# Patient Record
Sex: Female | Born: 1945 | Race: Black or African American | Hispanic: No | Smoking: Never smoker
Health system: Southern US, Community
[De-identification: ages and names within clinical notes are randomized; demographics above are authoritative.]

## PROBLEM LIST (undated history)

## (undated) DIAGNOSIS — I10 Essential (primary) hypertension: Secondary | ICD-10-CM

## (undated) DIAGNOSIS — E119 Type 2 diabetes mellitus without complications: Secondary | ICD-10-CM

## (undated) DIAGNOSIS — J383 Other diseases of vocal cords: Secondary | ICD-10-CM

## (undated) HISTORY — PX: NO PAST SURGERIES: SHX2092

---

## 1998-01-10 ENCOUNTER — Ambulatory Visit (HOSPITAL_COMMUNITY): Admission: RE | Admit: 1998-01-10 | Discharge: 1998-01-10 | Payer: Self-pay | Admitting: *Deleted

## 1998-01-14 ENCOUNTER — Ambulatory Visit (HOSPITAL_COMMUNITY): Admission: RE | Admit: 1998-01-14 | Discharge: 1998-01-14 | Payer: Self-pay | Admitting: *Deleted

## 1998-01-30 ENCOUNTER — Emergency Department (HOSPITAL_COMMUNITY): Admission: EM | Admit: 1998-01-30 | Discharge: 1998-01-30 | Payer: Self-pay | Admitting: Internal Medicine

## 1998-07-16 ENCOUNTER — Ambulatory Visit (HOSPITAL_COMMUNITY): Admission: RE | Admit: 1998-07-16 | Discharge: 1998-07-16 | Payer: Self-pay | Admitting: *Deleted

## 1998-08-29 ENCOUNTER — Other Ambulatory Visit: Admission: RE | Admit: 1998-08-29 | Discharge: 1998-08-29 | Payer: Self-pay | Admitting: Obstetrics and Gynecology

## 1998-09-17 ENCOUNTER — Ambulatory Visit (HOSPITAL_COMMUNITY): Admission: RE | Admit: 1998-09-17 | Discharge: 1998-09-17 | Payer: Self-pay | Admitting: Obstetrics and Gynecology

## 1998-09-17 ENCOUNTER — Encounter: Payer: Self-pay | Admitting: Obstetrics and Gynecology

## 1998-10-10 ENCOUNTER — Ambulatory Visit (HOSPITAL_COMMUNITY): Admission: RE | Admit: 1998-10-10 | Discharge: 1998-10-10 | Payer: Self-pay | Admitting: Gastroenterology

## 1999-01-30 ENCOUNTER — Ambulatory Visit (HOSPITAL_COMMUNITY): Admission: RE | Admit: 1999-01-30 | Discharge: 1999-01-30 | Payer: Self-pay | Admitting: Obstetrics and Gynecology

## 1999-05-28 ENCOUNTER — Encounter: Payer: Self-pay | Admitting: Obstetrics and Gynecology

## 1999-05-28 ENCOUNTER — Ambulatory Visit (HOSPITAL_COMMUNITY): Admission: RE | Admit: 1999-05-28 | Discharge: 1999-05-28 | Payer: Self-pay | Admitting: Obstetrics and Gynecology

## 1999-09-04 ENCOUNTER — Other Ambulatory Visit: Admission: RE | Admit: 1999-09-04 | Discharge: 1999-09-04 | Payer: Self-pay | Admitting: Obstetrics and Gynecology

## 2000-06-02 ENCOUNTER — Ambulatory Visit (HOSPITAL_COMMUNITY): Admission: RE | Admit: 2000-06-02 | Discharge: 2000-06-02 | Payer: Self-pay | Admitting: Obstetrics and Gynecology

## 2000-06-02 ENCOUNTER — Encounter: Payer: Self-pay | Admitting: Obstetrics and Gynecology

## 2000-09-22 ENCOUNTER — Other Ambulatory Visit: Admission: RE | Admit: 2000-09-22 | Discharge: 2000-09-22 | Payer: Self-pay | Admitting: Obstetrics and Gynecology

## 2001-06-08 ENCOUNTER — Ambulatory Visit (HOSPITAL_COMMUNITY): Admission: RE | Admit: 2001-06-08 | Discharge: 2001-06-08 | Payer: Self-pay | Admitting: Obstetrics and Gynecology

## 2001-06-08 ENCOUNTER — Encounter: Payer: Self-pay | Admitting: Obstetrics and Gynecology

## 2001-09-26 ENCOUNTER — Other Ambulatory Visit: Admission: RE | Admit: 2001-09-26 | Discharge: 2001-09-26 | Payer: Self-pay | Admitting: Obstetrics and Gynecology

## 2001-11-23 ENCOUNTER — Ambulatory Visit (HOSPITAL_COMMUNITY): Admission: RE | Admit: 2001-11-23 | Discharge: 2001-11-23 | Payer: Self-pay | Admitting: Gastroenterology

## 2002-06-14 ENCOUNTER — Ambulatory Visit (HOSPITAL_COMMUNITY): Admission: RE | Admit: 2002-06-14 | Discharge: 2002-06-14 | Payer: Self-pay | Admitting: Obstetrics and Gynecology

## 2002-06-14 ENCOUNTER — Encounter: Payer: Self-pay | Admitting: Obstetrics and Gynecology

## 2002-10-18 ENCOUNTER — Other Ambulatory Visit: Admission: RE | Admit: 2002-10-18 | Discharge: 2002-10-18 | Payer: Self-pay | Admitting: Obstetrics and Gynecology

## 2003-03-05 ENCOUNTER — Encounter: Payer: Self-pay | Admitting: Emergency Medicine

## 2003-03-05 ENCOUNTER — Emergency Department (HOSPITAL_COMMUNITY): Admission: EM | Admit: 2003-03-05 | Discharge: 2003-03-05 | Payer: Self-pay | Admitting: Emergency Medicine

## 2003-06-20 ENCOUNTER — Encounter: Admission: RE | Admit: 2003-06-20 | Discharge: 2003-06-20 | Payer: Self-pay | Admitting: Obstetrics and Gynecology

## 2003-06-27 ENCOUNTER — Ambulatory Visit (HOSPITAL_COMMUNITY): Admission: RE | Admit: 2003-06-27 | Discharge: 2003-06-27 | Payer: Self-pay | Admitting: Obstetrics and Gynecology

## 2003-07-11 ENCOUNTER — Ambulatory Visit (HOSPITAL_COMMUNITY): Admission: RE | Admit: 2003-07-11 | Discharge: 2003-07-11 | Payer: Self-pay | Admitting: Obstetrics and Gynecology

## 2003-07-17 ENCOUNTER — Ambulatory Visit: Admission: RE | Admit: 2003-07-17 | Discharge: 2003-07-17 | Payer: Self-pay | Admitting: Gynecology

## 2003-08-15 ENCOUNTER — Inpatient Hospital Stay (HOSPITAL_COMMUNITY): Admission: RE | Admit: 2003-08-15 | Discharge: 2003-08-17 | Payer: Self-pay | Admitting: Obstetrics and Gynecology

## 2003-08-15 ENCOUNTER — Encounter (INDEPENDENT_AMBULATORY_CARE_PROVIDER_SITE_OTHER): Payer: Self-pay | Admitting: Specialist

## 2004-07-02 ENCOUNTER — Ambulatory Visit (HOSPITAL_COMMUNITY): Admission: RE | Admit: 2004-07-02 | Discharge: 2004-07-02 | Payer: Self-pay | Admitting: Obstetrics and Gynecology

## 2005-02-25 ENCOUNTER — Other Ambulatory Visit: Admission: RE | Admit: 2005-02-25 | Discharge: 2005-02-25 | Payer: Self-pay | Admitting: Family Medicine

## 2005-07-15 ENCOUNTER — Ambulatory Visit (HOSPITAL_COMMUNITY): Admission: RE | Admit: 2005-07-15 | Discharge: 2005-07-15 | Payer: Self-pay | Admitting: Family Medicine

## 2006-07-21 ENCOUNTER — Ambulatory Visit (HOSPITAL_COMMUNITY): Admission: RE | Admit: 2006-07-21 | Discharge: 2006-07-21 | Payer: Self-pay | Admitting: Family Medicine

## 2006-12-17 ENCOUNTER — Ambulatory Visit (HOSPITAL_COMMUNITY): Admission: RE | Admit: 2006-12-17 | Discharge: 2006-12-17 | Payer: Self-pay | Admitting: Gastroenterology

## 2006-12-17 ENCOUNTER — Encounter (INDEPENDENT_AMBULATORY_CARE_PROVIDER_SITE_OTHER): Payer: Self-pay | Admitting: Gastroenterology

## 2007-02-02 ENCOUNTER — Ambulatory Visit (HOSPITAL_COMMUNITY): Admission: RE | Admit: 2007-02-02 | Discharge: 2007-02-02 | Payer: Self-pay | Admitting: Gastroenterology

## 2007-06-20 ENCOUNTER — Ambulatory Visit (HOSPITAL_BASED_OUTPATIENT_CLINIC_OR_DEPARTMENT_OTHER): Admission: RE | Admit: 2007-06-20 | Discharge: 2007-06-20 | Payer: Self-pay | Admitting: Otolaryngology

## 2007-06-20 ENCOUNTER — Encounter (INDEPENDENT_AMBULATORY_CARE_PROVIDER_SITE_OTHER): Payer: Self-pay | Admitting: Otolaryngology

## 2007-07-20 ENCOUNTER — Encounter: Admission: RE | Admit: 2007-07-20 | Discharge: 2007-07-26 | Payer: Self-pay | Admitting: Otolaryngology

## 2007-07-27 ENCOUNTER — Ambulatory Visit (HOSPITAL_COMMUNITY): Admission: RE | Admit: 2007-07-27 | Discharge: 2007-07-27 | Payer: Self-pay | Admitting: Family Medicine

## 2008-04-25 ENCOUNTER — Other Ambulatory Visit: Admission: RE | Admit: 2008-04-25 | Discharge: 2008-04-25 | Payer: Self-pay | Admitting: Family Medicine

## 2008-08-01 ENCOUNTER — Ambulatory Visit (HOSPITAL_COMMUNITY): Admission: RE | Admit: 2008-08-01 | Discharge: 2008-08-01 | Payer: Self-pay | Admitting: Family Medicine

## 2009-08-20 ENCOUNTER — Ambulatory Visit (HOSPITAL_COMMUNITY): Admission: RE | Admit: 2009-08-20 | Discharge: 2009-08-20 | Payer: Self-pay | Admitting: Family Medicine

## 2010-07-18 ENCOUNTER — Other Ambulatory Visit (HOSPITAL_COMMUNITY): Payer: Self-pay | Admitting: Family Medicine

## 2010-07-18 DIAGNOSIS — Z1231 Encounter for screening mammogram for malignant neoplasm of breast: Secondary | ICD-10-CM

## 2010-07-18 DIAGNOSIS — Z0000 Encounter for general adult medical examination without abnormal findings: Secondary | ICD-10-CM

## 2010-09-01 ENCOUNTER — Ambulatory Visit (HOSPITAL_COMMUNITY): Admission: RE | Admit: 2010-09-01 | Payer: Self-pay | Source: Ambulatory Visit

## 2010-09-23 ENCOUNTER — Ambulatory Visit (HOSPITAL_COMMUNITY)
Admission: RE | Admit: 2010-09-23 | Discharge: 2010-09-23 | Disposition: A | Discharge status: Home / Self Care | Payer: Medicare Other | Source: Ambulatory Visit | Attending: Family Medicine | Admitting: Family Medicine

## 2010-09-23 DIAGNOSIS — Z1231 Encounter for screening mammogram for malignant neoplasm of breast: Secondary | ICD-10-CM | POA: Insufficient documentation

## 2010-11-11 NOTE — Op Note (Signed)
Tracy Fisher, Tracy Fisher               ACCOUNT NO.:  [id number]   MEDICAL RECORD NO.:  [id number]          PATIENT TYPE:  AMB   LOCATION:  DSC                          FACILITY:  [facility]   PHYSICIAN:  Antony Contras, MD     DATE OF BIRTH:  1946-02-08   DATE OF PROCEDURE:  06/20/2007  DATE OF DISCHARGE:  06/20/2007                               OPERATIVE REPORT   PREOPERATIVE DIAGNOSES:  1. Hoarseness.  2. Left vocal fold cyst.   POSTOPERATIVE DIAGNOSES:  1. Hoarseness.  2. Left vocal fold cyst.   PROCEDURE:  Suspended micro direct laryngoscopy with a micro flap  excision of left vocal fold cyst.   SURGEON:  Dr. Christia Reading.   ANESTHESIA:  General endotracheal anesthesia.   COMPLICATIONS:  None.   INDICATIONS:  The patient is a 65 year old African-American female who  has had a hoarse voice for a couple of years.  She has been treated with  antireflux therapy with minimal improvement.  She recently underwent  stroboscopy that defined a left vocal fold cyst.  She presents to the  operating room for surgical management.   FINDINGS:  There is a lump within the left vocal fold in the midportion  of the striking zone and inferior to this.  There is a little bit of  edema of the opposite vocal fold.  However, no lump in the right vocal  fold.  Upon micro flap dissection, the cyst was unable to be freed from  the overlying mucosa, and that mucosa was removed along with the cyst.   DESCRIPTION OF PROCEDURE:  The patient was identified in the holding  room, and informed consent having been obtained including discussion of  risks, benefits, and alternatives, the patient was brought to the  operative suite and put on the operative table in supine position.  Anesthesia was induced, and the patient was intubated by anesthesia team  without difficulty after spraying with topical lidocaine.  With the eyes  draped closed, the bed was turned 90 degrees from anesthesia.  A head  wrap was placed  around the patient's head, and a tooth guard was placed.  The larynx was then exposed using a Dedo laryngoscope was placed in a  supraglottic position.  The laryngoscope was placed in suspension on a  Mayo stand.  The patient was given intravenous antibiotics and steroids  during the case.  A 0-degree telescope then used to document the larynx.  An epinephrine soaked pledget was held against the left vocal fold for a  minute or so and then removed.  A incision was then made along on the  left vocal fold over the central portion of the vocal fold on its  superior surface.  Dissection was then performed with a blunt probe  underneath the mucosa toward the cyst.  Attempts were made to separate  the cyst from the overlying mucosa, but this was impossible.  The cyst  was separated from the underlying lamina propria with blunt dissection.  Scissors were used to attempt to separate the cyst from the overlying  mucosa but resulted in another  incision through the mucosa against the  cyst.  The cyst was then retracted medially while the incision was then  extended around the cyst through the mucosa removing the cyst.  The cyst  was then passed to nursing for pathology.  An epinephrine-soaked pledget  was held again against the larynx for a minute or so and then removed.  The probe was then used to palpate the larynx, and no additional lump  was felt.  A 0-degree telescope was again used to document the larynx.  After this, the laryngoscope was taken out of suspension and removed  from the patient's mouth while the airway was suctioned.  The patient  was then turned back to anesthesia for waking and was extubated and  moved to the recovery room in stable condition.      Antony Contras, MD  Electronically Signed     DDB/MEDQ  D:  06/20/2007  T:  06/21/2007  Job:  [id number]

## 2010-11-11 NOTE — Op Note (Signed)
NAME:  Tracy Fisher, Tracy Fisher               ACCOUNT NO.:  [id number]   MEDICAL RECORD NO.:  [id number]          PATIENT TYPE:  AMB   LOCATION:  ENDO                         FACILITY:  [facility]   PHYSICIAN:  Anselmo Rod, M.D.  DATE OF BIRTH:  [birth year]   DATE OF PROCEDURE:  12/17/2006  DATE OF DISCHARGE:                               OPERATIVE REPORT   PROCEDURE PERFORMED:  Esophagogastroduodenoscopy with gastric biopsies  x3.   ENDOSCOPIST:  Anselmo Rod, MD   INSTRUMENT USED:  Pentax video panendoscope.   INDICATIONS FOR PROCEDURE:  [age]-year-old African American female  with a history of hoarseness and reflux, undergoing EGD to rule out  esophagitis, peptic ulcer disease, etc.   PREPROCEDURE PREPARATION:  Informed consent was procured from the  patient.  The patient fasted for 8 hours prior to the procedure.  The  risks and benefits of the procedure were discussed with the patient in  great detail.   PREPROCEDURE PHYSICAL:  VITAL SIGNS:  The patient had stable vital  signs.  NECK:  Supple.  CHEST:  Clear to auscultation.  CARDIAC:  S1 and S2 regular.  ABDOMEN:  Soft with normal bowel sounds.   DESCRIPTION OF THE PROCEDURE:  The patient was placed in the left  lateral decubitus position and sedated with 25 mcg of Fentanyl and 2.5  mg of Versed in addition to the sedation she received for the  colonoscopy. Once the patient was adequately sedated and maintained on  low-flow oxygen and continuous cardiac monitoring, the Pentax video  panendoscope was advanced through the mouthpiece, over the tongue and  into the esophagus under direct vision.  The entire esophagus was widely  patent with no evidence of ring, stricture, mass, esophagitis or  Barrett's mucosa. The Z-line appeared healthy. The scope was then  advanced into the stomach.  Mild antral gastritis was noted.  Biopsies  were done to rule out presence of H. pylori by Pathology.  A small  sessile polyp was biopsied  below the Z-line.  The proximal small bowel  appeared normal.  There was no obstruction.  The patient tolerated the  procedure well without complications.   IMPRESSION:  1. Widely patent esophagus with no evidence of esophagitis or      Barrett's mucosa, healthy Z-line.  2. Small polyp biopsied below the Z-line.  3. Mild antral gastritis, biopsies done for Helicobacter pylori.  4. Normal proximal small bowel.   RECOMMENDATIONS:  1. Await pathology results.  2. Avoid all nonsteroidals including aspirin for the next 4 weeks.  3. Continue PPIs.  4. Outpatient followup as need arises in the future.      Anselmo Rod, M.D.  Electronically Signed     JNM/MEDQ  D:  12/17/2006  T:  12/17/2006  Job:  [id number]   cc:   Stacie Acres. Cliffton Asters, M.D.  Fax: [phone]   Antony Contras, MD  Fax: [phone]

## 2010-11-11 NOTE — Op Note (Signed)
NAME:  Tracy Fisher, Tracy Fisher               ACCOUNT NO.:  [id number]   MEDICAL RECORD NO.:  [id number]          PATIENT TYPE:  AMB   LOCATION:  ENDO                         [facility]ACILITY:  [facility]   PHYSICIAN:  Anselmo Rod, M.D.  DATE OF BIRTH:  May 25, 1946   DATE OF PROCEDURE:  12/17/2006  DATE OF DISCHARGE:                               OPERATIVE REPORT   PROCEDURE PERFORMED:  Colonoscopy with cold biopsies times four.   ENDOSCOPIST:  Anselmo Rod, M.D.   INSTRUMENT USED:  Pentax video colonoscope.   INDICATION FOR PROCEDURE:  A 65 year old African-American female with a  history of adenomatous polyps removed in the past, undergoing a  surveillance colonoscopy to rule out recurrent polyps, masses, etc.   PREPROCEDURE PREPARATION:  Informed consent was procured from the  patient.  The patient fasted for 8 hours prior to the procedure and  prepped with a bottle of magnesium citrate and a gallon of NuLYTELY the  night prior to the procedure.  Risks and benefits of the procedure,  including a 10% miss rate of cancer and polyp, were discussed with the  patient as well.   PREPROCEDURE PHYSICAL:  The patient had stable vital signs.  NECK:  Supple.  CHEST:  Clear to auscultation.  S1, S2 regular.  ABDOMEN:  Soft with normal bowel sounds.   DESCRIPTION OF THE PROCEDURE:  The patient was placed in the left  lateral decubitus position, sedated with 75 mcg of Fentanyl, 8 mg of  Versed given intravenously in slow incremental doses.  Once the patient  was adequately sedated and maintained on low flow oxygen and continuous  cardiac monitoring the Pentax video colonoscope was advanced from the  rectum to the cecum.  There was some residual stool in the right colon,  multiple washings were done.  Small lesions could be missed.  The  appendiceal orifice and ileocecal valve were visualized.  After changing  the patient's position from the left lateral to the supine position, the  terminal ileum was  briefly visualized and appeared normal.  There was  patchy erythema noted in the rectum and rectosigmoid colon, biopsies  were done to rule out proctitis; the exact reason for this erythema is  not clear to me.  Retroflexion of the rectum revealed erythema, as  mentioned above, but no evidence of hemorrhoids.  The patient tolerated  the procedure well without complications, no masses or polyps were seen,  there was no evidence of diverticulosis.   IMPRESSION:  1. Patchy erythema noted in the rectum and rectosigmoid colon biopsy      done to rule out proctitis, otherwise normal exam to the terminal      ileum.  2. Large amount of residual stool especially in the right colon;      multiple washings done;, small lesion could be missed.   RECOMMENDATIONS:  1. Await pathology results.  2. Avoid all nonsteroidals, including Aspirin, for the next 4 weeks.  3. Repeat colonoscopy depending on pathology results.  4. Proceed with EGD at this time.      Anselmo Rod, M.D.  Electronically Signed  JNM/MEDQ  D:  12/17/2006  T:  12/17/2006  Job:  [id number]   cc:   Stacie Acres. Cliffton Asters, M.D.  Fax: [phone]

## 2010-11-14 NOTE — H&P (Signed)
NAME:  Tracy Fisher, Tracy Fisher                         ACCOUNT NO.:  [id number]   MEDICAL RECORD NO.:  [id number]                   PATIENT TYPE:  INP   LOCATION:  NA                                   FACILITY:  [facility]   PHYSICIAN:  Maxie Better, M.D.            DATE OF BIRTH:  [birth year]   DATE OF ADMISSION:  08/15/2003  DATE OF DISCHARGE:                                HISTORY & PHYSICAL   CHIEF COMPLAINT:  Abdominal soreness, uterine fibroids, for total abdominal  hysterectomy and bilateral salpingo-oophorectomy.   HISTORY OF PRESENT ILLNESS:  This is a 65 year old gravida 2, para 2,  postmenopausal white female, now being admitted for total abdominal  hysterectomy, bilateral salpingo-oophorectomy.  The patient presented with  complaint of inability to do situps due to her abdomen and lower abdominal  soreness.  She had no associated nausea, vomiting, or fever.  She had had a  colonoscopy in 2003 that was normal.  The patient felt something was wrong,  and this was in December of 2004.  At that time, she underwent an ultrasound  previously in April of 2004, which showed multiple uterine fibroids, both  ovaries were normal, and a thin endometrial stripe.  The patient was offered  an abdominopelvic CT scan which she underwent in December of 2004 for  persistent symptoms.  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  Multiple dense lesions in the liver, consistent with  cysts, and MRI of the pelvis was recommended.  The patient was referred to  Dr. Stanford Breed, who evaluated her concerns, as well as review of her  studies.  Without the possibility of cancer, however, the cause of the  patient's symptoms is unknown, and the patient wants to proceed with removal  of her pelvic organs.  The patient now, therefore, presents for surgical  management.   ALLERGIES:  To SULFA.   MEDICATIONS:   None.   MEDICAL HISTORY:  Prior history of hypercholesterolemia, history of  adenomatous colonic polyp in 2000.   SURGICAL HISTORY:  D & C hysteroscopy, endometrial polypectomy in 2000,  postpartum tubal ligation in October of 1979.   OBSTETRICAL HISTORY:  Vaginal delivery x2.   FAMILY HISTORY:  Bladder cancer in father, diabetes in mother.  Mother also  had thyroid dysfunction.  No colon or ovarian cancer in the family.   SOCIAL HISTORY:  Nonsmoker.  Two children. Works at [employer]   REVIEW OF SYSTEMS:  Negative except as noted in the history of present  illness.   PHYSICAL EXAMINATION:  GENERAL:  Well-developed, well-nourished black female  in no acute distress.  VITAL SIGNS:  Blood pressure 112/70, temperature 97.8, weight is 155-1/2  pounds.  SKIN:  Shows no lesions.  HEENT:  Anicteric sclerae. Pink conjunctivae.  Oropharynx negative.  HEART:  Regular rate and rhythm without  murmur.  LUNGS:  Clear to auscultation.  NECK:  Supple. No thyroid palpable.  No axillary, supraclavicular, or  inguinal nodes palpable.  BREASTS:  Soft and nontender.  No palpable mass. Nipples without discharge.  ABDOMEN:  Soft, small striae, nontender.  Slightly obese.  PELVIC:  Vulva shows no lesions.  Vagina:  There are some slight atrophic  changes.  Pale mucosa.  Cervix is closed.  Uterus is anteverted, about 7  weeks size.  Adnexa:  nontender.  No palpable mass.  EXTREMITIES:  Positive varicosities, no calf tenderness, no edema.   IMPRESSION:  Lower abdominal pain of unclear etiology.  Uterine fibroids.   PLAN:  Exploratory laparotomy, total abdominal hysterectomy, bilateral  salpingo-oophorectomy with Dr. Stanford Breed on standby.  Bowel prep.  Antibiotic prophylaxis.  Antiembolic stockings.  The risks of the procedure  have been explained to the patient, including but not limited to infection,  bleeding which may require transfusion, injury to surrounding organs and   structures such as the bladder, bowel, and ureter, internal scar tissues,  and a fissure which may result in bowel obstruction, pelvic pain, fistula  formation, possibility that her discomfort is not relieved by the surgery.  All questions answered, and the patient is prepared for any surgical staging  if necessary by Dr. Stanford Breed.                                               Maxie Better, M.D.    [place]/MEDQ  D:  08/14/2003  T:  08/14/2003  Job:  [id number]

## 2010-11-14 NOTE — Consult Note (Signed)
NAME:  Tracy Fisher, Tracy Fisher                         ACCOUNT NO.:  [id number]   MEDICAL RECORD NO.:  [id number]                   PATIENT TYPE:  OUT   LOCATION:  GYN                                  FACILITY:  [facility]   PHYSICIAN:  De Blanch, M.D.         DATE OF BIRTH:  17-Nov-1945   DATE OF CONSULTATION:  07/17/2003  DATE OF DISCHARGE:                                   CONSULTATION   REASON FOR CONSULTATION:  A 65 year old African-American female seen at the  request of Maxie Better, M.D. regarding possible symptomatic uterine  fibroids.   The patient apparently has been followed for a number of years initially by  Dr. Tresa Endo __________ and more recently by Maxie Better, M.D.  for  uterine fibroids. She has had serial ultrasounds, the most recent one of  which was in December of 2004 which showed stable to somewhat smaller  uterine fibroids as measured by ultrasound.  Her ovaries were normal.  There  is no evidence of free fluid.  The patient, however, had been developing  some abdominal symptoms which are somewhat vague and predominantly left  sided discomfort.  Because of that, she underwent a CT scan in which the  radiologist opined that there may be some involvement of small bowel by a  pelvic neoplastic process.  Subsequently an MRI was obtained which showed a  fibroid uterus but no other abnormalities and no suggestion of involvement  of the bowel.   The patient herself denies any GI symptoms.  She specifically denies any  nausea or vomiting, any weight loss, or any change in bowel habits.  The  pain that she describes is somewhat vague and on the left side but she feels  that something is wrong.  She denies any vaginal bleeding or discharge or  any other gynecologic symptoms.   PAST MEDICAL HISTORY:  Medical illnesses none. The patient's previously had  a colon polyp removed.   PAST SURGICAL HISTORY:  Removal of polyp from uterus.   MEDICATIONS:   Multivitamins.  The patient discontinued taking Prempro in  March of 2004.   FAMILY HISTORY:  Negative for breast, colon or ovarian cancer.   OBSTETRICAL HISTORY:  Gravida 2.   SOCIAL HISTORY:  The patient is divorced. 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 She does not smoke.   REVIEW OF SYMPTOMS:  Otherwise negative.   PHYSICAL EXAMINATION:  VITAL SIGNS:  Height 5 foot 3, weight 151 pounds,  blood pressure 152/80, pulse 88, respirations 18.  GENERAL:  The patient is a pleasant, healthy, African-American female in no  acute distress.  HEENT:  Negative.  NECK:  Supple without thyromegaly. There was no supraclavicular or inguinal  adenopathy.  ABDOMEN:  Soft, nontender, no mass, organomegaly, ascites or hernias are  noted.  PELVIC:  EGBUS, vagina, bladder, urethra are normal. Cervix appears normal.  The uterus is somewhat irregular but only  approximately [redacted] weeks gestational  size. No adnexal masses are noted. Rectovaginal exam confirms.   LABORATORY DATA:  The patient's records from Maxie Better, M.D. office  are reviewed including ultrasound, MRI and CT scan. I have also personally  reviewed the MRI and CT scan.   IMPRESSION:  The patient has apparently relatively stable uterine fibroids  but new abdominal symptoms.  The CT scan suggested the possibility of small  bowel involvement although I think this is highly unlikely given her current  symptoms, well being and overall functional status as well as finding an MRI  which does not suggest any small bowel involvement.   Given the patient is symptomatic and that this is possibly related to her  fibroids, I would agree with Maxie Better, M.D. recommendations that  she undergo total abdominal hysterectomy and bilateral salpingo-oophorectomy  and exploratory laparotomy at the same time.  I seriously doubt that this is  a malignant process and therefore indicated I thought it would  be reasonable  for Maxie Better, M.D. to proceed with surgery. I would be happy to be  on standby.  I discussed all of this with the patient and she is in  agreement as well.                                               De Blanch, M.D.    DC/MEDQ  D:  07/17/2003  T:  07/18/2003  Job:  [id number]   cc:   Maxie Better, M.D.  [address]  [address]  [place]  [place] [address]  Fax: [phone]   Telford Nab, R.N.  [phone] [address]  [place], [place] [address]

## 2010-11-14 NOTE — Op Note (Signed)
NAME:  Tracy Fisher, Tracy Fisher                         ACCOUNT NO.:  [id number]   MEDICAL RECORD NO.:  [id number]                   PATIENT TYPE:  INP   LOCATION:  9316                                 FACILITY:  [facility]   PHYSICIAN:  Maxie Better, M.D.            DATE OF BIRTH:  Aug 20, 1945   DATE OF PROCEDURE:  08/16/2003  DATE OF DISCHARGE:                                 OPERATIVE REPORT   PREOPERATIVE DIAGNOSES:  1. Abdominal pain/pelvic pain.  2. Uterine fibroids.   PROCEDURES:  1. Exploratory laparotomy.  2. Peritoneal washings.  3. Total abdominal hysterectomy, bilateral salpingo-oophorectomy with frozen     section.   POSTOPERATIVE DIAGNOSES:  1. Abdominal pain/pelvic pain.  2. Uterine fibroids.  3. Pending final pathology.   ANESTHESIA:  General.   SURGEON:  Maxie Better, M.D.   ASSISTANT:  Richardean Sale, M.D.   INDICATIONS:  This is a 65 year old para 2 postmenopausal female with known  uterine fibroids, with unspecified lower abdominal/pelvic pain with an  abnormal abdominopelvic CT scan, who now presents for total abdominal  hysterectomy, bilateral salpingo-oophorectomy after consultation with Dr.  Stanford Breed regarding the findings.  Risks and benefits of the procedure  have been explained to the patient, consent was signed, and the patient was  transferred to the operating room.   DESCRIPTION OF PROCEDURE:  Under adequate general anesthesia, the patient  was placed in the supine position.  She was sterilely prepped and draped in  the usual fashion.  An indwelling Foley catheter was placed sterilely.  Marcaine 0.25% 10 mL was injected along the planned vertical skin incision.  A vertical skin incision was made just below the umbilicus to the symphysis  pubis and carried down to the rectus fascia using a scalpel.  The rectus  fascia was incised in the midline, extended superiorly and inferiorly.  The  rectus muscle was already split in the midline and the  parietal peritoneum  was carefully opened and extended superiorly and inferiorly.  Ringer's  lactate 500 mL solution was instilled in the abdomen and 400 mL was  suctioned out for peritoneal washings.  The bowels were then packed  upwardly.  A self-retaining Balfour retractor was then placed.  Attention  was then turned to the pelvis.  Inspection of the pelvis was notable for  atrophic but otherwise normal ovaries, normal-appearing tubes, an elongated  appendix but otherwise normal, small amount of adhesions, filmy, to the  right posterior aspect of the uterus close toward the ovarian fossa on the  right.  The uterus was small with multiple fibroids, one was on the right  fundal aspect and the other was posterior at the level of the uterine vessel  or the lower uterine segment posteriorly on the left.  The round ligaments  were bilaterally identified, suture ligated, and severed using cautery.  The  anterior leaf of the broad ligament was then opened transversely to then  sharply and  bluntly displace the bladder inferiorly.  The retroperitoneal  space was opened bilaterally.  The ureters were identified bilaterally.  The  uterine vessels were isolated and doubly clamped, cut, and ligated x2 on the  right as well as on the left.  The adhesions on the right in the posterior  aspect of the uterus were lysed.  In identifying the right ureter and  isolating the right ovarian vessel, the appendix was noted to be attached  with filmy adhesions to that medial aspect of the peritoneum on the right  just adjacent to the course of the right ureter.  The appendix was carefully  lifted and the adhesions lysed, freeing up the appendix.  The uterine  vessels on the right were then skeletonized, doubly clamped, cut, and suture  ligated with 0 Vicryl x2.  The cardinal ligaments were then serially  clamped, cut, and suture ligated with 0 Vicryl until the cervicovaginal  junction was reached on the right.   The uterosacral ligaments were very  attenuated and small, and they were nonetheless clamped, cut, and separately  suture ligated with 0 Vicryl suture.  Attention was then turned to the left  side, where the uterine vessel was then skeletonized.  The approach to the  uterine vessel on the left was limited by the posterior fibroid at the lower  uterine segment.  Nonetheless, that area was carefully clamped, cut, and  then suture ligated with 0 Vicryl suture.  The pedicle was then noted to be  bleeding and re-application of the clamp medial to the pedicle double  fashioned then allowed for the uterine vessels to be isolated on the left.  They were then cut and suture ligated with 0 Vicryl x2.  The cardinal  ligaments were then serially clamped, cut, and suture ligated with 0 Vicryl  until the cervicovaginal junction was then reached.  The vaginal cuff was  then clamped across using curved Heaney sutures.  The cervix was then  subsequently severed from its vaginal attachment and the angle sutures were  then suture ligated with 0 Vicryl.  The vaginal cuff was then reefed with 0  Vicryl running locked stitch and then closed in the midline of the cuff  using a 0 Vicryl interrupted figure-of-eight suture.  Small bleeder on the  right lateral aspect of what would be considered the bladder pillars was  carefully clamped and suture ligated with 3-0 Vicryl suture.  Other bleeders  were cauterized.  There was a prominent blood vessel on the anterior aspect  of the cuff just below the bladder reflection, and this was clamped and free-  tied with 3-0 Vicryl suture.  The specimen, which was the uterus, tubes, and  ovaries, was sent for frozen section.  Close inspection on recall from the  pathology was consistent with benign-appearing fibroids.  The abdomen was  then irrigated and suctioned.  The pedicles and peritoneal surfaces were  then carefully inspected sequentially, small bleeders cauterized, and  good hemostasis was then noted to be present.  The packings were removed.  The  abdomen, which had been inspected at the beginning of the case, had shown  normal palpable liver edge and kidneys.  The self-retaining retractor had  been removed.  The omentum was inspected and the undersurface of the rectus  fascia had a small bleeder, and this was cauterized.  The parietal  peritoneum was not closed.  The rectus fascia was closed with 0 Vicryl x2,  the subcutaneous area was irrigated and suctioned,  small bleeders  cauterized.  The skin was approximated using Ethicon staples.  Specimen was  the uterus with tubes and ovaries.  Estimated blood loss was 350 mL.  Intraoperative fluid was 3600 mL crystalloid.  Urine output was 500 mL clear  yellow urine.  Sponge and instrument count x3 was correct.  Complication was  none.  The patient tolerated the procedure well, was transferred to the  recovery room in stable condition.                                               Maxie Better, M.D.    [place]/MEDQ  D:  08/15/2003  T:  08/16/2003  Job:  [id number]

## 2010-11-14 NOTE — Discharge Summary (Signed)
NAME:  Tracy Fisher, Tracy Fisher                         ACCOUNT NO.:  [id number]   MEDICAL RECORD NO.:  [id number]                   PATIENT TYPE:  INP   LOCATION:  9316                                 FACILITY:  [facility]   PHYSICIAN:  Maxie Better, M.D.            DATE OF BIRTH:  Sep 28, 1945   DATE OF ADMISSION:  08/15/2003  DATE OF DISCHARGE:  08/17/2003                                 DISCHARGE SUMMARY   ADMISSION DIAGNOSES:  1. Abdominal/pelvic pain.  2. Uterine fibroids.   DISCHARGE DIAGNOSES:  1. Abdominal pain/pelvic pain.  2. Multiple uterine fibroids.   PROCEDURE:  Exploratory laparotomy, total abdominal hysterectomy, bilateral  salpingo-oophorectomy, peritoneal washings.   HISTORY OF PRESENT ILLNESS:  This is a 65 year old gravida 2 para 2  postmenopausal black female admitted for total abdominal hysterectomy and  bilateral salpingo-oophorectomy secondary to abdominal/pelvic pain with a  question of uterine cancer on abdominopelvic CT scan.   HOSPITAL COURSE:  The patient was admitted to [facility].  She was  taken to the operating room where she underwent exploratory laparotomy,  total abdominal hysterectomy with bilateral salpingo-oophorectomy, pelvic  washings, with frozen section.  Dr. Stanford Breed was on standby in the  event that this pathology was consistent with cancer.  The patient had an  uncomplicated postoperative course.  Her temperature maximum postoperative  day #1 was 100.3 which was attributed to atelectasis.  A CBC on  postoperative day #1 showed a hemoglobin of 9.8, hematocrit of 29.1, white  count of 3.5.  Her preoperative hemoglobin was 12.7.  The patient was  tolerating a regular diet, passing flatus, and had had a bowel movement by  postoperative day #2.  She was deemed well to be discharged home.  Her  incision was a vertical skin incision with staples.  No erythema,  induration, or exudate noted.  Final pathology showed multiple fibroids,  normal ovaries and tubes, with prior tubal ligation.   DISPOSITION:  Home.   CONDITION:  Stable.   DISCHARGE MEDICATIONS:  1. Tylox one to two tablets q.3-4h. p.r.n. pain.  2. Motrin 800 mg one p.o. q.6-8h. p.r.n. pain.  3. Over-the-counter Colace.   FOLLOW-UP APPOINTMENT:  Wednesday at 10 a.m. at [facility] for staple  removal and follow-up GYN postoperative at 5 weeks.   DISCHARGE INSTRUCTIONS:  1. Call for temperature greater than or equal to 100.4.  2. Nothing per vagina for 4-6 weeks.  3. No heavy lifting or driving for 2 weeks.  4. Call for severe abdominal pain, nausea or vomiting, incisional drainage,     redness, or increased incisional pain, vaginal bleeding.  5. No straining with her bowel movements.                                               Sheronette Cousins,  M.D.    [place]/MEDQ  D:  08/17/2003  T:  08/18/2003  Job:  [id number]

## 2010-11-14 NOTE — Op Note (Signed)
NAME:  Tracy Fisher, Tracy Fisher               ACCOUNT NO.:  [id number]   MEDICAL RECORD NO.:  [id number]          PATIENT TYPE:  AMB   LOCATION:  ENDO                         FACILITY:  [facility]   PHYSICIAN:  Anselmo Rod, M.D.  DATE OF BIRTH:  [birth year]   DATE OF PROCEDURE:  12/20/2006  DATE OF DISCHARGE:  12/17/2006                               OPERATIVE REPORT   PROCEDURE PERFORMED:  Colonoscopy with multiple cold biopsies.   ENDOSCOPIST:  Anselmo Rod, M.D.   INSTRUMENT USED:  Pentax video colonoscope.   INDICATIONS FOR PROCEDURE:  65 year old African American female with a  personal history of adenomatous polyps undergoing a surveillance  colonoscopy as a 5-year screening colonoscopy to rule out colonic  polyps, masses, etc.   PREPROCEDURE PREPARATION:  Informed consent was procured from the  patient.  The patient fasted for 8 hours prior to the procedure and  prepped with a bottle of magnesium citrate and gallon of NuLytely the  night prior to the procedure.  Risks and benefits of the procedure  including a 10% miss rate of cancer and polyp were discussed with the  patient as well.   PREPROCEDURE PHYSICAL:  The patient had stable vital signs.  Neck  supple.  Chest clear to auscultation.  S1, S2 regular.  Abdomen soft  with normal bowel sounds.   DESCRIPTION OF PROCEDURE:  The patient was placed in left lateral  decubitus position and sedated with 75 mcg of Fentanyl and 8 mg of  Versed given intravenously in slow incremental doses. Once the patient  was adequately sedated and maintained on low-flow oxygen and continuous  cardiac monitoring the Pentax video colonoscope was advanced from the  rectum to the cecum.  There was some stool in the colon.  Multiple  washes were done.  The appendiceal orifice and the cecal valve were  clearly visualized and photographed. Patchy erythema was biopsied from  the rectum to rule out proctitis. The rest of the exam was unremarkable.  There  was some solid stool in the right colon. Small lesions could be  missed. No evidence of diverticulosis. The patient tolerated the  procedure well without complications.   IMPRESSION:  1. Patchy erythema of unclear significance in the rectum.  Biopsies      done to rule out proctitis otherwise normal exam of the terminal      ileum.  2. Some residual stool in the colon.  Small lesions could be missed.   RECOMMENDATIONS:  1. Await pathology results.  2. Avoid all nonsteroidals including aspirin for the next 4 weeks.  3. Proceed with an EGD at this time.  Further recommendations made      thereafter.      Anselmo Rod, M.D.  Electronically Signed     JNM/MEDQ  D:  12/20/2006  T:  12/20/2006  Job:  [id number]   cc:   Stacie [place]. Cliffton Asters, M.D.  Fax: [phone]

## 2010-11-14 NOTE — Procedures (Signed)
[place]  Patient:    Tracy Fisher, Tracy Fisher. Visit Number: [id number] MRN: [id number]          Service Type: Attending:  Anselmo Rod, M.D. Dictated by:   Anselmo Rod, M.D. Proc. Date: 11/23/01   CC:         Sheronette A. Cherly Hensen, M.D.   Procedure Report  DATE OF BIRTH:  22-Oct-1945.  REFERRING PHYSICIAN:  Sheronette A. Cherly Hensen, M.D.  PROCEDURE PERFORMED:  Colonoscopy.  ENDOSCOPIST:  Anselmo Rod, M.D.  INSTRUMENT USED:  Olympus pediatric adjustable video colonoscope.  INDICATIONS FOR PROCEDURE:  The patient is a 65 year old African-American female with a personal history of polyps rule out recurrent polyps.  PREPROCEDURE PREPARATION:  Informed consent was procured from the patient. The patient was fasted for eight hours prior to the procedure and prepped with a bottle of magnesium citrate and a gallon of NuLytely the night prior to the procedure.  PREPROCEDURE PHYSICAL:  The patient had stable vital signs.  Neck supple. Chest clear to auscultation.  S1, S2 regular.  Abdomen soft with normal bowel sounds.  DESCRIPTION OF PROCEDURE:  The patient was placed in the left lateral decubitus position and sedated with 50 mg of Demerol and 5 mg of Versed intravenously.  Once the patient was adequately sedated and maintained on low-flow oxygen and continuous cardiac monitoring, the Olympus video colonoscope was advanced from the rectum to the cecum without difficulty.  The entire colonic mucosa appeared healthy with normal vascular pattern.  The appendiceal orifice and the ileocecal valve were clearly visualized and photographed.  No masses, polyps, ulcerations, erosions, or diverticula were seen. The patient tolerated the procedure well without complications.  IMPRESSION:  Normal colonoscopy.  RECOMMENDATIONS: 1. Repeat colorectal screening is recommended in the next five to 10 years    unless the patient develops any abnormal  symptoms in the interim. 2. Outpatient follow-up as need arises.Dictated by:   Anselmo Rod, M.D.  Attending:  Anselmo Rod, M.D. DD:  11/24/01 TD:  11/25/01 Job: [id number]

## 2011-04-03 LAB — POCT HEMOGLOBIN-HEMACUE
Hemoglobin: 14.1
Operator id: 123881

## 2011-08-17 ENCOUNTER — Other Ambulatory Visit (HOSPITAL_COMMUNITY): Payer: Self-pay | Admitting: Family Medicine

## 2011-08-17 DIAGNOSIS — Z1231 Encounter for screening mammogram for malignant neoplasm of breast: Secondary | ICD-10-CM

## 2011-09-23 ENCOUNTER — Other Ambulatory Visit: Payer: Self-pay | Admitting: Family Medicine

## 2011-09-23 DIAGNOSIS — N6459 Other signs and symptoms in breast: Secondary | ICD-10-CM

## 2011-09-25 ENCOUNTER — Ambulatory Visit (HOSPITAL_COMMUNITY): Payer: Medicare Other

## 2011-09-28 ENCOUNTER — Ambulatory Visit (HOSPITAL_COMMUNITY): Payer: Medicare Other

## 2011-10-01 ENCOUNTER — Ambulatory Visit
Admission: RE | Admit: 2011-10-01 | Discharge: 2011-10-01 | Disposition: A | Discharge status: Home / Self Care | Payer: Medicare Other | Source: Ambulatory Visit | Attending: Family Medicine | Admitting: Family Medicine

## 2011-10-01 DIAGNOSIS — N6459 Other signs and symptoms in breast: Secondary | ICD-10-CM

## 2011-11-20 ENCOUNTER — Other Ambulatory Visit: Payer: Self-pay | Admitting: Gastroenterology

## 2012-09-12 ENCOUNTER — Other Ambulatory Visit (HOSPITAL_COMMUNITY): Payer: Self-pay | Admitting: Family Medicine

## 2012-09-12 DIAGNOSIS — Z1231 Encounter for screening mammogram for malignant neoplasm of breast: Secondary | ICD-10-CM

## 2012-10-07 ENCOUNTER — Ambulatory Visit (HOSPITAL_COMMUNITY)
Admission: RE | Admit: 2012-10-07 | Discharge: 2012-10-07 | Disposition: A | Discharge status: Home / Self Care | Payer: Medicare Other | Source: Ambulatory Visit | Attending: Family Medicine | Admitting: Family Medicine

## 2012-10-07 DIAGNOSIS — Z1231 Encounter for screening mammogram for malignant neoplasm of breast: Secondary | ICD-10-CM | POA: Insufficient documentation

## 2013-09-18 ENCOUNTER — Other Ambulatory Visit (HOSPITAL_COMMUNITY): Payer: Self-pay | Admitting: Family Medicine

## 2013-09-18 DIAGNOSIS — Z1231 Encounter for screening mammogram for malignant neoplasm of breast: Secondary | ICD-10-CM

## 2013-11-07 ENCOUNTER — Ambulatory Visit (HOSPITAL_COMMUNITY)
Admission: RE | Admit: 2013-11-07 | Discharge: 2013-11-07 | Disposition: A | Discharge status: Home / Self Care | Payer: Medicare Other | Source: Ambulatory Visit | Attending: Family Medicine | Admitting: Family Medicine

## 2013-11-07 DIAGNOSIS — Z1231 Encounter for screening mammogram for malignant neoplasm of breast: Secondary | ICD-10-CM | POA: Insufficient documentation

## 2013-11-20 ENCOUNTER — Encounter (HOSPITAL_COMMUNITY): Payer: Self-pay | Admitting: Emergency Medicine

## 2013-11-20 ENCOUNTER — Emergency Department (HOSPITAL_COMMUNITY)
Admission: EM | Admit: 2013-11-20 | Discharge: 2013-11-20 | Disposition: A | Discharge status: Home / Self Care | Payer: Medicare Other | Attending: Emergency Medicine | Admitting: Emergency Medicine

## 2013-11-20 ENCOUNTER — Emergency Department (HOSPITAL_COMMUNITY): Payer: Medicare Other

## 2013-11-20 DIAGNOSIS — M25559 Pain in unspecified hip: Secondary | ICD-10-CM | POA: Insufficient documentation

## 2013-11-20 DIAGNOSIS — R195 Other fecal abnormalities: Secondary | ICD-10-CM

## 2013-11-20 DIAGNOSIS — Z79899 Other long term (current) drug therapy: Secondary | ICD-10-CM | POA: Insufficient documentation

## 2013-11-20 DIAGNOSIS — R1032 Left lower quadrant pain: Secondary | ICD-10-CM | POA: Insufficient documentation

## 2013-11-20 DIAGNOSIS — D72819 Decreased white blood cell count, unspecified: Secondary | ICD-10-CM | POA: Insufficient documentation

## 2013-11-20 DIAGNOSIS — R634 Abnormal weight loss: Secondary | ICD-10-CM | POA: Insufficient documentation

## 2013-11-20 DIAGNOSIS — I10 Essential (primary) hypertension: Secondary | ICD-10-CM | POA: Insufficient documentation

## 2013-11-20 DIAGNOSIS — K921 Melena: Secondary | ICD-10-CM | POA: Insufficient documentation

## 2013-11-20 DIAGNOSIS — R109 Unspecified abdominal pain: Secondary | ICD-10-CM

## 2013-11-20 DIAGNOSIS — Z7982 Long term (current) use of aspirin: Secondary | ICD-10-CM | POA: Insufficient documentation

## 2013-11-20 HISTORY — DX: Essential (primary) hypertension: I10

## 2013-11-20 LAB — CBC WITH DIFFERENTIAL/PLATELET
Basophils Absolute: 0 10*3/uL (ref 0.0–0.1)
Basophils Relative: 1 % (ref 0–1)
Eosinophils Absolute: 0 10*3/uL (ref 0.0–0.7)
Eosinophils Relative: 0 % (ref 0–5)
HCT: 35.8 % — ABNORMAL LOW (ref 36.0–46.0)
Hemoglobin: 12.3 g/dL (ref 12.0–15.0)
Lymphocytes Relative: 38 % (ref 12–46)
Lymphs Abs: 1.2 10*3/uL (ref 0.7–4.0)
MCH: 33.2 pg (ref 26.0–34.0)
MCHC: 34.4 g/dL (ref 30.0–36.0)
MCV: 96.5 fL (ref 78.0–100.0)
Monocytes Absolute: 0.3 10*3/uL (ref 0.1–1.0)
Monocytes Relative: 9 % (ref 3–12)
Neutro Abs: 1.7 10*3/uL (ref 1.7–7.7)
Neutrophils Relative %: 52 % (ref 43–77)
Platelets: 202 10*3/uL (ref 150–400)
RBC: 3.71 MIL/uL — ABNORMAL LOW (ref 3.87–5.11)
RDW: 13.6 % (ref 11.5–15.5)
WBC: 3.2 10*3/uL — ABNORMAL LOW (ref 4.0–10.5)

## 2013-11-20 LAB — URINALYSIS, ROUTINE W REFLEX MICROSCOPIC
Bilirubin Urine: NEGATIVE
Glucose, UA: NEGATIVE mg/dL
Hgb urine dipstick: NEGATIVE
Ketones, ur: NEGATIVE mg/dL
Leukocytes, UA: NEGATIVE
Nitrite: NEGATIVE
Protein, ur: NEGATIVE mg/dL
Specific Gravity, Urine: 1.022 (ref 1.005–1.030)
Urobilinogen, UA: 0.2 mg/dL (ref 0.0–1.0)
pH: 7.5 (ref 5.0–8.0)

## 2013-11-20 LAB — COMPREHENSIVE METABOLIC PANEL
ALT: 21 U/L (ref 0–35)
AST: 22 U/L (ref 0–37)
Albumin: 3.8 g/dL (ref 3.5–5.2)
Alkaline Phosphatase: 73 U/L (ref 39–117)
BUN: 12 mg/dL (ref 6–23)
CO2: 26 mEq/L (ref 19–32)
Calcium: 10.3 mg/dL (ref 8.4–10.5)
Chloride: 101 mEq/L (ref 96–112)
Creatinine, Ser: 0.7 mg/dL (ref 0.50–1.10)
GFR calc Af Amer: >90 mL/min (ref >90)
GFR calc non Af Amer: 87 mL/min — ABNORMAL LOW (ref >90)
Glucose, Bld: 102 mg/dL — ABNORMAL HIGH (ref 70–99)
Potassium: 3.7 mEq/L (ref 3.7–5.3)
Sodium: 139 mEq/L (ref 137–147)
Total Bilirubin: 0.3 mg/dL (ref 0.3–1.2)
Total Protein: 8.1 g/dL (ref 6.0–8.3)

## 2013-11-20 LAB — LIPASE, BLOOD: Lipase: 28 U/L (ref 11–59)

## 2013-11-20 LAB — POC OCCULT BLOOD, ED: Fecal Occult Bld: POSITIVE — AB

## 2013-11-20 NOTE — ED Provider Notes (Signed)
CSN: [id number]     Arrival date & time 11/20/13  1202 History   First MD Initiated Contact with Patient 11/20/13 1221     Chief Complaint  Patient presents with  . Abdominal Pain     (Consider location/radiation/quality/duration/timing/severity/associated sxs/prior Treatment) HPI  Tracy Fisher is a 68 y.o. female complaining of pain to left hip and left lower quadrant intermittent, lasting a last several months. Patient states she also has trouble sleeping and weight loss. Patient doesn't know how much weight she is lost but states that she scanning or than she was 140 2 months ago. Last colonoscopy was 3 years ago, normal as per patient. Patient denies fever, chills, easy bruising bleeding, chest pain, shortness of breath, decreased by mouth intake, melena, hematochezia, change in urination. Patient has appointment with primary care doctor on June 5, cannot explain what prompted her to present to the ED today. There does not appear to be a worsening or change in symptoms.   Past Medical History  Diagnosis Date  . Hypertension    History reviewed. No pertinent past surgical history. No family history on file. History  Substance Use Topics  . Smoking status: Never Smoker   . Smokeless tobacco: Not on file  . Alcohol Use: No   OB History   Grav Para Term Preterm Abortions TAB SAB Ect Mult Living                 Review of Systems  10 systems reviewed and found to be negative, except as noted in the HPI.   Allergies  Sulfa antibiotics  Home Medications   Prior to Admission medications   Medication Sig Start Date End Date Taking? Authorizing Provider  amLODipine (NORVASC) 10 MG tablet Take 5 mg by mouth daily.   Yes Historical Provider, MD  aspirin EC 81 MG tablet Take 81 mg by mouth daily.   Yes Historical Provider, MD  Calcium Carbonate-Vitamin D (CALCIUM + D PO) Take 1 tablet by mouth daily.   Yes Historical Provider, MD  Cholecalciferol 1000 UNITS TBDP Take 4,000  Units by mouth daily.   Yes Historical Provider, MD  latanoprost (XALATAN) 0.005 % ophthalmic solution Place 1 drop into both eyes at bedtime.   Yes Historical Provider, MD   BP 131/66  Pulse 69  Temp(Src) 98.4 F (36.9 C) (Oral)  Resp 18  Wt 134 lb 8 oz (61.009 kg)  SpO2 95% Physical Exam  Nursing note and vitals reviewed. Constitutional: She is oriented to person, place, and time. She appears well-developed and well-nourished. No distress.  HENT:  Head: Normocephalic.  Mouth/Throat: Oropharynx is clear and moist.  Eyes: Conjunctivae and EOM are normal. Pupils are equal, round, and reactive to light.  Neck: Normal range of motion. Neck supple.  Cardiovascular: Normal rate, regular rhythm and intact distal pulses.   Pulmonary/Chest: Effort normal and breath sounds normal. No stridor. No respiratory distress. She has no wheezes. She has no rales. She exhibits no tenderness.  Abdominal: Soft. Bowel sounds are normal. She exhibits no distension and no mass. There is no tenderness. There is no rebound and no guarding.  Musculoskeletal: Normal range of motion.  Neurological: She is alert and oriented to person, place, and time.  Psychiatric: She has a normal mood and affect.    ED Course  Procedures (including critical care time) Labs Review Labs Reviewed  COMPREHENSIVE METABOLIC PANEL - Abnormal; Notable for the following:    Glucose, Bld 102 (*)    GFR  calc non Af Amer 87 (*)    All other components within normal limits  CBC WITH DIFFERENTIAL - Abnormal; Notable for the following:    WBC 3.2 (*)    RBC 3.71 (*)    HCT 35.8 (*)    All other components within normal limits  URINALYSIS, ROUTINE W REFLEX MICROSCOPIC - Abnormal; Notable for the following:    APPearance CLOUDY (*)    All other components within normal limits  POC OCCULT BLOOD, ED - Abnormal; Notable for the following:    Fecal Occult Bld POSITIVE (*)    All other components within normal limits  LIPASE, BLOOD     Imaging Review Dg Abd Acute W/chest  11/20/2013   CLINICAL DATA:  Left lower quadrant pain, back pain  EXAM: ACUTE ABDOMEN SERIES (ABDOMEN 2 VIEW & CHEST 1 VIEW)  COMPARISON:  07/15/2005  FINDINGS: Cardiomediastinal silhouette is unremarkable. No acute infiltrate or pleural effusion. No pulmonary edema. There is nonspecific nonobstructive bowel gas pattern. Moderate stool noted in right colon. No free abdominal air.  IMPRESSION: Negative abdominal radiographs.  No acute cardiopulmonary disease.   Electronically Signed   By: Lahoma Crocker M.D.   On: 11/20/2013 14:42     EKG Interpretation None      MDM   Final diagnoses:  Abdominal pain  Occult blood in stools  Leukopenia  Unintended weight loss    Filed Vitals:   11/20/13 1321 11/20/13 1500 11/20/13 1518 11/20/13 1521  BP:  133/67 131/66   Pulse:  69 69   Temp:    98.4 F (36.9 C)  TempSrc:    Oral  Resp:      Weight: 134 lb 8 oz (61.009 kg)     SpO2:  97% 95%     Tracy Fisher is a 68 y.o. female presenting with a month of left sided abdominal pain. Patient also reports weight loss, after weighing the patient it appears that she has lost 6 pounds in 2 months unintentionally. Abdominal exam is benign. Blood work shows mildly reduced white blood cell count 3.2. acute abdominal series within normal limits. Guaiac is positive.   Combination of unintentional weight loss, occult blood in stool, leukopenia is concerning for occult malignancy. I have discussed this with patient and her family members. I recommend close followup with primary care and GI. Extensive discussion of return precautions.  Evaluation does not show pathology that would require ongoing emergent intervention or inpatient treatment. Pt is hemodynamically stable and mentating appropriately. Discussed findings and plan with patient/guardian, who agrees with care plan. All questions answered. Return precautions discussed and outpatient follow up given.     Note: Portions of this report may have been transcribed using voice recognition software. Every effort was made to ensure accuracy; however, inadvertent computerized transcription errors may be present     Monico Blitz, PA-C 11/20/13 1624

## 2013-11-20 NOTE — ED Notes (Signed)
Pt presents to department for evaluation of lower abdominal pain and coccyx pain. Ongoing for several months. No nausea/vomiting. 2/10 pain at the time. Pt is alert and oriented x4.

## 2013-11-20 NOTE — Discharge Instructions (Signed)
Please follow with your primary care doctor in the next 2 days for a check-up. They must obtain records for further management.   Do not hesitate to return to the Emergency Department for any new, worsening or concerning symptoms.    Abdominal Pain, Adult Many things can cause belly (abdominal) pain. Most times, the belly pain is not dangerous. Many cases of belly pain can be watched and treated at home. HOME CARE   Do not take medicines that help you go poop (laxatives) unless told to by your doctor.  Only take medicine as told by your doctor.  Eat or drink as told by your doctor. Your doctor will tell you if you should be on a special diet. GET HELP IF:  You do not know what is causing your belly pain.  You have belly pain while you are sick to your stomach (nauseous) or have runny poop (diarrhea).  You have pain while you pee or poop.  Your belly pain wakes you up at night.  You have belly pain that gets worse or better when you eat.  You have belly pain that gets worse when you eat fatty foods. GET HELP RIGHT AWAY IF:   The pain does not go away within 2 hours.  You have a fever.  You keep throwing up (vomiting).  The pain changes and is only in the right or left part of the belly.  You have bloody or tarry looking poop. MAKE SURE YOU:   Understand these instructions.  Will watch your condition.  Will get help right away if you are not doing well or get worse. Document Released: 12/02/2007 Document Revised: 04/05/2013 Document Reviewed: 02/22/2013 [facility] Patient Information 2014 [place].  Bloody Stools Bloody stools often mean that there is a problem in the digestive tract. Your caregiver may use the term "melena" to describe black, tarry, and bad smelling stools or "hematochezia" to describe red or maroon-colored stools. Blood seen in the stool can be caused by bleeding anywhere along the intestinal tract.  A black stool usually means that blood is  coming from the upper part of the gastrointestinal tract (esophagus, stomach, or small bowel). Passing maroon-colored stools or bright red blood usually means that blood is coming from lower down in the large bowel or the rectum. However, sometimes massive bleeding in the stomach or small intestine can cause bright red bloody stools.  Consuming black licorice, lead, iron pills, medicines containing bismuth subsalicylate, or blueberries can also cause black stools. Your caregiver can test black stools to see if blood is present. It is important that the cause of the bleeding be found. Treatment can then be started, and the problem can be corrected. Rectal bleeding may not be serious, but you should not assume everything is okay until you know the cause.It is very important to follow up with your caregiver or a specialist in gastrointestinal problems. CAUSES  Blood in the stools can come from various underlying causes.Often, the cause is not found during your first visit. Testing is often needed to discover the cause of bleeding in the gastrointestinal tract. Causes range from simple to serious or even life-threatening.Possible causes include:  Hemorrhoids.These are veins that are full of blood (engorged) in the rectum. They cause pain, inflammation, and may bleed.  Anal fissures.These are areas of painful tearing which may bleed. They are often caused by passing hard stool.  Diverticulosis.These are pouches that form on the colon over time, with age, and may bleed significantly.  Diverticulitis.This is inflammation in areas with diverticulosis. It can cause pain, fever, and bloody stools, although bleeding is rare.  Proctitis and colitis. These are inflamed areas of the rectum or colon. They may cause pain, fever, and bloody stools.  Polyps and cancer. Colon cancer is a leading cause of preventable cancer death.It often starts out as precancerous polyps that can be removed during a  colonoscopy, preventing progression into cancer. Sometimes, polyps and cancer may cause rectal bleeding.  Gastritis and ulcers.Bleeding from the upper gastrointestinal tract (near the stomach) may travel through the intestines and produce black, sometimes tarry, often bad smelling stools. In certain cases, if the bleeding is fast enough, the stools may not be black, but red and the condition may be life-threatening. SYMPTOMS  You may have stools that are bright red and bloody, that are normal color with blood on them, or that are dark black and tarry. In some cases, you may only have blood in the toilet bowl. Any of these cases need medical care. You may also have:  Pain at the anus or anywhere in the rectum.  Lightheadedness or feeling faint.  Extreme weakness.  Nausea or vomiting.  Fever. DIAGNOSIS Your caregiver may use the following methods to find the cause of your bleeding:  Taking a medical history. Age is important. Older people tend to develop polyps and cancer more often. If there is anal pain and a hard, large stool associated with bleeding, a tear of the anus may be the cause. If blood drips into the toilet after a bowel movement, bleeding hemorrhoids may be the problem. The color and frequency of the bleeding are additional considerations. In most cases, the medical history provides clues, but seldom the final answer.  A visual and finger (digital) exam. Your caregiver will inspect the anal area, looking for tears and hemorrhoids. A finger exam can provide information when there is tenderness or a growth inside. In men, the prostate is also examined.  Endoscopy. Several types of small, long scopes (endoscopes) are used to view the colon.  In the office, your caregiver may use a rigid, or more commonly, a flexible viewing sigmoidoscope. This exam is called flexible sigmoidoscopy. It is performed in 5 to 10 minutes.  A more thorough exam is accomplished with a colonoscope. It  allows your caregiver to view the entire 5 to 6 foot long colon. Medicine to help you relax (sedative) is usually given for this exam. Frequently, a bleeding lesion may be present beyond the reach of the sigmoidoscope. So, a colonoscopy may be the best exam to start with. Both exams are usually done on an outpatient basis. This means the patient does not stay overnight in the hospital or surgery center.  An upper endoscopy may be needed to examine your stomach. Sedation is used and a flexible endoscope is put in your mouth, down to your stomach.  A barium enema X-ray. This is an X-ray exam. It uses liquid barium inserted by enema into the rectum. This test alone may not identify an actual bleeding point. X-rays highlight abnormal shadows, such as those made by lumps (tumors), diverticuli, or colitis. TREATMENT  Treatment depends on the cause of your bleeding.   For bleeding from the stomach or colon, the caregiver doing your endoscopy or colonoscopy may be able to stop the bleeding as part of the procedure.  Inflammation or infection of the colon can be treated with medicines.  Many rectal problems can be treated with creams, suppositories, or warm  baths.  Surgery is sometimes needed.  Blood transfusions are sometimes needed if you have lost a lot of blood.  For any bleeding problem, let your caregiver know if you take aspirin or other blood thinners regularly. HOME CARE INSTRUCTIONS   Take any medicines exactly as prescribed.  Keep your stools soft by eating a diet high in fiber. Prunes (1 to 3 a day) work well for many people.  Drink enough water and fluids to keep your urine clear or pale yellow.  Take sitz baths if advised. A sitz bath is when you sit in a bathtub with warm water for 10 to 15 minutes to soak, soothe, and cleanse the rectal area.  If enemas or suppositories are advised, be sure you know how to use them. Tell your caregiver if you have problems with this.  Monitor  your bowel movements to look for signs of improvement or worsening. SEEK MEDICAL CARE IF:   You do not improve in the time expected.  Your condition worsens after initial improvement.  You develop any new symptoms. SEEK IMMEDIATE MEDICAL CARE IF:   You develop severe or prolonged rectal bleeding.  You vomit blood.  You feel weak or faint.  You have a fever. MAKE SURE YOU:  Understand these instructions.  Will watch your condition.  Will get help right away if you are not doing well or get worse. Document Released: 06/05/2002 Document Revised: 09/07/2011 Document Reviewed: 10/31/2010 [facility] Patient Information 2014 [place], [place].

## 2013-11-23 ENCOUNTER — Other Ambulatory Visit: Payer: Self-pay | Admitting: Family Medicine

## 2013-11-23 DIAGNOSIS — R634 Abnormal weight loss: Secondary | ICD-10-CM

## 2013-11-23 DIAGNOSIS — R109 Unspecified abdominal pain: Secondary | ICD-10-CM

## 2013-11-23 NOTE — ED Provider Notes (Signed)
Medical screening examination/treatment/procedure(s) were performed by non-physician practitioner and as supervising physician I was immediately available for consultation/collaboration.    Dot Lanes, MD 11/23/13 [phone]

## 2013-11-27 ENCOUNTER — Ambulatory Visit
Admission: RE | Admit: 2013-11-27 | Discharge: 2013-11-27 | Disposition: A | Discharge status: Home / Self Care | Payer: Medicare Other | Source: Ambulatory Visit | Attending: Family Medicine | Admitting: Family Medicine

## 2013-11-27 DIAGNOSIS — R634 Abnormal weight loss: Secondary | ICD-10-CM

## 2013-11-27 DIAGNOSIS — R109 Unspecified abdominal pain: Secondary | ICD-10-CM

## 2013-11-27 MED ORDER — IOHEXOL 300 MG/ML  SOLN
100.0000 mL | Freq: Once | INTRAMUSCULAR | Status: AC | PRN
  Administered 2013-11-27: 100 mL via INTRAVENOUS

## 2014-09-17 ENCOUNTER — Other Ambulatory Visit (HOSPITAL_COMMUNITY): Payer: Self-pay | Admitting: Family Medicine

## 2014-09-17 DIAGNOSIS — Z1231 Encounter for screening mammogram for malignant neoplasm of breast: Secondary | ICD-10-CM

## 2014-11-09 ENCOUNTER — Ambulatory Visit (HOSPITAL_COMMUNITY)
Admission: RE | Admit: 2014-11-09 | Discharge: 2014-11-09 | Disposition: A | Discharge status: Home / Self Care | Payer: Medicare HMO | Source: Ambulatory Visit | Attending: Family Medicine | Admitting: Family Medicine

## 2014-11-09 DIAGNOSIS — Z1231 Encounter for screening mammogram for malignant neoplasm of breast: Secondary | ICD-10-CM | POA: Diagnosis not present

## 2015-02-11 DIAGNOSIS — R490 Dysphonia: Secondary | ICD-10-CM | POA: Insufficient documentation

## 2015-04-22 DIAGNOSIS — Z658 Other specified problems related to psychosocial circumstances: Secondary | ICD-10-CM | POA: Diagnosis not present

## 2015-04-22 DIAGNOSIS — R7309 Other abnormal glucose: Secondary | ICD-10-CM | POA: Diagnosis not present

## 2015-04-22 DIAGNOSIS — Z23 Encounter for immunization: Secondary | ICD-10-CM | POA: Diagnosis not present

## 2015-04-22 DIAGNOSIS — R634 Abnormal weight loss: Secondary | ICD-10-CM | POA: Diagnosis not present

## 2015-04-22 DIAGNOSIS — R739 Hyperglycemia, unspecified: Secondary | ICD-10-CM | POA: Diagnosis not present

## 2015-04-22 DIAGNOSIS — I10 Essential (primary) hypertension: Secondary | ICD-10-CM | POA: Diagnosis not present

## 2015-05-15 DIAGNOSIS — H25813 Combined forms of age-related cataract, bilateral: Secondary | ICD-10-CM | POA: Diagnosis not present

## 2015-05-15 DIAGNOSIS — H401131 Primary open-angle glaucoma, bilateral, mild stage: Secondary | ICD-10-CM | POA: Diagnosis not present

## 2015-06-03 DIAGNOSIS — H25811 Combined forms of age-related cataract, right eye: Secondary | ICD-10-CM | POA: Diagnosis not present

## 2015-06-03 DIAGNOSIS — H2511 Age-related nuclear cataract, right eye: Secondary | ICD-10-CM | POA: Diagnosis not present

## 2015-06-13 DIAGNOSIS — H35371 Puckering of macula, right eye: Secondary | ICD-10-CM | POA: Diagnosis not present

## 2015-06-13 DIAGNOSIS — H4389 Other disorders of vitreous body: Secondary | ICD-10-CM | POA: Diagnosis not present

## 2015-07-04 DIAGNOSIS — H2512 Age-related nuclear cataract, left eye: Secondary | ICD-10-CM | POA: Diagnosis not present

## 2015-07-08 DIAGNOSIS — H269 Unspecified cataract: Secondary | ICD-10-CM | POA: Diagnosis not present

## 2015-07-08 DIAGNOSIS — H2512 Age-related nuclear cataract, left eye: Secondary | ICD-10-CM | POA: Diagnosis not present

## 2015-07-15 DIAGNOSIS — I10 Essential (primary) hypertension: Secondary | ICD-10-CM | POA: Diagnosis not present

## 2015-07-15 DIAGNOSIS — B360 Pityriasis versicolor: Secondary | ICD-10-CM | POA: Diagnosis not present

## 2015-07-15 DIAGNOSIS — R69 Illness, unspecified: Secondary | ICD-10-CM | POA: Diagnosis not present

## 2015-07-24 ENCOUNTER — Ambulatory Visit
Admission: RE | Admit: 2015-07-24 | Discharge: 2015-07-24 | Disposition: A | Discharge status: Home / Self Care | Payer: Medicare HMO | Source: Ambulatory Visit | Attending: Family Medicine | Admitting: Family Medicine

## 2015-07-24 ENCOUNTER — Other Ambulatory Visit: Payer: Self-pay | Admitting: Family Medicine

## 2015-07-24 DIAGNOSIS — M545 Low back pain: Secondary | ICD-10-CM | POA: Diagnosis not present

## 2015-07-24 DIAGNOSIS — M25551 Pain in right hip: Secondary | ICD-10-CM

## 2015-07-24 DIAGNOSIS — M65331 Trigger finger, right middle finger: Secondary | ICD-10-CM | POA: Diagnosis not present

## 2015-09-04 ENCOUNTER — Other Ambulatory Visit: Payer: Self-pay

## 2015-09-04 DIAGNOSIS — Z1231 Encounter for screening mammogram for malignant neoplasm of breast: Secondary | ICD-10-CM

## 2015-11-15 ENCOUNTER — Ambulatory Visit
Admission: RE | Admit: 2015-11-15 | Discharge: 2015-11-15 | Disposition: A | Discharge status: Home / Self Care | Payer: Medicare HMO | Source: Ambulatory Visit

## 2015-11-15 DIAGNOSIS — Z1231 Encounter for screening mammogram for malignant neoplasm of breast: Secondary | ICD-10-CM | POA: Diagnosis not present

## 2015-12-04 DIAGNOSIS — E559 Vitamin D deficiency, unspecified: Secondary | ICD-10-CM | POA: Diagnosis not present

## 2015-12-04 DIAGNOSIS — M542 Cervicalgia: Secondary | ICD-10-CM | POA: Diagnosis not present

## 2015-12-04 DIAGNOSIS — R197 Diarrhea, unspecified: Secondary | ICD-10-CM | POA: Diagnosis not present

## 2015-12-04 DIAGNOSIS — R7309 Other abnormal glucose: Secondary | ICD-10-CM | POA: Diagnosis not present

## 2015-12-04 DIAGNOSIS — R05 Cough: Secondary | ICD-10-CM | POA: Diagnosis not present

## 2015-12-04 DIAGNOSIS — E785 Hyperlipidemia, unspecified: Secondary | ICD-10-CM | POA: Diagnosis not present

## 2015-12-04 DIAGNOSIS — B360 Pityriasis versicolor: Secondary | ICD-10-CM | POA: Diagnosis not present

## 2015-12-05 DIAGNOSIS — R109 Unspecified abdominal pain: Secondary | ICD-10-CM | POA: Diagnosis not present

## 2016-01-13 DIAGNOSIS — N951 Menopausal and female climacteric states: Secondary | ICD-10-CM | POA: Diagnosis not present

## 2016-01-13 DIAGNOSIS — Z0000 Encounter for general adult medical examination without abnormal findings: Secondary | ICD-10-CM | POA: Diagnosis not present

## 2016-01-13 DIAGNOSIS — R69 Illness, unspecified: Secondary | ICD-10-CM | POA: Diagnosis not present

## 2016-01-13 DIAGNOSIS — R7303 Prediabetes: Secondary | ICD-10-CM | POA: Diagnosis not present

## 2016-01-13 DIAGNOSIS — I10 Essential (primary) hypertension: Secondary | ICD-10-CM | POA: Diagnosis not present

## 2016-01-13 DIAGNOSIS — R350 Frequency of micturition: Secondary | ICD-10-CM | POA: Diagnosis not present

## 2016-01-15 DIAGNOSIS — H401131 Primary open-angle glaucoma, bilateral, mild stage: Secondary | ICD-10-CM | POA: Diagnosis not present

## 2016-01-15 DIAGNOSIS — Z961 Presence of intraocular lens: Secondary | ICD-10-CM | POA: Diagnosis not present

## 2016-01-15 DIAGNOSIS — H209 Unspecified iridocyclitis: Secondary | ICD-10-CM | POA: Diagnosis not present

## 2016-02-06 ENCOUNTER — Emergency Department (HOSPITAL_COMMUNITY)
Admission: EM | Admit: 2016-02-06 | Discharge: 2016-02-06 | Disposition: A | Discharge status: Home / Self Care | Payer: Medicare HMO | Attending: Emergency Medicine | Admitting: Emergency Medicine

## 2016-02-06 ENCOUNTER — Encounter (HOSPITAL_COMMUNITY): Payer: Self-pay

## 2016-02-06 DIAGNOSIS — T6591XA Toxic effect of unspecified substance, accidental (unintentional), initial encounter: Secondary | ICD-10-CM

## 2016-02-06 DIAGNOSIS — Y999 Unspecified external cause status: Secondary | ICD-10-CM | POA: Insufficient documentation

## 2016-02-06 DIAGNOSIS — Z7982 Long term (current) use of aspirin: Secondary | ICD-10-CM | POA: Diagnosis not present

## 2016-02-06 DIAGNOSIS — Y929 Unspecified place or not applicable: Secondary | ICD-10-CM | POA: Diagnosis not present

## 2016-02-06 DIAGNOSIS — T5491XA Toxic effect of unspecified corrosive substance, accidental (unintentional), initial encounter: Secondary | ICD-10-CM | POA: Diagnosis not present

## 2016-02-06 DIAGNOSIS — Y939 Activity, unspecified: Secondary | ICD-10-CM | POA: Insufficient documentation

## 2016-02-06 DIAGNOSIS — X58XXXA Exposure to other specified factors, initial encounter: Secondary | ICD-10-CM | POA: Diagnosis not present

## 2016-02-06 DIAGNOSIS — I10 Essential (primary) hypertension: Secondary | ICD-10-CM | POA: Diagnosis not present

## 2016-02-06 NOTE — ED Notes (Signed)
Pt believes the cleaner was from dollar store that's called "bathroom cleaner" and it was mixed with some clorox and water.

## 2016-02-06 NOTE — ED Notes (Signed)
Poison control called to check on pt. Pt tolerating PO's well.

## 2016-02-06 NOTE — ED Notes (Addendum)
This RN called poison control concerning the cleaner that pt ingested. Suggested plan from poison control: PO challenge, if pt tolerates well, she may be discharged.

## 2016-02-06 NOTE — ED Notes (Signed)
Gave pt water and [place] sandwich for PO challenge

## 2016-02-06 NOTE — ED Provider Notes (Signed)
[place] DEPT Provider Note   CSN: [id number] Arrival date & time: 02/06/16  1236  First Provider Contact:  First MD Initiated Contact with Patient 02/06/16 1305        History   Chief Complaint Chief Complaint  Patient presents with  . Poisoning    HPI Tracy Fisher is a 70 y.o. female.  HPI Tracy Fisher is a 70 y.o. female with PMH significant for HTN who presents with poisoning.  Patient accidentally drank about 4 ounces of water mixed with clorox and water about 11 AM today.  She attempted to vomit and spit it up, but failed.  She drank water and tea to try and flush it out.  She denies any choking, N/V/D, abdominal pain, or any other symptoms at this time.  Past Medical History:  Diagnosis Date  . Hypertension     There are no active problems to display for this patient.   History reviewed. No pertinent surgical history.  OB History    No data available       Home Medications    Prior to Admission medications   Medication Sig Start Date End Date Taking? Authorizing Provider  amLODipine (NORVASC) 10 MG tablet Take 5 mg by mouth daily.    Historical Provider, MD  aspirin EC 81 MG tablet Take 81 mg by mouth daily.    Historical Provider, MD  Calcium Carbonate-Vitamin D (CALCIUM + D PO) Take 1 tablet by mouth daily.    Historical Provider, MD  Cholecalciferol 1000 UNITS TBDP Take 4,000 Units by mouth daily.    Historical Provider, MD  latanoprost (XALATAN) 0.005 % ophthalmic solution Place 1 drop into both eyes at bedtime.    Historical Provider, MD    Family History No family history on file.  Social History Social History  Substance Use Topics  . Smoking status: Never Smoker  . Smokeless tobacco: Never Used  . Alcohol use No     Allergies   Sulfa antibiotics   Review of Systems Review of Systems All other systems negative unless otherwise stated in HPI   Physical Exam Updated Vital Signs BP 134/83   Pulse 75   Temp 98.6 F  (37 C) (Oral)   Resp 18   SpO2 97%   Physical Exam  Constitutional: She is oriented to person, place, and time. She appears well-developed and well-nourished.  Non-toxic appearance. She does not have a sickly appearance. She does not appear ill.  HENT:  Head: Normocephalic and atraumatic.  Mouth/Throat: Oropharynx is clear and moist.  Eyes: Conjunctivae are normal. Pupils are equal, round, and reactive to light.  Neck: Normal range of motion. Neck supple.  Cardiovascular: Normal rate and regular rhythm.   Pulmonary/Chest: Effort normal and breath sounds normal. No accessory muscle usage or stridor. No respiratory distress. She has no wheezes. She has no rhonchi. She has no rales.  Abdominal: Soft. Bowel sounds are normal. She exhibits no distension. There is no tenderness.  Musculoskeletal: Normal range of motion.  Lymphadenopathy:    She has no cervical adenopathy.  Neurological: She is alert and oriented to person, place, and time.  Speech clear without dysarthria.  Skin: Skin is warm and dry.  Psychiatric: She has a normal mood and affect. Her behavior is normal.     ED Treatments / Results  Labs (all labs ordered are listed, but only abnormal results are displayed) Labs Reviewed - No data to display  EKG  EKG Interpretation None  Radiology No results found.  Procedures Procedures (including critical care time)  Medications Ordered in ED Medications - No data to display   Initial Impression / Assessment and Plan / ED Course  I have reviewed the triage vital signs and the nursing notes.  Pertinent labs & imaging results that were available during my care of the patient were reviewed by me and considered in my medical decision making (see chart for details).  Clinical Course   Patient presents with clorox and water mixture ingestion around 11 AM.  Asymptomatic.  Appears well, non-toxic.  VSS, NAD.  Remained asymptomatic throughout ED stay.  Spoke with  poison control, patient able to tolerate PO without difficulty. Evaluation does not show pathology requiring ongoing emergent intervention or admission. Pt is hemodynamically stable and mentating appropriately. Discussed findings/results and plan with patient/guardian, who agrees with plan. All questions answered. Return precautions discussed and outpatient follow up given.    Final Clinical Impressions(s) / ED Diagnoses   Final diagnoses:  Accidental ingestion of substance, initial encounter    New Prescriptions New Prescriptions   No medications on file     Gloriann Loan, PA-C 02/06/16 1449    Leo Grosser, MD 02/06/16 [phone]

## 2016-02-06 NOTE — ED Triage Notes (Signed)
Patient here with ingestion of bathroom cleaner after accidentally putting it in fridge than drinking small amount of same. Drank some water and tea trying to flush same. NAD

## 2016-02-10 ENCOUNTER — Encounter: Payer: Self-pay | Admitting: Hematology

## 2016-02-10 DIAGNOSIS — M8588 Other specified disorders of bone density and structure, other site: Secondary | ICD-10-CM | POA: Diagnosis not present

## 2016-02-10 DIAGNOSIS — Z780 Asymptomatic menopausal state: Secondary | ICD-10-CM | POA: Diagnosis not present

## 2016-02-18 DIAGNOSIS — Z23 Encounter for immunization: Secondary | ICD-10-CM | POA: Diagnosis not present

## 2016-02-18 DIAGNOSIS — T6591XD Toxic effect of unspecified substance, accidental (unintentional), subsequent encounter: Secondary | ICD-10-CM | POA: Diagnosis not present

## 2016-03-31 DIAGNOSIS — I10 Essential (primary) hypertension: Secondary | ICD-10-CM | POA: Diagnosis not present

## 2016-03-31 DIAGNOSIS — R69 Illness, unspecified: Secondary | ICD-10-CM | POA: Diagnosis not present

## 2016-03-31 DIAGNOSIS — Z0000 Encounter for general adult medical examination without abnormal findings: Secondary | ICD-10-CM | POA: Diagnosis not present

## 2016-05-27 DIAGNOSIS — Z961 Presence of intraocular lens: Secondary | ICD-10-CM | POA: Diagnosis not present

## 2016-05-27 DIAGNOSIS — H401131 Primary open-angle glaucoma, bilateral, mild stage: Secondary | ICD-10-CM | POA: Diagnosis not present

## 2016-05-28 DIAGNOSIS — J329 Chronic sinusitis, unspecified: Secondary | ICD-10-CM | POA: Diagnosis not present

## 2016-06-09 DIAGNOSIS — M1712 Unilateral primary osteoarthritis, left knee: Secondary | ICD-10-CM | POA: Diagnosis not present

## 2016-06-09 DIAGNOSIS — M1711 Unilateral primary osteoarthritis, right knee: Secondary | ICD-10-CM | POA: Diagnosis not present

## 2016-06-09 DIAGNOSIS — M25562 Pain in left knee: Secondary | ICD-10-CM | POA: Diagnosis not present

## 2016-06-09 DIAGNOSIS — M25561 Pain in right knee: Secondary | ICD-10-CM | POA: Diagnosis not present

## 2016-06-29 DIAGNOSIS — C419 Malignant neoplasm of bone and articular cartilage, unspecified: Secondary | ICD-10-CM

## 2016-06-29 HISTORY — DX: Malignant neoplasm of bone and articular cartilage, unspecified: C41.9

## 2016-07-14 DIAGNOSIS — E7439 Other disorders of intestinal carbohydrate absorption: Secondary | ICD-10-CM | POA: Diagnosis not present

## 2016-07-14 DIAGNOSIS — R7309 Other abnormal glucose: Secondary | ICD-10-CM | POA: Diagnosis not present

## 2016-07-14 DIAGNOSIS — I10 Essential (primary) hypertension: Secondary | ICD-10-CM | POA: Diagnosis not present

## 2016-07-14 DIAGNOSIS — J209 Acute bronchitis, unspecified: Secondary | ICD-10-CM | POA: Diagnosis not present

## 2016-07-14 DIAGNOSIS — R69 Illness, unspecified: Secondary | ICD-10-CM | POA: Diagnosis not present

## 2016-07-21 DIAGNOSIS — H43813 Vitreous degeneration, bilateral: Secondary | ICD-10-CM | POA: Diagnosis not present

## 2016-07-21 DIAGNOSIS — H401132 Primary open-angle glaucoma, bilateral, moderate stage: Secondary | ICD-10-CM | POA: Diagnosis not present

## 2016-07-25 ENCOUNTER — Emergency Department (HOSPITAL_COMMUNITY)
Admission: EM | Admit: 2016-07-25 | Discharge: 2016-07-25 | Disposition: A | Discharge status: Home / Self Care | Payer: Medicare HMO | Attending: Emergency Medicine | Admitting: Emergency Medicine

## 2016-07-25 ENCOUNTER — Encounter (HOSPITAL_COMMUNITY): Payer: Self-pay

## 2016-07-25 ENCOUNTER — Emergency Department (HOSPITAL_COMMUNITY): Payer: Medicare HMO

## 2016-07-25 DIAGNOSIS — J181 Lobar pneumonia, unspecified organism: Secondary | ICD-10-CM | POA: Insufficient documentation

## 2016-07-25 DIAGNOSIS — I10 Essential (primary) hypertension: Secondary | ICD-10-CM | POA: Insufficient documentation

## 2016-07-25 DIAGNOSIS — J189 Pneumonia, unspecified organism: Secondary | ICD-10-CM

## 2016-07-25 DIAGNOSIS — R05 Cough: Secondary | ICD-10-CM | POA: Diagnosis not present

## 2016-07-25 DIAGNOSIS — Z7982 Long term (current) use of aspirin: Secondary | ICD-10-CM | POA: Diagnosis not present

## 2016-07-25 LAB — COMPREHENSIVE METABOLIC PANEL
ALT: 29 U/L (ref 14–54)
AST: 43 U/L — ABNORMAL HIGH (ref 15–41)
Albumin: 3.9 g/dL (ref 3.5–5.0)
Alkaline Phosphatase: 65 U/L (ref 38–126)
Anion gap: 10 (ref 5–15)
BUN: 11 mg/dL (ref 6–20)
CO2: 23 mmol/L (ref 22–32)
Calcium: 9.3 mg/dL (ref 8.9–10.3)
Chloride: 105 mmol/L (ref 101–111)
Creatinine, Ser: 0.97 mg/dL (ref 0.44–1.00)
GFR calc Af Amer: >60 mL/min (ref >60)
GFR calc non Af Amer: 58 mL/min — ABNORMAL LOW (ref >60)
Glucose, Bld: 91 mg/dL (ref 65–99)
Potassium: 3.4 mmol/L — ABNORMAL LOW (ref 3.5–5.1)
Sodium: 138 mmol/L (ref 135–145)
Total Bilirubin: 0.4 mg/dL (ref 0.3–1.2)
Total Protein: 8.4 g/dL — ABNORMAL HIGH (ref 6.5–8.1)

## 2016-07-25 LAB — CBC WITH DIFFERENTIAL/PLATELET
Band Neutrophils: 1 %
Basophils Absolute: 0 10*3/uL (ref 0.0–0.1)
Basophils Relative: 0 %
Eosinophils Absolute: 0 10*3/uL (ref 0.0–0.7)
Eosinophils Relative: 0 %
HCT: 38.1 % (ref 36.0–46.0)
Hemoglobin: 13 g/dL (ref 12.0–15.0)
Lymphocytes Relative: 46 %
Lymphs Abs: 1 10*3/uL (ref 0.7–4.0)
MCH: 32.3 pg (ref 26.0–34.0)
MCHC: 34.1 g/dL (ref 30.0–36.0)
MCV: 94.5 fL (ref 78.0–100.0)
Monocytes Absolute: 0.1 10*3/uL (ref 0.1–1.0)
Monocytes Relative: 7 %
Neutro Abs: 1 10*3/uL — ABNORMAL LOW (ref 1.7–7.7)
Neutrophils Relative %: 46 %
Platelets: 224 10*3/uL (ref 150–400)
RBC: 4.03 MIL/uL (ref 3.87–5.11)
RDW: 14.8 % (ref 11.5–15.5)
WBC: 2.1 10*3/uL — ABNORMAL LOW (ref 4.0–10.5)

## 2016-07-25 MED ORDER — DEXTROSE 5 % IV SOLN
1.0000 g | Freq: Once | INTRAVENOUS | Status: AC
  Administered 2016-07-25: 1 g via INTRAVENOUS
  Filled 2016-07-25: qty 10

## 2016-07-25 MED ORDER — DEXTROSE 5 % IV SOLN
500.0000 mg | Freq: Once | INTRAVENOUS | Status: AC
  Administered 2016-07-25: 500 mg via INTRAVENOUS
  Filled 2016-07-25: qty 500

## 2016-07-25 NOTE — Discharge Instructions (Signed)
Continue doxycycline as currently prescribed.  Continue Mucinex twice per day.  Follow-up with your primary care physician. Symptoms may require more than 2 weeks to resolve.

## 2016-07-25 NOTE — ED Notes (Signed)
Pt did not need anything at this time  

## 2016-07-25 NOTE — ED Provider Notes (Addendum)
[place] DEPT Provider Note   CSN: [id number] Arrival date & time: 07/25/16  1308     History   Chief Complaint Chief Complaint  Patient presents with  . cough, congestion    HPI Tracy Fisher is a 71 y.o. female. CC:  Cough and congestion  HPI:  Pt with a 10 day illness.  Started   Past Medical History:  Diagnosis Date  . Hypertension     There are no active problems to display for this patient.   History reviewed. No pertinent surgical history.  OB History    No data available       Home Medications    Prior to Admission medications   Medication Sig Start Date End Date Taking? Authorizing Provider  amLODipine (NORVASC) 10 MG tablet Take 5 mg by mouth daily.    Historical Provider, MD  aspirin EC 81 MG tablet Take 81 mg by mouth daily.    Historical Provider, MD  Calcium Carbonate-Vitamin D (CALCIUM + D PO) Take 1 tablet by mouth daily.    Historical Provider, MD  Cholecalciferol 1000 UNITS TBDP Take 4,000 Units by mouth daily.    Historical Provider, MD  latanoprost (XALATAN) 0.005 % ophthalmic solution Place 1 drop into both eyes at bedtime.    Historical Provider, MD    Family History No family history on file.  Social History Social History  Substance Use Topics  . Smoking status: Never Smoker  . Smokeless tobacco: Never Used  . Alcohol use No     Allergies   Sulfa antibiotics   Review of Systems Review of Systems  Constitutional: Negative for appetite change, chills, diaphoresis, fatigue and fever.  HENT: Positive for congestion and rhinorrhea. Negative for mouth sores, sinus pressure, sore throat and trouble swallowing.   Eyes: Negative for visual disturbance.  Respiratory: Positive for cough. Negative for chest tightness, shortness of breath and wheezing.   Cardiovascular: Negative for chest pain and leg swelling.  Gastrointestinal: Negative for abdominal distention, abdominal pain, diarrhea, nausea and vomiting.  Endocrine:  Negative for polydipsia, polyphagia and polyuria.  Genitourinary: Negative for dysuria, frequency and hematuria.  Musculoskeletal: Negative for gait problem.  Skin: Negative for color change, pallor and rash.  Neurological: Negative for dizziness, syncope, light-headedness and headaches.  Hematological: Does not bruise/bleed easily.  Psychiatric/Behavioral: Negative for behavioral problems and confusion.     Physical Exam Updated Vital Signs BP 121/78   Pulse 78   Temp 98.6 F (37 C) (Oral)   Resp 17   SpO2 96%   Physical Exam  Constitutional: She is oriented to person, place, and time. She appears well-developed and well-nourished. No distress.  HENT:  Head: Normocephalic.  Nasal congestion  Eyes: Conjunctivae are normal. Pupils are equal, round, and reactive to light. No scleral icterus.  Neck: Normal range of motion. Neck supple. No thyromegaly present.  Cardiovascular: Normal rate and regular rhythm.  Exam reveals no gallop and no friction rub.   No murmur heard. Pulmonary/Chest: Effort normal and breath sounds normal. No respiratory distress. She has no wheezes. She has no rales.  Clear bilateral breath sounds without increased worker breathing wheezing or prolongation. No focal diminished breath sounds. Even after seeing chest x-ray I cannot auscultate abnormal breath sounds.  Abdominal: Soft. Bowel sounds are normal. She exhibits no distension. There is no tenderness. There is no rebound.  Musculoskeletal: Normal range of motion.  Neurological: She is alert and oriented to person, place, and time.  Skin: Skin is warm  and dry. No rash noted.  Psychiatric: She has a normal mood and affect. Her behavior is normal.     ED Treatments / Results  Labs (all labs ordered are listed, but only abnormal results are displayed) Labs Reviewed  CULTURE, BLOOD (ROUTINE X 2)  CULTURE, BLOOD (ROUTINE X 2)  CBC WITH DIFFERENTIAL/PLATELET  COMPREHENSIVE METABOLIC PANEL  I-STAT CG4  LACTIC ACID, ED    EKG  EKG Interpretation None       Radiology Dg Chest 2 View  Result Date: 07/25/2016 CLINICAL DATA:  71 year old female with cough for 1 week. EXAM: CHEST  2 VIEW COMPARISON:  11/20/2013 radiographs FINDINGS: Mild left lower lobe airspace disease likely represents pneumonia. The cardiomediastinal silhouette is unremarkable. There is no evidence of pulmonary edema, suspicious pulmonary nodule/mass, pleural effusion, or pneumothorax. No acute bony abnormalities are identified. IMPRESSION: Probable left lower lobe pneumonia -radiographic follow-up to resolution recommended. Electronically Signed   By: Margarette Canada M.D.   On: 07/25/2016 14:24    Procedures Procedures (including critical care time)  Medications Ordered in ED Medications  cefTRIAXone (ROCEPHIN) 1 g in dextrose 5 % 50 mL IVPB (not administered)  azithromycin (ZITHROMAX) 500 mg in dextrose 5 % 250 mL IVPB (not administered)     Initial Impression / Assessment and Plan / ED Course  I have reviewed the triage vital signs and the nursing notes.  Pertinent labs & imaging results that were available during my care of the patient were reviewed by me and considered in my medical decision making (see chart for details).     Chest x-ray shows subtle left lower lobe opacity silhouetting the left inferolateral heart border. Patient is 95% on room air and no increased work of breathing. She started on doxycycline yesterday has had only 2 doses of this. I think this would be appropriate coverage for community acquired pneumonia. She is not a smoker.  Patient does not have marked comorbid conditions and is still appropriate for outpatient treatment. I will check labs and give IV Rocephin and likely continue her on home doxycycline.  16:42:  Reassuring labs. Given Rocephin. Appropriate for discharge home continue doxycycline, Mucinex, and primary care follow-up.  Final Clinical Impressions(s) / ED Diagnoses    Final diagnoses:  Community acquired pneumonia of left lower lobe of lung [facility])    New Prescriptions New Prescriptions   No medications on file     Tanna Furry, MD 07/25/16 [place], MD 07/25/16 [phone]

## 2016-07-25 NOTE — ED Triage Notes (Addendum)
Patient here with ongoing cough, congestion and chills for 1 week, has taken 2 antibiotics with no relief. Alert and oriented. Thick sputum with cough, has not had previous CXR

## 2016-07-28 DIAGNOSIS — J181 Lobar pneumonia, unspecified organism: Secondary | ICD-10-CM | POA: Diagnosis not present

## 2016-07-28 DIAGNOSIS — R197 Diarrhea, unspecified: Secondary | ICD-10-CM | POA: Diagnosis not present

## 2016-08-25 ENCOUNTER — Other Ambulatory Visit: Payer: Self-pay | Admitting: Family Medicine

## 2016-08-25 ENCOUNTER — Ambulatory Visit
Admission: RE | Admit: 2016-08-25 | Discharge: 2016-08-25 | Disposition: A | Discharge status: Home / Self Care | Payer: Medicare HMO | Source: Ambulatory Visit | Attending: Family Medicine | Admitting: Family Medicine

## 2016-08-25 DIAGNOSIS — J189 Pneumonia, unspecified organism: Secondary | ICD-10-CM

## 2016-08-25 DIAGNOSIS — J181 Lobar pneumonia, unspecified organism: Principal | ICD-10-CM

## 2016-08-27 DIAGNOSIS — R05 Cough: Secondary | ICD-10-CM | POA: Diagnosis not present

## 2016-08-27 DIAGNOSIS — R69 Illness, unspecified: Secondary | ICD-10-CM | POA: Diagnosis not present

## 2016-09-09 DIAGNOSIS — H43813 Vitreous degeneration, bilateral: Secondary | ICD-10-CM | POA: Diagnosis not present

## 2016-09-09 DIAGNOSIS — H401132 Primary open-angle glaucoma, bilateral, moderate stage: Secondary | ICD-10-CM | POA: Diagnosis not present

## 2016-09-11 DIAGNOSIS — H401111 Primary open-angle glaucoma, right eye, mild stage: Secondary | ICD-10-CM | POA: Diagnosis not present

## 2016-09-11 DIAGNOSIS — H401123 Primary open-angle glaucoma, left eye, severe stage: Secondary | ICD-10-CM | POA: Diagnosis not present

## 2016-09-29 ENCOUNTER — Other Ambulatory Visit: Payer: Self-pay | Admitting: Family Medicine

## 2016-09-29 DIAGNOSIS — Z1231 Encounter for screening mammogram for malignant neoplasm of breast: Secondary | ICD-10-CM

## 2016-10-23 DIAGNOSIS — H401123 Primary open-angle glaucoma, left eye, severe stage: Secondary | ICD-10-CM | POA: Diagnosis not present

## 2016-10-23 DIAGNOSIS — H401111 Primary open-angle glaucoma, right eye, mild stage: Secondary | ICD-10-CM | POA: Diagnosis not present

## 2016-10-26 DIAGNOSIS — R69 Illness, unspecified: Secondary | ICD-10-CM | POA: Diagnosis not present

## 2016-10-26 DIAGNOSIS — E559 Vitamin D deficiency, unspecified: Secondary | ICD-10-CM | POA: Diagnosis not present

## 2016-10-26 DIAGNOSIS — R7309 Other abnormal glucose: Secondary | ICD-10-CM | POA: Diagnosis not present

## 2016-10-26 DIAGNOSIS — M25562 Pain in left knee: Secondary | ICD-10-CM | POA: Diagnosis not present

## 2016-10-26 DIAGNOSIS — Z23 Encounter for immunization: Secondary | ICD-10-CM | POA: Diagnosis not present

## 2016-10-26 DIAGNOSIS — E8809 Other disorders of plasma-protein metabolism, not elsewhere classified: Secondary | ICD-10-CM | POA: Diagnosis not present

## 2016-10-26 DIAGNOSIS — I10 Essential (primary) hypertension: Secondary | ICD-10-CM | POA: Diagnosis not present

## 2016-11-12 ENCOUNTER — Telehealth: Payer: Self-pay | Admitting: Hematology

## 2016-11-12 ENCOUNTER — Encounter: Payer: Self-pay | Admitting: Hematology

## 2016-11-12 NOTE — Telephone Encounter (Signed)
Appt has been scheduled for the pt to see Dr. Irene Limbo on 6/5 at 11am. Pt aware to arrive 30 minutes early. Demographics verified. Pt agreed to the appt date and time. Letter mailed to the pt and faxed to the referring.

## 2016-11-16 ENCOUNTER — Ambulatory Visit
Admission: RE | Admit: 2016-11-16 | Discharge: 2016-11-16 | Disposition: A | Discharge status: Home / Self Care | Payer: Medicare HMO | Source: Ambulatory Visit | Attending: Family Medicine | Admitting: Family Medicine

## 2016-11-16 DIAGNOSIS — Z1231 Encounter for screening mammogram for malignant neoplasm of breast: Secondary | ICD-10-CM | POA: Diagnosis not present

## 2016-11-25 DIAGNOSIS — S80862A Insect bite (nonvenomous), left lower leg, initial encounter: Secondary | ICD-10-CM | POA: Diagnosis not present

## 2016-11-25 DIAGNOSIS — W57XXXA Bitten or stung by nonvenomous insect and other nonvenomous arthropods, initial encounter: Secondary | ICD-10-CM | POA: Diagnosis not present

## 2016-12-01 ENCOUNTER — Telehealth: Payer: Self-pay | Admitting: Hematology

## 2016-12-01 ENCOUNTER — Ambulatory Visit (HOSPITAL_BASED_OUTPATIENT_CLINIC_OR_DEPARTMENT_OTHER): Payer: Medicare HMO | Admitting: Hematology

## 2016-12-01 ENCOUNTER — Encounter: Payer: Self-pay | Admitting: Hematology

## 2016-12-01 ENCOUNTER — Ambulatory Visit (HOSPITAL_BASED_OUTPATIENT_CLINIC_OR_DEPARTMENT_OTHER): Payer: Medicare HMO

## 2016-12-01 VITALS — BP 157/85 | HR 70 | Temp 98.1°F | Resp 19 | Wt 155.1 lb

## 2016-12-01 DIAGNOSIS — E8809 Other disorders of plasma-protein metabolism, not elsewhere classified: Secondary | ICD-10-CM

## 2016-12-01 DIAGNOSIS — D472 Monoclonal gammopathy: Secondary | ICD-10-CM

## 2016-12-01 DIAGNOSIS — D709 Neutropenia, unspecified: Secondary | ICD-10-CM | POA: Diagnosis not present

## 2016-12-01 LAB — CBC & DIFF AND RETIC
BASO%: 0.7 % (ref 0.0–2.0)
Basophils Absolute: 0 10*3/uL (ref 0.0–0.1)
EOS%: 2.4 % (ref 0.0–7.0)
Eosinophils Absolute: 0.1 10*3/uL (ref 0.0–0.5)
HCT: 37.4 % (ref 34.8–46.6)
HGB: 12.6 g/dL (ref 11.6–15.9)
Immature Retic Fract: 2.9 % (ref 1.60–10.00)
LYMPH%: 42.7 % (ref 14.0–49.7)
MCH: 33.1 pg (ref 25.1–34.0)
MCHC: 33.7 g/dL (ref 31.5–36.0)
MCV: 98.2 fL (ref 79.5–101.0)
MONO#: 0.3 10*3/uL (ref 0.1–0.9)
MONO%: 8.5 % (ref 0.0–14.0)
NEUT#: 1.3 10*3/uL — ABNORMAL LOW (ref 1.5–6.5)
NEUT%: 45.7 % (ref 38.4–76.8)
Platelets: 203 10*3/uL (ref 145–400)
RBC: 3.81 10*6/uL (ref 3.70–5.45)
RDW: 14.3 % (ref 11.2–14.5)
Retic %: 1.24 % (ref 0.70–2.10)
Retic Ct Abs: 47.24 10*3/uL (ref 33.70–90.70)
WBC: 2.9 10*3/uL — ABNORMAL LOW (ref 3.9–10.3)
lymph#: 1.3 10*3/uL (ref 0.9–3.3)

## 2016-12-01 LAB — COMPREHENSIVE METABOLIC PANEL
ALT: 23 U/L (ref 0–55)
AST: 24 U/L (ref 5–34)
Albumin: 3.9 g/dL (ref 3.5–5.0)
Alkaline Phosphatase: 85 U/L (ref 40–150)
Anion Gap: 8 mEq/L (ref 3–11)
BUN: 13.5 mg/dL (ref 7.0–26.0)
CO2: 26 mEq/L (ref 22–29)
Calcium: 9.7 mg/dL (ref 8.4–10.4)
Chloride: 108 mEq/L (ref 98–109)
Creatinine: 0.7 mg/dL (ref 0.6–1.1)
EGFR: >90 mL/min/{1.73_m2} (ref >90)
Glucose: 98 mg/dl (ref 70–140)
Potassium: 3.9 mEq/L (ref 3.5–5.1)
Sodium: 141 mEq/L (ref 136–145)
Total Bilirubin: 0.34 mg/dL (ref 0.20–1.20)
Total Protein: 8.8 g/dL — ABNORMAL HIGH (ref 6.4–8.3)

## 2016-12-01 LAB — LACTATE DEHYDROGENASE: LDH: 203 U/L (ref 125–245)

## 2016-12-01 NOTE — Telephone Encounter (Signed)
Gave patient AVS and calender per 6/5 los - [facility] to contact patient for CT biopsy and bone marrow

## 2016-12-01 NOTE — Patient Instructions (Signed)
Thank you for choosing [facility] to provide your oncology and hematology care.  To afford each patient quality time with our providers, please arrive 30 minutes before your scheduled appointment time.  If you arrive late for your appointment, you may be asked to reschedule.  We strive to give you quality time with our providers, and arriving late affects you and other patients whose appointments are after yours.   If you are a no show for multiple scheduled visits, you may be dismissed from the clinic at the providers discretion.    Again, thank you for choosing [facility], our hope is that these requests will decrease the amount of time that you wait before being seen by our physicians.  ______________________________________________________________________  Should you have questions after your visit to the [facility], please contact our office at [phone] between the hours of 8:30 and 4:30 p.m.    Voicemails left after 4:30p.m will not be returned until the following business day.    For prescription refill requests, please have your pharmacy contact us directly.  Please also try to allow 48 hours for prescription requests.    Please contact the scheduling department for questions regarding scheduling.  For scheduling of procedures such as PET scans, CT scans, MRI, Ultrasound, etc please contact central scheduling at [phone].    Resources For Cancer Patients and Caregivers:   Oncolink.org:  A wonderful resource for patients and healthcare providers for information regarding your disease, ways to tract your treatment, what to expect, etc.     American Cancer Society:  800-227-2345  Can help patients locate various types of support and financial assistance  Cancer Care: 1-800-813-HOPE (4673) Provides financial assistance, online support groups, medication/co-pay assistance.    [place] DSS:  336-641-3447 Where to apply for food  stamps, Medicaid, and utility assistance  Medicare Rights Center: 800-333-4114 Helps people with Medicare understand their rights and benefits, navigate the Medicare system, and secure the quality healthcare they deserve  SCAT: 336-333-6589 [employer]'s shared-ride transportation service for eligible riders who have a disability that prevents them from riding the fixed route bus.    For additional information on assistance programs please contact our social worker:   Grier Hock/Abigail Elmore:  [phone]            
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
## 2016-12-02 LAB — FOLATE RBC
Folate, Hemolysate: 402.8 ng/mL
Folate, RBC: 1054 ng/mL (ref >498)
Hematocrit: 38.2 % (ref 34.0–46.6)

## 2016-12-02 LAB — BETA 2 MICROGLOBULIN, SERUM: Beta-2: 1.5 mg/L (ref 0.6–2.4)

## 2016-12-02 LAB — KAPPA/LAMBDA LIGHT CHAINS
Ig Kappa Free Light Chain: 15.8 mg/L (ref 3.3–19.4)
Ig Lambda Free Light Chain: 6.9 mg/L (ref 5.7–26.3)
Kappa/Lambda FluidC Ratio: 2.29 — ABNORMAL HIGH (ref 0.26–1.65)

## 2016-12-02 LAB — VITAMIN B12: Vitamin B12: 818 pg/mL (ref 232–1245)

## 2016-12-02 LAB — SEDIMENTATION RATE: Sedimentation Rate-Westergren: 21 mm/hr (ref 0–40)

## 2016-12-03 ENCOUNTER — Ambulatory Visit (HOSPITAL_COMMUNITY)
Admission: RE | Admit: 2016-12-03 | Discharge: 2016-12-03 | Disposition: A | Discharge status: Home / Self Care | Payer: Medicare HMO | Source: Ambulatory Visit | Attending: Hematology | Admitting: Hematology

## 2016-12-03 DIAGNOSIS — C9000 Multiple myeloma not having achieved remission: Secondary | ICD-10-CM | POA: Diagnosis not present

## 2016-12-03 DIAGNOSIS — D472 Monoclonal gammopathy: Secondary | ICD-10-CM

## 2016-12-03 DIAGNOSIS — E8809 Other disorders of plasma-protein metabolism, not elsewhere classified: Secondary | ICD-10-CM | POA: Diagnosis not present

## 2016-12-03 LAB — MULTIPLE MYELOMA PANEL, SERUM
Albumin SerPl Elph-Mcnc: 3.8 g/dL (ref 2.9–4.4)
Albumin/Glob SerPl: 1 (ref 0.7–1.7)
Alpha 1: 0.3 g/dL (ref 0.0–0.4)
Alpha2 Glob SerPl Elph-Mcnc: 0.8 g/dL (ref 0.4–1.0)
B-Globulin SerPl Elph-Mcnc: 1 g/dL (ref 0.7–1.3)
Gamma Glob SerPl Elph-Mcnc: 2.2 g/dL — ABNORMAL HIGH (ref 0.4–1.8)
Globulin, Total: 4.2 g/dL — ABNORMAL HIGH (ref 2.2–3.9)
IgA, Qn, Serum: 22 mg/dL — ABNORMAL LOW (ref 64–422)
IgG, Qn, Serum: 2140 mg/dL — ABNORMAL HIGH (ref 700–1600)
IgM, Qn, Serum: 42 mg/dL (ref 26–217)
M Protein SerPl Elph-Mcnc: 1.9 g/dL — ABNORMAL HIGH
Total Protein: 8 g/dL (ref 6.0–8.5)

## 2016-12-09 DIAGNOSIS — Z79899 Other long term (current) drug therapy: Secondary | ICD-10-CM | POA: Diagnosis not present

## 2016-12-09 DIAGNOSIS — E8809 Other disorders of plasma-protein metabolism, not elsewhere classified: Secondary | ICD-10-CM | POA: Diagnosis not present

## 2016-12-09 DIAGNOSIS — D472 Monoclonal gammopathy: Secondary | ICD-10-CM | POA: Diagnosis not present

## 2016-12-09 DIAGNOSIS — E785 Hyperlipidemia, unspecified: Secondary | ICD-10-CM | POA: Diagnosis not present

## 2016-12-10 LAB — UPEP/UIFE/LIGHT CHAINS/TP, 24-HR UR
% BETA, Urine: 0 %
ALBUMIN, U: 100 %
ALPHA 1 URINE: 0 %
ALPHA-2-GLOBULIN, U: 0 %
Free Kappa Lt Chains,Ur: 12 mg/L (ref 1.35–24.19)
Free Lambda Lt Chains,Ur: 0.19 mg/L — ABNORMAL LOW (ref 0.24–6.66)
GAMMA GLOBULIN URINE: 0 %
Kappa/Lambda Ratio,U: 63.16 — ABNORMAL HIGH (ref 2.04–10.37)
PROTEIN,TOTAL,URINE: 6.2 mg/dL
Prot,24hr calculated: 71 mg/24 hr (ref 30–150)

## 2016-12-11 ENCOUNTER — Other Ambulatory Visit: Payer: Self-pay | Admitting: Radiology

## 2016-12-14 ENCOUNTER — Ambulatory Visit (HOSPITAL_COMMUNITY)
Admission: RE | Admit: 2016-12-14 | Discharge: 2016-12-14 | Disposition: A | Discharge status: Home / Self Care | Payer: Medicare HMO | Source: Ambulatory Visit | Attending: Hematology | Admitting: Hematology

## 2016-12-14 ENCOUNTER — Encounter (HOSPITAL_COMMUNITY): Payer: Self-pay

## 2016-12-14 DIAGNOSIS — E8809 Other disorders of plasma-protein metabolism, not elsewhere classified: Secondary | ICD-10-CM

## 2016-12-14 DIAGNOSIS — Z79899 Other long term (current) drug therapy: Secondary | ICD-10-CM | POA: Diagnosis not present

## 2016-12-14 DIAGNOSIS — I10 Essential (primary) hypertension: Secondary | ICD-10-CM | POA: Diagnosis not present

## 2016-12-14 DIAGNOSIS — D72822 Plasmacytosis: Secondary | ICD-10-CM | POA: Insufficient documentation

## 2016-12-14 DIAGNOSIS — D472 Monoclonal gammopathy: Secondary | ICD-10-CM | POA: Diagnosis not present

## 2016-12-14 DIAGNOSIS — D709 Neutropenia, unspecified: Secondary | ICD-10-CM | POA: Diagnosis not present

## 2016-12-14 DIAGNOSIS — Z7982 Long term (current) use of aspirin: Secondary | ICD-10-CM | POA: Diagnosis not present

## 2016-12-14 LAB — CBC WITH DIFFERENTIAL/PLATELET
Basophils Absolute: 0 10*3/uL (ref 0.0–0.1)
Basophils Relative: 1 %
Eosinophils Absolute: 0.1 10*3/uL (ref 0.0–0.7)
Eosinophils Relative: 2 %
HCT: 35 % — ABNORMAL LOW (ref 36.0–46.0)
Hemoglobin: 12.2 g/dL (ref 12.0–15.0)
Lymphocytes Relative: 52 %
Lymphs Abs: 1.5 10*3/uL (ref 0.7–4.0)
MCH: 32.8 pg (ref 26.0–34.0)
MCHC: 34.9 g/dL (ref 30.0–36.0)
MCV: 94.1 fL (ref 78.0–100.0)
Monocytes Absolute: 0.3 10*3/uL (ref 0.1–1.0)
Monocytes Relative: 9 %
Neutro Abs: 1 10*3/uL — ABNORMAL LOW (ref 1.7–7.7)
Neutrophils Relative %: 36 %
Platelets: 199 10*3/uL (ref 150–400)
RBC: 3.72 MIL/uL — ABNORMAL LOW (ref 3.87–5.11)
RDW: 14 % (ref 11.5–15.5)
WBC: 2.8 10*3/uL — ABNORMAL LOW (ref 4.0–10.5)

## 2016-12-14 LAB — PROTIME-INR
INR: 0.98
Prothrombin Time: 13 seconds (ref 11.4–15.2)

## 2016-12-14 MED ORDER — FENTANYL CITRATE (PF) 100 MCG/2ML IJ SOLN
INTRAMUSCULAR | Status: AC
  Filled 2016-12-14: qty 4

## 2016-12-14 MED ORDER — FLUMAZENIL 0.5 MG/5ML IV SOLN
INTRAVENOUS | Status: AC
  Filled 2016-12-14: qty 5

## 2016-12-14 MED ORDER — MIDAZOLAM HCL 2 MG/2ML IJ SOLN
INTRAMUSCULAR | Status: AC
  Filled 2016-12-14: qty 4

## 2016-12-14 MED ORDER — MIDAZOLAM HCL 2 MG/2ML IJ SOLN
INTRAMUSCULAR | Status: AC | PRN
  Administered 2016-12-14 (×2): 1 mg via INTRAVENOUS

## 2016-12-14 MED ORDER — NALOXONE HCL 0.4 MG/ML IJ SOLN
INTRAMUSCULAR | Status: AC
  Filled 2016-12-14: qty 1

## 2016-12-14 MED ORDER — HYDROCODONE-ACETAMINOPHEN 5-325 MG PO TABS: 1.0000 | ORAL_TABLET | ORAL | Status: DC | PRN

## 2016-12-14 MED ORDER — SODIUM CHLORIDE 0.9 % IV SOLN
INTRAVENOUS | Status: DC
  Administered 2016-12-14: 09:00:00 via INTRAVENOUS

## 2016-12-14 MED ORDER — LIDOCAINE HCL (PF) 1 % IJ SOLN
INTRAMUSCULAR | Status: AC | PRN
  Administered 2016-12-14: 10 mL via INTRADERMAL

## 2016-12-14 MED ORDER — FENTANYL CITRATE (PF) 100 MCG/2ML IJ SOLN
INTRAMUSCULAR | Status: AC | PRN
  Administered 2016-12-14 (×2): 50 ug via INTRAVENOUS

## 2016-12-14 NOTE — Consult Note (Signed)
Chief Complaint: Patient was seen in consultation today for CT-guided bone marrow biopsy  Referring Physician(s): Brunetta Genera  Supervising Physician: Arne Cleveland  Patient Status: [name]  History of Present Illness: Tracy Fisher is a 71 y.o. female with history of plasma cell dyscrasia/monoclonal paraproteinemia who presents today for CT-guided bone marrow biopsy for further evaluation.  Past Medical History:  Diagnosis Date  . Hypertension     History reviewed. No pertinent surgical history.  Allergies: Sulfa antibiotics  Medications: Prior to Admission medications   Medication Sig Start Date End Date Taking? Authorizing Provider  amLODipine (NORVASC) 5 MG tablet  02/01/15  Yes [provider]  aspirin EC 81 MG tablet Take 81 mg by mouth daily.   Yes [provider]  atorvastatin (LIPITOR) 10 MG tablet  11/25/16  Yes [provider]  Calcium Carbonate-Vitamin D (CALCIUM + D PO) Take 1 tablet by mouth daily.   Yes [provider]  Cholecalciferol 1000 UNITS TBDP Take 4,000 Units by mouth daily.   Yes [provider]  dorzolamide-timolol (COSOPT) 22.3-6.8 MG/ML ophthalmic solution PLACE 1 DROP IN BOTH EYES TWICE A DAY 11/21/16  Yes [provider]  doxycycline (VIBRA-TABS) 100 MG tablet  11/25/16  Yes [provider]  latanoprost (XALATAN) 0.005 % ophthalmic solution Place 1 drop into both eyes at bedtime.   Yes [provider]  mirtazapine (REMERON) 15 MG tablet Take 15 mg by mouth at bedtime.   Yes [provider]  Multiple Vitamin (MULTIVITAMIN) capsule Take by mouth.   Yes [provider]     Family History  Problem Relation Age of Onset  . Breast cancer Neg Hx     Social History   Social History  . Marital status: Divorced    Spouse name: N/A  . Number of children: N/A  . Years of education: N/A   Social History Main Topics  . Smoking status: Never Smoker    . Smokeless tobacco: Never Used  . Alcohol use No  . Drug use: Unknown  . Sexual activity: Not Asked   Other Topics Concern  . None   Social History Narrative  . None      Review of Systems Currently denies fever, headache, chest pain, dyspnea, cough, abdominal/back pain, nausea, vomiting or abnormal bleeding.  Vital Signs: BP 137/77 (BP Location: Right Arm)   Pulse 69   Temp 98 F (36.7 C) (Oral)   Resp 16   SpO2 98%   Physical Exam Awake, alert. Chest clear to auscultation bilaterally. Heart with regular rate and rhythm. Abdomen soft, positive bowel sounds, nontender; lower extremities with no edema.  Mallampati Score:     Imaging: Dg Bone Survey Met  Result Date: 12/03/2016 CLINICAL DATA:  Multiple myeloma EXAM: METASTATIC BONE SURVEY COMPARISON:  Chest radiograph 08/25/2016 FINDINGS: Normal heart size, mediastinal contours and pulmonary vascularity. Atherosclerotic calcification aortic arch. Lungs clear. No pleural effusion or pneumothorax. Mild diffuse osseous demineralization. External artifacts project over calvarium. Mild degenerative disc disease changes of the cervical, thoracic, and lumbar spine. No lytic lesions are identified to suggest multiple myeloma IMPRESSION: No radiographic evidence of osseous involvement by multiple myeloma. Electronically Signed   By: Lavonia Dana M.D.   On: 12/03/2016 12:22   Mm Digital Screening Bilateral  Result Date: 11/17/2016 CLINICAL DATA:  Screening. EXAM: DIGITAL SCREENING BILATERAL MAMMOGRAM WITH CAD COMPARISON:  Previous exam(s). ACR Breast Density Category b: There are scattered areas of fibroglandular density. FINDINGS: There are no  findings suspicious for malignancy. Images were processed with CAD. IMPRESSION: No mammographic evidence of malignancy. A result letter of this screening mammogram will be mailed directly to the patient. RECOMMENDATION: Screening mammogram in one year. (Code:SM-B-01Y) BI-RADS CATEGORY  1: Negative.  Electronically Signed   By: Everlean Alstrom M.D.   On: 11/17/2016 07:27    Labs:  CBC:  Recent Labs  07/25/16 1530 12/01/16 1235 12/01/16 1238 12/14/16 0907  WBC 2.1* 2.9*  --  2.8*  HGB 13.0 12.6  --  12.2  HCT 38.1 37.4 38.2 35.0*  PLT 224 203  --  199    COAGS: No results for input(s): INR, APTT in the last 8760 hours.  BMP:  Recent Labs  07/25/16 1530 12/01/16 1235  NA 138 141  K 3.4* 3.9  CL 105  --   CO2 23 26  GLUCOSE 91 98  BUN 11 13.5  CALCIUM 9.3 9.7  CREATININE 0.97 0.7  GFRNONAA 58*  --   GFRAA >60  --     LIVER FUNCTION TESTS:  Recent Labs  07/25/16 1530 12/01/16 1235 12/01/16 1238  BILITOT 0.4 0.34  --   AST 43* 24  --   ALT 29 23  --   ALKPHOS 65 85  --   PROT 8.4* 8.8* 8.0  ALBUMIN 3.9 3.9  --     TUMOR MARKERS: No results for input(s): AFPTM, CEA, CA199, CHROMGRNA in the last 8760 hours.  Assessment and Plan: 71 y.o. female with history of plasma cell dyscrasia/monoclonal paraproteinemia who presents today for CT-guided bone marrow biopsy for further evaluation. Risks and benefits discussed with the patient/spouse including, but not limited to bleeding, infection, damage to adjacent structures or low yield requiring additional tests.All of the patient's questions were answered, patient is agreeable to proceed.Consent signed and in chart.     Thank you for this interesting consult.  I greatly enjoyed meeting Tracy Fisher and look forward to participating in their care.  A copy of this report was sent to the requesting provider on this date.  Electronically Signed: D. Rowe Robert, PA-C 12/14/2016, 9:36 AM   I spent a total of 20 minutes  in face to face in clinical consultation, greater than 50% of which was counseling/coordinating care for CT-guided bone marrow biopsy

## 2016-12-14 NOTE — Procedures (Signed)
   CT-guided  R iliac bone marrow aspiration and core biopsy No complication No blood loss. See complete dictation in [facility] PACS   D. Noheli Melder MD Main # [phone] Pager  [phone]      

## 2016-12-14 NOTE — Discharge Instructions (Signed)

## 2016-12-15 ENCOUNTER — Encounter: Payer: Self-pay | Admitting: Hematology

## 2016-12-15 ENCOUNTER — Telehealth: Payer: Self-pay | Admitting: Hematology

## 2016-12-15 ENCOUNTER — Ambulatory Visit (HOSPITAL_BASED_OUTPATIENT_CLINIC_OR_DEPARTMENT_OTHER): Payer: Medicare HMO | Admitting: Hematology

## 2016-12-15 VITALS — BP 130/75 | HR 77 | Temp 98.6°F | Resp 18 | Wt 153.8 lb

## 2016-12-15 DIAGNOSIS — D709 Neutropenia, unspecified: Secondary | ICD-10-CM

## 2016-12-15 DIAGNOSIS — D472 Monoclonal gammopathy: Secondary | ICD-10-CM | POA: Diagnosis not present

## 2016-12-15 DIAGNOSIS — E8809 Other disorders of plasma-protein metabolism, not elsewhere classified: Secondary | ICD-10-CM

## 2016-12-15 NOTE — Patient Instructions (Signed)
Thank you for choosing [facility] to provide your oncology and hematology care.  To afford each patient quality time with our providers, please arrive 30 minutes before your scheduled appointment time.  If you arrive late for your appointment, you may be asked to reschedule.  We strive to give you quality time with our providers, and arriving late affects you and other patients whose appointments are after yours.  If you are a no show for multiple scheduled visits, you may be dismissed from the clinic at the providers discretion.   Again, thank you for choosing [facility], our hope is that these requests will decrease the amount of time that you wait before being seen by our physicians.  ______________________________________________________________________ Should you have questions after your visit to the [facility], please contact our office at [phone] between the hours of 8:30 and 4:30 p.m.    Voicemails left after 4:30p.m will not be returned until the following business day.   For prescription refill requests, please have your pharmacy contact us directly.  Please also try to allow 48 hours for prescription requests.   Please contact the scheduling department for questions regarding scheduling.  For scheduling of procedures such as PET scans, CT scans, MRI, Ultrasound, etc please contact central scheduling at [phone].   Resources For Cancer Patients and Caregivers:  American Cancer Society:  800-227-2345  Can help patients locate various types of support and financial assistance Cancer Care: 1-800-813-HOPE (4673) Provides financial assistance, online support groups, medication/co-pay assistance.   [place] DSS:  336-641-3447 Where to apply for food stamps, Medicaid, and utility assistance Medicare Rights Center: 800-333-4114 Helps people with Medicare understand their rights and benefits, navigate the Medicare system, and secure the  quality healthcare they deserve SCAT: 336-333-6589 [place] [employer]'s shared-ride transportation service for eligible riders who have a disability that prevents them from riding the fixed route bus.   For additional information on assistance programs please contact our social worker:   Grier Hock/Abigail Elmore:  [phone] 

## 2016-12-15 NOTE — Telephone Encounter (Signed)
Scheduled appt per 6/19 los. Gave patient AVS and calender per LOS - lab and f/u in 3 months.

## 2016-12-17 DIAGNOSIS — I10 Essential (primary) hypertension: Secondary | ICD-10-CM | POA: Diagnosis not present

## 2016-12-17 DIAGNOSIS — Z0000 Encounter for general adult medical examination without abnormal findings: Secondary | ICD-10-CM | POA: Diagnosis not present

## 2016-12-17 DIAGNOSIS — E785 Hyperlipidemia, unspecified: Secondary | ICD-10-CM | POA: Diagnosis not present

## 2016-12-17 DIAGNOSIS — Z6827 Body mass index (BMI) 27.0-27.9, adult: Secondary | ICD-10-CM | POA: Diagnosis not present

## 2016-12-17 DIAGNOSIS — E663 Overweight: Secondary | ICD-10-CM | POA: Diagnosis not present

## 2016-12-17 DIAGNOSIS — H409 Unspecified glaucoma: Secondary | ICD-10-CM | POA: Diagnosis not present

## 2016-12-17 DIAGNOSIS — R69 Illness, unspecified: Secondary | ICD-10-CM | POA: Diagnosis not present

## 2016-12-17 DIAGNOSIS — Z7982 Long term (current) use of aspirin: Secondary | ICD-10-CM | POA: Diagnosis not present

## 2016-12-21 DIAGNOSIS — B9789 Other viral agents as the cause of diseases classified elsewhere: Secondary | ICD-10-CM | POA: Diagnosis not present

## 2016-12-21 DIAGNOSIS — S40861A Insect bite (nonvenomous) of right upper arm, initial encounter: Secondary | ICD-10-CM | POA: Diagnosis not present

## 2016-12-21 DIAGNOSIS — W57XXXA Bitten or stung by nonvenomous insect and other nonvenomous arthropods, initial encounter: Secondary | ICD-10-CM | POA: Diagnosis not present

## 2016-12-21 DIAGNOSIS — J069 Acute upper respiratory infection, unspecified: Secondary | ICD-10-CM | POA: Diagnosis not present

## 2016-12-26 DIAGNOSIS — R69 Illness, unspecified: Secondary | ICD-10-CM | POA: Diagnosis not present

## 2016-12-28 ENCOUNTER — Encounter (HOSPITAL_COMMUNITY): Payer: Self-pay

## 2017-01-06 ENCOUNTER — Telehealth: Payer: Self-pay

## 2017-01-06 ENCOUNTER — Telehealth: Payer: Self-pay | Admitting: *Deleted

## 2017-01-06 NOTE — Telephone Encounter (Signed)
Pt called to reinforce need for information to be sent to Dr. Harlan Stains at [facility]. Drema Halon, and they are aware of MD out-of-office at this time per phone call with Mackie Pai, RN yesterday. Called pt to let her know. We will send paperwork as soon as we can. OV from 6/6 and 6/19 unavailable at this time. Pt verbalized understanding.

## 2017-01-06 NOTE — Telephone Encounter (Addendum)
"  Tracy Fisher with [facility], Dr. Harlan Stains.  When was this patient seen?  Calling for complete office notes for this patient to be faxed to our office. Fax number is [phone].  Tried EPIC, see a summary.  Dr. Dema Severin needs full note to see what's going on before we see patient again."  Patient seen June 5th and 19th.  Will notify provider.  Expected return to office 01-12-2017.

## 2017-01-07 LAB — CHROMOSOME ANALYSIS, BONE MARROW

## 2017-01-07 LAB — TISSUE HYBRIDIZATION (BONE MARROW)-NCBH

## 2017-01-22 ENCOUNTER — Telehealth: Payer: Self-pay
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
## 2017-01-22 NOTE — Telephone Encounter (Signed)
OV notes from 6/5 and 6/19 requested. Fax receipt confirmed.

## 2017-01-25 DIAGNOSIS — R69 Illness, unspecified: Secondary | ICD-10-CM | POA: Diagnosis not present

## 2017-02-09 DIAGNOSIS — Z79899 Other long term (current) drug therapy: Secondary | ICD-10-CM | POA: Diagnosis not present

## 2017-02-09 DIAGNOSIS — E785 Hyperlipidemia, unspecified: Secondary | ICD-10-CM | POA: Diagnosis not present

## 2017-02-23 DIAGNOSIS — Z961 Presence of intraocular lens: Secondary | ICD-10-CM | POA: Diagnosis not present

## 2017-02-23 DIAGNOSIS — H401111 Primary open-angle glaucoma, right eye, mild stage: Secondary | ICD-10-CM | POA: Diagnosis not present

## 2017-02-23 DIAGNOSIS — H401123 Primary open-angle glaucoma, left eye, severe stage: Secondary | ICD-10-CM | POA: Diagnosis not present
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
## 2017-03-17 ENCOUNTER — Telehealth: Payer: Self-pay | Admitting: Hematology

## 2017-03-17 ENCOUNTER — Ambulatory Visit: Payer: Medicare HMO

## 2017-03-17 ENCOUNTER — Encounter: Payer: Self-pay | Admitting: Hematology

## 2017-03-17 ENCOUNTER — Other Ambulatory Visit (HOSPITAL_BASED_OUTPATIENT_CLINIC_OR_DEPARTMENT_OTHER): Payer: Medicare HMO

## 2017-03-17 ENCOUNTER — Ambulatory Visit (HOSPITAL_BASED_OUTPATIENT_CLINIC_OR_DEPARTMENT_OTHER): Payer: Medicare HMO | Admitting: Hematology

## 2017-03-17 VITALS — BP 133/91 | HR 69 | Temp 97.8°F | Resp 18 | Ht 64.0 in | Wt 152.3 lb

## 2017-03-17 DIAGNOSIS — C9000 Multiple myeloma not having achieved remission: Secondary | ICD-10-CM | POA: Diagnosis not present

## 2017-03-17 DIAGNOSIS — D472 Monoclonal gammopathy: Secondary | ICD-10-CM | POA: Diagnosis not present

## 2017-03-17 DIAGNOSIS — E8809 Other disorders of plasma-protein metabolism, not elsewhere classified: Secondary | ICD-10-CM

## 2017-03-17 DIAGNOSIS — D709 Neutropenia, unspecified: Secondary | ICD-10-CM

## 2017-03-17 LAB — CBC & DIFF AND RETIC
BASO%: 0.7 % (ref 0.0–2.0)
Basophils Absolute: 0 10*3/uL (ref 0.0–0.1)
EOS%: 3.3 % (ref 0.0–7.0)
Eosinophils Absolute: 0.1 10*3/uL (ref 0.0–0.5)
HCT: 37.6 % (ref 34.8–46.6)
HGB: 12.7 g/dL (ref 11.6–15.9)
Immature Retic Fract: 4 % (ref 1.60–10.00)
LYMPH%: 46.2 % (ref 14.0–49.7)
MCH: 33.3 pg (ref 25.1–34.0)
MCHC: 33.8 g/dL (ref 31.5–36.0)
MCV: 98.7 fL (ref 79.5–101.0)
MONO#: 0.3 10*3/uL (ref 0.1–0.9)
MONO%: 11.4 % (ref 0.0–14.0)
NEUT#: 1.1 10*3/uL — ABNORMAL LOW (ref 1.5–6.5)
NEUT%: 38.4 % (ref 38.4–76.8)
Platelets: 182 10*3/uL (ref 145–400)
RBC: 3.81 10*6/uL (ref 3.70–5.45)
RDW: 14.1 % (ref 11.2–14.5)
Retic %: 1.12 % (ref 0.70–2.10)
Retic Ct Abs: 42.67 10*3/uL (ref 33.70–90.70)
WBC: 2.7 10*3/uL — ABNORMAL LOW (ref 3.9–10.3)
lymph#: 1.3 10*3/uL (ref 0.9–3.3)

## 2017-03-17 LAB — COMPREHENSIVE METABOLIC PANEL
ALT: 23 U/L (ref 0–55)
AST: 24 U/L (ref 5–34)
Albumin: 3.8 g/dL (ref 3.5–5.0)
Alkaline Phosphatase: 79 U/L (ref 40–150)
Anion Gap: 6 mEq/L (ref 3–11)
BUN: 13.1 mg/dL (ref 7.0–26.0)
CO2: 27 mEq/L (ref 22–29)
Calcium: 10 mg/dL (ref 8.4–10.4)
Chloride: 107 mEq/L (ref 98–109)
Creatinine: 0.8 mg/dL (ref 0.6–1.1)
EGFR: 86 mL/min/{1.73_m2} — ABNORMAL LOW (ref >90)
Glucose: 105 mg/dl (ref 70–140)
Potassium: 4.2 mEq/L (ref 3.5–5.1)
Sodium: 140 mEq/L (ref 136–145)
Total Bilirubin: 0.38 mg/dL (ref 0.20–1.20)
Total Protein: 8.9 g/dL — ABNORMAL HIGH (ref 6.4–8.3)

## 2017-03-17 LAB — LACTATE DEHYDROGENASE: LDH: 179 U/L (ref 125–245)

## 2017-03-17 NOTE — Patient Instructions (Signed)
Thank you for choosing [facility] to provide your oncology and hematology care.  To afford each patient quality time with our providers, please arrive 30 minutes before your scheduled appointment time.  If you arrive late for your appointment, you may be asked to reschedule.  We strive to give you quality time with our providers, and arriving late affects you and other patients whose appointments are after yours.   If you are a no show for multiple scheduled visits, you may be dismissed from the clinic at the providers discretion.    Again, thank you for choosing [facility], our hope is that these requests will decrease the amount of time that you wait before being seen by our physicians.  ______________________________________________________________________  Should you have questions after your visit to the  Cancer Center, please contact our office at [phone] between the hours of 8:30 and 4:30 p.m.    Voicemails left after 4:30p.m will not be returned until the following business day.    For prescription refill requests, please have your pharmacy contact us directly.  Please also try to allow 48 hours for prescription requests.    Please contact the scheduling department for questions regarding scheduling.  For scheduling of procedures such as PET scans, CT scans, MRI, Ultrasound, etc please contact central scheduling at [phone].    Resources For Cancer Patients and Caregivers:   Oncolink.org:  A wonderful resource for patients and healthcare providers for information regarding your disease, ways to tract your treatment, what to expect, etc.     American Cancer Society:  800-227-2345  Can help patients locate various types of support and financial assistance  Cancer Care: 1-800-813-HOPE (4673) Provides financial assistance, online support groups, medication/co-pay assistance.    [place] DSS:  336-641-3447 Where to apply for food  stamps, Medicaid, and utility assistance  Medicare Rights Center: 800-333-4114 Helps people with Medicare understand their rights and benefits, navigate the Medicare system, and secure the quality healthcare they deserve  SCAT: 336-333-6589 [place] [employer]'s shared-ride transportation service for eligible riders who have a disability that prevents them from riding the fixed route bus.    For additional information on assistance programs please contact our social worker:   Grier Hock/Abigail Elmore:  [phone]            

## 2017-03-17 NOTE — Telephone Encounter (Signed)
Scheduled appt per 9/19 los - Gave patient AVS and calender per los. 

## 2017-03-18 LAB — KAPPA/LAMBDA LIGHT CHAINS
Ig Kappa Free Light Chain: 15.7 mg/L (ref 3.3–19.4)
Ig Lambda Free Light Chain: 7 mg/L (ref 5.7–26.3)
Kappa/Lambda FluidC Ratio: 2.24 — ABNORMAL HIGH (ref 0.26–1.65)

## 2017-03-22 LAB — MULTIPLE MYELOMA PANEL, SERUM
Albumin SerPl Elph-Mcnc: 3.9 g/dL (ref 2.9–4.4)
Albumin/Glob SerPl: 1 (ref 0.7–1.7)
Alpha 1: 0.2 g/dL (ref 0.0–0.4)
Alpha2 Glob SerPl Elph-Mcnc: 0.8 g/dL (ref 0.4–1.0)
B-Globulin SerPl Elph-Mcnc: 0.9 g/dL (ref 0.7–1.3)
Gamma Glob SerPl Elph-Mcnc: 2.4 g/dL — ABNORMAL HIGH (ref 0.4–1.8)
Globulin, Total: 4.3 g/dL — ABNORMAL HIGH (ref 2.2–3.9)
IgA, Qn, Serum: 21 mg/dL — ABNORMAL LOW (ref 64–422)
IgG, Qn, Serum: 2514 mg/dL — ABNORMAL HIGH (ref 700–1600)
IgM, Qn, Serum: 41 mg/dL (ref 26–217)
M Protein SerPl Elph-Mcnc: 2.2 g/dL — ABNORMAL HIGH
Total Protein: 8.2 g/dL (ref 6.0–8.5)

## 2017-03-29 DIAGNOSIS — D472 Monoclonal gammopathy: Secondary | ICD-10-CM | POA: Diagnosis not present

## 2017-03-31 ENCOUNTER — Telehealth: Payer: Self-pay | Admitting: Hematology

## 2017-03-31 DIAGNOSIS — Z1211 Encounter for screening for malignant neoplasm of colon: Secondary | ICD-10-CM | POA: Diagnosis not present

## 2017-03-31 DIAGNOSIS — K640 First degree hemorrhoids: Secondary | ICD-10-CM | POA: Diagnosis not present

## 2017-03-31 NOTE — Telephone Encounter (Signed)
Spoke with patient regarding her future appointments.

## 2017-05-04 DIAGNOSIS — D472 Monoclonal gammopathy: Secondary | ICD-10-CM | POA: Diagnosis not present

## 2017-05-04 DIAGNOSIS — M545 Low back pain: Secondary | ICD-10-CM | POA: Diagnosis not present

## 2017-05-04 DIAGNOSIS — E785 Hyperlipidemia, unspecified: Secondary | ICD-10-CM | POA: Diagnosis not present

## 2017-05-04 DIAGNOSIS — R7309 Other abnormal glucose: Secondary | ICD-10-CM | POA: Diagnosis not present

## 2017-05-04 DIAGNOSIS — R69 Illness, unspecified: Secondary | ICD-10-CM | POA: Diagnosis not present

## 2017-05-04 DIAGNOSIS — E559 Vitamin D deficiency, unspecified: Secondary | ICD-10-CM | POA: Diagnosis not present

## 2017-05-04 DIAGNOSIS — I10 Essential (primary) hypertension: Secondary | ICD-10-CM | POA: Diagnosis not present

## 2017-05-04 DIAGNOSIS — Z0000 Encounter for general adult medical examination without abnormal findings: Secondary | ICD-10-CM | POA: Diagnosis not present

## 2017-05-04 DIAGNOSIS — J309 Allergic rhinitis, unspecified: Secondary | ICD-10-CM | POA: Diagnosis not present

## 2017-05-04 DIAGNOSIS — R21 Rash and other nonspecific skin eruption: Secondary | ICD-10-CM | POA: Diagnosis not present

## 2017-05-07 DIAGNOSIS — B360 Pityriasis versicolor: Secondary | ICD-10-CM | POA: Diagnosis not present

## 2017-06-30 DIAGNOSIS — Z961 Presence of intraocular lens: Secondary | ICD-10-CM | POA: Diagnosis not present

## 2017-06-30 DIAGNOSIS — H401133 Primary open-angle glaucoma, bilateral, severe stage: Secondary | ICD-10-CM | POA: Diagnosis not present

## 2017-07-16 ENCOUNTER — Inpatient Hospital Stay: Payer: Medicare HMO

## 2017-07-16 ENCOUNTER — Encounter: Payer: Self-pay | Admitting: Hematology

## 2017-07-16 ENCOUNTER — Inpatient Hospital Stay: Payer: Medicare HMO | Attending: Hematology | Admitting: Hematology

## 2017-07-16 ENCOUNTER — Telehealth: Payer: Self-pay | Admitting: Hematology

## 2017-07-16 VITALS — BP 149/83 | HR 76 | Temp 98.4°F | Resp 20 | Ht 64.0 in | Wt 158.0 lb

## 2017-07-16 DIAGNOSIS — D709 Neutropenia, unspecified: Secondary | ICD-10-CM | POA: Diagnosis not present

## 2017-07-16 DIAGNOSIS — D472 Monoclonal gammopathy: Secondary | ICD-10-CM

## 2017-07-16 DIAGNOSIS — C9000 Multiple myeloma not having achieved remission: Secondary | ICD-10-CM | POA: Insufficient documentation

## 2017-07-16 LAB — COMPREHENSIVE METABOLIC PANEL
ALT: 32 U/L (ref 0–55)
AST: 26 U/L (ref 5–34)
Albumin: 3.8 g/dL (ref 3.5–5.0)
Alkaline Phosphatase: 77 U/L (ref 40–150)
Anion gap: 7 (ref 3–11)
BUN: 13 mg/dL (ref 7–26)
CO2: 24 mmol/L (ref 22–29)
Calcium: 9.4 mg/dL (ref 8.4–10.4)
Chloride: 107 mmol/L (ref 98–109)
Creatinine, Ser: 0.75 mg/dL (ref 0.60–1.10)
GFR calc Af Amer: >60 mL/min (ref >60)
GFR calc non Af Amer: >60 mL/min (ref >60)
Glucose, Bld: 100 mg/dL (ref 70–140)
Potassium: 4 mmol/L (ref 3.3–4.7)
Sodium: 138 mmol/L (ref 136–145)
Total Bilirubin: 0.3 mg/dL (ref 0.2–1.2)
Total Protein: 8.7 g/dL — ABNORMAL HIGH (ref 6.4–8.3)

## 2017-07-16 LAB — CBC WITH DIFFERENTIAL (CANCER CENTER ONLY)
Basophils Absolute: 0 10*3/uL (ref 0.0–0.1)
Basophils Relative: 1 %
Eosinophils Absolute: 0.1 10*3/uL (ref 0.0–0.5)
Eosinophils Relative: 2 %
HCT: 37.4 % (ref 34.8–46.6)
Hemoglobin: 12.6 g/dL (ref 11.6–15.9)
Lymphocytes Relative: 38 %
Lymphs Abs: 1.2 10*3/uL (ref 0.9–3.3)
MCH: 33.6 pg (ref 25.1–34.0)
MCHC: 33.7 g/dL (ref 31.5–36.0)
MCV: 99.7 fL (ref 79.5–101.0)
Monocytes Absolute: 0.4 10*3/uL (ref 0.1–0.9)
Monocytes Relative: 13 %
Neutro Abs: 1.4 10*3/uL — ABNORMAL LOW (ref 1.5–6.5)
Neutrophils Relative %: 46 %
Platelet Count: 209 10*3/uL (ref 145–400)
RBC: 3.75 MIL/uL (ref 3.70–5.45)
RDW: 14.5 % (ref 11.2–16.1)
WBC Count: 3.1 10*3/uL — ABNORMAL LOW (ref 3.9–10.3)

## 2017-07-16 LAB — RETICULOCYTES
RBC.: 3.75 MIL/uL (ref 3.70–5.45)
Retic Count, Absolute: 41.3 10*3/uL (ref 33.7–90.7)
Retic Ct Pct: 1.1 % (ref 0.7–2.1)

## 2017-07-16 NOTE — Telephone Encounter (Signed)
Gave avs and calendar for march and may  °
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
## 2017-07-19 LAB — KAPPA/LAMBDA LIGHT CHAINS
Kappa free light chain: 20.4 mg/L — ABNORMAL HIGH (ref 3.3–19.4)
Kappa, lambda light chain ratio: 2.83 — ABNORMAL HIGH (ref 0.26–1.65)
Lambda free light chains: 7.2 mg/L (ref 5.7–26.3)

## 2017-07-21 DIAGNOSIS — J069 Acute upper respiratory infection, unspecified: Secondary | ICD-10-CM | POA: Diagnosis not present

## 2017-07-21 DIAGNOSIS — M898X1 Other specified disorders of bone, shoulder: Secondary | ICD-10-CM | POA: Diagnosis not present

## 2017-07-21 LAB — MULTIPLE MYELOMA PANEL, SERUM
Albumin SerPl Elph-Mcnc: 4.1 g/dL (ref 2.9–4.4)
Albumin/Glob SerPl: 1 (ref 0.7–1.7)
Alpha 1: 0.2 g/dL (ref 0.0–0.4)
Alpha2 Glob SerPl Elph-Mcnc: 0.7 g/dL (ref 0.4–1.0)
B-Globulin SerPl Elph-Mcnc: 0.9 g/dL (ref 0.7–1.3)
Gamma Glob SerPl Elph-Mcnc: 2.4 g/dL — ABNORMAL HIGH (ref 0.4–1.8)
Globulin, Total: 4.2 g/dL — ABNORMAL HIGH (ref 2.2–3.9)
IgA: 23 mg/dL — ABNORMAL LOW (ref 64–422)
IgG (Immunoglobin G), Serum: 2891 mg/dL — ABNORMAL HIGH (ref 700–1600)
IgM (Immunoglobulin M), Srm: 39 mg/dL (ref 26–217)
M Protein SerPl Elph-Mcnc: 2 g/dL — ABNORMAL HIGH
Total Protein ELP: 8.3 g/dL (ref 6.0–8.5)

## 2017-08-24 DIAGNOSIS — R7303 Prediabetes: Secondary | ICD-10-CM | POA: Diagnosis not present

## 2017-08-24 DIAGNOSIS — D709 Neutropenia, unspecified: Secondary | ICD-10-CM | POA: Diagnosis not present

## 2017-08-24 DIAGNOSIS — E785 Hyperlipidemia, unspecified: Secondary | ICD-10-CM | POA: Diagnosis not present

## 2017-08-24 DIAGNOSIS — D472 Monoclonal gammopathy: Secondary | ICD-10-CM | POA: Diagnosis not present

## 2017-08-24 DIAGNOSIS — L84 Corns and callosities: Secondary | ICD-10-CM | POA: Diagnosis not present

## 2017-08-24 DIAGNOSIS — R69 Illness, unspecified: Secondary | ICD-10-CM | POA: Diagnosis not present

## 2017-08-24 DIAGNOSIS — I10 Essential (primary) hypertension: Secondary | ICD-10-CM | POA: Diagnosis not present

## 2017-09-10 ENCOUNTER — Other Ambulatory Visit: Payer: Self-pay | Admitting: Hematology

## 2017-09-10 DIAGNOSIS — D472 Monoclonal gammopathy: Secondary | ICD-10-CM

## 2017-09-10 DIAGNOSIS — D709 Neutropenia, unspecified: Secondary | ICD-10-CM

## 2017-09-10 DIAGNOSIS — C9000 Multiple myeloma not having achieved remission: Secondary | ICD-10-CM

## 2017-09-13 ENCOUNTER — Other Ambulatory Visit: Payer: Self-pay | Admitting: Hematology

## 2017-09-13 DIAGNOSIS — E2839 Other primary ovarian failure: Secondary | ICD-10-CM

## 2017-09-13 DIAGNOSIS — D709 Neutropenia, unspecified: Secondary | ICD-10-CM

## 2017-09-13 DIAGNOSIS — C9000 Multiple myeloma not having achieved remission: Secondary | ICD-10-CM

## 2017-09-13 DIAGNOSIS — D472 Monoclonal gammopathy: Secondary | ICD-10-CM

## 2017-09-14 ENCOUNTER — Inpatient Hospital Stay: Admission: RE | Admit: 2017-09-14 | Payer: Medicare HMO | Source: Ambulatory Visit

## 2017-09-27 ENCOUNTER — Ambulatory Visit
Admission: RE | Admit: 2017-09-27 | Discharge: 2017-09-27 | Disposition: A | Discharge status: Home / Self Care | Payer: Medicare HMO | Source: Ambulatory Visit | Attending: Hematology | Admitting: Hematology

## 2017-09-27 DIAGNOSIS — M8588 Other specified disorders of bone density and structure, other site: Secondary | ICD-10-CM | POA: Diagnosis not present

## 2017-09-27 DIAGNOSIS — E2839 Other primary ovarian failure: Secondary | ICD-10-CM

## 2017-09-27 DIAGNOSIS — D472 Monoclonal gammopathy: Secondary | ICD-10-CM

## 2017-09-27 DIAGNOSIS — C9000 Multiple myeloma not having achieved remission: Secondary | ICD-10-CM

## 2017-09-27 DIAGNOSIS — D709 Neutropenia, unspecified: Secondary | ICD-10-CM

## 2017-09-27 DIAGNOSIS — Z780 Asymptomatic menopausal state: Secondary | ICD-10-CM | POA: Diagnosis not present

## 2017-09-28 DIAGNOSIS — H01134 Eczematous dermatitis of left upper eyelid: Secondary | ICD-10-CM | POA: Diagnosis not present

## 2017-09-28 DIAGNOSIS — H01131 Eczematous dermatitis of right upper eyelid: Secondary | ICD-10-CM | POA: Diagnosis not present

## 2017-09-29 DIAGNOSIS — I10 Essential (primary) hypertension: Secondary | ICD-10-CM | POA: Diagnosis not present

## 2017-09-29 DIAGNOSIS — E785 Hyperlipidemia, unspecified: Secondary | ICD-10-CM | POA: Diagnosis not present

## 2017-09-29 DIAGNOSIS — Z882 Allergy status to sulfonamides status: Secondary | ICD-10-CM | POA: Diagnosis not present

## 2017-09-29 DIAGNOSIS — H409 Unspecified glaucoma: Secondary | ICD-10-CM | POA: Diagnosis not present

## 2017-09-29 DIAGNOSIS — I951 Orthostatic hypotension: Secondary | ICD-10-CM | POA: Diagnosis not present

## 2017-09-29 DIAGNOSIS — Z833 Family history of diabetes mellitus: Secondary | ICD-10-CM | POA: Diagnosis not present

## 2017-09-29 DIAGNOSIS — G4700 Insomnia, unspecified: Secondary | ICD-10-CM | POA: Diagnosis not present

## 2017-10-12 ENCOUNTER — Ambulatory Visit (INDEPENDENT_AMBULATORY_CARE_PROVIDER_SITE_OTHER): Payer: Medicare HMO

## 2017-10-12 ENCOUNTER — Ambulatory Visit: Payer: Medicare HMO | Admitting: Podiatry

## 2017-10-12 ENCOUNTER — Encounter: Payer: Self-pay | Admitting: Podiatry

## 2017-10-12 VITALS — BP 135/71 | HR 79

## 2017-10-12 DIAGNOSIS — B351 Tinea unguium: Secondary | ICD-10-CM | POA: Diagnosis not present

## 2017-10-12 DIAGNOSIS — M21622 Bunionette of left foot: Secondary | ICD-10-CM

## 2017-10-12 DIAGNOSIS — M79674 Pain in right toe(s): Secondary | ICD-10-CM

## 2017-10-12 DIAGNOSIS — M79672 Pain in left foot: Secondary | ICD-10-CM

## 2017-10-12 DIAGNOSIS — L84 Corns and callosities: Secondary | ICD-10-CM

## 2017-10-12 DIAGNOSIS — M79675 Pain in left toe(s): Secondary | ICD-10-CM

## 2017-10-13 NOTE — Progress Notes (Signed)
Subjective:   Patient ID: Tracy Fisher, female   DOB: 72 y.o.   MRN: [id number]   HPI 72 year old female presents the office today for concerns of a callus to the also aspect the left foot as well as for thick, elongated toenails which she cannot trim herself.  Denies any redness or drainage or any swelling from the toenail callus sites.  She has pain on the callus site with pressure. He has no other concerns.   Review of Systems  All other systems reviewed and are negative.  Past Medical History:  Diagnosis Date  . Hypertension     History reviewed. No pertinent surgical history.   Current Outpatient Medications:  .  amLODipine (NORVASC) 5 MG tablet, , Disp: , Rfl:  .  aspirin EC 81 MG tablet, Take 81 mg by mouth daily., Disp: , Rfl:  .  atorvastatin (LIPITOR) 20 MG tablet, , Disp: , Rfl:  .  Calcium Carbonate-Vitamin D (CALCIUM + D PO), Take 1 tablet by mouth daily., Disp: , Rfl:  .  Cholecalciferol 1000 UNITS TBDP, Take 4,000 Units by mouth daily., Disp: , Rfl:  .  dorzolamide-timolol (COSOPT) 22.3-6.8 MG/ML ophthalmic solution, PLACE 1 DROP IN BOTH EYES TWICE A DAY, Disp: , Rfl: 12 .  doxycycline (VIBRA-TABS) 100 MG tablet, , Disp: , Rfl:  .  ketoconazole (NIZORAL) 2 % shampoo, , Disp: , Rfl:  .  latanoprost (XALATAN) 0.005 % ophthalmic solution, Place 1 drop into both eyes at bedtime., Disp: , Rfl:  .  mirtazapine (REMERON) 15 MG tablet, Take 15 mg by mouth at bedtime., Disp: , Rfl:  .  mometasone (ELOCON) 0.1 % ointment, , Disp: , Rfl: 1 .  Multiple Vitamin (MULTIVITAMIN) capsule, Take by mouth., Disp: , Rfl:   Allergies  Allergen Reactions  . Sulfa Antibiotics Itching    Social History   Socioeconomic History  . Marital status: Divorced    Spouse name: Not on file  . Number of children: Not on file  . Years of education: Not on file  . Highest education level: Not on file  Occupational History  . Not on file  Social Needs  . Financial resource strain: Not on file   . Food insecurity:    Worry: Not on file    Inability: Not on file  . Transportation needs:    Medical: Not on file    Non-medical: Not on file  Tobacco Use  . Smoking status: Never Smoker  . Smokeless tobacco: Never Used  Substance and Sexual Activity  . Alcohol use: No  . Drug use: Not on file  . Sexual activity: Not on file  Lifestyle  . Physical activity:    Days per week: Not on file    Minutes per session: Not on file  . Stress: Not on file  Relationships  . Social connections:    Talks on phone: Not on file    Gets together: Not on file    Attends religious service: Not on file    Active member of club or organization: Not on file    Attends meetings of clubs or organizations: Not on file    Relationship status: Not on file  . Intimate partner violence:    Fear of current or ex partner: Not on file    Emotionally abused: Not on file    Physically abused: Not on file    Forced sexual activity: Not on file  Other Topics Concern  . Not on file  Social  History Narrative  . Not on file         Objective:  Physical Exam  General: AAO x3, NAD  Dermatological: Nails are hypertrophic, dystrophic, discolored with ill-defined discoloration.  Also hyperkeratotic lesion left foot submetatarsal 5.  Upon opening there is no underlying ulceration, drainage or any signs of infection.  No open lesions or pre-ulcerative lesions identified otherwise.  Vascular: Dorsalis Pedis artery and Posterior Tibial artery pedal pulses are 2/4 bilateral with immedate capillary fill time.  There is no pain with calf compression, swelling, warmth, erythema.   Neruologic: Grossly intact via light touch bilateral. Protective threshold with Semmes Wienstein monofilament intact to all pedal sites bilateral.   Musculoskeletal: Mild toe swelling is present on the left foot.  Decreased medial arch height.  Muscular strength 5/5 in all groups tested bilateral.  Gait: Unassisted, Nonantalgic.       Assessment:   Symptomatic hyperkeratotic lesions, onychodystrophy/onychomycosis    Plan:  -Treatment options discussed including all alternatives, risks, and complications -Etiology of symptoms were discussed -Hyperkeratotic lesion sharply debrided x1 without any complications or bleeding dispensed offloading pads.  Also consider changing shoes and possibly orthotics to help offload the area.  -Nails debrided 10 without complications or bleeding. -Daily foot inspection -Follow-up in 3 months or sooner if any problems arise. In the meantime, encouraged to call the office with any questions, concerns, change in symptoms.   Celesta Gentile, DPM

## 2017-10-16 DIAGNOSIS — Z0101 Encounter for examination of eyes and vision with abnormal findings: Secondary | ICD-10-CM | POA: Diagnosis not present

## 2017-11-01 ENCOUNTER — Telehealth: Payer: Self-pay | Admitting: Hematology

## 2017-11-01 NOTE — Telephone Encounter (Signed)
Lab/fu [facility] - Moved 5/17 lab/fu to 5/23. Spoke with patient she is aware.

## 2017-11-03 ENCOUNTER — Other Ambulatory Visit: Payer: Self-pay | Admitting: Family Medicine

## 2017-11-03 DIAGNOSIS — Z1231 Encounter for screening mammogram for malignant neoplasm of breast: Secondary | ICD-10-CM

## 2017-11-03 DIAGNOSIS — Z961 Presence of intraocular lens: Secondary | ICD-10-CM | POA: Diagnosis not present

## 2017-11-03 DIAGNOSIS — H401133 Primary open-angle glaucoma, bilateral, severe stage: Secondary | ICD-10-CM | POA: Diagnosis not present

## 2017-11-12 ENCOUNTER — Other Ambulatory Visit: Payer: Medicare HMO

## 2017-11-12 ENCOUNTER — Ambulatory Visit: Payer: Medicare HMO | Admitting: Hematology
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
## 2017-11-18 ENCOUNTER — Encounter: Payer: Self-pay | Admitting: Hematology

## 2017-11-18 ENCOUNTER — Inpatient Hospital Stay (HOSPITAL_BASED_OUTPATIENT_CLINIC_OR_DEPARTMENT_OTHER): Payer: Medicare HMO | Admitting: Hematology

## 2017-11-18 ENCOUNTER — Inpatient Hospital Stay: Payer: Medicare HMO | Attending: Hematology

## 2017-11-18 VITALS — BP 170/95 | HR 76 | Temp 97.6°F | Resp 18 | Ht 64.0 in | Wt 154.1 lb

## 2017-11-18 DIAGNOSIS — D709 Neutropenia, unspecified: Secondary | ICD-10-CM

## 2017-11-18 DIAGNOSIS — D72819 Decreased white blood cell count, unspecified: Secondary | ICD-10-CM | POA: Insufficient documentation

## 2017-11-18 DIAGNOSIS — D472 Monoclonal gammopathy: Secondary | ICD-10-CM

## 2017-11-18 DIAGNOSIS — C9000 Multiple myeloma not having achieved remission: Secondary | ICD-10-CM

## 2017-11-18 LAB — RETICULOCYTES
RBC.: 3.72 MIL/uL (ref 3.70–5.45)
Retic Count, Absolute: 40.9 10*3/uL (ref 33.7–90.7)
Retic Ct Pct: 1.1 % (ref 0.7–2.1)

## 2017-11-18 LAB — CMP (CANCER CENTER ONLY)
ALT: 32 U/L (ref 0–55)
AST: 31 U/L (ref 5–34)
Albumin: 4.1 g/dL (ref 3.5–5.0)
Alkaline Phosphatase: 70 U/L (ref 40–150)
Anion gap: 7 (ref 3–11)
BUN: 11 mg/dL (ref 7–26)
CO2: 25 mmol/L (ref 22–29)
Calcium: 9.5 mg/dL (ref 8.4–10.4)
Chloride: 106 mmol/L (ref 98–109)
Creatinine: 0.79 mg/dL (ref 0.60–1.10)
GFR, Est AFR Am: >60 mL/min (ref >60)
GFR, Estimated: >60 mL/min (ref >60)
Glucose, Bld: 94 mg/dL (ref 70–140)
Potassium: 3.9 mmol/L (ref 3.5–5.1)
Sodium: 138 mmol/L (ref 136–145)
Total Bilirubin: 0.4 mg/dL (ref 0.2–1.2)
Total Protein: 9 g/dL — ABNORMAL HIGH (ref 6.4–8.3)

## 2017-11-18 LAB — CBC WITH DIFFERENTIAL/PLATELET
Basophils Absolute: 0 10*3/uL (ref 0.0–0.1)
Basophils Relative: 1 %
Eosinophils Absolute: 0 10*3/uL (ref 0.0–0.5)
Eosinophils Relative: 1 %
HCT: 36.8 % (ref 34.8–46.6)
Hemoglobin: 12.5 g/dL (ref 11.6–15.9)
Lymphocytes Relative: 54 %
Lymphs Abs: 1.5 10*3/uL (ref 0.9–3.3)
MCH: 33.6 pg (ref 25.1–34.0)
MCHC: 34 g/dL (ref 31.5–36.0)
MCV: 98.9 fL (ref 79.5–101.0)
Monocytes Absolute: 0.2 10*3/uL (ref 0.1–0.9)
Monocytes Relative: 9 %
Neutro Abs: 0.9 10*3/uL — ABNORMAL LOW (ref 1.5–6.5)
Neutrophils Relative %: 35 %
Platelets: 187 10*3/uL (ref 145–400)
RBC: 3.72 MIL/uL (ref 3.70–5.45)
RDW: 14.3 % (ref 11.2–14.5)
WBC: 2.7 10*3/uL — ABNORMAL LOW (ref 3.9–10.3)

## 2017-11-19 LAB — KAPPA/LAMBDA LIGHT CHAINS
Kappa free light chain: 20 mg/L — ABNORMAL HIGH (ref 3.3–19.4)
Kappa, lambda light chain ratio: 2.44 — ABNORMAL HIGH (ref 0.26–1.65)
Lambda free light chains: 8.2 mg/L (ref 5.7–26.3)

## 2017-11-24 DIAGNOSIS — H401133 Primary open-angle glaucoma, bilateral, severe stage: Secondary | ICD-10-CM | POA: Diagnosis not present

## 2017-11-24 DIAGNOSIS — Z961 Presence of intraocular lens: Secondary | ICD-10-CM | POA: Diagnosis not present

## 2017-11-24 LAB — MULTIPLE MYELOMA PANEL, SERUM
Albumin SerPl Elph-Mcnc: 4.2 g/dL (ref 2.9–4.4)
Albumin/Glob SerPl: 1 (ref 0.7–1.7)
Alpha 1: 0.2 g/dL (ref 0.0–0.4)
Alpha2 Glob SerPl Elph-Mcnc: 0.7 g/dL (ref 0.4–1.0)
B-Globulin SerPl Elph-Mcnc: 0.9 g/dL (ref 0.7–1.3)
Gamma Glob SerPl Elph-Mcnc: 2.6 g/dL — ABNORMAL HIGH (ref 0.4–1.8)
Globulin, Total: 4.3 g/dL — ABNORMAL HIGH (ref 2.2–3.9)
IgA: 21 mg/dL — ABNORMAL LOW (ref 64–422)
IgG (Immunoglobin G), Serum: 2841 mg/dL — ABNORMAL HIGH (ref 700–1600)
IgM (Immunoglobulin M), Srm: 45 mg/dL (ref 26–217)
M Protein SerPl Elph-Mcnc: 2.4 g/dL — ABNORMAL HIGH
Total Protein ELP: 8.5 g/dL (ref 6.0–8.5)

## 2017-11-25 DIAGNOSIS — J069 Acute upper respiratory infection, unspecified: Secondary | ICD-10-CM | POA: Diagnosis not present

## 2017-12-13 ENCOUNTER — Ambulatory Visit
Admission: RE | Admit: 2017-12-13 | Discharge: 2017-12-13 | Disposition: A | Discharge status: Home / Self Care | Payer: Medicare HMO | Source: Ambulatory Visit | Attending: Family Medicine | Admitting: Family Medicine

## 2017-12-13 DIAGNOSIS — W57XXXA Bitten or stung by nonvenomous insect and other nonvenomous arthropods, initial encounter: Secondary | ICD-10-CM | POA: Diagnosis not present

## 2017-12-13 DIAGNOSIS — J069 Acute upper respiratory infection, unspecified: Secondary | ICD-10-CM | POA: Diagnosis not present

## 2017-12-13 DIAGNOSIS — S0096XA Insect bite (nonvenomous) of unspecified part of head, initial encounter: Secondary | ICD-10-CM | POA: Diagnosis not present

## 2017-12-13 DIAGNOSIS — Z1231 Encounter for screening mammogram for malignant neoplasm of breast: Secondary | ICD-10-CM | POA: Diagnosis not present

## 2017-12-22 DIAGNOSIS — E785 Hyperlipidemia, unspecified: Secondary | ICD-10-CM | POA: Diagnosis not present

## 2017-12-22 DIAGNOSIS — D472 Monoclonal gammopathy: Secondary | ICD-10-CM | POA: Diagnosis not present

## 2017-12-22 DIAGNOSIS — R7309 Other abnormal glucose: Secondary | ICD-10-CM | POA: Diagnosis not present

## 2017-12-22 DIAGNOSIS — E559 Vitamin D deficiency, unspecified: Secondary | ICD-10-CM | POA: Diagnosis not present

## 2017-12-22 DIAGNOSIS — D709 Neutropenia, unspecified: Secondary | ICD-10-CM | POA: Diagnosis not present

## 2017-12-22 DIAGNOSIS — R69 Illness, unspecified: Secondary | ICD-10-CM | POA: Diagnosis not present

## 2017-12-22 DIAGNOSIS — I10 Essential (primary) hypertension: Secondary | ICD-10-CM | POA: Diagnosis not present

## 2018-01-25 DIAGNOSIS — H401133 Primary open-angle glaucoma, bilateral, severe stage: Secondary | ICD-10-CM | POA: Diagnosis not present

## 2018-02-11 ENCOUNTER — Encounter: Payer: Self-pay | Admitting: Podiatry

## 2018-02-11 ENCOUNTER — Ambulatory Visit: Payer: Medicare HMO | Admitting: Podiatry

## 2018-02-11 DIAGNOSIS — B351 Tinea unguium: Secondary | ICD-10-CM | POA: Diagnosis not present

## 2018-02-11 DIAGNOSIS — M79674 Pain in right toe(s): Secondary | ICD-10-CM | POA: Diagnosis not present

## 2018-02-11 DIAGNOSIS — Z9229 Personal history of other drug therapy: Secondary | ICD-10-CM | POA: Diagnosis not present

## 2018-02-11 DIAGNOSIS — L84 Corns and callosities: Secondary | ICD-10-CM | POA: Diagnosis not present

## 2018-02-11 DIAGNOSIS — M79675 Pain in left toe(s): Secondary | ICD-10-CM | POA: Diagnosis not present

## 2018-02-14 NOTE — Progress Notes (Signed)
Subjective: Tracy Fisher is a 72 y.o. y.o. female who presents today with cc of painful, discolored, thick toenails and painful callus left foot  which interfere with daily activities. Pain is aggravated when wearing enclosed shoe gear. Pain is relieved with periodic professional debridement.  Objective: Vascular Examination: Capillary refill time immediate x 10 digits Dorsalis pedis pulses and Posterior tibial pulses present b/l No digital hair x 10 digits Skin temperature WNL b/l  Dermatological Examination: Turgor, texture and tone normal b/l LE Toenails 1-5 b/l discolored, thick, dystrophic with subungual debris and pain with palpation to nailbeds due to thickness of nails. Hyperkeratotic lesion noted left foot submet head 5 with tenderness to palpation. No erythema, no edema, no flocculence.  Musculoskeletal: Muscle strength 5/5 to all LE muscle groups  Neurological: Sensation intact with 10 gram monofilament. Vibratory sensation intact.  Assessment: Painful onychomycosis toenails 1-5 b/l in patient on blood thinner.  Painful callus submet head 5 left foot  Plan: 1. Toenails 1-5 b/l were debrided in length and girth without iatrogenic bleeding. 2. Callus pared with chisel blade submet 5 left foot 3. Patient to continue soft, supportive shoe gear 4. Patient to report any pedal injuries to medical professional immediately. 5. Avoid self trimming due to use of blood thinner. 6. Follow up 3 months.  7. Patient to call should there be a concern in the interim.

## 2018-03-04 ENCOUNTER — Other Ambulatory Visit: Payer: Self-pay

## 2018-03-04 DIAGNOSIS — C9000 Multiple myeloma not having achieved remission: Secondary | ICD-10-CM

## 2018-03-04 DIAGNOSIS — D472 Monoclonal gammopathy: Secondary | ICD-10-CM
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
## 2018-03-07 ENCOUNTER — Ambulatory Visit (HOSPITAL_COMMUNITY)
Admission: RE | Admit: 2018-03-07 | Discharge: 2018-03-07 | Disposition: A | Discharge status: Home / Self Care | Payer: Medicare HMO | Source: Ambulatory Visit | Attending: Hematology | Admitting: Hematology

## 2018-03-07 ENCOUNTER — Inpatient Hospital Stay: Payer: Medicare HMO

## 2018-03-07 ENCOUNTER — Inpatient Hospital Stay: Payer: Medicare HMO | Attending: Hematology | Admitting: Hematology

## 2018-03-07 ENCOUNTER — Telehealth: Payer: Self-pay

## 2018-03-07 VITALS — BP 171/85 | HR 80 | Temp 98.3°F | Resp 18 | Ht 64.0 in | Wt 153.5 lb

## 2018-03-07 DIAGNOSIS — M19012 Primary osteoarthritis, left shoulder: Secondary | ICD-10-CM | POA: Diagnosis not present

## 2018-03-07 DIAGNOSIS — D472 Monoclonal gammopathy: Secondary | ICD-10-CM

## 2018-03-07 DIAGNOSIS — M25511 Pain in right shoulder: Secondary | ICD-10-CM

## 2018-03-07 DIAGNOSIS — M25512 Pain in left shoulder: Secondary | ICD-10-CM | POA: Insufficient documentation

## 2018-03-07 DIAGNOSIS — C9000 Multiple myeloma not having achieved remission: Secondary | ICD-10-CM

## 2018-03-07 DIAGNOSIS — M79622 Pain in left upper arm: Secondary | ICD-10-CM | POA: Diagnosis not present

## 2018-03-07 DIAGNOSIS — M19011 Primary osteoarthritis, right shoulder: Secondary | ICD-10-CM | POA: Insufficient documentation

## 2018-03-07 DIAGNOSIS — D709 Neutropenia, unspecified: Secondary | ICD-10-CM | POA: Diagnosis not present

## 2018-03-07 LAB — CBC WITH DIFFERENTIAL (CANCER CENTER ONLY)
Basophils Absolute: 0 10*3/uL (ref 0.0–0.1)
Basophils Relative: 1 %
Eosinophils Absolute: 0 10*3/uL (ref 0.0–0.5)
Eosinophils Relative: 1 %
HCT: 37.7 % (ref 34.8–46.6)
Hemoglobin: 12.7 g/dL (ref 11.6–15.9)
Lymphocytes Relative: 51 %
Lymphs Abs: 1.5 10*3/uL (ref 0.9–3.3)
MCH: 33.3 pg (ref 25.1–34.0)
MCHC: 33.7 g/dL (ref 31.5–36.0)
MCV: 99 fL (ref 79.5–101.0)
Monocytes Absolute: 0.3 10*3/uL (ref 0.1–0.9)
Monocytes Relative: 9 %
Neutro Abs: 1.1 10*3/uL — ABNORMAL LOW (ref 1.5–6.5)
Neutrophils Relative %: 38 %
Platelet Count: 181 10*3/uL (ref 145–400)
RBC: 3.81 MIL/uL (ref 3.70–5.45)
RDW: 14.5 % (ref 11.2–14.5)
WBC Count: 2.9 10*3/uL — ABNORMAL LOW (ref 3.9–10.3)

## 2018-03-07 LAB — RETICULOCYTES
RBC.: 3.81 MIL/uL (ref 3.70–5.45)
Retic Count, Absolute: 41.9 10*3/uL (ref 33.7–90.7)
Retic Ct Pct: 1.1 % (ref 0.7–2.1)

## 2018-03-07 LAB — CMP (CANCER CENTER ONLY)
ALT: 24 U/L (ref 0–44)
AST: 23 U/L (ref 15–41)
Albumin: 4 g/dL (ref 3.5–5.0)
Alkaline Phosphatase: 70 U/L (ref 38–126)
Anion gap: 6 (ref 5–15)
BUN: 13 mg/dL (ref 8–23)
CO2: 26 mmol/L (ref 22–32)
Calcium: 9.9 mg/dL (ref 8.9–10.3)
Chloride: 108 mmol/L (ref 98–111)
Creatinine: 0.84 mg/dL (ref 0.44–1.00)
GFR, Est AFR Am: >60 mL/min (ref >60)
GFR, Estimated: >60 mL/min (ref >60)
Glucose, Bld: 136 mg/dL — ABNORMAL HIGH (ref 70–99)
Potassium: 4.2 mmol/L (ref 3.5–5.1)
Sodium: 140 mmol/L (ref 135–145)
Total Bilirubin: 0.4 mg/dL (ref 0.3–1.2)
Total Protein: 9.3 g/dL — ABNORMAL HIGH (ref 6.5–8.1)

## 2018-03-07 NOTE — Telephone Encounter (Signed)
Printed avs and calender of upcoming appointment. Per 9/9 los 

## 2018-03-08 LAB — MULTIPLE MYELOMA PANEL, SERUM
Albumin SerPl Elph-Mcnc: 4 g/dL (ref 2.9–4.4)
Albumin/Glob SerPl: 0.9 (ref 0.7–1.7)
Alpha 1: 0.3 g/dL (ref 0.0–0.4)
Alpha2 Glob SerPl Elph-Mcnc: 0.7 g/dL (ref 0.4–1.0)
B-Globulin SerPl Elph-Mcnc: 0.9 g/dL (ref 0.7–1.3)
Gamma Glob SerPl Elph-Mcnc: 2.6 g/dL — ABNORMAL HIGH (ref 0.4–1.8)
Globulin, Total: 4.5 g/dL — ABNORMAL HIGH (ref 2.2–3.9)
IgA: 19 mg/dL — ABNORMAL LOW (ref 64–422)
IgG (Immunoglobin G), Serum: 2958 mg/dL — ABNORMAL HIGH (ref 700–1600)
IgM (Immunoglobulin M), Srm: 37 mg/dL (ref 26–217)
M Protein SerPl Elph-Mcnc: 2.3 g/dL — ABNORMAL HIGH
Total Protein ELP: 8.5 g/dL (ref 6.0–8.5)

## 2018-03-08 LAB — KAPPA/LAMBDA LIGHT CHAINS
Kappa free light chain: 20.4 mg/L — ABNORMAL HIGH (ref 3.3–19.4)
Kappa, lambda light chain ratio: 3.29 — ABNORMAL HIGH (ref 0.26–1.65)
Lambda free light chains: 6.2 mg/L (ref 5.7–26.3)

## 2018-03-16 ENCOUNTER — Telehealth: Payer: Self-pay

## 2018-03-16 NOTE — Telephone Encounter (Signed)
-----   Message from Brunetta Genera, MD sent at 03/16/2018 12:07 AM EDT ----- Labs stable. No significant change in blood counts or M spike. No evidence of overt progression to myeloma. Continue f/u as planned for smoldering myeloma. thx GK  ----- Message ----- From: Tami Lin, RN Sent: 03/15/2018   1:42 PM EDT To: Brunetta Genera, MD  Patient called and wants to know if you will review her lab work from 03/07/18 and let her know if any changes or anything new has occurred since she does not come back to see you until January.  Lanelle Bal

## 2018-03-16 NOTE — Telephone Encounter (Signed)
Call placed to patient and informed her of Dr. Grier Mitts message regarding her lab results. Patient verbalized understanding.

## 2018-03-19 DIAGNOSIS — R69 Illness, unspecified: Secondary | ICD-10-CM | POA: Diagnosis not present

## 2018-03-24 DIAGNOSIS — M25512 Pain in left shoulder: Secondary | ICD-10-CM | POA: Diagnosis not present

## 2018-03-24 DIAGNOSIS — S61412A Laceration without foreign body of left hand, initial encounter: Secondary | ICD-10-CM | POA: Diagnosis not present

## 2018-03-24 DIAGNOSIS — R69 Illness, unspecified: Secondary | ICD-10-CM | POA: Diagnosis not present

## 2018-05-09 DIAGNOSIS — R7303 Prediabetes: Secondary | ICD-10-CM | POA: Diagnosis not present

## 2018-05-09 DIAGNOSIS — Z0000 Encounter for general adult medical examination without abnormal findings: Secondary | ICD-10-CM | POA: Diagnosis not present

## 2018-05-09 DIAGNOSIS — I10 Essential (primary) hypertension: Secondary | ICD-10-CM | POA: Diagnosis not present

## 2018-05-09 DIAGNOSIS — M25512 Pain in left shoulder: Secondary | ICD-10-CM | POA: Diagnosis not present

## 2018-05-09 DIAGNOSIS — M8588 Other specified disorders of bone density and structure, other site: Secondary | ICD-10-CM | POA: Diagnosis not present

## 2018-05-09 DIAGNOSIS — C9000 Multiple myeloma not having achieved remission: Secondary | ICD-10-CM | POA: Diagnosis not present

## 2018-05-09 DIAGNOSIS — R69 Illness, unspecified: Secondary | ICD-10-CM | POA: Diagnosis not present

## 2018-05-09 DIAGNOSIS — E559 Vitamin D deficiency, unspecified: Secondary | ICD-10-CM | POA: Diagnosis not present

## 2018-05-09 DIAGNOSIS — D709 Neutropenia, unspecified: Secondary | ICD-10-CM | POA: Diagnosis not present

## 2018-05-09 DIAGNOSIS — E785 Hyperlipidemia, unspecified: Secondary | ICD-10-CM | POA: Diagnosis not present

## 2018-05-10 DIAGNOSIS — M25512 Pain in left shoulder: Secondary | ICD-10-CM | POA: Diagnosis not present

## 2018-05-10 DIAGNOSIS — M65341 Trigger finger, right ring finger: Secondary | ICD-10-CM | POA: Diagnosis not present

## 2018-05-10 DIAGNOSIS — M67912 Unspecified disorder of synovium and tendon, left shoulder: Secondary | ICD-10-CM | POA: Diagnosis not present

## 2018-05-13 ENCOUNTER — Encounter: Payer: Self-pay | Admitting: Podiatry

## 2018-05-13 ENCOUNTER — Ambulatory Visit: Payer: Medicare HMO | Admitting: Podiatry

## 2018-05-13 DIAGNOSIS — B351 Tinea unguium: Secondary | ICD-10-CM | POA: Diagnosis not present

## 2018-05-13 DIAGNOSIS — M79674 Pain in right toe(s): Secondary | ICD-10-CM

## 2018-05-13 DIAGNOSIS — L84 Corns and callosities: Secondary | ICD-10-CM

## 2018-05-13 DIAGNOSIS — M79675 Pain in left toe(s): Secondary | ICD-10-CM | POA: Diagnosis not present

## 2018-05-18 ENCOUNTER — Telehealth: Payer: Self-pay | Admitting: *Deleted

## 2018-05-18 NOTE — Telephone Encounter (Signed)
Notified 05/16/18 by Dr. Orest Dikes office [phone] Sadie Haber MDs) that Prolia was denied by insurance.

## 2018-05-30 ENCOUNTER — Encounter: Payer: Self-pay | Admitting: Podiatry

## 2018-05-30 NOTE — Progress Notes (Signed)
Subjective: Tracy Fisher presents today with cc of painful, discolored, thick toenails and painful callus left foot which interfere with daily activities and routine tasks. Pain is aggravated when wearing enclosed shoe gear. Pain is getting progressively worse. Patient is seeking professional help regarding symptoms.  Objective: Vascular Examination: Capillary refill time immediate x 10 digits Dorsalis pedis and posterior tibial pulses present b/l No digital hair x 10 digits Skin temperature WNL b/l  Dermatological Examination: Skin with normal turgor, texture and tone  Toenails 1-5 b/l elongated, discolored, thickened and dystrophic with subungual debris with pain on palpation due to thickness of nails  Hyperkeratotic lesion submet head 5 left foot with tenderness to palpation. No erythema, no edema, no drainage, no flocculence.  Musculoskeletal: Muscle strength 5/5 to all LE muscle groups  Neurological: Sensation intact with 10 gram monofilament. Vibratory sensation intact.  Assessment: 1. Painful onychomycosis 1-5 b/l 2. Painful callus submet head 5 left foot  Plan: 1. Patient to continue soft, supportive shoe gear 2. Toenails 1-5 b/l were debrided in length and girth  3. Callus debrided submet head 5 left foot 4. Patient to report any pedal injuries to medical professional immediately. 5. Follow up 3 months. Patient/POA to call should there be a concern in the interim.

## 2018-06-04 ENCOUNTER — Emergency Department (HOSPITAL_COMMUNITY)
Admission: EM | Admit: 2018-06-04 | Discharge: 2018-06-04 | Disposition: A | Discharge status: Home / Self Care | Payer: Medicare HMO | Attending: Emergency Medicine | Admitting: Emergency Medicine

## 2018-06-04 ENCOUNTER — Encounter (HOSPITAL_COMMUNITY): Payer: Self-pay | Admitting: Emergency Medicine

## 2018-06-04 ENCOUNTER — Other Ambulatory Visit: Payer: Self-pay

## 2018-06-04 DIAGNOSIS — K529 Noninfective gastroenteritis and colitis, unspecified: Secondary | ICD-10-CM | POA: Diagnosis not present

## 2018-06-04 DIAGNOSIS — K5289 Other specified noninfective gastroenteritis and colitis: Secondary | ICD-10-CM | POA: Diagnosis not present

## 2018-06-04 DIAGNOSIS — Z79899 Other long term (current) drug therapy: Secondary | ICD-10-CM | POA: Insufficient documentation

## 2018-06-04 DIAGNOSIS — R1110 Vomiting, unspecified: Secondary | ICD-10-CM | POA: Diagnosis present

## 2018-06-04 DIAGNOSIS — Z7982 Long term (current) use of aspirin: Secondary | ICD-10-CM | POA: Insufficient documentation

## 2018-06-04 DIAGNOSIS — I10 Essential (primary) hypertension: Secondary | ICD-10-CM | POA: Insufficient documentation

## 2018-06-04 LAB — COMPREHENSIVE METABOLIC PANEL
ALT: 36 U/L (ref 0–44)
AST: 35 U/L (ref 15–41)
Albumin: 4.3 g/dL (ref 3.5–5.0)
Alkaline Phosphatase: 61 U/L (ref 38–126)
Anion gap: 10 (ref 5–15)
BUN: 26 mg/dL — ABNORMAL HIGH (ref 8–23)
CO2: 24 mmol/L (ref 22–32)
Calcium: 9.5 mg/dL (ref 8.9–10.3)
Chloride: 107 mmol/L (ref 98–111)
Creatinine, Ser: 0.95 mg/dL (ref 0.44–1.00)
GFR calc Af Amer: >60 mL/min (ref >60)
GFR calc non Af Amer: 60 mL/min — ABNORMAL LOW (ref >60)
Glucose, Bld: 113 mg/dL — ABNORMAL HIGH (ref 70–99)
Potassium: 4 mmol/L (ref 3.5–5.1)
Sodium: 141 mmol/L (ref 135–145)
Total Bilirubin: 0.8 mg/dL (ref 0.3–1.2)
Total Protein: 9.3 g/dL — ABNORMAL HIGH (ref 6.5–8.1)

## 2018-06-04 LAB — URINALYSIS, ROUTINE W REFLEX MICROSCOPIC
Bacteria, UA: NONE SEEN
Bilirubin Urine: NEGATIVE
Glucose, UA: NEGATIVE mg/dL
Hgb urine dipstick: NEGATIVE
Ketones, ur: NEGATIVE mg/dL
Nitrite: NEGATIVE
Protein, ur: NEGATIVE mg/dL
Specific Gravity, Urine: 1.024 (ref 1.005–1.030)
pH: 5 (ref 5.0–8.0)

## 2018-06-04 LAB — CBC
HCT: 39.9 % (ref 36.0–46.0)
Hemoglobin: 13.2 g/dL (ref 12.0–15.0)
MCH: 33.6 pg (ref 26.0–34.0)
MCHC: 33.1 g/dL (ref 30.0–36.0)
MCV: 101.5 fL — ABNORMAL HIGH (ref 80.0–100.0)
Platelets: 193 10*3/uL (ref 150–400)
RBC: 3.93 MIL/uL (ref 3.87–5.11)
RDW: 14.5 % (ref 11.5–15.5)
WBC: 4.7 10*3/uL (ref 4.0–10.5)
nRBC: 0 % (ref 0.0–0.2)

## 2018-06-04 LAB — LIPASE, BLOOD: Lipase: 35 U/L (ref 11–51)

## 2018-06-04 MED ORDER — METOCLOPRAMIDE HCL 10 MG PO TABS: 10.0000 mg | ORAL_TABLET | Freq: Three times a day (TID) | ORAL | 0 refills | Status: DC | PRN

## 2018-06-04 NOTE — Discharge Instructions (Addendum)
Take Tylenol as needed for abdominal pain.  Take the medication prescribed as needed for nausea.  Can also take Imodium as directed for diarrhea.  Avoid milk or foods containing milk such as cheese or ice cream while having diarrhea.Make sure that you drink at least six 8 ounce glasses of water or Gatorade each day in order to stay well-hydrated.  Call Dr. Dema Severin to be seen in the office if not feeling better by next week.  Return here if you continue to vomit after taking the medicine prescribed or if you feel worse for any reason

## 2018-06-04 NOTE — ED Notes (Signed)
ED Provider at bedside. 

## 2018-06-04 NOTE — ED Provider Notes (Signed)
[facility] DEPT Provider Note   CSN: [id number] Arrival date & time: 06/04/18  8101     History   Chief Complaint No chief complaint on file. Chief complaint vomiting and diarrhea  HPI Tracy Fisher is a 72 y.o. female.  HPI patient has had 3 episodes of vomiting and 3 episodes of diarrhea since last night.  Also complains of nasal congestion since last night. she states her abdomen feels minimally sore.  Abdominal discomfort made worse with vomiting improved with remaining still presently mild.  She is not presently nauseated she feels hungry at present states she denies any urinary symptoms.  Nuys fever denies other associated symptoms.  No blood per rectum no hematemesis denies lightheadedness  Past Medical History:  Diagnosis Date  . Hypertension     Patient Active Problem List   Diagnosis Date Noted  . Dysphonia 02/11/2015    History reviewed. No pertinent surgical history.   OB History   None      Home Medications    Prior to Admission medications   Medication Sig Start Date End Date Taking? Authorizing Provider  amLODipine (NORVASC) 5 MG tablet  02/01/15   [provider]  aspirin EC 81 MG tablet Take 81 mg by mouth daily.    [provider]  atorvastatin (LIPITOR) 20 MG tablet  11/25/16   [provider]  brimonidine (ALPHAGAN) 0.2 % ophthalmic solution INSTILL 1 DROP INTO EACH EYE TWICE DAILY 01/30/18   [provider]  Calcium Carbonate-Vitamin D (CALCIUM + D PO) Take 1 tablet by mouth daily.    [provider]  Cholecalciferol 1000 UNITS TBDP Take 4,000 Units by mouth daily.    [provider]  dorzolamide-timolol (COSOPT) 22.3-6.8 MG/ML ophthalmic solution PLACE 1 DROP IN BOTH EYES TWICE A DAY 11/21/16   [provider]  doxycycline (VIBRA-TABS) 100 MG tablet  11/25/16   [provider]  ketoconazole (NIZORAL) 2 % shampoo  08/08/17   [provider]    latanoprost (XALATAN) 0.005 % ophthalmic solution Place 1 drop into both eyes at bedtime.    [provider]  mirtazapine (REMERON) 15 MG tablet Take 15 mg by mouth at bedtime.    [provider]  mometasone (ELOCON) 0.1 % ointment  09/28/17   [provider]  Multiple Vitamin (MULTIVITAMIN) capsule Take by mouth.    [provider]  naproxen (NAPROSYN) 500 MG tablet TAKE 1 TABLET BY MOUTH EVERY 12 HOURS WITH FOOD FOR 15 DAYS 03/24/18   [provider]    Family History Family History  Problem Relation Age of Onset  . Breast cancer Neg Hx     Social History Social History   Tobacco Use  . Smoking status: Never Smoker  . Smokeless tobacco: Never Used  Substance Use Topics  . Alcohol use: No  . Drug use: Not on file   No illicit drug use  Allergies   Sulfa antibiotics   Review of Systems Review of Systems  Constitutional: Negative.   HENT: Positive for congestion.   Respiratory: Negative.   Cardiovascular: Negative.   Gastrointestinal: Positive for abdominal pain, diarrhea and vomiting.  Musculoskeletal: Negative.   Skin: Negative.   Neurological: Negative.   Psychiatric/Behavioral: Negative.   All other systems reviewed and are negative.    Physical Exam Updated Vital Signs BP (!) 137/99   Pulse 87   Temp 98.2 F (36.8 C) (Oral)   Resp 16   SpO2 96%  Physical Exam  Constitutional: She is oriented to person, place, and time. She appears well-developed and well-nourished.  HENT:  Head: Normocephalic and atraumatic.  Eyes: Pupils are equal, round, and reactive to light. Conjunctivae are normal.  Neck: Neck supple. No tracheal deviation present. No thyromegaly present.  Cardiovascular: Normal rate and regular rhythm.  No murmur heard. Pulmonary/Chest: Effort normal and breath sounds normal.  Abdominal: Soft. Bowel sounds are normal. She exhibits no distension. There is no tenderness.  Musculoskeletal: Normal range  of motion. She exhibits no edema or tenderness.  Neurological: She is alert and oriented to person, place, and time. Coordination normal.  Gait normal not lightheaded on standing  Skin: Skin is warm and dry. No rash noted.  Psychiatric: She has a normal mood and affect.  Nursing note and vitals reviewed.    ED Treatments / Results  Labs (all labs ordered are listed, but only abnormal results are displayed) Labs Reviewed  CBC - Abnormal; Notable for the following components:      Result Value   MCV 101.5 (*)    All other components within normal limits  LIPASE, BLOOD  COMPREHENSIVE METABOLIC PANEL  URINALYSIS, ROUTINE W REFLEX MICROSCOPIC    EKG None  Radiology No results found.  Procedures Procedures (including critical care time)  Medications Ordered in ED Medications - No data to display  8:50 AM patient feels well.  Feels ready to go home.  She is not nauseated.  She drank ginger ale without vomiting.  Is presently hungry. Results for orders placed or performed during the hospital encounter of 06/04/18  Lipase, blood  Result Value Ref Range   Lipase 35 11 - 51 U/L  Comprehensive metabolic panel  Result Value Ref Range   Sodium 141 135 - 145 mmol/L   Potassium 4.0 3.5 - 5.1 mmol/L   Chloride 107 98 - 111 mmol/L   CO2 24 22 - 32 mmol/L   Glucose, Bld 113 (H) 70 - 99 mg/dL   BUN 26 (H) 8 - 23 mg/dL   Creatinine, Ser 0.95 0.44 - 1.00 mg/dL   Calcium 9.5 8.9 - 10.3 mg/dL   Total Protein 9.3 (H) 6.5 - 8.1 g/dL   Albumin 4.3 3.5 - 5.0 g/dL   AST 35 15 - 41 U/L   ALT 36 0 - 44 U/L   Alkaline Phosphatase 61 38 - 126 U/L   Total Bilirubin 0.8 0.3 - 1.2 mg/dL   GFR calc non Af Amer 60 (L) >60 mL/min   GFR calc Af Amer >60 >60 mL/min   Anion gap 10 5 - 15  CBC  Result Value Ref Range   WBC 4.7 4.0 - 10.5 K/uL   RBC 3.93 3.87 - 5.11 MIL/uL   Hemoglobin 13.2 12.0 - 15.0 g/dL   HCT 39.9 36.0 - 46.0 %   MCV 101.5 (H) 80.0 - 100.0 fL   MCH 33.6 26.0 - 34.0 pg    MCHC 33.1 30.0 - 36.0 g/dL   RDW 14.5 11.5 - 15.5 %   Platelets 193 150 - 400 K/uL   nRBC 0.0 0.0 - 0.2 %  Urinalysis, Routine w reflex microscopic  Result Value Ref Range   Color, Urine YELLOW YELLOW   APPearance CLEAR CLEAR   Specific Gravity, Urine 1.024 1.005 - 1.030   pH 5.0 5.0 - 8.0   Glucose, UA NEGATIVE NEGATIVE mg/dL   Hgb urine dipstick NEGATIVE NEGATIVE   Bilirubin Urine NEGATIVE NEGATIVE   Ketones, ur NEGATIVE NEGATIVE mg/dL  Protein, ur NEGATIVE NEGATIVE mg/dL   Nitrite NEGATIVE NEGATIVE   Leukocytes, UA TRACE (A) NEGATIVE   WBC, UA 0-5 0 - 5 WBC/hpf   Bacteria, UA NONE SEEN NONE SEEN   Squamous Epithelial / LPF 0-5 0 - 5   Mucus PRESENT    Hyaline Casts, UA PRESENT    No results found. Initial Impression / Assessment and Plan / ED Course  I have reviewed the triage vital signs and the nursing notes.  Pertinent labs & imaging results that were available during my care of the patient were reviewed by me and considered in my medical decision making (see chart for details).     Lab work unremarkable.  No clinical signs of dehydration .symptoms consistent with viral illness with nasal congestion, vomiting and diarrhea. Plan prescription Reglan.  Imodium for diarrhea.  Avoid dairy.  Encourage oral hydration.  Follow-up with PMD if not better by next week Final Clinical Impressions(s) / ED Diagnoses  Diagnosis gastroenteritis Final diagnoses:  None    ED Discharge Orders    None       Orlie Dakin, MD 06/04/18 [phone]

## 2018-06-04 NOTE — ED Triage Notes (Signed)
Pt from home with c/o emesis and diarrhea that began this morning. Pt states she had ice cream before bed but she does that every night. Pt is not febrile or tachycardic at time of assessment.

## 2018-06-06 DIAGNOSIS — K529 Noninfective gastroenteritis and colitis, unspecified: Secondary | ICD-10-CM | POA: Diagnosis not present

## 2018-06-27 ENCOUNTER — Inpatient Hospital Stay: Payer: Medicare HMO | Attending: Hematology

## 2018-06-27 DIAGNOSIS — D472 Monoclonal gammopathy: Secondary | ICD-10-CM

## 2018-06-27 DIAGNOSIS — M25512 Pain in left shoulder: Secondary | ICD-10-CM

## 2018-06-27 DIAGNOSIS — M25511 Pain in right shoulder: Secondary | ICD-10-CM

## 2018-06-27 DIAGNOSIS — C9000 Multiple myeloma not having achieved remission: Secondary | ICD-10-CM | POA: Insufficient documentation

## 2018-06-27 LAB — CBC WITH DIFFERENTIAL/PLATELET
Abs Immature Granulocytes: 0 10*3/uL (ref 0.00–0.07)
Basophils Absolute: 0 10*3/uL (ref 0.0–0.1)
Basophils Relative: 1 %
Eosinophils Absolute: 0 10*3/uL (ref 0.0–0.5)
Eosinophils Relative: 2 %
HCT: 36.4 % (ref 36.0–46.0)
Hemoglobin: 12 g/dL (ref 12.0–15.0)
Immature Granulocytes: 0 %
Lymphocytes Relative: 45 %
Lymphs Abs: 1.2 10*3/uL (ref 0.7–4.0)
MCH: 33.2 pg (ref 26.0–34.0)
MCHC: 33 g/dL (ref 30.0–36.0)
MCV: 100.8 fL — ABNORMAL HIGH (ref 80.0–100.0)
Monocytes Absolute: 0.3 10*3/uL (ref 0.1–1.0)
Monocytes Relative: 12 %
Neutro Abs: 1 10*3/uL — ABNORMAL LOW (ref 1.7–7.7)
Neutrophils Relative %: 40 %
Platelets: 182 10*3/uL (ref 150–400)
RBC: 3.61 MIL/uL — ABNORMAL LOW (ref 3.87–5.11)
RDW: 14.4 % (ref 11.5–15.5)
WBC: 2.6 10*3/uL — ABNORMAL LOW (ref 4.0–10.5)
nRBC: 0 % (ref 0.0–0.2)

## 2018-06-27 LAB — CMP (CANCER CENTER ONLY)
ALT: 39 U/L (ref 0–44)
AST: 31 U/L (ref 15–41)
Albumin: 3.8 g/dL (ref 3.5–5.0)
Alkaline Phosphatase: 64 U/L (ref 38–126)
Anion gap: 7 (ref 5–15)
BUN: 19 mg/dL (ref 8–23)
CO2: 26 mmol/L (ref 22–32)
Calcium: 9.5 mg/dL (ref 8.9–10.3)
Chloride: 108 mmol/L (ref 98–111)
Creatinine: 0.85 mg/dL (ref 0.44–1.00)
GFR, Est AFR Am: >60 mL/min (ref >60)
GFR, Estimated: >60 mL/min (ref >60)
Glucose, Bld: 103 mg/dL — ABNORMAL HIGH (ref 70–99)
Potassium: 4 mmol/L (ref 3.5–5.1)
Sodium: 141 mmol/L (ref 135–145)
Total Bilirubin: 0.4 mg/dL (ref 0.3–1.2)
Total Protein: 9 g/dL — ABNORMAL HIGH (ref 6.5–8.1)

## 2018-06-28 LAB — MULTIPLE MYELOMA PANEL, SERUM
Albumin SerPl Elph-Mcnc: 4 g/dL (ref 2.9–4.4)
Albumin/Glob SerPl: 0.9 (ref 0.7–1.7)
Alpha 1: 0.3 g/dL (ref 0.0–0.4)
Alpha2 Glob SerPl Elph-Mcnc: 0.8 g/dL (ref 0.4–1.0)
B-Globulin SerPl Elph-Mcnc: 1 g/dL (ref 0.7–1.3)
Gamma Glob SerPl Elph-Mcnc: 2.6 g/dL — ABNORMAL HIGH (ref 0.4–1.8)
Globulin, Total: 4.6 g/dL — ABNORMAL HIGH (ref 2.2–3.9)
IgA: 27 mg/dL — ABNORMAL LOW (ref 64–422)
IgG (Immunoglobin G), Serum: 3197 mg/dL — ABNORMAL HIGH (ref 700–1600)
IgM (Immunoglobulin M), Srm: 35 mg/dL (ref 26–217)
M Protein SerPl Elph-Mcnc: 2.3 g/dL — ABNORMAL HIGH
Total Protein ELP: 8.6 g/dL — ABNORMAL HIGH (ref 6.0–8.5)
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
## 2018-07-04 ENCOUNTER — Telehealth: Payer: Self-pay

## 2018-07-04 ENCOUNTER — Inpatient Hospital Stay: Payer: Medicare HMO | Attending: Hematology | Admitting: Hematology

## 2018-07-04 VITALS — BP 153/92 | HR 79 | Temp 98.0°F | Resp 18 | Ht 64.0 in | Wt 155.7 lb

## 2018-07-04 DIAGNOSIS — C9000 Multiple myeloma not having achieved remission: Secondary | ICD-10-CM | POA: Diagnosis not present

## 2018-07-04 DIAGNOSIS — D709 Neutropenia, unspecified: Secondary | ICD-10-CM

## 2018-07-04 DIAGNOSIS — D472 Monoclonal gammopathy: Secondary | ICD-10-CM

## 2018-07-04 NOTE — Patient Instructions (Signed)
Thank you for choosing [facility] to provide your oncology and hematology care.  To afford each patient quality time with our providers, please arrive 30 minutes before your scheduled appointment time.  If you arrive late for your appointment, you may be asked to reschedule.  We strive to give you quality time with our providers, and arriving late affects you and other patients whose appointments are after yours.    If you are a no show for multiple scheduled visits, you may be dismissed from the clinic at the providers discretion.     Again, thank you for choosing [facility], our hope is that these requests will decrease the amount of time that you wait before being seen by our physicians.  ______________________________________________________________________   Should you have questions after your visit to the [facility], please contact our office at [phone] between the hours of 8:30 and 4:30 p.m.    Voicemails left after 4:30p.m will not be returned until the following business day.     For prescription refill requests, please have your pharmacy contact us directly.  Please also try to allow 48 hours for prescription requests.     Please contact the scheduling department for questions regarding scheduling.  For scheduling of procedures such as PET scans, CT scans, MRI, Ultrasound, etc please contact central scheduling at [phone].     Resources For Cancer Patients and Caregivers:    Oncolink.org:  A wonderful resource for patients and healthcare providers for information regarding your disease, ways to tract your treatment, what to expect, etc.      American Cancer Society:  800-227-2345  Can help patients locate various types of support and financial assistance   Cancer Care: 1-800-813-HOPE (4673) Provides financial assistance, online support groups, medication/co-pay assistance.     [place] DSS:  336-641-3447 Where to apply  for food stamps, Medicaid, and utility assistance   Medicare Rights Center: 800-333-4114 Helps people with Medicare understand their rights and benefits, navigate the Medicare system, and secure the quality healthcare they deserve   SCAT: 336-333-6589 [place] [employer]'s shared-ride transportation service for eligible riders who have a disability that prevents them from riding the fixed route bus.     For additional information on assistance programs please contact our social worker:   Abigail Elmore:  [phone]  

## 2018-07-04 NOTE — Telephone Encounter (Signed)
Printed avs and calender of upcoming appointment. Per 1/6 los 

## 2018-07-27 ENCOUNTER — Telehealth: Payer: Self-pay | Admitting: *Deleted

## 2018-07-27 NOTE — Telephone Encounter (Signed)
Received call from patient.  She states she is having pain in her left shoulder-"deep in there".. she wasn't sure who to call 1st.  She sees Dr. Irene Limbo here @ [facility]. Advised pt to contact her PCP, Dr. Harlan Stains or her orthopedic doctor.. she has had issues with her shoulders in the past and has received injections in her right shoulder.  Pt voiced understanding and stated she would contact her PCP.

## 2018-07-28 DIAGNOSIS — M25512 Pain in left shoulder: Secondary | ICD-10-CM | POA: Diagnosis not present

## 2018-07-28 DIAGNOSIS — H401133 Primary open-angle glaucoma, bilateral, severe stage: Secondary | ICD-10-CM | POA: Diagnosis not present

## 2018-08-15 DIAGNOSIS — M25512 Pain in left shoulder: Secondary | ICD-10-CM | POA: Diagnosis not present

## 2018-08-16 ENCOUNTER — Ambulatory Visit: Payer: Medicare HMO | Admitting: Podiatry

## 2018-09-06 DIAGNOSIS — Z809 Family history of malignant neoplasm, unspecified: Secondary | ICD-10-CM | POA: Diagnosis not present

## 2018-09-06 DIAGNOSIS — E785 Hyperlipidemia, unspecified: Secondary | ICD-10-CM | POA: Diagnosis not present

## 2018-09-06 DIAGNOSIS — Z833 Family history of diabetes mellitus: Secondary | ICD-10-CM | POA: Diagnosis not present

## 2018-09-06 DIAGNOSIS — J309 Allergic rhinitis, unspecified: Secondary | ICD-10-CM | POA: Diagnosis not present

## 2018-09-06 DIAGNOSIS — H409 Unspecified glaucoma: Secondary | ICD-10-CM | POA: Diagnosis not present

## 2018-09-06 DIAGNOSIS — R69 Illness, unspecified: Secondary | ICD-10-CM | POA: Diagnosis not present

## 2018-09-06 DIAGNOSIS — M810 Age-related osteoporosis without current pathological fracture: Secondary | ICD-10-CM | POA: Diagnosis not present

## 2018-09-06 DIAGNOSIS — I10 Essential (primary) hypertension: Secondary | ICD-10-CM | POA: Diagnosis not present

## 2018-09-06 DIAGNOSIS — Z79899 Other long term (current) drug therapy: Secondary | ICD-10-CM | POA: Diagnosis not present

## 2018-09-06 DIAGNOSIS — K08409 Partial loss of teeth, unspecified cause, unspecified class: Secondary | ICD-10-CM | POA: Diagnosis not present

## 2018-10-27 DIAGNOSIS — R69 Illness, unspecified: Secondary | ICD-10-CM | POA: Diagnosis not present

## 2018-10-27 DIAGNOSIS — I10 Essential (primary) hypertension: Secondary | ICD-10-CM | POA: Diagnosis not present

## 2018-10-27 DIAGNOSIS — E785 Hyperlipidemia, unspecified: Secondary | ICD-10-CM | POA: Diagnosis not present

## 2018-10-27 DIAGNOSIS — E559 Vitamin D deficiency, unspecified: Secondary | ICD-10-CM | POA: Diagnosis not present

## 2018-10-27 DIAGNOSIS — R7303 Prediabetes: Secondary | ICD-10-CM | POA: Diagnosis not present

## 2018-10-27 DIAGNOSIS — C9000 Multiple myeloma not having achieved remission: Secondary | ICD-10-CM | POA: Diagnosis not present

## 2018-10-27 DIAGNOSIS — L989 Disorder of the skin and subcutaneous tissue, unspecified: Secondary | ICD-10-CM | POA: Diagnosis not present

## 2018-10-27 DIAGNOSIS — M79641 Pain in right hand: Secondary | ICD-10-CM | POA: Diagnosis not present

## 2018-10-27 DIAGNOSIS — M8588 Other specified disorders of bone density and structure, other site: Secondary | ICD-10-CM | POA: Diagnosis not present

## 2018-10-31 ENCOUNTER — Other Ambulatory Visit: Payer: Self-pay

## 2018-10-31 ENCOUNTER — Inpatient Hospital Stay: Payer: Medicare HMO | Attending: Hematology

## 2018-10-31 DIAGNOSIS — D472 Monoclonal gammopathy: Secondary | ICD-10-CM

## 2018-10-31 DIAGNOSIS — I10 Essential (primary) hypertension: Secondary | ICD-10-CM | POA: Diagnosis not present

## 2018-10-31 DIAGNOSIS — M8580 Other specified disorders of bone density and structure, unspecified site: Secondary | ICD-10-CM | POA: Insufficient documentation

## 2018-10-31 DIAGNOSIS — D709 Neutropenia, unspecified: Secondary | ICD-10-CM | POA: Diagnosis not present

## 2018-10-31 DIAGNOSIS — C9000 Multiple myeloma not having achieved remission: Secondary | ICD-10-CM | POA: Insufficient documentation

## 2018-10-31 LAB — CMP (CANCER CENTER ONLY)
ALT: 28 U/L (ref 0–44)
AST: 28 U/L (ref 15–41)
Albumin: 3.8 g/dL (ref 3.5–5.0)
Alkaline Phosphatase: 63 U/L (ref 38–126)
Anion gap: 8 (ref 5–15)
BUN: 14 mg/dL (ref 8–23)
CO2: 24 mmol/L (ref 22–32)
Calcium: 9 mg/dL (ref 8.9–10.3)
Chloride: 111 mmol/L (ref 98–111)
Creatinine: 0.83 mg/dL (ref 0.44–1.00)
GFR, Est AFR Am: >60 mL/min (ref >60)
GFR, Estimated: >60 mL/min (ref >60)
Glucose, Bld: 104 mg/dL — ABNORMAL HIGH (ref 70–99)
Potassium: 3.8 mmol/L (ref 3.5–5.1)
Sodium: 143 mmol/L (ref 135–145)
Total Bilirubin: 0.3 mg/dL (ref 0.3–1.2)
Total Protein: 9.2 g/dL — ABNORMAL HIGH (ref 6.5–8.1)

## 2018-10-31 LAB — CBC WITH DIFFERENTIAL/PLATELET
Abs Immature Granulocytes: 0 10*3/uL (ref 0.00–0.07)
Basophils Absolute: 0 10*3/uL (ref 0.0–0.1)
Basophils Relative: 1 %
Eosinophils Absolute: 0.1 10*3/uL (ref 0.0–0.5)
Eosinophils Relative: 3 %
HCT: 36.8 % (ref 36.0–46.0)
Hemoglobin: 12.1 g/dL (ref 12.0–15.0)
Immature Granulocytes: 0 %
Lymphocytes Relative: 47 %
Lymphs Abs: 1.3 10*3/uL (ref 0.7–4.0)
MCH: 33.7 pg (ref 26.0–34.0)
MCHC: 32.9 g/dL (ref 30.0–36.0)
MCV: 102.5 fL — ABNORMAL HIGH (ref 80.0–100.0)
Monocytes Absolute: 0.4 10*3/uL (ref 0.1–1.0)
Monocytes Relative: 14 %
Neutro Abs: 1 10*3/uL — ABNORMAL LOW (ref 1.7–7.7)
Neutrophils Relative %: 35 %
Platelets: 181 10*3/uL (ref 150–400)
RBC: 3.59 MIL/uL — ABNORMAL LOW (ref 3.87–5.11)
RDW: 14.2 % (ref 11.5–15.5)
WBC: 2.7 10*3/uL — ABNORMAL LOW (ref 4.0–10.5)
nRBC: 0 % (ref 0.0–0.2)

## 2018-11-01 LAB — MULTIPLE MYELOMA PANEL, SERUM
Albumin SerPl Elph-Mcnc: 4.1 g/dL (ref 2.9–4.4)
Albumin/Glob SerPl: 1 (ref 0.7–1.7)
Alpha 1: 0.2 g/dL (ref 0.0–0.4)
Alpha2 Glob SerPl Elph-Mcnc: 0.8 g/dL (ref 0.4–1.0)
B-Globulin SerPl Elph-Mcnc: 0.8 g/dL (ref 0.7–1.3)
Gamma Glob SerPl Elph-Mcnc: 2.6 g/dL — ABNORMAL HIGH (ref 0.4–1.8)
Globulin, Total: 4.4 g/dL — ABNORMAL HIGH (ref 2.2–3.9)
IgA: 18 mg/dL — ABNORMAL LOW (ref 64–422)
IgG (Immunoglobin G), Serum: 3010 mg/dL — ABNORMAL HIGH (ref 586–1602)
IgM (Immunoglobulin M), Srm: 35 mg/dL (ref 26–217)
M Protein SerPl Elph-Mcnc: 2.4 g/dL — ABNORMAL HIGH
Total Protein ELP: 8.5 g/dL (ref 6.0–8.5)

## 2018-11-01 LAB — VITAMIN D 25 HYDROXY (VIT D DEFICIENCY, FRACTURES): Vit D, 25-Hydroxy: 47.5 ng/mL (ref 30.0–100.0)
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
## 2018-11-07 ENCOUNTER — Other Ambulatory Visit: Payer: Self-pay

## 2018-11-07 ENCOUNTER — Inpatient Hospital Stay: Payer: Medicare HMO | Admitting: Hematology

## 2018-11-07 VITALS — BP 137/82 | HR 68 | Temp 97.9°F | Resp 17 | Ht 64.0 in | Wt 155.5 lb

## 2018-11-07 DIAGNOSIS — M8589 Other specified disorders of bone density and structure, multiple sites: Secondary | ICD-10-CM

## 2018-11-07 DIAGNOSIS — C9000 Multiple myeloma not having achieved remission: Secondary | ICD-10-CM | POA: Diagnosis not present

## 2018-11-07 DIAGNOSIS — D709 Neutropenia, unspecified: Secondary | ICD-10-CM

## 2018-11-07 DIAGNOSIS — D472 Monoclonal gammopathy: Secondary | ICD-10-CM | POA: Insufficient documentation

## 2018-11-07 DIAGNOSIS — I10 Essential (primary) hypertension: Secondary | ICD-10-CM

## 2018-11-07 DIAGNOSIS — M8580 Other specified disorders of bone density and structure, unspecified site: Secondary | ICD-10-CM

## 2018-11-07 DIAGNOSIS — Z7189 Other specified counseling: Secondary | ICD-10-CM

## 2018-11-08 ENCOUNTER — Telehealth: Payer: Self-pay | Admitting: Hematology

## 2018-11-08 ENCOUNTER — Other Ambulatory Visit: Payer: Self-pay | Admitting: Family Medicine

## 2018-11-08 DIAGNOSIS — Z1231 Encounter for screening mammogram for malignant neoplasm of breast: Secondary | ICD-10-CM

## 2018-11-08 NOTE — Telephone Encounter (Signed)
Scheduled appt per 5/11 los. ° °A calendar will be mailed out. °

## 2018-12-01 DIAGNOSIS — M25512 Pain in left shoulder: Secondary | ICD-10-CM | POA: Diagnosis not present

## 2018-12-05 ENCOUNTER — Telehealth: Payer: Self-pay | Admitting: *Deleted

## 2018-12-05 ENCOUNTER — Other Ambulatory Visit: Payer: Self-pay

## 2018-12-05 ENCOUNTER — Inpatient Hospital Stay: Payer: Medicare HMO | Attending: Hematology

## 2018-12-05 ENCOUNTER — Inpatient Hospital Stay: Payer: Medicare HMO

## 2018-12-05 DIAGNOSIS — M8580 Other specified disorders of bone density and structure, unspecified site: Secondary | ICD-10-CM | POA: Insufficient documentation

## 2018-12-05 DIAGNOSIS — C9000 Multiple myeloma not having achieved remission: Secondary | ICD-10-CM | POA: Insufficient documentation

## 2018-12-05 DIAGNOSIS — D472 Monoclonal gammopathy: Secondary | ICD-10-CM

## 2018-12-05 LAB — CMP (CANCER CENTER ONLY)
ALT: 26 U/L (ref 0–44)
AST: 25 U/L (ref 15–41)
Albumin: 3.9 g/dL (ref 3.5–5.0)
Alkaline Phosphatase: 65 U/L (ref 38–126)
Anion gap: 5 (ref 5–15)
BUN: 15 mg/dL (ref 8–23)
CO2: 26 mmol/L (ref 22–32)
Calcium: 9.7 mg/dL (ref 8.9–10.3)
Chloride: 109 mmol/L (ref 98–111)
Creatinine: 0.97 mg/dL (ref 0.44–1.00)
GFR, Est AFR Am: >60 mL/min (ref >60)
GFR, Estimated: 58 mL/min — ABNORMAL LOW (ref >60)
Glucose, Bld: 91 mg/dL (ref 70–99)
Potassium: 4.1 mmol/L (ref 3.5–5.1)
Sodium: 140 mmol/L (ref 135–145)
Total Bilirubin: 0.4 mg/dL (ref 0.3–1.2)
Total Protein: 9.2 g/dL — ABNORMAL HIGH (ref 6.5–8.1)

## 2018-12-05 NOTE — Telephone Encounter (Signed)
Dr. Grier Mitts office contacted by Tracy Fisher, pharmacist w/[employer] [facility] on 6/5 Friday to ask if patient can receive Xgeva instead of Prolia. Nurse gave clinical information to pharmacist and sent pharmacist's question to Dr. Irene Limbo. Per Dr. Irene Limbo, patient cannot receive xgeva as not appropriate med for patient at this time.  Patient came to [facility] today to receive Prolia. Tracy Fisher contacted - Dr. Grier Mitts office informed that Tracy Fisher was denied and will need an appeal. Tracy Fisher gave contact number for appeal. Patient did not receive injection today. Contacted [employer] number. Rep Deloris.  Repeated clinical information for expedited appeal. Appeal application [id number]. Will receive response this week per Deloris.

## 2018-12-05 NOTE — Progress Notes (Signed)
Pt. in today for prolia injection, Pt did not have labs drawn today, Spoke with [facility] RN Dr. Grier Mitts desk nurse, who stated Pt. should get labs but was not approved to receive injection. She stated this was told to Dr. Irene Limbo, She also stated she would call  Pt.'s insurance company to try to get prior authorization while Pt. is getting labs done. As well as ask Dr. Irene Limbo if she could get xgeva instead. Pt. sent to lab, labs drawn, received a second call from [facility] stating per Dr. Irene Limbo Pt. can only get prolia so appointment has to be canceled until will get approval for medication. Relayed this to Pt. who verbalized understanding. Informed her that she would get a call to reschedule her appointments once we get approval for the medication.

## 2018-12-06 NOTE — Telephone Encounter (Signed)
Received call/voice mail from [employer] - Expedited appeal in process for Prolia. Should have response by 12/08/2018. Requested medical records to be faxed to [phone]. Advised to call for questions - no number given for contact. Faxed demographics and past 2 office visits w/Dr. Irene Limbo (include reports) to [phone]. Fax confirmation received.

## 2018-12-08 DIAGNOSIS — M65341 Trigger finger, right ring finger: Secondary | ICD-10-CM | POA: Diagnosis not present

## 2018-12-09 ENCOUNTER — Telehealth: Payer: Self-pay | Admitting: Hematology

## 2018-12-09 ENCOUNTER — Telehealth: Payer: Self-pay | Admitting: *Deleted

## 2018-12-09 NOTE — Telephone Encounter (Signed)
Spoke with patient re 6/16 appointment.

## 2018-12-09 NOTE — Telephone Encounter (Signed)
Prolia injection approved by [employer] - for 2 doses in period from 12/06/2018 to 12/06/2019. Approval # [id number]. Contacted patient to let her know medication approved. Scheduled message sent to schedule patient for injection at her convenience next week. Patient verbalized understanding.

## 2018-12-13 ENCOUNTER — Other Ambulatory Visit: Payer: Self-pay

## 2018-12-13 ENCOUNTER — Inpatient Hospital Stay: Payer: Medicare HMO

## 2018-12-13 VITALS — BP 140/94 | HR 67 | Temp 98.3°F | Resp 16

## 2018-12-13 DIAGNOSIS — M8589 Other specified disorders of bone density and structure, multiple sites: Secondary | ICD-10-CM

## 2018-12-13 DIAGNOSIS — Z7189 Other specified counseling: Secondary | ICD-10-CM

## 2018-12-13 DIAGNOSIS — M8580 Other specified disorders of bone density and structure, unspecified site: Secondary | ICD-10-CM | POA: Diagnosis not present

## 2018-12-13 DIAGNOSIS — C9000 Multiple myeloma not having achieved remission: Secondary | ICD-10-CM | POA: Diagnosis not present

## 2018-12-13 DIAGNOSIS — D472 Monoclonal gammopathy: Secondary | ICD-10-CM

## 2018-12-13 MED ORDER — DENOSUMAB 60 MG/ML ~~LOC~~ SOSY
PREFILLED_SYRINGE | SUBCUTANEOUS | Status: AC
  Filled 2018-12-13: qty 1

## 2018-12-13 MED ORDER — DENOSUMAB 60 MG/ML ~~LOC~~ SOSY
60.0000 mg | PREFILLED_SYRINGE | Freq: Once | SUBCUTANEOUS | Status: AC
  Administered 2018-12-13: 60 mg via SUBCUTANEOUS

## 2018-12-13 NOTE — Progress Notes (Signed)
Per Dr. Ocie Bob to give injection with 12/05/18 labs calcium 9.7

## 2018-12-13 NOTE — Patient Instructions (Signed)

## 2018-12-29 ENCOUNTER — Ambulatory Visit
Admission: RE | Admit: 2018-12-29 | Discharge: 2018-12-29 | Disposition: A | Discharge status: Home / Self Care | Payer: Medicare HMO | Source: Ambulatory Visit | Attending: Family Medicine | Admitting: Family Medicine

## 2018-12-29 ENCOUNTER — Other Ambulatory Visit: Payer: Self-pay

## 2018-12-29 ENCOUNTER — Telehealth: Payer: Self-pay | Admitting: *Deleted

## 2018-12-29 DIAGNOSIS — Z1231 Encounter for screening mammogram for malignant neoplasm of breast: Secondary | ICD-10-CM | POA: Diagnosis not present

## 2018-12-29 NOTE — Telephone Encounter (Signed)
Patient called to ask how she had to pay for injection she just received. Informed her that [place] will bill insurance company and once her insurance pays, then [facility] will let her know if she has a balance. She verbalized understanding.

## 2019-01-24 DIAGNOSIS — E785 Hyperlipidemia, unspecified: Secondary | ICD-10-CM | POA: Diagnosis not present

## 2019-01-24 DIAGNOSIS — I10 Essential (primary) hypertension: Secondary | ICD-10-CM | POA: Diagnosis not present

## 2019-01-24 DIAGNOSIS — R69 Illness, unspecified: Secondary | ICD-10-CM | POA: Diagnosis not present

## 2019-01-25 DIAGNOSIS — Z961 Presence of intraocular lens: Secondary | ICD-10-CM | POA: Diagnosis not present

## 2019-01-25 DIAGNOSIS — H35372 Puckering of macula, left eye: Secondary | ICD-10-CM | POA: Diagnosis not present

## 2019-01-25 DIAGNOSIS — H401111 Primary open-angle glaucoma, right eye, mild stage: Secondary | ICD-10-CM | POA: Diagnosis not present

## 2019-01-25 DIAGNOSIS — H401123 Primary open-angle glaucoma, left eye, severe stage: Secondary | ICD-10-CM | POA: Diagnosis not present

## 2019-01-26 DIAGNOSIS — M75112 Incomplete rotator cuff tear or rupture of left shoulder, not specified as traumatic: Secondary | ICD-10-CM | POA: Diagnosis not present

## 2019-03-03 ENCOUNTER — Inpatient Hospital Stay: Payer: Medicare HMO | Attending: Hematology

## 2019-03-03 ENCOUNTER — Other Ambulatory Visit: Payer: Self-pay

## 2019-03-03 DIAGNOSIS — M8589 Other specified disorders of bone density and structure, multiple sites: Secondary | ICD-10-CM

## 2019-03-03 DIAGNOSIS — C9000 Multiple myeloma not having achieved remission: Secondary | ICD-10-CM | POA: Diagnosis present

## 2019-03-03 DIAGNOSIS — D709 Neutropenia, unspecified: Secondary | ICD-10-CM

## 2019-03-03 DIAGNOSIS — D472 Monoclonal gammopathy: Secondary | ICD-10-CM

## 2019-03-03 LAB — CBC WITH DIFFERENTIAL/PLATELET
Abs Immature Granulocytes: 0.01 10*3/uL (ref 0.00–0.07)
Basophils Absolute: 0 10*3/uL (ref 0.0–0.1)
Basophils Relative: 1 %
Eosinophils Absolute: 0.1 10*3/uL (ref 0.0–0.5)
Eosinophils Relative: 3 %
HCT: 35.6 % — ABNORMAL LOW (ref 36.0–46.0)
Hemoglobin: 12 g/dL (ref 12.0–15.0)
Immature Granulocytes: 0 %
Lymphocytes Relative: 49 %
Lymphs Abs: 1.3 10*3/uL (ref 0.7–4.0)
MCH: 33.3 pg (ref 26.0–34.0)
MCHC: 33.7 g/dL (ref 30.0–36.0)
MCV: 98.9 fL (ref 80.0–100.0)
Monocytes Absolute: 0.3 10*3/uL (ref 0.1–1.0)
Monocytes Relative: 10 %
Neutro Abs: 1 10*3/uL — ABNORMAL LOW (ref 1.7–7.7)
Neutrophils Relative %: 37 %
Platelets: 178 10*3/uL (ref 150–400)
RBC: 3.6 MIL/uL — ABNORMAL LOW (ref 3.87–5.11)
RDW: 13.8 % (ref 11.5–15.5)
WBC: 2.6 10*3/uL — ABNORMAL LOW (ref 4.0–10.5)
nRBC: 0 % (ref 0.0–0.2)

## 2019-03-03 LAB — CMP (CANCER CENTER ONLY)
ALT: 26 U/L (ref 0–44)
AST: 28 U/L (ref 15–41)
Albumin: 3.8 g/dL (ref 3.5–5.0)
Alkaline Phosphatase: 49 U/L (ref 38–126)
Anion gap: 6 (ref 5–15)
BUN: 12 mg/dL (ref 8–23)
CO2: 22 mmol/L (ref 22–32)
Calcium: 8.8 mg/dL — ABNORMAL LOW (ref 8.9–10.3)
Chloride: 108 mmol/L (ref 98–111)
Creatinine: 0.78 mg/dL (ref 0.44–1.00)
GFR, Est AFR Am: >60 mL/min (ref >60)
GFR, Estimated: >60 mL/min (ref >60)
Glucose, Bld: 131 mg/dL — ABNORMAL HIGH (ref 70–99)
Potassium: 3.5 mmol/L (ref 3.5–5.1)
Sodium: 136 mmol/L (ref 135–145)
Total Bilirubin: 0.4 mg/dL (ref 0.3–1.2)
Total Protein: 8.9 g/dL — ABNORMAL HIGH (ref 6.5–8.1)

## 2019-03-07 DIAGNOSIS — M67912 Unspecified disorder of synovium and tendon, left shoulder: Secondary | ICD-10-CM | POA: Diagnosis not present

## 2019-03-07 DIAGNOSIS — M25512 Pain in left shoulder: Secondary | ICD-10-CM | POA: Diagnosis not present

## 2019-03-07 LAB — MULTIPLE MYELOMA PANEL, SERUM
Albumin SerPl Elph-Mcnc: 3.9 g/dL (ref 2.9–4.4)
Albumin/Glob SerPl: 0.9 (ref 0.7–1.7)
Alpha 1: 0.2 g/dL (ref 0.0–0.4)
Alpha2 Glob SerPl Elph-Mcnc: 0.7 g/dL (ref 0.4–1.0)
B-Globulin SerPl Elph-Mcnc: 0.8 g/dL (ref 0.7–1.3)
Gamma Glob SerPl Elph-Mcnc: 2.8 g/dL — ABNORMAL HIGH (ref 0.4–1.8)
Globulin, Total: 4.6 g/dL — ABNORMAL HIGH (ref 2.2–3.9)
IgA: 17 mg/dL — ABNORMAL LOW (ref 64–422)
IgG (Immunoglobin G), Serum: 2983 mg/dL — ABNORMAL HIGH (ref 586–1602)
IgM (Immunoglobulin M), Srm: 34 mg/dL (ref 26–217)
M Protein SerPl Elph-Mcnc: 2.5 g/dL — ABNORMAL HIGH
Total Protein ELP: 8.5 g/dL (ref 6.0–8.5)
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
## 2019-03-10 ENCOUNTER — Telehealth: Payer: Self-pay | Admitting: Hematology

## 2019-03-10 ENCOUNTER — Other Ambulatory Visit: Payer: Self-pay

## 2019-03-10 ENCOUNTER — Inpatient Hospital Stay: Payer: Medicare HMO | Admitting: Hematology

## 2019-03-10 VITALS — BP 131/79 | HR 82 | Temp 98.2°F | Resp 18 | Ht 64.0 in | Wt 151.3 lb

## 2019-03-10 DIAGNOSIS — C9000 Multiple myeloma not having achieved remission: Secondary | ICD-10-CM

## 2019-03-10 DIAGNOSIS — D472 Monoclonal gammopathy: Secondary | ICD-10-CM

## 2019-03-10 NOTE — Telephone Encounter (Signed)
January 2021 lab moved to be 1 week prior to f/u. Informed patient and updated calendar.

## 2019-03-10 NOTE — Telephone Encounter (Signed)
Gave patient avs report and appointments for December 2020 thru December 2021. Patient last injection was 6/16. Already on schedule for 06/06/19. Added additional injections q40mo per 9/11 los.

## 2019-03-14 DIAGNOSIS — I10 Essential (primary) hypertension: Secondary | ICD-10-CM | POA: Diagnosis not present

## 2019-03-14 DIAGNOSIS — R69 Illness, unspecified: Secondary | ICD-10-CM | POA: Diagnosis not present

## 2019-03-14 DIAGNOSIS — E785 Hyperlipidemia, unspecified: Secondary | ICD-10-CM | POA: Diagnosis not present

## 2019-03-30 DIAGNOSIS — R69 Illness, unspecified: Secondary | ICD-10-CM | POA: Diagnosis not present

## 2019-05-16 DIAGNOSIS — E785 Hyperlipidemia, unspecified: Secondary | ICD-10-CM | POA: Diagnosis not present

## 2019-05-16 DIAGNOSIS — E559 Vitamin D deficiency, unspecified: Secondary | ICD-10-CM | POA: Diagnosis not present

## 2019-05-16 DIAGNOSIS — M8588 Other specified disorders of bone density and structure, other site: Secondary | ICD-10-CM | POA: Diagnosis not present

## 2019-05-16 DIAGNOSIS — R7303 Prediabetes: Secondary | ICD-10-CM | POA: Diagnosis not present

## 2019-05-16 DIAGNOSIS — R490 Dysphonia: Secondary | ICD-10-CM | POA: Diagnosis not present

## 2019-05-16 DIAGNOSIS — I10 Essential (primary) hypertension: Secondary | ICD-10-CM | POA: Diagnosis not present

## 2019-05-16 DIAGNOSIS — K219 Gastro-esophageal reflux disease without esophagitis: Secondary | ICD-10-CM | POA: Diagnosis not present

## 2019-05-16 DIAGNOSIS — C9000 Multiple myeloma not having achieved remission: Secondary | ICD-10-CM | POA: Diagnosis not present

## 2019-05-16 DIAGNOSIS — D709 Neutropenia, unspecified: Secondary | ICD-10-CM | POA: Diagnosis not present

## 2019-05-16 DIAGNOSIS — Z0000 Encounter for general adult medical examination without abnormal findings: Secondary | ICD-10-CM | POA: Diagnosis not present

## 2019-05-29 DIAGNOSIS — M25562 Pain in left knee: Secondary | ICD-10-CM | POA: Diagnosis not present

## 2019-06-02 DIAGNOSIS — M1712 Unilateral primary osteoarthritis, left knee: Secondary | ICD-10-CM | POA: Diagnosis not present

## 2019-06-06 ENCOUNTER — Inpatient Hospital Stay: Payer: Medicare HMO | Attending: Hematology

## 2019-06-06 ENCOUNTER — Other Ambulatory Visit: Payer: Self-pay

## 2019-06-06 ENCOUNTER — Inpatient Hospital Stay: Payer: Medicare HMO

## 2019-06-06 VITALS — BP 132/78 | HR 78 | Temp 98.2°F | Resp 18

## 2019-06-06 DIAGNOSIS — C9000 Multiple myeloma not having achieved remission: Secondary | ICD-10-CM | POA: Insufficient documentation

## 2019-06-06 DIAGNOSIS — Z7189 Other specified counseling: Secondary | ICD-10-CM

## 2019-06-06 DIAGNOSIS — M8589 Other specified disorders of bone density and structure, multiple sites: Secondary | ICD-10-CM

## 2019-06-06 DIAGNOSIS — D472 Monoclonal gammopathy: Secondary | ICD-10-CM

## 2019-06-06 LAB — CBC WITH DIFFERENTIAL (CANCER CENTER ONLY)
Abs Immature Granulocytes: 0 10*3/uL (ref 0.00–0.07)
Basophils Absolute: 0 10*3/uL (ref 0.0–0.1)
Basophils Relative: 1 %
Eosinophils Absolute: 0 10*3/uL (ref 0.0–0.5)
Eosinophils Relative: 1 %
HCT: 37.1 % (ref 36.0–46.0)
Hemoglobin: 12.4 g/dL (ref 12.0–15.0)
Immature Granulocytes: 0 %
Lymphocytes Relative: 26 %
Lymphs Abs: 1 10*3/uL (ref 0.7–4.0)
MCH: 34.4 pg — ABNORMAL HIGH (ref 26.0–34.0)
MCHC: 33.4 g/dL (ref 30.0–36.0)
MCV: 103.1 fL — ABNORMAL HIGH (ref 80.0–100.0)
Monocytes Absolute: 0.4 10*3/uL (ref 0.1–1.0)
Monocytes Relative: 11 %
Neutro Abs: 2.4 10*3/uL (ref 1.7–7.7)
Neutrophils Relative %: 61 %
Platelet Count: 183 10*3/uL (ref 150–400)
RBC: 3.6 MIL/uL — ABNORMAL LOW (ref 3.87–5.11)
RDW: 14 % (ref 11.5–15.5)
WBC Count: 3.9 10*3/uL — ABNORMAL LOW (ref 4.0–10.5)
nRBC: 0 % (ref 0.0–0.2)

## 2019-06-06 LAB — CMP (CANCER CENTER ONLY)
ALT: 29 U/L (ref 0–44)
AST: 25 U/L (ref 15–41)
Albumin: 3.6 g/dL (ref 3.5–5.0)
Alkaline Phosphatase: 54 U/L (ref 38–126)
Anion gap: 8 (ref 5–15)
BUN: 12 mg/dL (ref 8–23)
CO2: 26 mmol/L (ref 22–32)
Calcium: 9.4 mg/dL (ref 8.9–10.3)
Chloride: 106 mmol/L (ref 98–111)
Creatinine: 1.03 mg/dL — ABNORMAL HIGH (ref 0.44–1.00)
GFR, Est AFR Am: >60 mL/min (ref >60)
GFR, Estimated: 54 mL/min — ABNORMAL LOW (ref >60)
Glucose, Bld: 91 mg/dL (ref 70–99)
Potassium: 3.7 mmol/L (ref 3.5–5.1)
Sodium: 140 mmol/L (ref 135–145)
Total Bilirubin: 0.3 mg/dL (ref 0.3–1.2)
Total Protein: 9.2 g/dL — ABNORMAL HIGH (ref 6.5–8.1)

## 2019-06-06 MED ORDER — DENOSUMAB 60 MG/ML ~~LOC~~ SOSY
60.0000 mg | PREFILLED_SYRINGE | Freq: Once | SUBCUTANEOUS | Status: AC
  Administered 2019-06-06: 60 mg via SUBCUTANEOUS

## 2019-06-06 MED ORDER — DENOSUMAB 60 MG/ML ~~LOC~~ SOSY
PREFILLED_SYRINGE | SUBCUTANEOUS | Status: AC
  Filled 2019-06-06: qty 1

## 2019-06-06 NOTE — Patient Instructions (Signed)

## 2019-06-27 DIAGNOSIS — R490 Dysphonia: Secondary | ICD-10-CM | POA: Diagnosis not present

## 2019-06-29 ENCOUNTER — Inpatient Hospital Stay: Payer: Medicare HMO

## 2019-06-29 ENCOUNTER — Other Ambulatory Visit: Payer: Self-pay

## 2019-06-29 DIAGNOSIS — C9000 Multiple myeloma not having achieved remission: Secondary | ICD-10-CM

## 2019-06-29 DIAGNOSIS — D472 Monoclonal gammopathy: Secondary | ICD-10-CM

## 2019-06-29 LAB — CMP (CANCER CENTER ONLY)
ALT: 26 U/L (ref 0–44)
AST: 26 U/L (ref 15–41)
Albumin: 3.8 g/dL (ref 3.5–5.0)
Alkaline Phosphatase: 57 U/L (ref 38–126)
Anion gap: 7 (ref 5–15)
BUN: 11 mg/dL (ref 8–23)
CO2: 23 mmol/L (ref 22–32)
Calcium: 9 mg/dL (ref 8.9–10.3)
Chloride: 108 mmol/L (ref 98–111)
Creatinine: 0.71 mg/dL (ref 0.44–1.00)
GFR, Est AFR Am: >60 mL/min (ref >60)
GFR, Estimated: >60 mL/min (ref >60)
Glucose, Bld: 77 mg/dL (ref 70–99)
Potassium: 3.5 mmol/L (ref 3.5–5.1)
Sodium: 138 mmol/L (ref 135–145)
Total Bilirubin: 0.3 mg/dL (ref 0.3–1.2)
Total Protein: 9.4 g/dL — ABNORMAL HIGH (ref 6.5–8.1)

## 2019-06-29 LAB — CBC WITH DIFFERENTIAL/PLATELET
Abs Immature Granulocytes: 0.01 10*3/uL (ref 0.00–0.07)
Basophils Absolute: 0 10*3/uL (ref 0.0–0.1)
Basophils Relative: 1 %
Eosinophils Absolute: 0 10*3/uL (ref 0.0–0.5)
Eosinophils Relative: 1 %
HCT: 35.5 % — ABNORMAL LOW (ref 36.0–46.0)
Hemoglobin: 12.2 g/dL (ref 12.0–15.0)
Immature Granulocytes: 0 %
Lymphocytes Relative: 46 %
Lymphs Abs: 1.5 10*3/uL (ref 0.7–4.0)
MCH: 33.7 pg (ref 26.0–34.0)
MCHC: 34.4 g/dL (ref 30.0–36.0)
MCV: 98.1 fL (ref 80.0–100.0)
Monocytes Absolute: 0.4 10*3/uL (ref 0.1–1.0)
Monocytes Relative: 12 %
Neutro Abs: 1.2 10*3/uL — ABNORMAL LOW (ref 1.7–7.7)
Neutrophils Relative %: 40 %
Platelets: 185 10*3/uL (ref 150–400)
RBC: 3.62 MIL/uL — ABNORMAL LOW (ref 3.87–5.11)
RDW: 14.1 % (ref 11.5–15.5)
WBC: 3.1 10*3/uL — ABNORMAL LOW (ref 4.0–10.5)
nRBC: 0 % (ref 0.0–0.2)

## 2019-07-03 LAB — MULTIPLE MYELOMA PANEL, SERUM
Albumin SerPl Elph-Mcnc: 4.2 g/dL (ref 2.9–4.4)
Albumin/Glob SerPl: 0.9 (ref 0.7–1.7)
Alpha 1: 0.2 g/dL (ref 0.0–0.4)
Alpha2 Glob SerPl Elph-Mcnc: 0.7 g/dL (ref 0.4–1.0)
B-Globulin SerPl Elph-Mcnc: 0.9 g/dL (ref 0.7–1.3)
Gamma Glob SerPl Elph-Mcnc: 2.9 g/dL — ABNORMAL HIGH (ref 0.4–1.8)
Globulin, Total: 4.7 g/dL — ABNORMAL HIGH (ref 2.2–3.9)
IgA: 18 mg/dL — ABNORMAL LOW (ref 64–422)
IgG (Immunoglobin G), Serum: 3591 mg/dL — ABNORMAL HIGH (ref 586–1602)
IgM (Immunoglobulin M), Srm: 33 mg/dL (ref 26–217)
M Protein SerPl Elph-Mcnc: 2.8 g/dL — ABNORMAL HIGH
Total Protein ELP: 8.9 g/dL — ABNORMAL HIGH (ref 6.0–8.5)

## 2019-07-03 LAB — KAPPA/LAMBDA LIGHT CHAINS
Kappa free light chain: 21.1 mg/L — ABNORMAL HIGH (ref 3.3–19.4)
Kappa, lambda light chain ratio: 5.02 — ABNORMAL HIGH (ref 0.26–1.65)
Lambda free light chains: 4.2 mg/L — ABNORMAL LOW (ref 5.7–26.3)

## 2019-07-06 ENCOUNTER — Telehealth: Payer: Self-pay | Admitting: Hematology

## 2019-07-06 NOTE — Telephone Encounter (Signed)
Returned patient's phone call regarding rescheduling 01/08, rescheduled appointment to 01/22. Left a voicemail.

## 2019-07-07 ENCOUNTER — Inpatient Hospital Stay: Payer: Medicare HMO | Admitting: Hematology

## 2019-07-07 ENCOUNTER — Other Ambulatory Visit: Payer: Medicare HMO

## 2019-07-21 ENCOUNTER — Other Ambulatory Visit: Payer: Self-pay

## 2019-07-21 ENCOUNTER — Inpatient Hospital Stay: Payer: Medicare HMO | Attending: Hematology | Admitting: Hematology

## 2019-07-21 VITALS — BP 114/84 | HR 74 | Temp 97.8°F | Resp 18 | Ht 64.0 in | Wt 152.9 lb

## 2019-07-21 DIAGNOSIS — R918 Other nonspecific abnormal finding of lung field: Secondary | ICD-10-CM | POA: Diagnosis not present

## 2019-07-21 DIAGNOSIS — R69 Illness, unspecified: Secondary | ICD-10-CM | POA: Diagnosis not present

## 2019-07-21 DIAGNOSIS — D709 Neutropenia, unspecified: Secondary | ICD-10-CM | POA: Diagnosis not present

## 2019-07-21 DIAGNOSIS — K589 Irritable bowel syndrome without diarrhea: Secondary | ICD-10-CM | POA: Insufficient documentation

## 2019-07-21 DIAGNOSIS — C9000 Multiple myeloma not having achieved remission: Secondary | ICD-10-CM

## 2019-07-21 DIAGNOSIS — E559 Vitamin D deficiency, unspecified: Secondary | ICD-10-CM | POA: Diagnosis not present

## 2019-07-21 DIAGNOSIS — Z79899 Other long term (current) drug therapy: Secondary | ICD-10-CM | POA: Insufficient documentation

## 2019-07-21 DIAGNOSIS — D472 Monoclonal gammopathy: Secondary | ICD-10-CM

## 2019-07-21 DIAGNOSIS — F329 Major depressive disorder, single episode, unspecified: Secondary | ICD-10-CM | POA: Diagnosis not present

## 2019-07-21 DIAGNOSIS — I10 Essential (primary) hypertension: Secondary | ICD-10-CM | POA: Diagnosis not present
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
## 2019-07-24 ENCOUNTER — Telehealth: Payer: Self-pay | Admitting: Hematology

## 2019-07-24 NOTE — Telephone Encounter (Signed)
Scheduled per 01/22 los, called patient and left a voicemail.

## 2019-07-26 DIAGNOSIS — H401111 Primary open-angle glaucoma, right eye, mild stage: Secondary | ICD-10-CM | POA: Diagnosis not present

## 2019-07-26 DIAGNOSIS — H401123 Primary open-angle glaucoma, left eye, severe stage: Secondary | ICD-10-CM | POA: Diagnosis not present

## 2019-08-03 ENCOUNTER — Other Ambulatory Visit: Payer: Self-pay | Admitting: Hematology

## 2019-08-03 DIAGNOSIS — C9000 Multiple myeloma not having achieved remission: Secondary | ICD-10-CM

## 2019-08-24 DIAGNOSIS — C9000 Multiple myeloma not having achieved remission: Secondary | ICD-10-CM | POA: Diagnosis not present

## 2019-08-24 DIAGNOSIS — J029 Acute pharyngitis, unspecified: Secondary | ICD-10-CM | POA: Diagnosis not present

## 2019-08-27 DIAGNOSIS — E785 Hyperlipidemia, unspecified: Secondary | ICD-10-CM | POA: Diagnosis not present

## 2019-08-27 DIAGNOSIS — I10 Essential (primary) hypertension: Secondary | ICD-10-CM | POA: Diagnosis not present

## 2019-08-27 DIAGNOSIS — R69 Illness, unspecified: Secondary | ICD-10-CM | POA: Diagnosis not present

## 2019-09-07 ENCOUNTER — Other Ambulatory Visit: Payer: Self-pay | Admitting: Student

## 2019-09-08 ENCOUNTER — Other Ambulatory Visit: Payer: Self-pay

## 2019-09-08 ENCOUNTER — Ambulatory Visit (HOSPITAL_COMMUNITY)
Admission: RE | Admit: 2019-09-08 | Discharge: 2019-09-08 | Disposition: A | Discharge status: Home / Self Care | Payer: Medicare HMO | Source: Ambulatory Visit | Attending: Hematology | Admitting: Hematology

## 2019-09-08 ENCOUNTER — Encounter (HOSPITAL_COMMUNITY): Payer: Self-pay

## 2019-09-08 DIAGNOSIS — D539 Nutritional anemia, unspecified: Secondary | ICD-10-CM | POA: Diagnosis not present

## 2019-09-08 DIAGNOSIS — Z79899 Other long term (current) drug therapy: Secondary | ICD-10-CM | POA: Diagnosis not present

## 2019-09-08 DIAGNOSIS — I10 Essential (primary) hypertension: Secondary | ICD-10-CM | POA: Insufficient documentation

## 2019-09-08 DIAGNOSIS — D72819 Decreased white blood cell count, unspecified: Secondary | ICD-10-CM | POA: Diagnosis not present

## 2019-09-08 DIAGNOSIS — M899 Disorder of bone, unspecified: Secondary | ICD-10-CM | POA: Diagnosis not present

## 2019-09-08 DIAGNOSIS — C9000 Multiple myeloma not having achieved remission: Secondary | ICD-10-CM | POA: Diagnosis not present

## 2019-09-08 DIAGNOSIS — D4989 Neoplasm of unspecified behavior of other specified sites: Secondary | ICD-10-CM | POA: Diagnosis not present

## 2019-09-08 HISTORY — DX: Other diseases of vocal cords: J38.3

## 2019-09-08 LAB — CBC WITH DIFFERENTIAL/PLATELET
Abs Immature Granulocytes: 0 10*3/uL (ref 0.00–0.07)
Basophils Absolute: 0 10*3/uL (ref 0.0–0.1)
Basophils Relative: 1 %
Eosinophils Absolute: 0.1 10*3/uL (ref 0.0–0.5)
Eosinophils Relative: 2 %
HCT: 35.8 % — ABNORMAL LOW (ref 36.0–46.0)
Hemoglobin: 11.9 g/dL — ABNORMAL LOW (ref 12.0–15.0)
Immature Granulocytes: 0 %
Lymphocytes Relative: 46 %
Lymphs Abs: 1.6 10*3/uL (ref 0.7–4.0)
MCH: 33.7 pg (ref 26.0–34.0)
MCHC: 33.2 g/dL (ref 30.0–36.0)
MCV: 101.4 fL — ABNORMAL HIGH (ref 80.0–100.0)
Monocytes Absolute: 0.4 10*3/uL (ref 0.1–1.0)
Monocytes Relative: 13 %
Neutro Abs: 1.3 10*3/uL — ABNORMAL LOW (ref 1.7–7.7)
Neutrophils Relative %: 38 %
Platelets: 187 10*3/uL (ref 150–400)
RBC: 3.53 MIL/uL — ABNORMAL LOW (ref 3.87–5.11)
RDW: 13.8 % (ref 11.5–15.5)
WBC: 3.5 10*3/uL — ABNORMAL LOW (ref 4.0–10.5)
nRBC: 0 % (ref 0.0–0.2)

## 2019-09-08 MED ORDER — SODIUM CHLORIDE 0.9 % IV SOLN: INTRAVENOUS | Status: DC

## 2019-09-08 MED ORDER — FENTANYL CITRATE (PF) 100 MCG/2ML IJ SOLN
INTRAMUSCULAR | Status: AC
  Filled 2019-09-08: qty 2

## 2019-09-08 MED ORDER — MIDAZOLAM HCL 2 MG/2ML IJ SOLN
INTRAMUSCULAR | Status: AC | PRN
  Administered 2019-09-08 (×2): 1 mg via INTRAVENOUS

## 2019-09-08 MED ORDER — MIDAZOLAM HCL 2 MG/2ML IJ SOLN
INTRAMUSCULAR | Status: AC
  Filled 2019-09-08: qty 4

## 2019-09-08 MED ORDER — FENTANYL CITRATE (PF) 100 MCG/2ML IJ SOLN
INTRAMUSCULAR | Status: AC | PRN
  Administered 2019-09-08 (×2): 50 ug via INTRAVENOUS

## 2019-09-08 MED ORDER — LIDOCAINE HCL (PF) 1 % IJ SOLN
INTRAMUSCULAR | Status: AC | PRN
  Administered 2019-09-08: 10 mL

## 2019-09-08 NOTE — H&P (Signed)
Referring Physician(s): Brunetta Genera  Supervising Physician: Aletta Edouard  Patient Status:  Tracy Fisher OP  Chief Complaint:  "I'm having a biopsy"  Subjective: Patient familiar to IR service from bone marrow biopsy in 2018.  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  She currently denies fever, headache, chest pain, dyspnea, cough, abdominal/back pain, nausea, vomiting or bleeding.  Past Medical History:  Diagnosis Date  . Hypertension   . Vocal cord cyst    History reviewed. No pertinent surgical history.    Allergies: Sulfa antibiotics  Medications: Prior to Admission medications   Medication Sig Start Date End Date Taking? Authorizing Provider  amLODipine (NORVASC) 5 MG tablet  02/01/15  Yes [provider]  atorvastatin (LIPITOR) 20 MG tablet  11/25/16  Yes [provider]  brimonidine (ALPHAGAN) 0.2 % ophthalmic solution Place 1 drop into both eyes 2 (two) times daily.  01/30/18  Yes [provider]  Calcium Carbonate-Vitamin D (CALCIUM + D PO) Take 1 tablet by mouth daily.   Yes [provider]  dorzolamide-timolol (COSOPT) 22.3-6.8 MG/ML ophthalmic solution Place 1 drop into both eyes 2 (two) times daily.  11/21/16  Yes [provider]  latanoprost (XALATAN) 0.005 % ophthalmic solution Place 1 drop into both eyes at bedtime.   Yes [provider]  mirtazapine (REMERON) 15 MG tablet Take 15 mg by mouth at bedtime.   Yes [provider]  Multiple Vitamin (MULTIVITAMIN) capsule Take by mouth.   Yes [provider]  metoCLOPramide (REGLAN) 10 MG tablet Take 1 tablet (10 mg total) by mouth every 8 (eight) hours as needed for nausea or vomiting. Patient not taking: Reported on 07/04/2018 06/04/18   Orlie Dakin, MD     Vital Signs: Pressure 162/102, temp 98.2, respirations 18, heart  rate 79, O2 sat 99% room air Ht '5\' 4"'  (1.626 m)   Wt 150 lb (68 kg)   BMI 25.75 kg/m   Physical Exam awake, alert.  Chest clear to auscultation bilaterally.  Heart with regular rate and rhythm.  Abdomen soft, positive bowel sounds, nontender.  No significant lower extremity edema.  Imaging: No results found.  Labs:  CBC: Recent Labs    03/03/19 1056 06/06/19 1206 06/29/19 1108 09/08/19 0938  WBC 2.6* 3.9* 3.1* 3.5*  HGB 12.0 12.4 12.2 11.9*  HCT 35.6* 37.1 35.5* 35.8*  PLT 178 183 185 187    COAGS: No results for input(s): INR, APTT in the last 8760 hours.  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  All of the questions were answered and there is agreement to proceed.  Consent signed and in chart.     Electronically Signed: D. Rowe Robert, PA-C 09/08/2019, 10:27 AM   I spent a total of 20 minutes at the the patient's bedside AND on the patient's hospital floor or unit, greater than 50% of which was counseling/coordinating care for CT-guided bone marrow  biopsy

## 2019-09-08 NOTE — Procedures (Signed)
Interventional Radiology Procedure Note  Procedure: CT guided bone marrow aspiration and biopsy  Complications: None  EBL: < 10 mL  Findings: Aspirate and core biopsy performed of bone marrow in right iliac bone.  Plan: Bedrest supine x 1 hrs  Jimi Schappert T. Jolissa Kapral, M.D Pager:  [phone]   

## 2019-09-08 NOTE — Discharge Instructions (Signed)
Bone Marrow Aspiration and Bone Marrow Biopsy, Adult, Care After This sheet gives you information about how to care for yourself after your procedure. Your health care provider may also give you more specific instructions. If you have problems or questions, contact your health care provider. What can I expect after the procedure? After the procedure, it is common to have:  Mild pain and tenderness.  Swelling.  Bruising. Follow these instructions at home: Puncture site care   Follow instructions from your health care provider about how to take care of the puncture site. Make sure you: ? Wash your hands with soap and water before and after you change your bandage (dressing). If soap and water are not available, use hand sanitizer. ? Change your dressing as told by your health care provider.  Check your puncture site every day for signs of infection. Check for: ? More redness, swelling, or pain. ? Fluid or blood. ? Warmth. ? Pus or a bad smell. Activity  Return to your normal activities as told by your health care provider. Ask your health care provider what activities are safe for you.  Do not lift anything that is heavier than 10 lb (4.5 kg), or the limit that you are told, until your health care provider says that it is safe.  Do not drive for 24 hours if you were given a sedative during your procedure. General instructions   Take over-the-counter and prescription medicines only as told by your health care provider.  Do not take baths, swim, or use a hot tub until your health care provider approves. Ask your health care provider if you may take showers. You may only be allowed to take sponge baths.  If directed, put ice on the affected area. To do this: ? Put ice in a plastic bag. ? Place a towel between your skin and the bag. ? Leave the ice on for 20 minutes, 2-3 times a day.  Keep all follow-up visits as told by your health care provider. This is important. Contact a  health care provider if:  Your pain is not controlled with medicine.  You have a fever.  You have more redness, swelling, or pain around the puncture site.  You have fluid or blood coming from the puncture site.  Your puncture site feels warm to the touch.  You have pus or a bad smell coming from the puncture site. Summary  After the procedure, it is common to have mild pain, tenderness, swelling, and bruising.  Follow instructions from your health care provider about how to take care of the puncture site and what activities are safe for you.  Take over-the-counter and prescription medicines only as told by your health care provider.  Contact a health care provider if you have any signs of infection, such as fluid or blood coming from the puncture site. This information is not intended to replace advice given to you by your health care provider. Make sure you discuss any questions you have with your health care provider. Document Revised: 11/01/2018 Document Reviewed: 11/01/2018 Elsevier Patient Education  2020 Elsevier [employer]. Moderate Conscious Sedation, Adult, Care After These instructions provide you with information about caring for yourself after your procedure. Your health care provider may also give you more specific instructions. Your treatment has been planned according to current medical practices, but problems sometimes occur. Call your health care provider if you have any problems or questions after your procedure. What can I expect after the procedure? After your procedure,   After your procedure, it is common:  To feel sleepy for several hours.  To feel clumsy and have poor balance for several hours.  To have poor judgment for several hours.  To vomit if you eat too soon. Follow these instructions at home: For at least 24 hours after the procedure:   Do not: ? Participate in activities where you could fall or become injured. ? Drive. ? Use heavy machinery. ? Drink  alcohol. ? Take sleeping pills or medicines that cause drowsiness. ? Make important decisions or sign legal documents. ? Take care of children on your own.  Rest. Eating and drinking  Follow the diet recommended by your health care provider.  If you vomit: ? Drink water, juice, or soup when you can drink without vomiting. ? Make sure you have little or no nausea before eating solid foods. General instructions  Have a responsible adult stay with you until you are awake and alert.  Take over-the-counter and prescription medicines only as told by your health care provider.  If you smoke, do not smoke without supervision.  Keep all follow-up visits as told by your health care provider. This is important. Contact a health care provider if:  You keep feeling nauseous or you keep vomiting.  You feel light-headed.  You develop a rash.  You have a fever. Get help right away if:  You have trouble breathing. This information is not intended to replace advice given to you by your health care provider. Make sure you discuss any questions you have with your health care provider. Document Revised: 05/28/2017 Document Reviewed: 10/05/2015 Elsevier Patient Education  2020 [employer].

## 2019-09-12 LAB — SURGICAL PATHOLOGY

## 2019-09-15 ENCOUNTER — Encounter (HOSPITAL_COMMUNITY)
Admission: RE | Admit: 2019-09-15 | Discharge: 2019-09-15 | Disposition: A | Discharge status: Home / Self Care | Payer: Medicare HMO | Source: Ambulatory Visit | Attending: Hematology | Admitting: Hematology

## 2019-09-15 ENCOUNTER — Other Ambulatory Visit: Payer: Self-pay

## 2019-09-15 ENCOUNTER — Inpatient Hospital Stay: Payer: Medicare HMO

## 2019-09-15 ENCOUNTER — Encounter (HOSPITAL_COMMUNITY): Payer: Self-pay | Admitting: Hematology

## 2019-09-15 DIAGNOSIS — I700 Atherosclerosis of aorta: Secondary | ICD-10-CM | POA: Insufficient documentation

## 2019-09-15 DIAGNOSIS — Z79899 Other long term (current) drug therapy: Secondary | ICD-10-CM | POA: Diagnosis not present

## 2019-09-15 DIAGNOSIS — K769 Liver disease, unspecified: Secondary | ICD-10-CM | POA: Diagnosis not present

## 2019-09-15 DIAGNOSIS — C9000 Multiple myeloma not having achieved remission: Secondary | ICD-10-CM | POA: Insufficient documentation

## 2019-09-15 LAB — CMP (CANCER CENTER ONLY)
ALT: 24 U/L (ref 0–44)
AST: 26 U/L (ref 15–41)
Albumin: 3.8 g/dL (ref 3.5–5.0)
Alkaline Phosphatase: 58 U/L (ref 38–126)
Anion gap: 7 (ref 5–15)
BUN: 17 mg/dL (ref 8–23)
CO2: 25 mmol/L (ref 22–32)
Calcium: 9.7 mg/dL (ref 8.9–10.3)
Chloride: 108 mmol/L (ref 98–111)
Creatinine: 0.84 mg/dL (ref 0.44–1.00)
GFR, Est AFR Am: >60 mL/min (ref >60)
GFR, Estimated: >60 mL/min (ref >60)
Glucose, Bld: 100 mg/dL — ABNORMAL HIGH (ref 70–99)
Potassium: 3.8 mmol/L (ref 3.5–5.1)
Sodium: 140 mmol/L (ref 135–145)
Total Bilirubin: 0.4 mg/dL (ref 0.3–1.2)
Total Protein: 10.2 g/dL — ABNORMAL HIGH (ref 6.5–8.1)

## 2019-09-15 LAB — CBC WITH DIFFERENTIAL/PLATELET
Abs Immature Granulocytes: 0 10*3/uL (ref 0.00–0.07)
Basophils Absolute: 0 10*3/uL (ref 0.0–0.1)
Basophils Relative: 1 %
Eosinophils Absolute: 0.1 10*3/uL (ref 0.0–0.5)
Eosinophils Relative: 2 %
HCT: 37.8 % (ref 36.0–46.0)
Hemoglobin: 12.7 g/dL (ref 12.0–15.0)
Immature Granulocytes: 0 %
Lymphocytes Relative: 47 %
Lymphs Abs: 1.5 10*3/uL (ref 0.7–4.0)
MCH: 34 pg (ref 26.0–34.0)
MCHC: 33.6 g/dL (ref 30.0–36.0)
MCV: 101.3 fL — ABNORMAL HIGH (ref 80.0–100.0)
Monocytes Absolute: 0.3 10*3/uL (ref 0.1–1.0)
Monocytes Relative: 11 %
Neutro Abs: 1.2 10*3/uL — ABNORMAL LOW (ref 1.7–7.7)
Neutrophils Relative %: 39 %
Platelets: 211 10*3/uL (ref 150–400)
RBC: 3.73 MIL/uL — ABNORMAL LOW (ref 3.87–5.11)
RDW: 13.8 % (ref 11.5–15.5)
WBC: 3.1 10*3/uL — ABNORMAL LOW (ref 4.0–10.5)
nRBC: 0 % (ref 0.0–0.2)

## 2019-09-15 LAB — LACTATE DEHYDROGENASE: LDH: 183 U/L (ref 98–192)

## 2019-09-15 LAB — GLUCOSE, CAPILLARY: Glucose-Capillary: 98 mg/dL (ref 70–99)

## 2019-09-15 MED ORDER — FLUDEOXYGLUCOSE F - 18 (FDG) INJECTION
7.5300 | Freq: Once | INTRAVENOUS | Status: AC | PRN
  Administered 2019-09-15: 7.53 via INTRAVENOUS

## 2019-09-16 LAB — BETA 2 MICROGLOBULIN, SERUM: Beta-2 Microglobulin: 2.2 mg/L (ref 0.6–2.4)

## 2019-09-18 ENCOUNTER — Telehealth: Payer: Self-pay | Admitting: Hematology and Oncology

## 2019-09-18 LAB — KAPPA/LAMBDA LIGHT CHAINS
Kappa free light chain: 28.2 mg/L — ABNORMAL HIGH (ref 3.3–19.4)
Kappa, lambda light chain ratio: 4.34 — ABNORMAL HIGH (ref 0.26–1.65)
Lambda free light chains: 6.5 mg/L (ref 5.7–26.3)

## 2019-09-18 NOTE — Telephone Encounter (Signed)
R/s per MD. Hulen Skains and spoke with pt, confirmed 4/5 appt

## 2019-09-19 ENCOUNTER — Encounter (HOSPITAL_COMMUNITY): Payer: Self-pay | Admitting: Hematology

## 2019-09-19 LAB — MULTIPLE MYELOMA PANEL, SERUM
Albumin SerPl Elph-Mcnc: 4.1 g/dL (ref 2.9–4.4)
Albumin/Glob SerPl: 0.8 (ref 0.7–1.7)
Alpha 1: 0.3 g/dL (ref 0.0–0.4)
Alpha2 Glob SerPl Elph-Mcnc: 0.9 g/dL (ref 0.4–1.0)
B-Globulin SerPl Elph-Mcnc: 1.1 g/dL (ref 0.7–1.3)
Gamma Glob SerPl Elph-Mcnc: 3.2 g/dL — ABNORMAL HIGH (ref 0.4–1.8)
Globulin, Total: 5.4 g/dL — ABNORMAL HIGH (ref 2.2–3.9)
IgA: 20 mg/dL — ABNORMAL LOW (ref 64–422)
IgG (Immunoglobin G), Serum: 4045 mg/dL — ABNORMAL HIGH (ref 586–1602)
IgM (Immunoglobulin M), Srm: 41 mg/dL (ref 26–217)
M Protein SerPl Elph-Mcnc: 3.1 g/dL — ABNORMAL HIGH
Total Protein ELP: 9.5 g/dL — ABNORMAL HIGH (ref 6.0–8.5)

## 2019-09-29 ENCOUNTER — Ambulatory Visit: Payer: Medicare HMO | Admitting: Hematology

## 2019-10-02 ENCOUNTER — Other Ambulatory Visit: Payer: Self-pay

## 2019-10-02 ENCOUNTER — Inpatient Hospital Stay: Payer: Medicare HMO | Attending: Hematology | Admitting: Hematology

## 2019-10-02 VITALS — BP 145/85 | HR 78 | Temp 98.5°F | Resp 18 | Ht 64.0 in | Wt 152.1 lb

## 2019-10-02 DIAGNOSIS — M8589 Other specified disorders of bone density and structure, multiple sites: Secondary | ICD-10-CM | POA: Diagnosis not present

## 2019-10-02 DIAGNOSIS — Z79899 Other long term (current) drug therapy: Secondary | ICD-10-CM | POA: Insufficient documentation

## 2019-10-02 DIAGNOSIS — C9000 Multiple myeloma not having achieved remission: Secondary | ICD-10-CM

## 2019-10-02 DIAGNOSIS — E785 Hyperlipidemia, unspecified: Secondary | ICD-10-CM | POA: Diagnosis not present

## 2019-10-02 DIAGNOSIS — F329 Major depressive disorder, single episode, unspecified: Secondary | ICD-10-CM | POA: Diagnosis not present

## 2019-10-02 DIAGNOSIS — R69 Illness, unspecified: Secondary | ICD-10-CM | POA: Diagnosis not present

## 2019-10-02 DIAGNOSIS — K589 Irritable bowel syndrome without diarrhea: Secondary | ICD-10-CM | POA: Insufficient documentation

## 2019-10-02 DIAGNOSIS — K219 Gastro-esophageal reflux disease without esophagitis: Secondary | ICD-10-CM | POA: Diagnosis not present

## 2019-10-02 DIAGNOSIS — I10 Essential (primary) hypertension: Secondary | ICD-10-CM | POA: Diagnosis not present

## 2019-10-02 DIAGNOSIS — D472 Monoclonal gammopathy: Secondary | ICD-10-CM
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
## 2019-10-06 ENCOUNTER — Telehealth: Payer: Self-pay | Admitting: Hematology

## 2019-10-06 NOTE — Telephone Encounter (Signed)
Scheduled per los, patient has been called and voicemail was left. 

## 2019-10-10 DIAGNOSIS — M25562 Pain in left knee: Secondary | ICD-10-CM | POA: Diagnosis not present

## 2019-10-19 ENCOUNTER — Other Ambulatory Visit: Payer: Self-pay | Admitting: Otolaryngology

## 2019-10-19 DIAGNOSIS — R490 Dysphonia: Secondary | ICD-10-CM | POA: Diagnosis not present

## 2019-10-20 ENCOUNTER — Inpatient Hospital Stay (HOSPITAL_COMMUNITY): Admission: RE | Admit: 2019-10-20 | Payer: Medicare HMO | Source: Ambulatory Visit

## 2019-10-20 NOTE — Progress Notes (Signed)
Lisa at Dr. Redmond Baseman office notified that I have no been able to reach the patient for pre op instructions or covid instructions.

## 2019-10-22 ENCOUNTER — Encounter (HOSPITAL_COMMUNITY): Payer: Self-pay | Admitting: Anesthesiology

## 2019-10-22 NOTE — Anesthesia Preprocedure Evaluation (Deleted)
Anesthesia Evaluation  Patient identified by MRN, date of birth, ID band Patient awake    Reviewed: Allergy & Precautions, NPO status , Patient's Chart, lab work & pertinent test results  Airway Mallampati: II  TM Distance: >3 FB Neck ROM: Full    Dental no notable dental hx. (+) Dental Advisory Given   Pulmonary neg pulmonary ROS,    Pulmonary exam normal breath sounds clear to auscultation       Cardiovascular hypertension, Pt. on medications Normal cardiovascular exam Rhythm:Regular Rate:Normal     Neuro/Psych negative neurological ROS  negative psych ROS   GI/Hepatic Neg liver ROS, GERD  Medicated and Controlled,  Endo/Other  negative endocrine ROS  Renal/GU negative Renal ROS  negative genitourinary   Musculoskeletal negative musculoskeletal ROS (+)   Abdominal Normal abdominal exam  (+)   Peds negative pediatric ROS (+)  Hematology negative hematology ROS (+)   Anesthesia Other Findings Foreign body sensation in throat, hx vocal cord cyst  Reproductive/Obstetrics negative OB ROS                            Anesthesia Physical Anesthesia Plan  ASA: III  Anesthesia Plan: General   Post-op Pain Management:    Induction: Intravenous  PONV Risk Score and Plan: 3 and Ondansetron and Treatment may vary due to age or medical condition  Airway Management Planned: Oral ETT  Additional Equipment: None  Intra-op Plan:   Post-operative Plan: Extubation in OR  Informed Consent: I have reviewed the patients History and Physical, chart, labs and discussed the procedure including the risks, benefits and alternatives for the proposed anesthesia with the patient or authorized representative who has indicated his/her understanding and acceptance.     Dental advisory given  Plan Discussed with: CRNA  Anesthesia Plan Comments:         Anesthesia Quick Evaluation

## 2019-10-23 ENCOUNTER — Ambulatory Visit (HOSPITAL_BASED_OUTPATIENT_CLINIC_OR_DEPARTMENT_OTHER): Admission: RE | Admit: 2019-10-23 | Payer: Medicare HMO | Source: Home / Self Care | Admitting: Otolaryngology

## 2019-10-23 ENCOUNTER — Encounter (HOSPITAL_BASED_OUTPATIENT_CLINIC_OR_DEPARTMENT_OTHER): Admission: RE | Payer: Self-pay | Source: Home / Self Care

## 2019-10-23 SURGERY — LARYNGOSCOPY, DIRECT
Anesthesia: General

## 2019-10-27 DIAGNOSIS — E663 Overweight: Secondary | ICD-10-CM | POA: Diagnosis not present

## 2019-10-27 DIAGNOSIS — I10 Essential (primary) hypertension: Secondary | ICD-10-CM | POA: Diagnosis not present

## 2019-10-27 DIAGNOSIS — Z823 Family history of stroke: Secondary | ICD-10-CM | POA: Diagnosis not present

## 2019-10-27 DIAGNOSIS — G4700 Insomnia, unspecified: Secondary | ICD-10-CM | POA: Diagnosis not present

## 2019-10-27 DIAGNOSIS — Z79899 Other long term (current) drug therapy: Secondary | ICD-10-CM | POA: Diagnosis not present

## 2019-10-27 DIAGNOSIS — Z7982 Long term (current) use of aspirin: Secondary | ICD-10-CM | POA: Diagnosis not present

## 2019-10-27 DIAGNOSIS — E785 Hyperlipidemia, unspecified: Secondary | ICD-10-CM | POA: Diagnosis not present

## 2019-10-27 DIAGNOSIS — H409 Unspecified glaucoma: Secondary | ICD-10-CM | POA: Diagnosis not present

## 2019-10-27 DIAGNOSIS — M810 Age-related osteoporosis without current pathological fracture: Secondary | ICD-10-CM | POA: Diagnosis not present

## 2019-10-27 DIAGNOSIS — Z6827 Body mass index (BMI) 27.0-27.9, adult: Secondary | ICD-10-CM | POA: Diagnosis not present

## 2019-10-27 DIAGNOSIS — Z008 Encounter for other general examination: Secondary | ICD-10-CM | POA: Diagnosis not present

## 2019-11-08 DIAGNOSIS — E559 Vitamin D deficiency, unspecified: Secondary | ICD-10-CM | POA: Diagnosis not present

## 2019-11-08 DIAGNOSIS — E785 Hyperlipidemia, unspecified: Secondary | ICD-10-CM | POA: Diagnosis not present

## 2019-11-08 DIAGNOSIS — R7303 Prediabetes: Secondary | ICD-10-CM | POA: Diagnosis not present

## 2019-11-08 DIAGNOSIS — R69 Illness, unspecified: Secondary | ICD-10-CM | POA: Diagnosis not present

## 2019-11-08 DIAGNOSIS — I10 Essential (primary) hypertension: Secondary | ICD-10-CM | POA: Diagnosis not present

## 2019-11-08 DIAGNOSIS — A084 Viral intestinal infection, unspecified: Secondary | ICD-10-CM | POA: Diagnosis not present

## 2019-11-08 DIAGNOSIS — C9000 Multiple myeloma not having achieved remission: Secondary | ICD-10-CM | POA: Diagnosis not present

## 2019-11-09 DIAGNOSIS — Z01818 Encounter for other preprocedural examination: Secondary | ICD-10-CM | POA: Diagnosis not present

## 2019-11-13 DIAGNOSIS — R0989 Other specified symptoms and signs involving the circulatory and respiratory systems: Secondary | ICD-10-CM | POA: Diagnosis not present

## 2019-11-13 DIAGNOSIS — T17228S Food in pharynx causing other injury, sequela: Secondary | ICD-10-CM | POA: Diagnosis not present

## 2019-11-28 ENCOUNTER — Other Ambulatory Visit: Payer: Self-pay

## 2019-11-28 ENCOUNTER — Inpatient Hospital Stay: Payer: Medicare HMO | Attending: Hematology

## 2019-11-28 DIAGNOSIS — C9000 Multiple myeloma not having achieved remission: Secondary | ICD-10-CM | POA: Insufficient documentation

## 2019-11-28 DIAGNOSIS — D72819 Decreased white blood cell count, unspecified: Secondary | ICD-10-CM | POA: Diagnosis not present

## 2019-11-28 DIAGNOSIS — I10 Essential (primary) hypertension: Secondary | ICD-10-CM | POA: Diagnosis not present

## 2019-11-28 DIAGNOSIS — E559 Vitamin D deficiency, unspecified: Secondary | ICD-10-CM | POA: Diagnosis not present

## 2019-11-28 DIAGNOSIS — Z79899 Other long term (current) drug therapy: Secondary | ICD-10-CM | POA: Diagnosis not present

## 2019-11-28 DIAGNOSIS — M8589 Other specified disorders of bone density and structure, multiple sites: Secondary | ICD-10-CM

## 2019-11-28 DIAGNOSIS — D472 Monoclonal gammopathy: Secondary | ICD-10-CM

## 2019-11-28 LAB — CBC WITH DIFFERENTIAL/PLATELET
Abs Immature Granulocytes: 0 10*3/uL (ref 0.00–0.07)
Basophils Absolute: 0 10*3/uL (ref 0.0–0.1)
Basophils Relative: 1 %
Eosinophils Absolute: 0 10*3/uL (ref 0.0–0.5)
Eosinophils Relative: 2 %
HCT: 34.9 % — ABNORMAL LOW (ref 36.0–46.0)
Hemoglobin: 11.7 g/dL — ABNORMAL LOW (ref 12.0–15.0)
Immature Granulocytes: 0 %
Lymphocytes Relative: 35 %
Lymphs Abs: 0.9 10*3/uL (ref 0.7–4.0)
MCH: 33.6 pg (ref 26.0–34.0)
MCHC: 33.5 g/dL (ref 30.0–36.0)
MCV: 100.3 fL — ABNORMAL HIGH (ref 80.0–100.0)
Monocytes Absolute: 0.4 10*3/uL (ref 0.1–1.0)
Monocytes Relative: 14 %
Neutro Abs: 1.2 10*3/uL — ABNORMAL LOW (ref 1.7–7.7)
Neutrophils Relative %: 48 %
Platelets: 182 10*3/uL (ref 150–400)
RBC: 3.48 MIL/uL — ABNORMAL LOW (ref 3.87–5.11)
RDW: 14.6 % (ref 11.5–15.5)
WBC: 2.5 10*3/uL — ABNORMAL LOW (ref 4.0–10.5)
nRBC: 0 % (ref 0.0–0.2)

## 2019-11-28 LAB — CMP (CANCER CENTER ONLY)
ALT: 26 U/L (ref 0–44)
AST: 26 U/L (ref 15–41)
Albumin: 3.6 g/dL (ref 3.5–5.0)
Alkaline Phosphatase: 51 U/L (ref 38–126)
Anion gap: 7 (ref 5–15)
BUN: 13 mg/dL (ref 8–23)
CO2: 27 mmol/L (ref 22–32)
Calcium: 10.1 mg/dL (ref 8.9–10.3)
Chloride: 106 mmol/L (ref 98–111)
Creatinine: 0.86 mg/dL (ref 0.44–1.00)
GFR, Est AFR Am: >60 mL/min (ref >60)
GFR, Estimated: >60 mL/min (ref >60)
Glucose, Bld: 94 mg/dL (ref 70–99)
Potassium: 3.9 mmol/L (ref 3.5–5.1)
Sodium: 140 mmol/L (ref 135–145)
Total Bilirubin: 0.4 mg/dL (ref 0.3–1.2)
Total Protein: 9.4 g/dL — ABNORMAL HIGH (ref 6.5–8.1)

## 2019-11-29 DIAGNOSIS — T17208A Unspecified foreign body in pharynx causing other injury, initial encounter: Secondary | ICD-10-CM | POA: Diagnosis not present

## 2019-11-29 LAB — KAPPA/LAMBDA LIGHT CHAINS
Kappa free light chain: 30.1 mg/L — ABNORMAL HIGH (ref 3.3–19.4)
Kappa, lambda light chain ratio: 4.36 — ABNORMAL HIGH (ref 0.26–1.65)
Lambda free light chains: 6.9 mg/L (ref 5.7–26.3)

## 2019-11-30 ENCOUNTER — Other Ambulatory Visit: Payer: Self-pay | Admitting: Family Medicine

## 2019-11-30 DIAGNOSIS — Z1231 Encounter for screening mammogram for malignant neoplasm of breast: Secondary | ICD-10-CM

## 2019-12-01 LAB — MULTIPLE MYELOMA PANEL, SERUM
Albumin SerPl Elph-Mcnc: 3.7 g/dL (ref 2.9–4.4)
Albumin/Glob SerPl: 0.7 (ref 0.7–1.7)
Alpha 1: 0.2 g/dL (ref 0.0–0.4)
Alpha2 Glob SerPl Elph-Mcnc: 0.8 g/dL (ref 0.4–1.0)
B-Globulin SerPl Elph-Mcnc: 1 g/dL (ref 0.7–1.3)
Gamma Glob SerPl Elph-Mcnc: 3.3 g/dL — ABNORMAL HIGH (ref 0.4–1.8)
Globulin, Total: 5.3 g/dL — ABNORMAL HIGH (ref 2.2–3.9)
IgA: 20 mg/dL — ABNORMAL LOW (ref 64–422)
IgG (Immunoglobin G), Serum: 3373 mg/dL — ABNORMAL HIGH (ref 586–1602)
IgM (Immunoglobulin M), Srm: 34 mg/dL (ref 26–217)
M Protein SerPl Elph-Mcnc: 3 g/dL — ABNORMAL HIGH
Total Protein ELP: 9 g/dL — ABNORMAL HIGH (ref 6.0–8.5)

## 2019-12-04 ENCOUNTER — Other Ambulatory Visit: Payer: Self-pay

## 2019-12-04 ENCOUNTER — Inpatient Hospital Stay: Payer: Medicare HMO | Admitting: Hematology

## 2019-12-04 ENCOUNTER — Other Ambulatory Visit: Payer: Medicare HMO

## 2019-12-04 VITALS — BP 131/75 | HR 68 | Temp 97.7°F | Resp 18 | Ht 64.0 in | Wt 149.6 lb

## 2019-12-04 DIAGNOSIS — D709 Neutropenia, unspecified: Secondary | ICD-10-CM | POA: Diagnosis not present

## 2019-12-04 DIAGNOSIS — C9000 Multiple myeloma not having achieved remission: Secondary | ICD-10-CM | POA: Diagnosis not present

## 2019-12-04 DIAGNOSIS — Z7189 Other specified counseling: Secondary | ICD-10-CM | POA: Diagnosis not present

## 2019-12-04 DIAGNOSIS — M8589 Other specified disorders of bone density and structure, multiple sites: Secondary | ICD-10-CM

## 2019-12-04 DIAGNOSIS — D472 Monoclonal gammopathy: Secondary | ICD-10-CM

## 2019-12-04 MED ORDER — DENOSUMAB 60 MG/ML ~~LOC~~ SOSY
PREFILLED_SYRINGE | SUBCUTANEOUS | Status: AC
  Filled 2019-12-04: qty 1

## 2019-12-04 MED ORDER — DENOSUMAB 60 MG/ML ~~LOC~~ SOSY: 60.0000 mg | PREFILLED_SYRINGE | Freq: Once | SUBCUTANEOUS | 0 refills | Status: AC

## 2019-12-04 MED ORDER — DENOSUMAB 60 MG/ML ~~LOC~~ SOSY
60.0000 mg | PREFILLED_SYRINGE | Freq: Once | SUBCUTANEOUS | Status: AC
  Administered 2019-12-04: 60 mg via SUBCUTANEOUS
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
## 2019-12-04 NOTE — Patient Instructions (Signed)
Denosumab injection What is this medicine? DENOSUMAB (den oh sue mab) slows bone breakdown. Prolia is used to treat osteoporosis in women after menopause and in men, and in people who are taking corticosteroids for 6 months or more. Xgeva is used to treat a high calcium level due to cancer and to prevent bone fractures and other bone problems caused by multiple myeloma or cancer bone metastases. Xgeva is also used to treat giant cell tumor of the bone. This medicine may be used for other purposes; ask your health care provider or pharmacist if you have questions. COMMON BRAND NAME(S): Prolia, XGEVA What should I tell my health care provider before I take this medicine? They need to know if you have any of these conditions:  dental disease  having surgery or tooth extraction  infection  kidney disease  low levels of calcium or Vitamin D in the blood  malnutrition  on hemodialysis  skin conditions or sensitivity  thyroid or parathyroid disease  an unusual reaction to denosumab, other medicines, foods, dyes, or preservatives  pregnant or trying to get pregnant  breast-feeding How should I use this medicine? This medicine is for injection under the skin. It is given by a health care professional in a hospital or clinic setting. A special MedGuide will be given to you before each treatment. Be sure to read this information carefully each time. For Prolia, talk to your pediatrician regarding the use of this medicine in children. Special care may be needed. For Xgeva, talk to your pediatrician regarding the use of this medicine in children. While this drug may be prescribed for children as young as 13 years for selected conditions, precautions do apply. Overdosage: If you think you have taken too much of this medicine contact a poison control center or emergency room at once. NOTE: This medicine is only for you. Do not share this medicine with others. What if I miss a dose? It is  important not to miss your dose. Call your doctor or health care professional if you are unable to keep an appointment. What may interact with this medicine? Do not take this medicine with any of the following medications:  other medicines containing denosumab This medicine may also interact with the following medications:  medicines that lower your chance of fighting infection  steroid medicines like prednisone or cortisone This list may not describe all possible interactions. Give your health care provider a list of all the medicines, herbs, non-prescription drugs, or dietary supplements you use. Also tell them if you smoke, drink alcohol, or use illegal drugs. Some items may interact with your medicine. What should I watch for while using this medicine? Visit your doctor or health care professional for regular checks on your progress. Your doctor or health care professional may order blood tests and other tests to see how you are doing. Call your doctor or health care professional for advice if you get a fever, chills or sore throat, or other symptoms of a cold or flu. Do not treat yourself. This drug may decrease your body's ability to fight infection. Try to avoid being around people who are sick. You should make sure you get enough calcium and vitamin D while you are taking this medicine, unless your doctor tells you not to. Discuss the foods you eat and the vitamins you take with your health care professional. See your dentist regularly. Brush and floss your teeth as directed. Before you have any dental work done, tell your dentist you are   receiving this medicine. Do not become pregnant while taking this medicine or for 5 months after stopping it. Talk with your doctor or health care professional about your birth control options while taking this medicine. Women should inform their doctor if they wish to become pregnant or think they might be pregnant. There is a potential for serious side  effects to an unborn child. Talk to your health care professional or pharmacist for more information. What side effects may I notice from receiving this medicine? Side effects that you should report to your doctor or health care professional as soon as possible:  allergic reactions like skin rash, itching or hives, swelling of the face, lips, or tongue  bone pain  breathing problems  dizziness  jaw pain, especially after dental work  redness, blistering, peeling of the skin  signs and symptoms of infection like fever or chills; cough; sore throat; pain or trouble passing urine  signs of low calcium like fast heartbeat, muscle cramps or muscle pain; pain, tingling, numbness in the hands or feet; seizures  unusual bleeding or bruising  unusually weak or tired Side effects that usually do not require medical attention (report to your doctor or health care professional if they continue or are bothersome):  constipation  diarrhea  headache  joint pain  loss of appetite  muscle pain  runny nose  tiredness  upset stomach This list may not describe all possible side effects. Call your doctor for medical advice about side effects. You may report side effects to FDA at 1-800-FDA-1088. Where should I keep my medicine? This medicine is only given in a clinic, doctor's office, or other health care setting and will not be stored at home. NOTE: This sheet is a summary. It may not cover all possible information. If you have questions about this medicine, talk to your doctor, pharmacist, or health care provider.  2020 Elsevier/Gold Standard (2017-10-22 16:10:44)

## 2019-12-04 NOTE — Progress Notes (Signed)
12/04/19  OK to give per PA team for today.  Prolia --  Henreitta Leber, PharmD

## 2019-12-05 ENCOUNTER — Ambulatory Visit: Payer: Medicare HMO

## 2019-12-08 DIAGNOSIS — R69 Illness, unspecified: Secondary | ICD-10-CM | POA: Diagnosis not present

## 2019-12-08 DIAGNOSIS — E1169 Type 2 diabetes mellitus with other specified complication: Secondary | ICD-10-CM | POA: Diagnosis not present

## 2019-12-28 ENCOUNTER — Telehealth: Payer: Self-pay | Admitting: *Deleted

## 2019-12-28 NOTE — Telephone Encounter (Signed)
Patient called to inform Dr. Irene Limbo that Dr. Dema Severin had diagnosed her with diabetes since her last appt with him. She is checking her blood glucose with finger sticks and monitor at home. No medication prescribed at this time. She will be attending nutrition classes in the next few weeks. Dr.Kale informed

## 2020-01-09 ENCOUNTER — Other Ambulatory Visit: Payer: Self-pay

## 2020-01-09 ENCOUNTER — Ambulatory Visit
Admission: RE | Admit: 2020-01-09 | Discharge: 2020-01-09 | Disposition: A | Discharge status: Home / Self Care | Payer: Medicare HMO | Source: Ambulatory Visit | Attending: Family Medicine | Admitting: Family Medicine

## 2020-01-09 DIAGNOSIS — Z1231 Encounter for screening mammogram for malignant neoplasm of breast: Secondary | ICD-10-CM | POA: Diagnosis not present

## 2020-01-10 ENCOUNTER — Other Ambulatory Visit: Payer: Self-pay

## 2020-01-10 ENCOUNTER — Encounter: Payer: Medicare HMO | Attending: Family Medicine | Admitting: Nutrition

## 2020-01-10 DIAGNOSIS — R7303 Prediabetes: Secondary | ICD-10-CM

## 2020-01-10 DIAGNOSIS — E1169 Type 2 diabetes mellitus with other specified complication: Secondary | ICD-10-CM | POA: Diagnosis not present

## 2020-01-10 DIAGNOSIS — Z713 Dietary counseling and surveillance: Secondary | ICD-10-CM | POA: Diagnosis not present

## 2020-01-11 NOTE — Patient Instructions (Signed)
Increase exercise to 10 min. Twice daily, and eventually work up to 10 min. 3X/day Make sure there is protein in cereal meal-nuts, 1 ounce cheese, to help with hunger and blood sugar control.  Test blood sugar before and 2hr. After supper meal and call results to me next week.  [phone]

## 2020-01-11 NOTE — Progress Notes (Signed)
Patient was identified by name and DOB.  She is here today to "review her diet and learn about how to take care of herself and her diabetes" Since seeing her doctor, she has reduced portion sizes of all foods, and stopped deserts.   SBGM:  She did not bring meter.  Memory says all readings are "very good" testst first morning and "after eating--1-2 hours"usually this is less than 130.   Exercise:  Stationary bike 10 min. 1-2X/wk. Typical day: 6AM wake  8:30AM tests blood sugar: today: 84.            Bfast:  1 egg, 1 piece of toast with sugar free jelly, 1 piece of turke bacon, or sausage, "few cherries" black coffee to drink.  Or Cherios with soy milk, and  A few cherries (3).  Tested blood sugar 2hr pc this bfast, and it was 109.   10:30:  3 Peanut butter crackers 2:30:  Sandwich-turkey, no chips, sugar free apple sauce, water to drink 5PM: few cherries (3) 7PM: supper: usually meatless with veg.that are steamed, cooked in small amount of butter with small amount of  Cheese:  Broccoli, squash, onions, cabbage. 4 Xwk will have fish, or chicken, baked. 9PM: 1 container of sugar free pudding Discussions: 1. Gave much praise for dietary changes.  Discussed idea of the need to have protein, carbs and fats, to assist with blood sugar control, and to not overdue with  amounts of each food group.  Sheet given with food groups in each group.  2.  Discussed importance of exercise to help insulin to work better.  Plan:  Increase to 10 min. 2X daily for next 2 weeks.  She agreed to do this. 3.  Discussed timing of blood sugar readings, and need to test 2hr. pc eating, for better idea of blood sugar control.  She agreed to test ac and 2hr. pc supper for 2 days and call me readings next week

## 2020-01-16 ENCOUNTER — Telehealth: Payer: Self-pay | Admitting: Nutrition

## 2020-01-16 NOTE — Telephone Encounter (Addendum)
Patient reports that Blood sugar Thursday fasting: 107, 2hr. PcB: 112,  2hr. PcS: 132 Sunday: FBS: 107,  AcS: 139,  Monday: FBS:106, 2hr.pcL: 137 Today: FBS 104,  2hr. PcB: 135. Patient appears to be following recommendations, and she was told that she is doing very well, and to continue her current eating habits.   She is using her stationary bike for 10 min. Twice daily.  She was encouraged to continue this as well.

## 2020-01-16 NOTE — Telephone Encounter (Deleted)
Patient also reports that she is using her stationary bike for 10 min. Twice daily.  She was given much praise and told to continue this.

## 2020-01-21 DIAGNOSIS — R69 Illness, unspecified: Secondary | ICD-10-CM | POA: Diagnosis not present

## 2020-01-22 DIAGNOSIS — R69 Illness, unspecified: Secondary | ICD-10-CM | POA: Diagnosis not present

## 2020-01-22 DIAGNOSIS — H401111 Primary open-angle glaucoma, right eye, mild stage: Secondary | ICD-10-CM | POA: Diagnosis not present

## 2020-01-22 DIAGNOSIS — H35372 Puckering of macula, left eye: Secondary | ICD-10-CM | POA: Diagnosis not present

## 2020-01-22 DIAGNOSIS — Z961 Presence of intraocular lens: Secondary | ICD-10-CM | POA: Diagnosis not present

## 2020-01-22 DIAGNOSIS — H401123 Primary open-angle glaucoma, left eye, severe stage: Secondary | ICD-10-CM | POA: Diagnosis not present

## 2020-01-30 ENCOUNTER — Telehealth: Payer: Self-pay | Admitting: Nutrition

## 2020-01-30 NOTE — Telephone Encounter (Signed)
Message left on machine.  "My blood sugars are doing great!".  Not drinking any sweet drinks, exercising 10 min. Twice daily Blood sugars:  acB Saturday: 106, 2hr. PcB: 129, Sunday: acL 107, 2hr. PcL: 122,  Monday, acS: 119, 2hr. PcS: 108, HS: 101, FBS today: 101, and 2hr. PcB: 118.   Called her back and told her that she is doing great!.  Continue exercising and stopping all sweet drinks, and make sure there is protein at each meal.  She voiced understanding of this. Patient requested this message be sent to Dr. Harlan Stains.

## 2020-02-08 DIAGNOSIS — K625 Hemorrhage of anus and rectum: Secondary | ICD-10-CM | POA: Diagnosis not present

## 2020-02-08 DIAGNOSIS — J301 Allergic rhinitis due to pollen: Secondary | ICD-10-CM | POA: Diagnosis not present

## 2020-02-08 DIAGNOSIS — K649 Unspecified hemorrhoids: Secondary | ICD-10-CM | POA: Diagnosis not present

## 2020-02-19 DIAGNOSIS — K099 Cyst of oral region, unspecified: Secondary | ICD-10-CM | POA: Diagnosis not present

## 2020-02-27 DIAGNOSIS — R69 Illness, unspecified: Secondary | ICD-10-CM | POA: Diagnosis not present

## 2020-03-05 DIAGNOSIS — R69 Illness, unspecified: Secondary | ICD-10-CM | POA: Diagnosis not present

## 2020-03-06 ENCOUNTER — Inpatient Hospital Stay: Payer: Medicare HMO | Attending: Hematology

## 2020-03-06 ENCOUNTER — Other Ambulatory Visit: Payer: Self-pay

## 2020-03-06 DIAGNOSIS — D709 Neutropenia, unspecified: Secondary | ICD-10-CM

## 2020-03-06 DIAGNOSIS — D72819 Decreased white blood cell count, unspecified: Secondary | ICD-10-CM | POA: Insufficient documentation

## 2020-03-06 DIAGNOSIS — D472 Monoclonal gammopathy: Secondary | ICD-10-CM

## 2020-03-06 DIAGNOSIS — Z79899 Other long term (current) drug therapy: Secondary | ICD-10-CM | POA: Insufficient documentation

## 2020-03-06 DIAGNOSIS — I10 Essential (primary) hypertension: Secondary | ICD-10-CM | POA: Diagnosis not present

## 2020-03-06 DIAGNOSIS — M8589 Other specified disorders of bone density and structure, multiple sites: Secondary | ICD-10-CM

## 2020-03-06 DIAGNOSIS — C9000 Multiple myeloma not having achieved remission: Secondary | ICD-10-CM | POA: Insufficient documentation

## 2020-03-06 LAB — CMP (CANCER CENTER ONLY)
ALT: 27 U/L (ref 0–44)
AST: 27 U/L (ref 15–41)
Albumin: 3.6 g/dL (ref 3.5–5.0)
Alkaline Phosphatase: 39 U/L (ref 38–126)
Anion gap: 5 (ref 5–15)
BUN: 16 mg/dL (ref 8–23)
CO2: 25 mmol/L (ref 22–32)
Calcium: 10 mg/dL (ref 8.9–10.3)
Chloride: 106 mmol/L (ref 98–111)
Creatinine: 0.75 mg/dL (ref 0.44–1.00)
GFR, Est AFR Am: >60 mL/min (ref >60)
GFR, Estimated: >60 mL/min (ref >60)
Glucose, Bld: 101 mg/dL — ABNORMAL HIGH (ref 70–99)
Potassium: 3.6 mmol/L (ref 3.5–5.1)
Sodium: 136 mmol/L (ref 135–145)
Total Bilirubin: 0.3 mg/dL (ref 0.3–1.2)
Total Protein: 10 g/dL — ABNORMAL HIGH (ref 6.5–8.1)

## 2020-03-06 LAB — CBC WITH DIFFERENTIAL/PLATELET
Abs Immature Granulocytes: 0 10*3/uL (ref 0.00–0.07)
Basophils Absolute: 0 10*3/uL (ref 0.0–0.1)
Basophils Relative: 1 %
Eosinophils Absolute: 0.1 10*3/uL (ref 0.0–0.5)
Eosinophils Relative: 2 %
HCT: 35.2 % — ABNORMAL LOW (ref 36.0–46.0)
Hemoglobin: 12 g/dL (ref 12.0–15.0)
Immature Granulocytes: 0 %
Lymphocytes Relative: 39 %
Lymphs Abs: 1.3 10*3/uL (ref 0.7–4.0)
MCH: 34.3 pg — ABNORMAL HIGH (ref 26.0–34.0)
MCHC: 34.1 g/dL (ref 30.0–36.0)
MCV: 100.6 fL — ABNORMAL HIGH (ref 80.0–100.0)
Monocytes Absolute: 0.4 10*3/uL (ref 0.1–1.0)
Monocytes Relative: 12 %
Neutro Abs: 1.5 10*3/uL — ABNORMAL LOW (ref 1.7–7.7)
Neutrophils Relative %: 46 %
Platelets: 181 10*3/uL (ref 150–400)
RBC: 3.5 MIL/uL — ABNORMAL LOW (ref 3.87–5.11)
RDW: 13.8 % (ref 11.5–15.5)
WBC: 3.3 10*3/uL — ABNORMAL LOW (ref 4.0–10.5)
nRBC: 0 % (ref 0.0–0.2)

## 2020-03-06 LAB — VITAMIN D 25 HYDROXY (VIT D DEFICIENCY, FRACTURES): Vit D, 25-Hydroxy: 54.43 ng/mL (ref 30–100)

## 2020-03-07 LAB — KAPPA/LAMBDA LIGHT CHAINS
Kappa free light chain: 27.5 mg/L — ABNORMAL HIGH (ref 3.3–19.4)
Kappa, lambda light chain ratio: 5.85 — ABNORMAL HIGH (ref 0.26–1.65)
Lambda free light chains: 4.7 mg/L — ABNORMAL LOW (ref 5.7–26.3)

## 2020-03-08 LAB — MULTIPLE MYELOMA PANEL, SERUM
Albumin SerPl Elph-Mcnc: 4 g/dL (ref 2.9–4.4)
Albumin/Glob SerPl: 0.8 (ref 0.7–1.7)
Alpha 1: 0.3 g/dL (ref 0.0–0.4)
Alpha2 Glob SerPl Elph-Mcnc: 0.8 g/dL (ref 0.4–1.0)
B-Globulin SerPl Elph-Mcnc: 1 g/dL (ref 0.7–1.3)
Gamma Glob SerPl Elph-Mcnc: 3.2 g/dL — ABNORMAL HIGH (ref 0.4–1.8)
Globulin, Total: 5.3 g/dL — ABNORMAL HIGH (ref 2.2–3.9)
IgA: 19 mg/dL — ABNORMAL LOW (ref 64–422)
IgG (Immunoglobin G), Serum: 3643 mg/dL — ABNORMAL HIGH (ref 586–1602)
IgM (Immunoglobulin M), Srm: 30 mg/dL (ref 26–217)
M Protein SerPl Elph-Mcnc: 2.6 g/dL — ABNORMAL HIGH
Total Protein ELP: 9.3 g/dL — ABNORMAL HIGH (ref 6.0–8.5)

## 2020-03-09 DIAGNOSIS — R69 Illness, unspecified: Secondary | ICD-10-CM | POA: Diagnosis not present

## 2020-03-11 DIAGNOSIS — R69 Illness, unspecified: Secondary | ICD-10-CM | POA: Diagnosis not present

## 2020-03-13 ENCOUNTER — Inpatient Hospital Stay (HOSPITAL_BASED_OUTPATIENT_CLINIC_OR_DEPARTMENT_OTHER): Payer: Medicare HMO | Admitting: Hematology

## 2020-03-13 ENCOUNTER — Other Ambulatory Visit: Payer: Self-pay

## 2020-03-13 VITALS — BP 135/94 | HR 78 | Temp 97.9°F | Resp 18 | Ht 64.0 in | Wt 141.5 lb

## 2020-03-13 DIAGNOSIS — C9000 Multiple myeloma not having achieved remission: Secondary | ICD-10-CM | POA: Diagnosis not present
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
## 2020-03-14 DIAGNOSIS — I10 Essential (primary) hypertension: Secondary | ICD-10-CM | POA: Diagnosis not present

## 2020-03-14 DIAGNOSIS — E1169 Type 2 diabetes mellitus with other specified complication: Secondary | ICD-10-CM | POA: Diagnosis not present

## 2020-03-14 DIAGNOSIS — E785 Hyperlipidemia, unspecified: Secondary | ICD-10-CM | POA: Diagnosis not present

## 2020-03-18 DIAGNOSIS — R69 Illness, unspecified: Secondary | ICD-10-CM | POA: Diagnosis not present

## 2020-04-09 ENCOUNTER — Telehealth: Payer: Self-pay | Admitting: Hematology

## 2020-04-09 NOTE — Telephone Encounter (Signed)
Rescheduled injectione appointment per 10/12 scheduling message and patient request. Spoke to patient who is aware of appointment time.

## 2020-04-16 DIAGNOSIS — R69 Illness, unspecified: Secondary | ICD-10-CM | POA: Diagnosis not present

## 2020-04-21 DIAGNOSIS — R69 Illness, unspecified: Secondary | ICD-10-CM | POA: Diagnosis not present

## 2020-04-24 DIAGNOSIS — G5601 Carpal tunnel syndrome, right upper limb: Secondary | ICD-10-CM | POA: Diagnosis not present

## 2020-04-24 DIAGNOSIS — E1169 Type 2 diabetes mellitus with other specified complication: Secondary | ICD-10-CM | POA: Diagnosis not present

## 2020-04-25 DIAGNOSIS — R69 Illness, unspecified: Secondary | ICD-10-CM | POA: Diagnosis not present

## 2020-05-17 DIAGNOSIS — Z0000 Encounter for general adult medical examination without abnormal findings: Secondary | ICD-10-CM | POA: Diagnosis not present

## 2020-05-17 DIAGNOSIS — E1169 Type 2 diabetes mellitus with other specified complication: Secondary | ICD-10-CM | POA: Diagnosis not present

## 2020-05-17 DIAGNOSIS — E785 Hyperlipidemia, unspecified: Secondary | ICD-10-CM | POA: Diagnosis not present

## 2020-05-17 DIAGNOSIS — E559 Vitamin D deficiency, unspecified: Secondary | ICD-10-CM | POA: Diagnosis not present

## 2020-05-17 DIAGNOSIS — I10 Essential (primary) hypertension: Secondary | ICD-10-CM | POA: Diagnosis not present

## 2020-05-17 DIAGNOSIS — C9000 Multiple myeloma not having achieved remission: Secondary | ICD-10-CM | POA: Diagnosis not present

## 2020-05-17 DIAGNOSIS — Z1159 Encounter for screening for other viral diseases: Secondary | ICD-10-CM | POA: Diagnosis not present

## 2020-05-17 DIAGNOSIS — D709 Neutropenia, unspecified: Secondary | ICD-10-CM | POA: Diagnosis not present

## 2020-05-17 DIAGNOSIS — R69 Illness, unspecified: Secondary | ICD-10-CM | POA: Diagnosis not present

## 2020-05-21 DIAGNOSIS — M25562 Pain in left knee: Secondary | ICD-10-CM | POA: Diagnosis not present

## 2020-05-21 DIAGNOSIS — M79642 Pain in left hand: Secondary | ICD-10-CM | POA: Diagnosis not present

## 2020-05-21 DIAGNOSIS — S8002XA Contusion of left knee, initial encounter: Secondary | ICD-10-CM | POA: Diagnosis not present

## 2020-05-21 DIAGNOSIS — S62317A Displaced fracture of base of fifth metacarpal bone. left hand, initial encounter for closed fracture: Secondary | ICD-10-CM | POA: Diagnosis not present

## 2020-05-30 DIAGNOSIS — M1712 Unilateral primary osteoarthritis, left knee: Secondary | ICD-10-CM | POA: Diagnosis not present

## 2020-05-30 DIAGNOSIS — S62317A Displaced fracture of base of fifth metacarpal bone. left hand, initial encounter for closed fracture: Secondary | ICD-10-CM | POA: Diagnosis not present

## 2020-05-30 DIAGNOSIS — M25562 Pain in left knee: Secondary | ICD-10-CM | POA: Diagnosis not present

## 2020-06-04 ENCOUNTER — Ambulatory Visit: Payer: Medicare HMO

## 2020-06-12 ENCOUNTER — Inpatient Hospital Stay: Payer: Medicare HMO | Attending: Hematology

## 2020-06-12 ENCOUNTER — Inpatient Hospital Stay: Payer: Medicare HMO

## 2020-06-12 ENCOUNTER — Other Ambulatory Visit: Payer: Self-pay

## 2020-06-12 VITALS — BP 156/93 | HR 62 | Resp 18

## 2020-06-12 DIAGNOSIS — M8580 Other specified disorders of bone density and structure, unspecified site: Secondary | ICD-10-CM | POA: Diagnosis not present

## 2020-06-12 DIAGNOSIS — D709 Neutropenia, unspecified: Secondary | ICD-10-CM | POA: Insufficient documentation

## 2020-06-12 DIAGNOSIS — C9000 Multiple myeloma not having achieved remission: Secondary | ICD-10-CM | POA: Diagnosis not present

## 2020-06-12 DIAGNOSIS — Z79899 Other long term (current) drug therapy: Secondary | ICD-10-CM | POA: Diagnosis not present

## 2020-06-12 DIAGNOSIS — I10 Essential (primary) hypertension: Secondary | ICD-10-CM | POA: Diagnosis not present

## 2020-06-12 DIAGNOSIS — E559 Vitamin D deficiency, unspecified: Secondary | ICD-10-CM | POA: Insufficient documentation

## 2020-06-12 DIAGNOSIS — Z7189 Other specified counseling: Secondary | ICD-10-CM

## 2020-06-12 DIAGNOSIS — D472 Monoclonal gammopathy: Secondary | ICD-10-CM

## 2020-06-12 DIAGNOSIS — M8589 Other specified disorders of bone density and structure, multiple sites: Secondary | ICD-10-CM

## 2020-06-12 LAB — CBC WITH DIFFERENTIAL/PLATELET
Abs Immature Granulocytes: 0 10*3/uL (ref 0.00–0.07)
Basophils Absolute: 0 10*3/uL (ref 0.0–0.1)
Basophils Relative: 1 %
Eosinophils Absolute: 0.1 10*3/uL (ref 0.0–0.5)
Eosinophils Relative: 2 %
HCT: 35.1 % — ABNORMAL LOW (ref 36.0–46.0)
Hemoglobin: 11.8 g/dL — ABNORMAL LOW (ref 12.0–15.0)
Immature Granulocytes: 0 %
Lymphocytes Relative: 47 %
Lymphs Abs: 1.5 10*3/uL (ref 0.7–4.0)
MCH: 34.1 pg — ABNORMAL HIGH (ref 26.0–34.0)
MCHC: 33.6 g/dL (ref 30.0–36.0)
MCV: 101.4 fL — ABNORMAL HIGH (ref 80.0–100.0)
Monocytes Absolute: 0.4 10*3/uL (ref 0.1–1.0)
Monocytes Relative: 13 %
Neutro Abs: 1.2 10*3/uL — ABNORMAL LOW (ref 1.7–7.7)
Neutrophils Relative %: 37 %
Platelets: 172 10*3/uL (ref 150–400)
RBC: 3.46 MIL/uL — ABNORMAL LOW (ref 3.87–5.11)
RDW: 14 % (ref 11.5–15.5)
WBC: 3.2 10*3/uL — ABNORMAL LOW (ref 4.0–10.5)
nRBC: 0 % (ref 0.0–0.2)

## 2020-06-12 LAB — CMP (CANCER CENTER ONLY)
ALT: 31 U/L (ref 0–44)
AST: 29 U/L (ref 15–41)
Albumin: 3.5 g/dL (ref 3.5–5.0)
Alkaline Phosphatase: 63 U/L (ref 38–126)
Anion gap: 5 (ref 5–15)
BUN: 15 mg/dL (ref 8–23)
CO2: 26 mmol/L (ref 22–32)
Calcium: 9.5 mg/dL (ref 8.9–10.3)
Chloride: 108 mmol/L (ref 98–111)
Creatinine: 0.91 mg/dL (ref 0.44–1.00)
GFR, Estimated: >60 mL/min (ref >60)
Glucose, Bld: 98 mg/dL (ref 70–99)
Potassium: 3.9 mmol/L (ref 3.5–5.1)
Sodium: 139 mmol/L (ref 135–145)
Total Bilirubin: 0.5 mg/dL (ref 0.3–1.2)
Total Protein: 10.3 g/dL — ABNORMAL HIGH (ref 6.5–8.1)

## 2020-06-12 MED ORDER — DENOSUMAB 60 MG/ML ~~LOC~~ SOSY
PREFILLED_SYRINGE | SUBCUTANEOUS | Status: AC
  Filled 2020-06-12: qty 1

## 2020-06-12 MED ORDER — DENOSUMAB 60 MG/ML ~~LOC~~ SOSY
60.0000 mg | PREFILLED_SYRINGE | Freq: Once | SUBCUTANEOUS | Status: AC
Start: 2020-06-12 — End: 2020-06-12
  Administered 2020-06-12: 60 mg via SUBCUTANEOUS

## 2020-06-12 NOTE — Progress Notes (Signed)
Due to Chemistry machine being down, results are not in, but per Dr.Kale, OK to give Prolia injection

## 2020-06-13 DIAGNOSIS — S62317A Displaced fracture of base of fifth metacarpal bone. left hand, initial encounter for closed fracture: Secondary | ICD-10-CM | POA: Diagnosis not present

## 2020-06-13 LAB — MULTIPLE MYELOMA PANEL, SERUM
Albumin SerPl Elph-Mcnc: 3.9 g/dL (ref 2.9–4.4)
Albumin/Glob SerPl: 0.8 (ref 0.7–1.7)
Alpha 1: 0.2 g/dL (ref 0.0–0.4)
Alpha2 Glob SerPl Elph-Mcnc: 0.8 g/dL (ref 0.4–1.0)
B-Globulin SerPl Elph-Mcnc: 1 g/dL (ref 0.7–1.3)
Gamma Glob SerPl Elph-Mcnc: 3.2 g/dL — ABNORMAL HIGH (ref 0.4–1.8)
Globulin, Total: 5.2 g/dL — ABNORMAL HIGH (ref 2.2–3.9)
IgA: 19 mg/dL — ABNORMAL LOW (ref 64–422)
IgG (Immunoglobin G), Serum: 4265 mg/dL — ABNORMAL HIGH (ref 586–1602)
IgM (Immunoglobulin M), Srm: 32 mg/dL (ref 26–217)
M Protein SerPl Elph-Mcnc: 3 g/dL — ABNORMAL HIGH
Total Protein ELP: 9.1 g/dL — ABNORMAL HIGH (ref 6.0–8.5)

## 2020-06-19 ENCOUNTER — Inpatient Hospital Stay (HOSPITAL_BASED_OUTPATIENT_CLINIC_OR_DEPARTMENT_OTHER): Payer: Medicare HMO | Admitting: Hematology

## 2020-06-19 ENCOUNTER — Other Ambulatory Visit: Payer: Self-pay

## 2020-06-19 VITALS — BP 144/79 | HR 79 | Temp 98.4°F | Resp 18 | Ht 64.0 in | Wt 149.7 lb

## 2020-06-19 DIAGNOSIS — D709 Neutropenia, unspecified: Secondary | ICD-10-CM

## 2020-06-19 DIAGNOSIS — D472 Monoclonal gammopathy: Secondary | ICD-10-CM

## 2020-06-19 DIAGNOSIS — C9000 Multiple myeloma not having achieved remission: Secondary | ICD-10-CM
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
## 2020-07-04 DIAGNOSIS — S62317A Displaced fracture of base of fifth metacarpal bone. left hand, initial encounter for closed fracture: Secondary | ICD-10-CM | POA: Diagnosis not present

## 2020-07-18 DIAGNOSIS — S62317A Displaced fracture of base of fifth metacarpal bone. left hand, initial encounter for closed fracture: Secondary | ICD-10-CM | POA: Diagnosis not present

## 2020-07-18 DIAGNOSIS — M19041 Primary osteoarthritis, right hand: Secondary | ICD-10-CM | POA: Diagnosis not present

## 2020-07-18 DIAGNOSIS — M19042 Primary osteoarthritis, left hand: Secondary | ICD-10-CM | POA: Diagnosis not present

## 2020-07-23 DIAGNOSIS — Z961 Presence of intraocular lens: Secondary | ICD-10-CM | POA: Diagnosis not present

## 2020-07-23 DIAGNOSIS — H401111 Primary open-angle glaucoma, right eye, mild stage: Secondary | ICD-10-CM | POA: Diagnosis not present

## 2020-07-23 DIAGNOSIS — H35372 Puckering of macula, left eye: Secondary | ICD-10-CM | POA: Diagnosis not present

## 2020-07-23 DIAGNOSIS — H401123 Primary open-angle glaucoma, left eye, severe stage: Secondary | ICD-10-CM | POA: Diagnosis not present

## 2020-07-29 ENCOUNTER — Telehealth: Payer: Self-pay

## 2020-07-29 NOTE — Telephone Encounter (Signed)
Received call from patient to clarify her PET scan and Lab appointments on Wednesday 07/31/20

## 2020-07-31 ENCOUNTER — Inpatient Hospital Stay: Payer: Medicare HMO | Attending: Hematology

## 2020-07-31 ENCOUNTER — Other Ambulatory Visit: Payer: Self-pay

## 2020-07-31 ENCOUNTER — Ambulatory Visit (HOSPITAL_COMMUNITY)
Admission: RE | Admit: 2020-07-31 | Discharge: 2020-07-31 | Disposition: A | Discharge status: Home / Self Care | Payer: Medicare HMO | Source: Ambulatory Visit | Attending: Hematology | Admitting: Hematology

## 2020-07-31 DIAGNOSIS — K589 Irritable bowel syndrome without diarrhea: Secondary | ICD-10-CM | POA: Insufficient documentation

## 2020-07-31 DIAGNOSIS — I700 Atherosclerosis of aorta: Secondary | ICD-10-CM | POA: Insufficient documentation

## 2020-07-31 DIAGNOSIS — D709 Neutropenia, unspecified: Secondary | ICD-10-CM | POA: Diagnosis not present

## 2020-07-31 DIAGNOSIS — D472 Monoclonal gammopathy: Secondary | ICD-10-CM

## 2020-07-31 DIAGNOSIS — Z79899 Other long term (current) drug therapy: Secondary | ICD-10-CM | POA: Insufficient documentation

## 2020-07-31 DIAGNOSIS — F32A Depression, unspecified: Secondary | ICD-10-CM | POA: Insufficient documentation

## 2020-07-31 DIAGNOSIS — E559 Vitamin D deficiency, unspecified: Secondary | ICD-10-CM | POA: Diagnosis not present

## 2020-07-31 DIAGNOSIS — I10 Essential (primary) hypertension: Secondary | ICD-10-CM | POA: Diagnosis not present

## 2020-07-31 DIAGNOSIS — K219 Gastro-esophageal reflux disease without esophagitis: Secondary | ICD-10-CM | POA: Insufficient documentation

## 2020-07-31 DIAGNOSIS — C9000 Multiple myeloma not having achieved remission: Secondary | ICD-10-CM | POA: Insufficient documentation

## 2020-07-31 DIAGNOSIS — E785 Hyperlipidemia, unspecified: Secondary | ICD-10-CM | POA: Diagnosis not present

## 2020-07-31 DIAGNOSIS — I2510 Atherosclerotic heart disease of native coronary artery without angina pectoris: Secondary | ICD-10-CM | POA: Diagnosis not present

## 2020-07-31 DIAGNOSIS — R69 Illness, unspecified: Secondary | ICD-10-CM | POA: Diagnosis not present

## 2020-07-31 LAB — CMP (CANCER CENTER ONLY)
ALT: 21 U/L (ref 0–44)
AST: 26 U/L (ref 15–41)
Albumin: 3.8 g/dL (ref 3.5–5.0)
Alkaline Phosphatase: 48 U/L (ref 38–126)
Anion gap: 3 — ABNORMAL LOW (ref 5–15)
BUN: 12 mg/dL (ref 8–23)
CO2: 26 mmol/L (ref 22–32)
Calcium: 8.9 mg/dL (ref 8.9–10.3)
Chloride: 109 mmol/L (ref 98–111)
Creatinine: 0.77 mg/dL (ref 0.44–1.00)
GFR, Estimated: >60 mL/min (ref >60)
Glucose, Bld: 102 mg/dL — ABNORMAL HIGH (ref 70–99)
Potassium: 3.9 mmol/L (ref 3.5–5.1)
Sodium: 138 mmol/L (ref 135–145)
Total Bilirubin: 0.4 mg/dL (ref 0.3–1.2)
Total Protein: 10.7 g/dL — ABNORMAL HIGH (ref 6.5–8.1)

## 2020-07-31 LAB — CBC WITH DIFFERENTIAL/PLATELET
Abs Immature Granulocytes: 0 10*3/uL (ref 0.00–0.07)
Basophils Absolute: 0 10*3/uL (ref 0.0–0.1)
Basophils Relative: 1 %
Eosinophils Absolute: 0 10*3/uL (ref 0.0–0.5)
Eosinophils Relative: 1 %
HCT: 35.7 % — ABNORMAL LOW (ref 36.0–46.0)
Hemoglobin: 11.8 g/dL — ABNORMAL LOW (ref 12.0–15.0)
Immature Granulocytes: 0 %
Lymphocytes Relative: 53 %
Lymphs Abs: 1.9 10*3/uL (ref 0.7–4.0)
MCH: 34.1 pg — ABNORMAL HIGH (ref 26.0–34.0)
MCHC: 33.1 g/dL (ref 30.0–36.0)
MCV: 103.2 fL — ABNORMAL HIGH (ref 80.0–100.0)
Monocytes Absolute: 0.3 10*3/uL (ref 0.1–1.0)
Monocytes Relative: 10 %
Neutro Abs: 1.2 10*3/uL — ABNORMAL LOW (ref 1.7–7.7)
Neutrophils Relative %: 35 %
Platelets: 176 10*3/uL (ref 150–400)
RBC: 3.46 MIL/uL — ABNORMAL LOW (ref 3.87–5.11)
RDW: 14.2 % (ref 11.5–15.5)
WBC: 3.5 10*3/uL — ABNORMAL LOW (ref 4.0–10.5)
nRBC: 0 % (ref 0.0–0.2)

## 2020-07-31 LAB — GLUCOSE, CAPILLARY: Glucose-Capillary: 95 mg/dL (ref 70–99)

## 2020-07-31 LAB — LACTATE DEHYDROGENASE: LDH: 193 U/L — ABNORMAL HIGH (ref 98–192)

## 2020-07-31 MED ORDER — FLUDEOXYGLUCOSE F - 18 (FDG) INJECTION
7.2000 | Freq: Once | INTRAVENOUS | Status: AC | PRN
  Administered 2020-07-31: 7.2 via INTRAVENOUS

## 2020-08-01 ENCOUNTER — Other Ambulatory Visit: Payer: Self-pay | Admitting: Student

## 2020-08-01 LAB — BETA 2 MICROGLOBULIN, SERUM: Beta-2 Microglobulin: 1.5 mg/L (ref 0.6–2.4)

## 2020-08-02 ENCOUNTER — Ambulatory Visit (HOSPITAL_COMMUNITY)
Admission: RE | Admit: 2020-08-02 | Discharge: 2020-08-02 | Disposition: A | Discharge status: Home / Self Care | Payer: Medicare HMO | Source: Ambulatory Visit | Attending: Hematology | Admitting: Hematology

## 2020-08-02 ENCOUNTER — Other Ambulatory Visit: Payer: Self-pay

## 2020-08-02 ENCOUNTER — Encounter (HOSPITAL_COMMUNITY): Payer: Self-pay

## 2020-08-02 DIAGNOSIS — D539 Nutritional anemia, unspecified: Secondary | ICD-10-CM | POA: Insufficient documentation

## 2020-08-02 DIAGNOSIS — D72819 Decreased white blood cell count, unspecified: Secondary | ICD-10-CM | POA: Insufficient documentation

## 2020-08-02 DIAGNOSIS — C9000 Multiple myeloma not having achieved remission: Secondary | ICD-10-CM | POA: Insufficient documentation

## 2020-08-02 DIAGNOSIS — Z79899 Other long term (current) drug therapy: Secondary | ICD-10-CM | POA: Insufficient documentation

## 2020-08-02 DIAGNOSIS — D72818 Other decreased white blood cell count: Secondary | ICD-10-CM | POA: Diagnosis not present

## 2020-08-02 DIAGNOSIS — D472 Monoclonal gammopathy: Secondary | ICD-10-CM

## 2020-08-02 DIAGNOSIS — D47Z9 Other specified neoplasms of uncertain behavior of lymphoid, hematopoietic and related tissue: Secondary | ICD-10-CM | POA: Diagnosis not present

## 2020-08-02 HISTORY — DX: Type 2 diabetes mellitus without complications: E11.9

## 2020-08-02 LAB — CBC WITH DIFFERENTIAL/PLATELET
Abs Immature Granulocytes: 0 10*3/uL (ref 0.00–0.07)
Basophils Absolute: 0 10*3/uL (ref 0.0–0.1)
Basophils Relative: 2 %
Eosinophils Absolute: 0.1 10*3/uL (ref 0.0–0.5)
Eosinophils Relative: 2 %
HCT: 33.3 % — ABNORMAL LOW (ref 36.0–46.0)
Hemoglobin: 11.4 g/dL — ABNORMAL LOW (ref 12.0–15.0)
Immature Granulocytes: 0 %
Lymphocytes Relative: 41 %
Lymphs Abs: 1 10*3/uL (ref 0.7–4.0)
MCH: 34.7 pg — ABNORMAL HIGH (ref 26.0–34.0)
MCHC: 34.2 g/dL (ref 30.0–36.0)
MCV: 101.2 fL — ABNORMAL HIGH (ref 80.0–100.0)
Monocytes Absolute: 0.3 10*3/uL (ref 0.1–1.0)
Monocytes Relative: 13 %
Neutro Abs: 1.1 10*3/uL — ABNORMAL LOW (ref 1.7–7.7)
Neutrophils Relative %: 42 %
Platelets: 170 10*3/uL (ref 150–400)
RBC: 3.29 MIL/uL — ABNORMAL LOW (ref 3.87–5.11)
RDW: 14.3 % (ref 11.5–15.5)
WBC: 2.5 10*3/uL — ABNORMAL LOW (ref 4.0–10.5)
nRBC: 0 % (ref 0.0–0.2)

## 2020-08-02 LAB — MULTIPLE MYELOMA PANEL, SERUM
Albumin SerPl Elph-Mcnc: 4.3 g/dL (ref 2.9–4.4)
Albumin/Glob SerPl: 0.8 (ref 0.7–1.7)
Alpha 1: 0.2 g/dL (ref 0.0–0.4)
Alpha2 Glob SerPl Elph-Mcnc: 0.8 g/dL (ref 0.4–1.0)
B-Globulin SerPl Elph-Mcnc: 1.1 g/dL (ref 0.7–1.3)
Gamma Glob SerPl Elph-Mcnc: 3.6 g/dL — ABNORMAL HIGH (ref 0.4–1.8)
Globulin, Total: 5.7 g/dL — ABNORMAL HIGH (ref 2.2–3.9)
IgA: 19 mg/dL — ABNORMAL LOW (ref 64–422)
IgG (Immunoglobin G), Serum: 4161 mg/dL — ABNORMAL HIGH (ref 586–1602)
IgM (Immunoglobulin M), Srm: 29 mg/dL (ref 26–217)
M Protein SerPl Elph-Mcnc: 3.4 g/dL — ABNORMAL HIGH
Total Protein ELP: 10 g/dL — ABNORMAL HIGH (ref 6.0–8.5)

## 2020-08-02 LAB — GLUCOSE, CAPILLARY: Glucose-Capillary: 88 mg/dL (ref 70–99)

## 2020-08-02 MED ORDER — HYDROCODONE-ACETAMINOPHEN 5-325 MG PO TABS: 1.0000 | ORAL_TABLET | ORAL | Status: DC | PRN

## 2020-08-02 MED ORDER — LIDOCAINE HCL (PF) 1 % IJ SOLN
INTRAMUSCULAR | Status: AC | PRN
  Administered 2020-08-02: 10 mL via INTRADERMAL

## 2020-08-02 MED ORDER — LIDOCAINE-EPINEPHRINE 1 %-1:100000 IJ SOLN: INTRAMUSCULAR | Status: AC | PRN

## 2020-08-02 MED ORDER — MIDAZOLAM HCL 2 MG/2ML IJ SOLN
INTRAMUSCULAR | Status: AC | PRN
  Administered 2020-08-02: 1 mg via INTRAVENOUS

## 2020-08-02 MED ORDER — FENTANYL CITRATE (PF) 100 MCG/2ML IJ SOLN
INTRAMUSCULAR | Status: AC
  Filled 2020-08-02: qty 2

## 2020-08-02 MED ORDER — MIDAZOLAM HCL 2 MG/2ML IJ SOLN
INTRAMUSCULAR | Status: AC
  Filled 2020-08-02: qty 2

## 2020-08-02 MED ORDER — SODIUM CHLORIDE 0.9 % IV SOLN: INTRAVENOUS | Status: DC

## 2020-08-02 MED ORDER — FENTANYL CITRATE (PF) 100 MCG/2ML IJ SOLN
INTRAMUSCULAR | Status: AC | PRN
  Administered 2020-08-02: 50 ug via INTRAVENOUS

## 2020-08-02 NOTE — Procedures (Signed)
Interventional Radiology Procedure Note  Procedure: CT guided aspirate and core biopsy of right iliac bone  Complications: None  Recommendations: - Bedrest supine x 1 hrs - Hydrocodone PRN  Pain - Follow biopsy results   Tommy Goostree, MD   
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
## 2020-08-02 NOTE — Discharge Instructions (Signed)
Urgent needs - Interventional Radiology on call MD [phone]  Wound - May remove dressing and shower in 24 hours.  Keep site clean and dry.  Replace with bandaid as needed.  Do not submerge in tub or water until site healing well.  I Bone Marrow Aspiration and Bone Marrow Biopsy, Adult, Care After This sheet gives you information about how to care for yourself after your procedure. Your health care provider may also give you more specific instructions. If you have problems or questions, contact your health care provider. What can I expect after the procedure? After the procedure, it is common to have:  Mild pain and tenderness.  Swelling.  Bruising. Follow these instructions at home: Puncture site care  Follow instructions from your health care provider about how to take care of the puncture site. Make sure you: ? Wash your hands with soap and water before and after you change your bandage (dressing). If soap and water are not available, use hand sanitizer. ? Change your dressing as told by your health care provider.  Check your puncture site every day for signs of infection. Check for: ? More redness, swelling, or pain. ? Fluid or blood. ? Warmth. ? Pus or a bad smell.   Activity  Return to your normal activities as told by your health care provider. Ask your health care provider what activities are safe for you.  Do not lift anything that is heavier than 10 lb (4.5 kg), or the limit that you are told, until your health care provider says that it is safe.  Do not drive for 24 hours if you were given a sedative during your procedure. General instructions  Take over-the-counter and prescription medicines only as told by your health care provider.  Do not take baths, swim, or use a hot tub until your health care provider approves. Ask your health care provider if you may take showers. You may only be allowed to take sponge baths.  If directed, put ice on the affected area. To  do this: ? Put ice in a plastic bag. ? Place a towel between your skin and the bag. ? Leave the ice on for 20 minutes, 2-3 times a day.  Keep all follow-up visits as told by your health care provider. This is important.   Contact a health care provider if:  Your pain is not controlled with medicine.  You have a fever.  You have more redness, swelling, or pain around the puncture site.  You have fluid or blood coming from the puncture site.  Your puncture site feels warm to the touch.  You have pus or a bad smell coming from the puncture site. Summary  After the procedure, it is common to have mild pain, tenderness, swelling, and bruising.  Follow instructions from your health care provider about how to take care of the puncture site and what activities are safe for you.  Take over-the-counter and prescription medicines only as told by your health care provider.  Contact a health care provider if you have any signs of infection, such as fluid or blood coming from the puncture site. This information is not intended to replace advice given to you by your health care provider. Make sure you discuss any questions you have with your health care provider. Document Revised: 11/01/2018 Document Reviewed: 11/01/2018 Elsevier Patient Education  2021 [place].   Moderate Conscious Sedation, Adult, Care After This sheet gives you information about how to care for yourself after your  procedure. Your health care provider may also give you more specific instructions. If you have problems or questions, contact your health care provider. What can I expect after the procedure? After the procedure, it is common to have:  Sleepiness for several hours.  Impaired judgment for several hours.  Difficulty with balance.  Vomiting if you eat too soon. Follow these instructions at home: For the time period you were told by your health care provider:  Rest.  Do not participate in activities  where you could fall or become injured.  Do not drive or use machinery.  Do not drink alcohol.  Do not take sleeping pills or medicines that cause drowsiness.  Do not make important decisions or sign legal documents.  Do not take care of children on your own.      Eating and drinking  Follow the diet recommended by your health care provider.  Drink enough fluid to keep your urine pale yellow.  If you vomit: ? Drink water, juice, or soup when you can drink without vomiting. ? Make sure you have little or no nausea before eating solid foods.   General instructions  Take over-the-counter and prescription medicines only as told by your health care provider.  Have a responsible adult stay with you for the time you are told. It is important to have someone help care for you until you are awake and alert.  Do not smoke.  Keep all follow-up visits as told by your health care provider. This is important. Contact a health care provider if:  You are still sleepy or having trouble with balance after 24 hours.  You feel light-headed.  You keep feeling nauseous or you keep vomiting.  You develop a rash.  You have a fever.  You have redness or swelling around the IV site. Get help right away if:  You have trouble breathing.  You have new-onset confusion at home. Summary  After the procedure, it is common to feel sleepy, have impaired judgment, or feel nauseous if you eat too soon.  Rest after you get home. Know the things you should not do after the procedure.  Follow the diet recommended by your health care provider and drink enough fluid to keep your urine pale yellow.  Get help right away if you have trouble breathing or new-onset confusion at home. This information is not intended to replace advice given to you by your health care provider. Make sure you discuss any questions you have with your health care provider. Document Revised: 10/13/2019 Document Reviewed:  05/11/2019 Elsevier Patient Education  2021 [employer].

## 2020-08-06 LAB — SURGICAL PATHOLOGY
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
## 2020-08-07 ENCOUNTER — Inpatient Hospital Stay (HOSPITAL_BASED_OUTPATIENT_CLINIC_OR_DEPARTMENT_OTHER): Payer: Medicare HMO | Admitting: Hematology

## 2020-08-07 ENCOUNTER — Other Ambulatory Visit: Payer: Self-pay

## 2020-08-07 VITALS — BP 126/69 | HR 76 | Temp 98.2°F | Resp 18 | Ht 64.0 in | Wt 151.9 lb

## 2020-08-07 DIAGNOSIS — C9000 Multiple myeloma not having achieved remission: Secondary | ICD-10-CM

## 2020-08-07 DIAGNOSIS — D709 Neutropenia, unspecified: Secondary | ICD-10-CM | POA: Diagnosis not present

## 2020-08-07 DIAGNOSIS — E785 Hyperlipidemia, unspecified: Secondary | ICD-10-CM | POA: Diagnosis not present

## 2020-08-07 DIAGNOSIS — E559 Vitamin D deficiency, unspecified: Secondary | ICD-10-CM | POA: Diagnosis not present

## 2020-08-07 DIAGNOSIS — Z79899 Other long term (current) drug therapy: Secondary | ICD-10-CM | POA: Diagnosis not present

## 2020-08-07 DIAGNOSIS — R69 Illness, unspecified: Secondary | ICD-10-CM | POA: Diagnosis not present

## 2020-08-07 DIAGNOSIS — K219 Gastro-esophageal reflux disease without esophagitis: Secondary | ICD-10-CM | POA: Diagnosis not present

## 2020-08-07 DIAGNOSIS — I10 Essential (primary) hypertension: Secondary | ICD-10-CM | POA: Diagnosis not present

## 2020-08-07 DIAGNOSIS — K589 Irritable bowel syndrome without diarrhea: Secondary | ICD-10-CM | POA: Diagnosis not present

## 2020-08-13 ENCOUNTER — Encounter (HOSPITAL_COMMUNITY): Payer: Self-pay | Admitting: Hematology

## 2020-08-14 ENCOUNTER — Encounter (HOSPITAL_COMMUNITY): Payer: Self-pay | Admitting: Hematology

## 2020-08-15 DIAGNOSIS — Z8249 Family history of ischemic heart disease and other diseases of the circulatory system: Secondary | ICD-10-CM | POA: Diagnosis not present

## 2020-08-15 DIAGNOSIS — H409 Unspecified glaucoma: Secondary | ICD-10-CM | POA: Diagnosis not present

## 2020-08-15 DIAGNOSIS — E785 Hyperlipidemia, unspecified: Secondary | ICD-10-CM | POA: Diagnosis not present

## 2020-08-15 DIAGNOSIS — C9591 Leukemia, unspecified, in remission: Secondary | ICD-10-CM | POA: Diagnosis not present

## 2020-08-15 DIAGNOSIS — Z823 Family history of stroke: Secondary | ICD-10-CM | POA: Diagnosis not present

## 2020-08-15 DIAGNOSIS — E663 Overweight: Secondary | ICD-10-CM | POA: Diagnosis not present

## 2020-08-15 DIAGNOSIS — I10 Essential (primary) hypertension: Secondary | ICD-10-CM | POA: Diagnosis not present

## 2020-08-15 DIAGNOSIS — G4700 Insomnia, unspecified: Secondary | ICD-10-CM | POA: Diagnosis not present

## 2020-08-15 DIAGNOSIS — Z6826 Body mass index (BMI) 26.0-26.9, adult: Secondary | ICD-10-CM | POA: Diagnosis not present

## 2020-08-15 DIAGNOSIS — Z809 Family history of malignant neoplasm, unspecified: Secondary | ICD-10-CM | POA: Diagnosis not present

## 2020-08-15 DIAGNOSIS — Z008 Encounter for other general examination: Secondary | ICD-10-CM | POA: Diagnosis not present

## 2020-08-28 ENCOUNTER — Other Ambulatory Visit: Payer: Self-pay | Admitting: Hematology

## 2020-08-28 DIAGNOSIS — C9000 Multiple myeloma not having achieved remission: Secondary | ICD-10-CM | POA: Insufficient documentation

## 2020-08-28 DIAGNOSIS — Z7189 Other specified counseling: Secondary | ICD-10-CM

## 2020-08-28 MED ORDER — ONDANSETRON HCL 8 MG PO TABS: 8.0000 mg | ORAL_TABLET | Freq: Two times a day (BID) | ORAL | 1 refills | Status: DC | PRN

## 2020-08-28 MED ORDER — LORAZEPAM 0.5 MG PO TABS: 0.5000 mg | ORAL_TABLET | Freq: Four times a day (QID) | ORAL | 0 refills | Status: DC | PRN

## 2020-08-28 MED ORDER — ACYCLOVIR 400 MG PO TABS: 400.0000 mg | ORAL_TABLET | Freq: Two times a day (BID) | ORAL | 3 refills | Status: DC

## 2020-08-28 MED ORDER — DEXAMETHASONE 4 MG PO TABS: ORAL_TABLET | ORAL | 5 refills | Status: DC

## 2020-08-28 MED ORDER — LENALIDOMIDE 15 MG PO CAPS: 15.0000 mg | ORAL_CAPSULE | Freq: Every day | ORAL | 0 refills | Status: DC

## 2020-08-28 MED ORDER — PROCHLORPERAZINE MALEATE 10 MG PO TABS: 10.0000 mg | ORAL_TABLET | Freq: Four times a day (QID) | ORAL | 1 refills | Status: DC | PRN

## 2020-08-28 NOTE — Progress Notes (Signed)
START ON PATHWAY REGIMEN - Multiple Myeloma and Other Plasma Cell Dyscrasias     A cycle is every 21 days:     Bortezomib      Lenalidomide      Dexamethasone   **Always confirm dose/schedule in your pharmacy ordering system**  Patient Characteristics: Multiple Myeloma, Newly Diagnosed, Transplant Eligible, Standard Risk Disease Classification: Multiple Myeloma R-ISS Staging: Unknown Therapeutic Status: Newly Diagnosed Is Patient Eligible for Transplant<= Transplant Eligible Risk Status: Standard Risk Intent of Therapy: Non-Curative / Palliative Intent, Discussed with Patient 

## 2020-08-28 NOTE — Progress Notes (Signed)
Chemotherapy orders placed.. thx

## 2020-08-29 ENCOUNTER — Telehealth: Payer: Self-pay

## 2020-08-29 ENCOUNTER — Telehealth: Payer: Self-pay | Admitting: *Deleted

## 2020-08-29 ENCOUNTER — Telehealth: Payer: Self-pay | Admitting: Pharmacist

## 2020-08-29 DIAGNOSIS — Z299 Encounter for prophylactic measures, unspecified: Secondary | ICD-10-CM

## 2020-08-29 DIAGNOSIS — IMO0001 Reserved for inherently not codable concepts without codable children: Secondary | ICD-10-CM

## 2020-08-29 DIAGNOSIS — Z7189 Other specified counseling: Secondary | ICD-10-CM

## 2020-08-29 DIAGNOSIS — C9000 Multiple myeloma not having achieved remission: Secondary | ICD-10-CM

## 2020-08-29 MED ORDER — LENALIDOMIDE 15 MG PO CAPS: 15.0000 mg | ORAL_CAPSULE | Freq: Every day | ORAL | 0 refills | Status: DC

## 2020-08-29 NOTE — Telephone Encounter (Signed)
Oral Oncology Pharmacist Encounter  Received new prescription for Revlimid (lenalidomide) for the treatment of multiple myeloma in conjunction with Velcade and dexamethasone, planned duration until disease progression or unacceptable drug toxicity.  Prescription dose and frequency assessed for appropriateness. Appropriate for therapy initiation.   CBC w/ Diff and CMP from 07/31/20 assessed, no further dose adjustments required at this time.  Current medication list in Epic reviewed, no relevant/significant DDIs with Revlimid identified.  Evaluated chart and no patient barriers to medication adherence noted.   Patient agreement for Revlimid treatment documented in MD note on 08/07/20.  Prescription has been e-scribed to [place] since Revlimid is a limited distribution medication.   Oral Oncology Clinic will continue to follow for insurance authorization, copayment issues, initial counseling and start date.  Leron Croak, PharmD, BCPS Hematology/Oncology Clinical Pharmacist [facility] [phone] 08/29/2020 1:24 PM

## 2020-08-29 NOTE — Telephone Encounter (Signed)
Oral Oncology Patient Advocate Encounter  Prior Authorization for Revlimid has been approved.    PA# [id number] Effective dates: 06/29/20 through 06/28/21  Patients co-pay is $2935.32  Oral Oncology Clinic will continue to follow.   [place] Patient [place] Phone [phone] Fax [phone] 08/29/2020 10:25 AM

## 2020-08-29 NOTE — Telephone Encounter (Signed)
Contacted patient to enroll in Lenalidomide REMS by phone. Reviewed all questions with her and entered her answers on form electronically.Patient verbalized understanding of medication enrollment questions. Patient gave verbal permission to sign enrollment form electronically on her behalf. Copy of form and medication information mailed to patient.  Form sent to [facility] to be scanned.  Reviewed patient's medications ordered by Dr.Kale for treatment and side effects.  Informed patient that [place] oral pharmacist and pharm financial advocate will contact her. Patient had questions about financial concerns. Advised patient that message sent to [facility] Financial Advocate Ms. White who will contact her.  Informed her that scheduling will contact her to schedule patient education appt and treatment.  Patient verbalized understanding of all information.

## 2020-08-29 NOTE — Telephone Encounter (Signed)
Oral Oncology Patient Advocate Encounter  Received notification from [employer] [employer] that prior authorization for Revlimid is required.  PA submitted on CoverMyMeds Key [id number] Status is pending  Oral Oncology Clinic will continue to follow.  [place] Patient [place] Phone [phone] Fax [phone] 08/29/2020 10:11 AM

## 2020-09-02 ENCOUNTER — Telehealth: Payer: Self-pay

## 2020-09-02 NOTE — Progress Notes (Signed)
Pharmacist Chemotherapy Monitoring - Initial Assessment    Anticipated start date: 09/09/2020   Regimen:  . Are orders appropriate based on the patient's diagnosis, regimen, and cycle? Yes . Does the plan date match the patient's scheduled date? Yes . Is the sequencing of drugs appropriate? Yes . Are the premedications appropriate for the patient's regimen? Yes . Prior Authorization for treatment is: Not Started o If applicable, is the correct biosimilar selected based on the patient's insurance? not applicable  Organ Function and Labs: [name] Are dose adjustments needed based on the patient's renal function, hepatic function, or hematologic function? No . Are appropriate labs ordered prior to the start of patient's treatment? Yes . Other organ system assessment, if indicated: N/A . The following baseline labs, if indicated, have been ordered: N/A  Dose Assessment: . Are the drug doses appropriate? Yes . Are the following correct: o Drug concentrations Yes o IV fluid compatible with drug Yes o Administration routes Yes o Timing of therapy Yes . If applicable, does the patient have documented access for treatment and/or plans for port-a-cath placement? not applicable, Velcade is [place]  . If applicable, have lifetime cumulative doses been properly documented and assessed? not applicable Lifetime Dose Tracking  No doses have been documented on this patient for the following tracked chemicals: Doxorubicin, Epirubicin, Idarubicin, Daunorubicin, Mitoxantrone, Bleomycin, Oxaliplatin, Carboplatin, Liposomal Doxorubicin  o   Toxicity Monitoring/Prevention: . The patient has the following take home antiemetics prescribed: Ondansetron, Prochlorperazine, and Lorazepam . The patient has the following take home medications prescribed: VZV prophylaxis . Medication allergies and previous infusion related reactions, if applicable, have been reviewed and addressed. Yes . The patient's current medication list  has been assessed for drug-drug interactions with their chemotherapy regimen. no significant drug-drug interactions were identified on review.  Order Review: . Are the treatment plan orders signed? Yes . Is the patient scheduled to see a provider prior to their treatment? No  I verify that I have reviewed each item in the above checklist and answered each question accordingly.  Estrella Myrtle, PharmD, BCPS PGY-2 Hematology/Oncology Pharmacy Resident  09/02/2020  12:48 PM

## 2020-09-02 NOTE — Telephone Encounter (Signed)
Received call from patient wanting to know about some medications she had to pick up from the pharmacy that had been ordered by Dr. Irene Limbo. She was also displeased at not being aware of her upcoming treatment appointment dates and times.  Informed patient that she would be having a chemo education class first on Thursday 3/10 at 4:00 pm with the chemo education nurse. Advised that she attends the class with the medication list for better clarification. Patient suggested that she would bring the medications along instead, agreed that this was a good idea.  Patient aware of her chemo education class scheduled for Thursday at 4:00 pm.   Patient will have ordered home medications with her that needs clarification on administration.  Patient will need appointment schedule printed out.

## 2020-09-02 NOTE — Telephone Encounter (Signed)
Oral Oncology Patient Advocate Encounter   Was successful in securing patient a $12000 grant from [facility] [employer] (PAF) to provide copayment coverage for Revlimid.  This will keep the out of pocket expense at $0.     I have spoken with the patient.    The billing information is as follows and has been shared with [facility] by [employer].   RxBin: [id number] PCN:  [id number] Member ID: [id number] Group ID: [id number] Dates of Eligibility: 09/02/20 through 09/02/21  [facility] Phone [phone] Fax [phone] 09/02/2020 1:12 PM

## 2020-09-04 NOTE — Telephone Encounter (Signed)
Oral Chemotherapy Pharmacist Encounter  I spoke with patient to follow up on: Revlimid (lenalidomide)  Revlimid prescription is being dispensed from [facility] pharmacy as it is a limited distribution medication. This will be delivered to the [facility] on [address] on 09/04/20 and patient will pick up medication in afternoon.  Patient requests discussion of Revlimid after her chemotherapy education class scheduled for 09/05/20 at [place], PharmD, BCPS Hematology/Oncology Clinical Pharmacist [facility] [phone] 09/04/2020 10:18 AM

## 2020-09-05 ENCOUNTER — Other Ambulatory Visit: Payer: Self-pay

## 2020-09-05 ENCOUNTER — Inpatient Hospital Stay: Payer: Medicare HMO | Attending: Hematology

## 2020-09-05 ENCOUNTER — Encounter: Payer: Self-pay | Admitting: Hematology

## 2020-09-05 DIAGNOSIS — D709 Neutropenia, unspecified: Secondary | ICD-10-CM | POA: Insufficient documentation

## 2020-09-05 DIAGNOSIS — Z7982 Long term (current) use of aspirin: Secondary | ICD-10-CM | POA: Insufficient documentation

## 2020-09-05 DIAGNOSIS — Z79899 Other long term (current) drug therapy: Secondary | ICD-10-CM | POA: Insufficient documentation

## 2020-09-05 DIAGNOSIS — E119 Type 2 diabetes mellitus without complications: Secondary | ICD-10-CM | POA: Insufficient documentation

## 2020-09-05 DIAGNOSIS — C9000 Multiple myeloma not having achieved remission: Secondary | ICD-10-CM | POA: Insufficient documentation

## 2020-09-05 DIAGNOSIS — M8580 Other specified disorders of bone density and structure, unspecified site: Secondary | ICD-10-CM | POA: Insufficient documentation

## 2020-09-05 DIAGNOSIS — I10 Essential (primary) hypertension: Secondary | ICD-10-CM | POA: Insufficient documentation

## 2020-09-05 DIAGNOSIS — Z5112 Encounter for antineoplastic immunotherapy: Secondary | ICD-10-CM | POA: Insufficient documentation

## 2020-09-05 NOTE — Progress Notes (Signed)
Attended patient ed session and was given information on Revlamid/Dexmethasone and Velcade. We discussed how to take medication and gave patient detailed medication chart. Leron Croak PharmD also met with Ms Mccannon in regard to her Revlamid dosing.  Revlamid is day 1-14, pt given medication box to set up for days 1-14. We detailed how to take Dexamethasone at home on Day15 only (5 tabs with breakfast), and how to take Acyclovir twice a day starting now. We discussed in great detail how to take anti nausea meds if needed.  Patient demonstrated teachback method to reinforce learning.

## 2020-09-05 NOTE — Progress Notes (Signed)
Called pt to introduce myself as her Arboriculturist, discuss copay assistance and the [employer].  Pt gave me consent to apply in her behalf so I completed the online application w/ the [facility] and she was approved for $11,000 for Velcade from 08/06/20 through 08/05/21.  I also offered the [employer] and went over what it covers.  Pt would like to apply so she will bring her proof of income on 09/12/20.  If approved I will give her an expense sheet and my card for any questions or concerns she may have in the future.

## 2020-09-05 NOTE — Telephone Encounter (Signed)
Oral Chemotherapy Pharmacist Encounter  I spoke with patient for overview of: Revlimid for the treatment of multiple myeloma in conjunction with Velcade and dexamethasone, planned duration until disease progression or unacceptable drug toxicity.  Counseled patient on administration, dosing, side effects, monitoring, drug-food interactions, safe handling, storage, and disposal.  Patient will take Revlimid 25m capsules, 1 capsule by mouth once daily, without regard to food, with a full glass of water.  Revlimid will be given 14 days on, 7 days off, repeat every 21 days.  Patient will take dexamethasone 480mtablets, 5 tablets (2020mby mouth on day 15 of each cycle. Patient will take with breakfast. Dexamethasone will be administered in clinic on other weeks.  Revlimid start date: 09/09/20  Adverse effects of Revlimid include but are not limited to: nausea, constipation, diarrhea, abdominal pain, rash, fatigue, drug fever, and decreased blood counts.    Reviewed with patient importance of keeping a medication schedule and plan for any missed doses. No barriers to medication adherence identified.  Medication reconciliation performed and medication/allergy list updated.  Patient has picked up acyclovir and dexamethasone prescriptions. Patient counseled on importance of daily aspirin 69m82mr VTE prophylaxis.  Insurance authorization for Revlimid has been obtained.  Revlimid prescription [facility] being [facility]ensed from [facility] is a limited distribution medication.  All questions answered.  Ms. TateFullenwiderced understanding and appreciation.   Medication education handout and medication calendar given to patient. Patient knows to call the office with questions or concerns. Oral Chemotherapy Clinic phone number provided to patient.   RebeLeron CroakarmD, BCPS Hematology/Oncology Clinical Pharmacist [phone]/2022  4:13 PM

## 2020-09-06 ENCOUNTER — Other Ambulatory Visit: Payer: Self-pay | Admitting: *Deleted

## 2020-09-06 DIAGNOSIS — C9000 Multiple myeloma not having achieved remission: Secondary | ICD-10-CM
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
## 2020-09-09 ENCOUNTER — Other Ambulatory Visit: Payer: Self-pay

## 2020-09-09 ENCOUNTER — Inpatient Hospital Stay (HOSPITAL_BASED_OUTPATIENT_CLINIC_OR_DEPARTMENT_OTHER): Payer: Medicare HMO | Admitting: Hematology

## 2020-09-09 ENCOUNTER — Inpatient Hospital Stay: Payer: Medicare HMO

## 2020-09-09 VITALS — BP 140/75 | HR 75 | Resp 16

## 2020-09-09 VITALS — BP 140/67 | HR 66 | Temp 97.7°F | Resp 13 | Ht 64.0 in | Wt 147.5 lb

## 2020-09-09 DIAGNOSIS — C9000 Multiple myeloma not having achieved remission: Secondary | ICD-10-CM

## 2020-09-09 DIAGNOSIS — Z79899 Other long term (current) drug therapy: Secondary | ICD-10-CM | POA: Diagnosis not present

## 2020-09-09 DIAGNOSIS — Z5111 Encounter for antineoplastic chemotherapy: Secondary | ICD-10-CM | POA: Diagnosis not present

## 2020-09-09 DIAGNOSIS — M8580 Other specified disorders of bone density and structure, unspecified site: Secondary | ICD-10-CM | POA: Diagnosis not present

## 2020-09-09 DIAGNOSIS — D709 Neutropenia, unspecified: Secondary | ICD-10-CM | POA: Diagnosis not present

## 2020-09-09 DIAGNOSIS — Z7189 Other specified counseling: Secondary | ICD-10-CM

## 2020-09-09 DIAGNOSIS — Z7982 Long term (current) use of aspirin: Secondary | ICD-10-CM | POA: Diagnosis not present

## 2020-09-09 DIAGNOSIS — I10 Essential (primary) hypertension: Secondary | ICD-10-CM | POA: Diagnosis not present

## 2020-09-09 DIAGNOSIS — Z5112 Encounter for antineoplastic immunotherapy: Secondary | ICD-10-CM | POA: Diagnosis present

## 2020-09-09 DIAGNOSIS — E119 Type 2 diabetes mellitus without complications: Secondary | ICD-10-CM | POA: Diagnosis not present

## 2020-09-09 LAB — CBC WITH DIFFERENTIAL (CANCER CENTER ONLY)
Abs Immature Granulocytes: 0 K/uL (ref 0.00–0.07)
Basophils Absolute: 0 K/uL (ref 0.0–0.1)
Basophils Relative: 1 %
Eosinophils Absolute: 0 K/uL (ref 0.0–0.5)
Eosinophils Relative: 1 %
HCT: 34.7 % — ABNORMAL LOW (ref 36.0–46.0)
Hemoglobin: 11.6 g/dL — ABNORMAL LOW (ref 12.0–15.0)
Immature Granulocytes: 0 %
Lymphocytes Relative: 36 %
Lymphs Abs: 0.9 K/uL (ref 0.7–4.0)
MCH: 33.5 pg (ref 26.0–34.0)
MCHC: 33.4 g/dL (ref 30.0–36.0)
MCV: 100.3 fL — ABNORMAL HIGH (ref 80.0–100.0)
Monocytes Absolute: 0.3 K/uL (ref 0.1–1.0)
Monocytes Relative: 11 %
Neutro Abs: 1.3 K/uL — ABNORMAL LOW (ref 1.7–7.7)
Neutrophils Relative %: 51 %
Platelet Count: 185 K/uL (ref 150–400)
RBC: 3.46 MIL/uL — ABNORMAL LOW (ref 3.87–5.11)
RDW: 14 % (ref 11.5–15.5)
WBC Count: 2.6 K/uL — ABNORMAL LOW (ref 4.0–10.5)
nRBC: 0 % (ref 0.0–0.2)

## 2020-09-09 LAB — CMP (CANCER CENTER ONLY)
ALT: 25 U/L (ref 0–44)
AST: 30 U/L (ref 15–41)
Albumin: 3.8 g/dL (ref 3.5–5.0)
Alkaline Phosphatase: 44 U/L (ref 38–126)
Anion gap: 3 — ABNORMAL LOW (ref 5–15)
BUN: 13 mg/dL (ref 8–23)
CO2: 27 mmol/L (ref 22–32)
Calcium: 9.6 mg/dL (ref 8.9–10.3)
Chloride: 106 mmol/L (ref 98–111)
Creatinine: 0.79 mg/dL (ref 0.44–1.00)
GFR, Estimated: >60 mL/min
Glucose, Bld: 101 mg/dL — ABNORMAL HIGH (ref 70–99)
Potassium: 4.4 mmol/L (ref 3.5–5.1)
Sodium: 136 mmol/L (ref 135–145)
Total Bilirubin: 0.3 mg/dL (ref 0.3–1.2)
Total Protein: 10.6 g/dL — ABNORMAL HIGH (ref 6.5–8.1)

## 2020-09-09 MED ORDER — BORTEZOMIB CHEMO SQ INJECTION 3.5 MG (2.5MG/ML)
1.3000 mg/m2 | Freq: Once | INTRAMUSCULAR | Status: AC
  Administered 2020-09-09: 2.25 mg via SUBCUTANEOUS
  Filled 2020-09-09: qty 0.9

## 2020-09-09 MED ORDER — DEXAMETHASONE 4 MG PO TABS
ORAL_TABLET | ORAL | Status: AC
  Filled 2020-09-09: qty 5

## 2020-09-09 MED ORDER — DEXAMETHASONE 4 MG PO TABS
20.0000 mg | ORAL_TABLET | Freq: Once | ORAL | Status: AC
  Administered 2020-09-09: 20 mg via ORAL

## 2020-09-09 NOTE — Patient Instructions (Signed)
[facility] Discharge Instructions for Patients Receiving Chemotherapy  Today you received the following chemotherapy agents: Bortezomib (Velcade)  To help prevent nausea and vomiting after your treatment, we encourage you to take your nausea medication  as prescribed.    If you develop nausea and vomiting that is not controlled by your nausea medication, call the clinic.   BELOW ARE SYMPTOMS THAT SHOULD BE REPORTED IMMEDIATELY:  *FEVER GREATER THAN 100.5 F  *CHILLS WITH OR WITHOUT FEVER  NAUSEA AND VOMITING THAT IS NOT CONTROLLED WITH YOUR NAUSEA MEDICATION  *UNUSUAL SHORTNESS OF BREATH  *UNUSUAL BRUISING OR BLEEDING  TENDERNESS IN MOUTH AND THROAT WITH OR WITHOUT PRESENCE OF ULCERS  *URINARY PROBLEMS  *BOWEL PROBLEMS  UNUSUAL RASH Items with * indicate a potential emergency and should be followed up as soon as possible.  Feel free to call the clinic should you have any questions or concerns. The clinic phone number is [phone].  Please show the [place] at check-in to the Emergency Department and triage nurse.  Bortezomib injection What is this medicine? BORTEZOMIB (bor TEZ oh mib) targets proteins in cancer cells and stops the cancer cells from growing. It treats multiple myeloma and mantle cell lymphoma. This medicine may be used for other purposes; ask your health care provider or pharmacist if you have questions. COMMON BRAND NAME(S): Velcade What should I tell my health care provider before I take this medicine? They need to know if you have any of these conditions:  dehydration  diabetes (high blood sugar)  heart disease  liver disease  tingling of the fingers or toes or other nerve disorder  an unusual or allergic reaction to bortezomib, mannitol, boron, other medicines, foods, dyes, or preservatives  pregnant or trying to get pregnant  breast-feeding How should I use this medicine? This medicine is injected into a vein  or under the skin. It is given by a health care provider in a hospital or clinic setting. Talk to your health care provider about the use of this medicine in children. Special care may be needed. Overdosage: If you think you have taken too much of this medicine contact a poison control center or emergency room at once. NOTE: This medicine is only for you. Do not share this medicine with others. What if I miss a dose? Keep appointments for follow-up doses. It is important not to miss your dose. Call your health care provider if you are unable to keep an appointment. What may interact with this medicine? This medicine may interact with the following medications:  ketoconazole  rifampin This list may not describe all possible interactions. Give your health care provider a list of all the medicines, herbs, non-prescription drugs, or dietary supplements you use. Also tell them if you smoke, drink alcohol, or use illegal drugs. Some items may interact with your medicine. What should I watch for while using this medicine? Your condition will be monitored carefully while you are receiving this medicine. You may need blood work done while you are taking this medicine. You may get drowsy or dizzy. Do not drive, use machinery, or do anything that needs mental alertness until you know how this medicine affects you. Do not stand up or sit up quickly, especially if you are an older patient. This reduces the risk of dizzy or fainting spells This medicine may increase your risk of getting an infection. Call your health care provider for advice if you get a fever, chills, sore throat, or other  symptoms of a cold or flu. Do not treat yourself. Try to avoid being around people who are sick. Check with your health care provider if you have severe diarrhea, nausea, and vomiting, or if you sweat a lot. The loss of too much body fluid may make it dangerous for you to take this medicine. Do not become pregnant while  taking this medicine or for 7 months after stopping it. Women should inform their health care provider if they wish to become pregnant or think they might be pregnant. Men should not father a child while taking this medicine and for 4 months after stopping it. There is a potential for serious harm to an unborn child. Talk to your health care provider for more information. Do not breast-feed an infant while taking this medicine or for 2 months after stopping it. This medicine may make it more difficult to get pregnant or father a child. Talk to your health care provider if you are concerned about your fertility. What side effects may I notice from receiving this medicine? Side effects that you should report to your doctor or health care professional as soon as possible:  allergic reactions (skin rash; itching or hives; swelling of the face, lips, or tongue)  bleeding (bloody or black, tarry stools; red or dark brown urine; spitting up blood or brown material that looks like coffee grounds; red spots on the skin; unusual bruising or bleeding from the eye, gums, or nose)  blurred vision or changes in vision  confusion  constipation  headache  heart failure (trouble breathing; fast, irregular heartbeat; sudden weight gain; swelling of the ankles, feet, hands)  infection (fever, chills, cough, sore throat, pain or trouble passing urine)  lack or loss of appetite  liver injury (dark yellow or brown urine; general ill feeling or flu-like symptoms; loss of appetite, right upper belly pain; yellowing of the eyes or skin)  low blood pressure (dizziness; feeling faint or lightheaded, falls; unusually weak or tired)  muscle cramps  pain, redness, or irritation at site where injected  pain, tingling, numbness in the hands or feet  seizures  trouble breathing  unusual bruising or bleeding Side effects that usually do not require medical attention (report to your doctor or health care  professional if they continue or are bothersome):  diarrhea  nausea  stomach pain  trouble sleeping  vomiting This list may not describe all possible side effects. Call your doctor for medical advice about side effects. You may report side effects to FDA at 1-800-FDA-1088. Where should I keep my medicine? This medicine is given in a hospital or clinic. It will not be stored at home. NOTE: This sheet is a summary. It may not cover all possible information. If you have questions about this medicine, talk to your doctor, pharmacist, or health care provider.  2021 Elsevier/Gold Standard (2020-06-06 13:22:53)

## 2020-09-10 LAB — KAPPA/LAMBDA LIGHT CHAINS
Kappa free light chain: 33.6 mg/L — ABNORMAL HIGH (ref 3.3–19.4)
Kappa, lambda light chain ratio: 8.2 — ABNORMAL HIGH (ref 0.26–1.65)
Lambda free light chains: 4.1 mg/L — ABNORMAL LOW (ref 5.7–26.3)

## 2020-09-11 ENCOUNTER — Telehealth: Payer: Self-pay | Admitting: *Deleted

## 2020-09-11 ENCOUNTER — Telehealth: Payer: Self-pay | Admitting: Hematology

## 2020-09-11 NOTE — Telephone Encounter (Signed)
Added provider appointment per 3/14 los. Patient is aware and requested future appointments preferably 2pm or later.

## 2020-09-12 ENCOUNTER — Other Ambulatory Visit: Payer: Self-pay

## 2020-09-12 ENCOUNTER — Encounter: Payer: Self-pay | Admitting: Hematology

## 2020-09-12 ENCOUNTER — Inpatient Hospital Stay: Payer: Medicare HMO

## 2020-09-12 ENCOUNTER — Other Ambulatory Visit: Payer: Self-pay | Admitting: *Deleted

## 2020-09-12 VITALS — BP 131/80 | HR 86 | Temp 97.9°F | Resp 17

## 2020-09-12 DIAGNOSIS — Z79899 Other long term (current) drug therapy: Secondary | ICD-10-CM | POA: Diagnosis not present

## 2020-09-12 DIAGNOSIS — C9000 Multiple myeloma not having achieved remission: Secondary | ICD-10-CM

## 2020-09-12 DIAGNOSIS — M8580 Other specified disorders of bone density and structure, unspecified site: Secondary | ICD-10-CM | POA: Diagnosis not present

## 2020-09-12 DIAGNOSIS — Z7982 Long term (current) use of aspirin: Secondary | ICD-10-CM | POA: Diagnosis not present

## 2020-09-12 DIAGNOSIS — D709 Neutropenia, unspecified: Secondary | ICD-10-CM | POA: Diagnosis not present

## 2020-09-12 DIAGNOSIS — Z5112 Encounter for antineoplastic immunotherapy: Secondary | ICD-10-CM | POA: Diagnosis not present

## 2020-09-12 DIAGNOSIS — I10 Essential (primary) hypertension: Secondary | ICD-10-CM | POA: Diagnosis not present

## 2020-09-12 DIAGNOSIS — E119 Type 2 diabetes mellitus without complications: Secondary | ICD-10-CM | POA: Diagnosis not present

## 2020-09-12 DIAGNOSIS — Z7189 Other specified counseling: Secondary | ICD-10-CM

## 2020-09-12 LAB — MULTIPLE MYELOMA PANEL, SERUM
Albumin SerPl Elph-Mcnc: 4.2 g/dL (ref 2.9–4.4)
Albumin/Glob SerPl: 0.8 (ref 0.7–1.7)
Alpha 1: 0.2 g/dL (ref 0.0–0.4)
Alpha2 Glob SerPl Elph-Mcnc: 0.8 g/dL (ref 0.4–1.0)
B-Globulin SerPl Elph-Mcnc: 1 g/dL (ref 0.7–1.3)
Gamma Glob SerPl Elph-Mcnc: 3.4 g/dL — ABNORMAL HIGH (ref 0.4–1.8)
Globulin, Total: 5.4 g/dL — ABNORMAL HIGH (ref 2.2–3.9)
IgA: 18 mg/dL — ABNORMAL LOW (ref 64–422)
IgG (Immunoglobin G), Serum: 4309 mg/dL — ABNORMAL HIGH (ref 586–1602)
IgM (Immunoglobulin M), Srm: 30 mg/dL (ref 26–217)
M Protein SerPl Elph-Mcnc: 3.1 g/dL — ABNORMAL HIGH
Total Protein ELP: 9.6 g/dL — ABNORMAL HIGH (ref 6.0–8.5)

## 2020-09-12 MED ORDER — DEXAMETHASONE 4 MG PO TABS
20.0000 mg | ORAL_TABLET | Freq: Once | ORAL | Status: AC
  Administered 2020-09-12: 20 mg via ORAL

## 2020-09-12 MED ORDER — DEXAMETHASONE 4 MG PO TABS
ORAL_TABLET | ORAL | Status: AC
  Filled 2020-09-12: qty 5

## 2020-09-12 MED ORDER — BORTEZOMIB CHEMO SQ INJECTION 3.5 MG (2.5MG/ML)
1.3000 mg/m2 | Freq: Once | INTRAMUSCULAR | Status: AC
  Administered 2020-09-12: 2.25 mg via SUBCUTANEOUS
  Filled 2020-09-12: qty 0.9

## 2020-09-12 NOTE — Progress Notes (Signed)
Pt is approved for the $1000 [employer] grant.  

## 2020-09-12 NOTE — Patient Instructions (Signed)
[facility] Discharge Instructions for Patients Receiving Chemotherapy  Today you received the following chemotherapy agents: Bortezomib (Velcade)  To help prevent nausea and vomiting after your treatment, we encourage you to take your nausea medication  as prescribed.    If you develop nausea and vomiting that is not controlled by your nausea medication, call the clinic.   BELOW ARE SYMPTOMS THAT SHOULD BE REPORTED IMMEDIATELY:  *FEVER GREATER THAN 100.5 F  *CHILLS WITH OR WITHOUT FEVER  NAUSEA AND VOMITING THAT IS NOT CONTROLLED WITH YOUR NAUSEA MEDICATION  *UNUSUAL SHORTNESS OF BREATH  *UNUSUAL BRUISING OR BLEEDING  TENDERNESS IN MOUTH AND THROAT WITH OR WITHOUT PRESENCE OF ULCERS  *URINARY PROBLEMS  *BOWEL PROBLEMS  UNUSUAL RASH Items with * indicate a potential emergency and should be followed up as soon as possible.  Feel free to call the clinic should you have any questions or concerns. The clinic phone number is [phone].  Please show the [place] at check-in to the Emergency Department and triage nurse.  Bortezomib injection What is this medicine? BORTEZOMIB (bor TEZ oh mib) targets proteins in cancer cells and stops the cancer cells from growing. It treats multiple myeloma and mantle cell lymphoma. This medicine may be used for other purposes; ask your health care provider or pharmacist if you have questions. COMMON BRAND NAME(S): Velcade What should I tell my health care provider before I take this medicine? They need to know if you have any of these conditions:  dehydration  diabetes (high blood sugar)  heart disease  liver disease  tingling of the fingers or toes or other nerve disorder  an unusual or allergic reaction to bortezomib, mannitol, boron, other medicines, foods, dyes, or preservatives  pregnant or trying to get pregnant  breast-feeding How should I use this medicine? This medicine is injected into a vein  or under the skin. It is given by a health care provider in a hospital or clinic setting. Talk to your health care provider about the use of this medicine in children. Special care may be needed. Overdosage: If you think you have taken too much of this medicine contact a poison control center or emergency room at once. NOTE: This medicine is only for you. Do not share this medicine with others. What if I miss a dose? Keep appointments for follow-up doses. It is important not to miss your dose. Call your health care provider if you are unable to keep an appointment. What may interact with this medicine? This medicine may interact with the following medications:  ketoconazole  rifampin This list may not describe all possible interactions. Give your health care provider a list of all the medicines, herbs, non-prescription drugs, or dietary supplements you use. Also tell them if you smoke, drink alcohol, or use illegal drugs. Some items may interact with your medicine. What should I watch for while using this medicine? Your condition will be monitored carefully while you are receiving this medicine. You may need blood work done while you are taking this medicine. You may get drowsy or dizzy. Do not drive, use machinery, or do anything that needs mental alertness until you know how this medicine affects you. Do not stand up or sit up quickly, especially if you are an older patient. This reduces the risk of dizzy or fainting spells This medicine may increase your risk of getting an infection. Call your health care provider for advice if you get a fever, chills, sore throat, or other  symptoms of a cold or flu. Do not treat yourself. Try to avoid being around people who are sick. Check with your health care provider if you have severe diarrhea, nausea, and vomiting, or if you sweat a lot. The loss of too much body fluid may make it dangerous for you to take this medicine. Do not become pregnant while  taking this medicine or for 7 months after stopping it. Women should inform their health care provider if they wish to become pregnant or think they might be pregnant. Men should not father a child while taking this medicine and for 4 months after stopping it. There is a potential for serious harm to an unborn child. Talk to your health care provider for more information. Do not breast-feed an infant while taking this medicine or for 2 months after stopping it. This medicine may make it more difficult to get pregnant or father a child. Talk to your health care provider if you are concerned about your fertility. What side effects may I notice from receiving this medicine? Side effects that you should report to your doctor or health care professional as soon as possible:  allergic reactions (skin rash; itching or hives; swelling of the face, lips, or tongue)  bleeding (bloody or black, tarry stools; red or dark brown urine; spitting up blood or brown material that looks like coffee grounds; red spots on the skin; unusual bruising or bleeding from the eye, gums, or nose)  blurred vision or changes in vision  confusion  constipation  headache  heart failure (trouble breathing; fast, irregular heartbeat; sudden weight gain; swelling of the ankles, feet, hands)  infection (fever, chills, cough, sore throat, pain or trouble passing urine)  lack or loss of appetite  liver injury (dark yellow or brown urine; general ill feeling or flu-like symptoms; loss of appetite, right upper belly pain; yellowing of the eyes or skin)  low blood pressure (dizziness; feeling faint or lightheaded, falls; unusually weak or tired)  muscle cramps  pain, redness, or irritation at site where injected  pain, tingling, numbness in the hands or feet  seizures  trouble breathing  unusual bruising or bleeding Side effects that usually do not require medical attention (report to your doctor or health care  professional if they continue or are bothersome):  diarrhea  nausea  stomach pain  trouble sleeping  vomiting This list may not describe all possible side effects. Call your doctor for medical advice about side effects. You may report side effects to FDA at 1-800-FDA-1088. Where should I keep my medicine? This medicine is given in a hospital or clinic. It will not be stored at home. NOTE: This sheet is a summary. It may not cover all possible information. If you have questions about this medicine, talk to your doctor, pharmacist, or health care provider.  2021 Elsevier/Gold Standard (2020-06-06 13:22:53)
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
## 2020-09-16 ENCOUNTER — Inpatient Hospital Stay: Payer: Medicare HMO | Admitting: Hematology

## 2020-09-16 ENCOUNTER — Other Ambulatory Visit: Payer: Self-pay

## 2020-09-16 ENCOUNTER — Inpatient Hospital Stay: Payer: Medicare HMO

## 2020-09-16 VITALS — BP 144/78 | HR 69 | Temp 97.8°F | Resp 13 | Ht 64.0 in | Wt 148.1 lb

## 2020-09-16 DIAGNOSIS — D709 Neutropenia, unspecified: Secondary | ICD-10-CM | POA: Diagnosis not present

## 2020-09-16 DIAGNOSIS — Z5111 Encounter for antineoplastic chemotherapy: Secondary | ICD-10-CM

## 2020-09-16 DIAGNOSIS — E119 Type 2 diabetes mellitus without complications: Secondary | ICD-10-CM | POA: Diagnosis not present

## 2020-09-16 DIAGNOSIS — Z7189 Other specified counseling: Secondary | ICD-10-CM

## 2020-09-16 DIAGNOSIS — Z7982 Long term (current) use of aspirin: Secondary | ICD-10-CM | POA: Diagnosis not present

## 2020-09-16 DIAGNOSIS — Z5112 Encounter for antineoplastic immunotherapy: Secondary | ICD-10-CM | POA: Diagnosis not present

## 2020-09-16 DIAGNOSIS — C9000 Multiple myeloma not having achieved remission: Secondary | ICD-10-CM

## 2020-09-16 DIAGNOSIS — I10 Essential (primary) hypertension: Secondary | ICD-10-CM | POA: Diagnosis not present

## 2020-09-16 DIAGNOSIS — Z79899 Other long term (current) drug therapy: Secondary | ICD-10-CM | POA: Diagnosis not present

## 2020-09-16 DIAGNOSIS — M8580 Other specified disorders of bone density and structure, unspecified site: Secondary | ICD-10-CM | POA: Diagnosis not present

## 2020-09-16 LAB — CBC WITH DIFFERENTIAL (CANCER CENTER ONLY)
Abs Immature Granulocytes: 0.01 10*3/uL (ref 0.00–0.07)
Basophils Absolute: 0 10*3/uL (ref 0.0–0.1)
Basophils Relative: 0 %
Eosinophils Absolute: 0.1 10*3/uL (ref 0.0–0.5)
Eosinophils Relative: 2 %
HCT: 35.2 % — ABNORMAL LOW (ref 36.0–46.0)
Hemoglobin: 11.9 g/dL — ABNORMAL LOW (ref 12.0–15.0)
Immature Granulocytes: 0 %
Lymphocytes Relative: 39 %
Lymphs Abs: 1.4 10*3/uL (ref 0.7–4.0)
MCH: 34 pg (ref 26.0–34.0)
MCHC: 33.8 g/dL (ref 30.0–36.0)
MCV: 100.6 fL — ABNORMAL HIGH (ref 80.0–100.0)
Monocytes Absolute: 0.6 10*3/uL (ref 0.1–1.0)
Monocytes Relative: 16 %
Neutro Abs: 1.5 10*3/uL — ABNORMAL LOW (ref 1.7–7.7)
Neutrophils Relative %: 43 %
Platelet Count: 149 10*3/uL — ABNORMAL LOW (ref 150–400)
RBC: 3.5 MIL/uL — ABNORMAL LOW (ref 3.87–5.11)
RDW: 14.5 % (ref 11.5–15.5)
WBC Count: 3.6 10*3/uL — ABNORMAL LOW (ref 4.0–10.5)
nRBC: 0 % (ref 0.0–0.2)

## 2020-09-16 LAB — CMP (CANCER CENTER ONLY)
ALT: 28 U/L (ref 0–44)
AST: 23 U/L (ref 15–41)
Albumin: 3.5 g/dL (ref 3.5–5.0)
Alkaline Phosphatase: 37 U/L — ABNORMAL LOW (ref 38–126)
Anion gap: 3 — ABNORMAL LOW (ref 5–15)
BUN: 13 mg/dL (ref 8–23)
CO2: 28 mmol/L (ref 22–32)
Calcium: 9.7 mg/dL (ref 8.9–10.3)
Chloride: 107 mmol/L (ref 98–111)
Creatinine: 0.8 mg/dL (ref 0.44–1.00)
GFR, Estimated: >60 mL/min (ref >60)
Glucose, Bld: 95 mg/dL (ref 70–99)
Potassium: 4.1 mmol/L (ref 3.5–5.1)
Sodium: 138 mmol/L (ref 135–145)
Total Bilirubin: 0.3 mg/dL (ref 0.3–1.2)
Total Protein: 9.2 g/dL — ABNORMAL HIGH (ref 6.5–8.1)

## 2020-09-16 MED ORDER — DEXAMETHASONE 4 MG PO TABS
20.0000 mg | ORAL_TABLET | Freq: Once | ORAL | Status: AC
  Administered 2020-09-16: 20 mg via ORAL

## 2020-09-16 MED ORDER — BORTEZOMIB CHEMO SQ INJECTION 3.5 MG (2.5MG/ML)
1.3000 mg/m2 | Freq: Once | INTRAMUSCULAR | Status: AC
  Administered 2020-09-16: 2.25 mg via SUBCUTANEOUS
  Filled 2020-09-16: qty 0.9

## 2020-09-16 MED ORDER — DEXAMETHASONE 4 MG PO TABS
ORAL_TABLET | ORAL | Status: AC
  Filled 2020-09-16: qty 5

## 2020-09-16 NOTE — Patient Instructions (Signed)
[facility] Discharge Instructions for Patients Receiving Chemotherapy  Today you received the following chemotherapy agent: Bortezomib (Velcade)  To help prevent nausea and vomiting after your treatment, we encourage you to take your nausea medication as directed by your MD.   If you develop nausea and vomiting that is not controlled by your nausea medication, call the clinic.   BELOW ARE SYMPTOMS THAT SHOULD BE REPORTED IMMEDIATELY:  *FEVER GREATER THAN 100.5 F  *CHILLS WITH OR WITHOUT FEVER  NAUSEA AND VOMITING THAT IS NOT CONTROLLED WITH YOUR NAUSEA MEDICATION  *UNUSUAL SHORTNESS OF BREATH  *UNUSUAL BRUISING OR BLEEDING  TENDERNESS IN MOUTH AND THROAT WITH OR WITHOUT PRESENCE OF ULCERS  *URINARY PROBLEMS  *BOWEL PROBLEMS  UNUSUAL RASH Items with * indicate a potential emergency and should be followed up as soon as possible.  Feel free to call the clinic should you have any questions or concerns. The clinic phone number is [phone].  Please show the CHEMO ALERT CARD at check-in to the Emergency Department and triage nurse.   

## 2020-09-18 ENCOUNTER — Other Ambulatory Visit: Payer: Self-pay

## 2020-09-18 ENCOUNTER — Inpatient Hospital Stay: Payer: Medicare HMO

## 2020-09-18 ENCOUNTER — Other Ambulatory Visit: Payer: Self-pay | Admitting: *Deleted

## 2020-09-18 ENCOUNTER — Ambulatory Visit: Payer: Medicare HMO | Admitting: Hematology

## 2020-09-18 VITALS — BP 152/72 | HR 77 | Temp 98.1°F | Resp 16

## 2020-09-18 DIAGNOSIS — I10 Essential (primary) hypertension: Secondary | ICD-10-CM | POA: Diagnosis not present

## 2020-09-18 DIAGNOSIS — Z7189 Other specified counseling: Secondary | ICD-10-CM

## 2020-09-18 DIAGNOSIS — Z7982 Long term (current) use of aspirin: Secondary | ICD-10-CM | POA: Diagnosis not present

## 2020-09-18 DIAGNOSIS — M8580 Other specified disorders of bone density and structure, unspecified site: Secondary | ICD-10-CM | POA: Diagnosis not present

## 2020-09-18 DIAGNOSIS — Z79899 Other long term (current) drug therapy: Secondary | ICD-10-CM | POA: Diagnosis not present

## 2020-09-18 DIAGNOSIS — C9000 Multiple myeloma not having achieved remission: Secondary | ICD-10-CM | POA: Diagnosis not present

## 2020-09-18 DIAGNOSIS — D709 Neutropenia, unspecified: Secondary | ICD-10-CM | POA: Diagnosis not present

## 2020-09-18 DIAGNOSIS — E119 Type 2 diabetes mellitus without complications: Secondary | ICD-10-CM | POA: Diagnosis not present

## 2020-09-18 DIAGNOSIS — Z5112 Encounter for antineoplastic immunotherapy: Secondary | ICD-10-CM | POA: Diagnosis not present

## 2020-09-18 MED ORDER — LENALIDOMIDE 15 MG PO CAPS: 15.0000 mg | ORAL_CAPSULE | Freq: Every day | ORAL | 0 refills | Status: DC

## 2020-09-18 MED ORDER — DEXAMETHASONE 4 MG PO TABS
ORAL_TABLET | ORAL | Status: AC
  Filled 2020-09-18: qty 1

## 2020-09-18 MED ORDER — DEXAMETHASONE 4 MG PO TABS
20.0000 mg | ORAL_TABLET | Freq: Once | ORAL | Status: AC
  Administered 2020-09-18: 20 mg via ORAL

## 2020-09-18 MED ORDER — BORTEZOMIB CHEMO SQ INJECTION 3.5 MG (2.5MG/ML)
1.3000 mg/m2 | Freq: Once | INTRAMUSCULAR | Status: AC
  Administered 2020-09-18: 2.25 mg via SUBCUTANEOUS
  Filled 2020-09-18: qty 0.9

## 2020-09-18 NOTE — Patient Instructions (Signed)
[facility] Discharge Instructions for Patients Receiving Chemotherapy  Today you received the following chemotherapy agent: Bortezomib (Velcade)  To help prevent nausea and vomiting after your treatment, we encourage you to take your nausea medication as directed by your MD.   If you develop nausea and vomiting that is not controlled by your nausea medication, call the clinic.   BELOW ARE SYMPTOMS THAT SHOULD BE REPORTED IMMEDIATELY:  *FEVER GREATER THAN 100.5 F  *CHILLS WITH OR WITHOUT FEVER  NAUSEA AND VOMITING THAT IS NOT CONTROLLED WITH YOUR NAUSEA MEDICATION  *UNUSUAL SHORTNESS OF BREATH  *UNUSUAL BRUISING OR BLEEDING  TENDERNESS IN MOUTH AND THROAT WITH OR WITHOUT PRESENCE OF ULCERS  *URINARY PROBLEMS  *BOWEL PROBLEMS  UNUSUAL RASH Items with * indicate a potential emergency and should be followed up as soon as possible.  Feel free to call the clinic should you have any questions or concerns. The clinic phone number is [phone].  Please show the CHEMO ALERT CARD at check-in to the Emergency Department and triage nurse.   

## 2020-09-18 NOTE — Telephone Encounter (Signed)
Received faxed refill request for Revlimid 15 mg 14 days on/7days off from [facility] Refilled per Dr. Irene Limbo verbal order

## 2020-09-19 ENCOUNTER — Telehealth: Payer: Self-pay | Admitting: Hematology

## 2020-09-19 NOTE — Telephone Encounter (Signed)
Scheduled follow-up appointments per 3/21 los. Patient is aware.

## 2020-09-26 ENCOUNTER — Other Ambulatory Visit: Payer: Self-pay

## 2020-09-26 DIAGNOSIS — C9000 Multiple myeloma not having achieved remission: Secondary | ICD-10-CM

## 2020-09-30 ENCOUNTER — Inpatient Hospital Stay: Payer: Medicare HMO | Admitting: Hematology

## 2020-09-30 ENCOUNTER — Inpatient Hospital Stay: Payer: Medicare HMO

## 2020-09-30 ENCOUNTER — Inpatient Hospital Stay: Payer: Medicare HMO | Attending: Hematology

## 2020-09-30 ENCOUNTER — Other Ambulatory Visit: Payer: Self-pay

## 2020-09-30 VITALS — BP 134/77 | HR 77 | Temp 98.2°F | Resp 16 | Wt 149.0 lb

## 2020-09-30 DIAGNOSIS — I10 Essential (primary) hypertension: Secondary | ICD-10-CM | POA: Insufficient documentation

## 2020-09-30 DIAGNOSIS — Z79899 Other long term (current) drug therapy: Secondary | ICD-10-CM | POA: Diagnosis not present

## 2020-09-30 DIAGNOSIS — Z7982 Long term (current) use of aspirin: Secondary | ICD-10-CM | POA: Insufficient documentation

## 2020-09-30 DIAGNOSIS — E119 Type 2 diabetes mellitus without complications: Secondary | ICD-10-CM | POA: Insufficient documentation

## 2020-09-30 DIAGNOSIS — E559 Vitamin D deficiency, unspecified: Secondary | ICD-10-CM | POA: Diagnosis not present

## 2020-09-30 DIAGNOSIS — C9000 Multiple myeloma not having achieved remission: Secondary | ICD-10-CM

## 2020-09-30 DIAGNOSIS — Z5111 Encounter for antineoplastic chemotherapy: Secondary | ICD-10-CM

## 2020-09-30 DIAGNOSIS — Z7189 Other specified counseling: Secondary | ICD-10-CM

## 2020-09-30 DIAGNOSIS — Z5112 Encounter for antineoplastic immunotherapy: Secondary | ICD-10-CM | POA: Diagnosis not present

## 2020-09-30 LAB — CBC WITH DIFFERENTIAL (CANCER CENTER ONLY)
Abs Immature Granulocytes: 0 10*3/uL (ref 0.00–0.07)
Basophils Absolute: 0.1 10*3/uL (ref 0.0–0.1)
Basophils Relative: 2 %
Eosinophils Absolute: 0.1 10*3/uL (ref 0.0–0.5)
Eosinophils Relative: 2 %
HCT: 34.8 % — ABNORMAL LOW (ref 36.0–46.0)
Hemoglobin: 11.7 g/dL — ABNORMAL LOW (ref 12.0–15.0)
Immature Granulocytes: 0 %
Lymphocytes Relative: 30 %
Lymphs Abs: 1 10*3/uL (ref 0.7–4.0)
MCH: 34.2 pg — ABNORMAL HIGH (ref 26.0–34.0)
MCHC: 33.6 g/dL (ref 30.0–36.0)
MCV: 101.8 fL — ABNORMAL HIGH (ref 80.0–100.0)
Monocytes Absolute: 0.7 10*3/uL (ref 0.1–1.0)
Monocytes Relative: 19 %
Neutro Abs: 1.6 10*3/uL — ABNORMAL LOW (ref 1.7–7.7)
Neutrophils Relative %: 47 %
Platelet Count: 255 10*3/uL (ref 150–400)
RBC: 3.42 MIL/uL — ABNORMAL LOW (ref 3.87–5.11)
RDW: 15 % (ref 11.5–15.5)
WBC Count: 3.4 10*3/uL — ABNORMAL LOW (ref 4.0–10.5)
nRBC: 0 % (ref 0.0–0.2)

## 2020-09-30 LAB — CMP (CANCER CENTER ONLY)
ALT: 41 U/L (ref 0–44)
AST: 26 U/L (ref 15–41)
Albumin: 3.5 g/dL (ref 3.5–5.0)
Alkaline Phosphatase: 43 U/L (ref 38–126)
Anion gap: 9 (ref 5–15)
BUN: 12 mg/dL (ref 8–23)
CO2: 23 mmol/L (ref 22–32)
Calcium: 8.7 mg/dL — ABNORMAL LOW (ref 8.9–10.3)
Chloride: 111 mmol/L (ref 98–111)
Creatinine: 0.82 mg/dL (ref 0.44–1.00)
GFR, Estimated: >60 mL/min (ref >60)
Glucose, Bld: 98 mg/dL (ref 70–99)
Potassium: 3.7 mmol/L (ref 3.5–5.1)
Sodium: 143 mmol/L (ref 135–145)
Total Bilirubin: 0.4 mg/dL (ref 0.3–1.2)
Total Protein: 7.8 g/dL (ref 6.5–8.1)

## 2020-09-30 MED ORDER — DEXAMETHASONE 4 MG PO TABS
ORAL_TABLET | ORAL | Status: AC
  Filled 2020-09-30: qty 5

## 2020-09-30 MED ORDER — DEXAMETHASONE 4 MG PO TABS
20.0000 mg | ORAL_TABLET | Freq: Once | ORAL | Status: AC
  Administered 2020-09-30: 20 mg via ORAL

## 2020-09-30 MED ORDER — BORTEZOMIB CHEMO SQ INJECTION 3.5 MG (2.5MG/ML)
1.3000 mg/m2 | Freq: Once | INTRAMUSCULAR | Status: AC
  Administered 2020-09-30: 2.25 mg via SUBCUTANEOUS
  Filled 2020-09-30: qty 0.9

## 2020-09-30 NOTE — Patient Instructions (Signed)
[facility] Discharge Instructions for Patients Receiving Chemotherapy  Today you received the following chemotherapy agent: Bortezomib (Velcade)  To help prevent nausea and vomiting after your treatment, we encourage you to take your nausea medication as directed by your MD.   If you develop nausea and vomiting that is not controlled by your nausea medication, call the clinic.   BELOW ARE SYMPTOMS THAT SHOULD BE REPORTED IMMEDIATELY:  *FEVER GREATER THAN 100.5 F  *CHILLS WITH OR WITHOUT FEVER  NAUSEA AND VOMITING THAT IS NOT CONTROLLED WITH YOUR NAUSEA MEDICATION  *UNUSUAL SHORTNESS OF BREATH  *UNUSUAL BRUISING OR BLEEDING  TENDERNESS IN MOUTH AND THROAT WITH OR WITHOUT PRESENCE OF ULCERS  *URINARY PROBLEMS  *BOWEL PROBLEMS  UNUSUAL RASH Items with * indicate a potential emergency and should be followed up as soon as possible.  Feel free to call the clinic should you have any questions or concerns. The clinic phone number is [phone].  Please show the CHEMO ALERT CARD at check-in to the Emergency Department and triage nurse.   

## 2020-10-01 LAB — MULTIPLE MYELOMA PANEL, SERUM
Albumin SerPl Elph-Mcnc: 3.6 g/dL (ref 2.9–4.4)
Albumin/Glob SerPl: 1 (ref 0.7–1.7)
Alpha 1: 0.2 g/dL (ref 0.0–0.4)
Alpha2 Glob SerPl Elph-Mcnc: 0.9 g/dL (ref 0.4–1.0)
B-Globulin SerPl Elph-Mcnc: 0.9 g/dL (ref 0.7–1.3)
Gamma Glob SerPl Elph-Mcnc: 1.9 g/dL — ABNORMAL HIGH (ref 0.4–1.8)
Globulin, Total: 3.9 g/dL (ref 2.2–3.9)
IgA: 13 mg/dL — ABNORMAL LOW (ref 64–422)
IgG (Immunoglobin G), Serum: 1872 mg/dL — ABNORMAL HIGH (ref 586–1602)
IgM (Immunoglobulin M), Srm: 27 mg/dL (ref 26–217)
M Protein SerPl Elph-Mcnc: 1.7 g/dL — ABNORMAL HIGH
Total Protein ELP: 7.5 g/dL (ref 6.0–8.5)

## 2020-10-01 LAB — KAPPA/LAMBDA LIGHT CHAINS
Kappa free light chain: 15 mg/L (ref 3.3–19.4)
Kappa, lambda light chain ratio: 2.27 — ABNORMAL HIGH (ref 0.26–1.65)
Lambda free light chains: 6.6 mg/L (ref 5.7–26.3)

## 2020-10-03 ENCOUNTER — Other Ambulatory Visit: Payer: Self-pay

## 2020-10-03 ENCOUNTER — Inpatient Hospital Stay: Payer: Medicare HMO

## 2020-10-03 VITALS — BP 136/73 | HR 68 | Temp 98.0°F | Resp 20 | Wt 149.0 lb

## 2020-10-03 DIAGNOSIS — Z7189 Other specified counseling: Secondary | ICD-10-CM

## 2020-10-03 DIAGNOSIS — Z79899 Other long term (current) drug therapy: Secondary | ICD-10-CM | POA: Diagnosis not present

## 2020-10-03 DIAGNOSIS — E559 Vitamin D deficiency, unspecified: Secondary | ICD-10-CM | POA: Diagnosis not present

## 2020-10-03 DIAGNOSIS — E119 Type 2 diabetes mellitus without complications: Secondary | ICD-10-CM | POA: Diagnosis not present

## 2020-10-03 DIAGNOSIS — C9000 Multiple myeloma not having achieved remission: Secondary | ICD-10-CM | POA: Diagnosis not present

## 2020-10-03 DIAGNOSIS — Z7982 Long term (current) use of aspirin: Secondary | ICD-10-CM | POA: Diagnosis not present

## 2020-10-03 DIAGNOSIS — I10 Essential (primary) hypertension: Secondary | ICD-10-CM | POA: Diagnosis not present

## 2020-10-03 DIAGNOSIS — Z5112 Encounter for antineoplastic immunotherapy: Secondary | ICD-10-CM | POA: Diagnosis not present

## 2020-10-03 MED ORDER — DEXAMETHASONE 4 MG PO TABS
20.0000 mg | ORAL_TABLET | Freq: Once | ORAL | Status: AC
  Administered 2020-10-03: 20 mg via ORAL

## 2020-10-03 MED ORDER — BORTEZOMIB CHEMO SQ INJECTION 3.5 MG (2.5MG/ML)
1.3000 mg/m2 | Freq: Once | INTRAMUSCULAR | Status: AC
  Administered 2020-10-03: 2.25 mg via SUBCUTANEOUS
  Filled 2020-10-03: qty 0.9

## 2020-10-03 NOTE — Patient Instructions (Signed)
[facility] Discharge Instructions for Patients Receiving Chemotherapy  Today you received the following chemotherapy agent: Bortezomib (Velcade)  To help prevent nausea and vomiting after your treatment, we encourage you to take your nausea medication as directed by your MD.   If you develop nausea and vomiting that is not controlled by your nausea medication, call the clinic.   BELOW ARE SYMPTOMS THAT SHOULD BE REPORTED IMMEDIATELY:  *FEVER GREATER THAN 100.5 F  *CHILLS WITH OR WITHOUT FEVER  NAUSEA AND VOMITING THAT IS NOT CONTROLLED WITH YOUR NAUSEA MEDICATION  *UNUSUAL SHORTNESS OF BREATH  *UNUSUAL BRUISING OR BLEEDING  TENDERNESS IN MOUTH AND THROAT WITH OR WITHOUT PRESENCE OF ULCERS  *URINARY PROBLEMS  *BOWEL PROBLEMS  UNUSUAL RASH Items with * indicate a potential emergency and should be followed up as soon as possible.  Feel free to call the clinic should you have any questions or concerns. The clinic phone number is [phone].  Please show the CHEMO ALERT CARD at check-in to the Emergency Department and triage nurse.   
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
## 2020-10-07 ENCOUNTER — Inpatient Hospital Stay: Payer: Medicare HMO

## 2020-10-07 ENCOUNTER — Other Ambulatory Visit: Payer: Self-pay

## 2020-10-07 ENCOUNTER — Other Ambulatory Visit: Payer: Self-pay | Admitting: Hematology

## 2020-10-07 VITALS — BP 120/60 | HR 76 | Temp 97.8°F | Resp 17

## 2020-10-07 DIAGNOSIS — Z79899 Other long term (current) drug therapy: Secondary | ICD-10-CM | POA: Diagnosis not present

## 2020-10-07 DIAGNOSIS — E559 Vitamin D deficiency, unspecified: Secondary | ICD-10-CM | POA: Diagnosis not present

## 2020-10-07 DIAGNOSIS — I10 Essential (primary) hypertension: Secondary | ICD-10-CM | POA: Diagnosis not present

## 2020-10-07 DIAGNOSIS — E876 Hypokalemia: Secondary | ICD-10-CM

## 2020-10-07 DIAGNOSIS — Z5112 Encounter for antineoplastic immunotherapy: Secondary | ICD-10-CM | POA: Diagnosis not present

## 2020-10-07 DIAGNOSIS — E119 Type 2 diabetes mellitus without complications: Secondary | ICD-10-CM | POA: Diagnosis not present

## 2020-10-07 DIAGNOSIS — Z7189 Other specified counseling: Secondary | ICD-10-CM

## 2020-10-07 DIAGNOSIS — C9000 Multiple myeloma not having achieved remission: Secondary | ICD-10-CM | POA: Diagnosis not present

## 2020-10-07 DIAGNOSIS — Z7982 Long term (current) use of aspirin: Secondary | ICD-10-CM | POA: Diagnosis not present

## 2020-10-07 LAB — CMP (CANCER CENTER ONLY)
ALT: 39 U/L (ref 0–44)
AST: 26 U/L (ref 15–41)
Albumin: 3.4 g/dL — ABNORMAL LOW (ref 3.5–5.0)
Alkaline Phosphatase: 50 U/L (ref 38–126)
Anion gap: 9 (ref 5–15)
BUN: 15 mg/dL (ref 8–23)
CO2: 23 mmol/L (ref 22–32)
Calcium: 8.7 mg/dL — ABNORMAL LOW (ref 8.9–10.3)
Chloride: 109 mmol/L (ref 98–111)
Creatinine: 0.74 mg/dL (ref 0.44–1.00)
GFR, Estimated: >60 mL/min (ref >60)
Glucose, Bld: 96 mg/dL (ref 70–99)
Potassium: 2.9 mmol/L — ABNORMAL LOW (ref 3.5–5.1)
Sodium: 141 mmol/L (ref 135–145)
Total Bilirubin: 0.3 mg/dL (ref 0.3–1.2)
Total Protein: 7.3 g/dL (ref 6.5–8.1)

## 2020-10-07 LAB — CBC WITH DIFFERENTIAL (CANCER CENTER ONLY)
Abs Immature Granulocytes: 0.02 10*3/uL (ref 0.00–0.07)
Basophils Absolute: 0 10*3/uL (ref 0.0–0.1)
Basophils Relative: 2 %
Eosinophils Absolute: 0.1 10*3/uL (ref 0.0–0.5)
Eosinophils Relative: 5 %
HCT: 33.6 % — ABNORMAL LOW (ref 36.0–46.0)
Hemoglobin: 11.6 g/dL — ABNORMAL LOW (ref 12.0–15.0)
Immature Granulocytes: 1 %
Lymphocytes Relative: 34 %
Lymphs Abs: 0.8 10*3/uL (ref 0.7–4.0)
MCH: 34.6 pg — ABNORMAL HIGH (ref 26.0–34.0)
MCHC: 34.5 g/dL (ref 30.0–36.0)
MCV: 100.3 fL — ABNORMAL HIGH (ref 80.0–100.0)
Monocytes Absolute: 0.3 10*3/uL (ref 0.1–1.0)
Monocytes Relative: 12 %
Neutro Abs: 1.1 10*3/uL — ABNORMAL LOW (ref 1.7–7.7)
Neutrophils Relative %: 46 %
Platelet Count: 143 10*3/uL — ABNORMAL LOW (ref 150–400)
RBC: 3.35 MIL/uL — ABNORMAL LOW (ref 3.87–5.11)
RDW: 14.8 % (ref 11.5–15.5)
WBC Count: 2.4 10*3/uL — ABNORMAL LOW (ref 4.0–10.5)
nRBC: 0 % (ref 0.0–0.2)

## 2020-10-07 MED ORDER — DEXAMETHASONE 4 MG PO TABS
20.0000 mg | ORAL_TABLET | Freq: Once | ORAL | Status: AC
  Administered 2020-10-07: 20 mg via ORAL

## 2020-10-07 MED ORDER — DEXAMETHASONE 4 MG PO TABS
ORAL_TABLET | ORAL | Status: AC
  Filled 2020-10-07: qty 5

## 2020-10-07 MED ORDER — POTASSIUM CHLORIDE CRYS ER 20 MEQ PO TBCR
EXTENDED_RELEASE_TABLET | ORAL | Status: AC
  Filled 2020-10-07: qty 2

## 2020-10-07 MED ORDER — BORTEZOMIB CHEMO SQ INJECTION 3.5 MG (2.5MG/ML)
1.3000 mg/m2 | Freq: Once | INTRAMUSCULAR | Status: AC
  Administered 2020-10-07: 2.25 mg via SUBCUTANEOUS
  Filled 2020-10-07: qty 0.9

## 2020-10-07 MED ORDER — POTASSIUM CHLORIDE CRYS ER 20 MEQ PO TBCR: 20.0000 meq | EXTENDED_RELEASE_TABLET | Freq: Two times a day (BID) | ORAL | 1 refills | Status: DC

## 2020-10-07 MED ORDER — POTASSIUM CHLORIDE 20 MEQ PO PACK
40.0000 meq | PACK | Freq: Once | ORAL | Status: DC
  Filled 2020-10-07: qty 2

## 2020-10-07 MED ORDER — POTASSIUM CHLORIDE CRYS ER 20 MEQ PO TBCR
40.0000 meq | EXTENDED_RELEASE_TABLET | Freq: Once | ORAL | Status: AC
  Administered 2020-10-07: 40 meq via ORAL

## 2020-10-07 NOTE — Progress Notes (Signed)
Okay to treat with ANC 1.1 per Dr. Irene Limbo.

## 2020-10-08 LAB — MULTIPLE MYELOMA PANEL, SERUM
Albumin SerPl Elph-Mcnc: 3.6 g/dL (ref 2.9–4.4)
Albumin/Glob SerPl: 1.1 (ref 0.7–1.7)
Alpha 1: 0.2 g/dL (ref 0.0–0.4)
Alpha2 Glob SerPl Elph-Mcnc: 0.8 g/dL (ref 0.4–1.0)
B-Globulin SerPl Elph-Mcnc: 0.9 g/dL (ref 0.7–1.3)
Gamma Glob SerPl Elph-Mcnc: 1.5 g/dL (ref 0.4–1.8)
Globulin, Total: 3.4 g/dL (ref 2.2–3.9)
IgA: 12 mg/dL — ABNORMAL LOW (ref 64–422)
IgG (Immunoglobin G), Serum: 1504 mg/dL (ref 586–1602)
IgM (Immunoglobulin M), Srm: 26 mg/dL (ref 26–217)
M Protein SerPl Elph-Mcnc: 1.4 g/dL — ABNORMAL HIGH
Total Protein ELP: 7 g/dL (ref 6.0–8.5)

## 2020-10-08 LAB — KAPPA/LAMBDA LIGHT CHAINS
Kappa free light chain: 17.1 mg/L (ref 3.3–19.4)
Kappa, lambda light chain ratio: 4.38 — ABNORMAL HIGH (ref 0.26–1.65)
Lambda free light chains: 3.9 mg/L — ABNORMAL LOW (ref 5.7–26.3)

## 2020-10-09 ENCOUNTER — Encounter (HOSPITAL_COMMUNITY): Payer: Self-pay | Admitting: Emergency Medicine

## 2020-10-09 ENCOUNTER — Other Ambulatory Visit: Payer: Self-pay

## 2020-10-09 ENCOUNTER — Emergency Department (HOSPITAL_COMMUNITY)
Admission: EM | Admit: 2020-10-09 | Discharge: 2020-10-09 | Disposition: A | Discharge status: Home / Self Care | Payer: Medicare HMO | Attending: Emergency Medicine | Admitting: Emergency Medicine

## 2020-10-09 DIAGNOSIS — Z79899 Other long term (current) drug therapy: Secondary | ICD-10-CM | POA: Insufficient documentation

## 2020-10-09 DIAGNOSIS — Z7982 Long term (current) use of aspirin: Secondary | ICD-10-CM | POA: Insufficient documentation

## 2020-10-09 DIAGNOSIS — R202 Paresthesia of skin: Secondary | ICD-10-CM | POA: Diagnosis not present

## 2020-10-09 DIAGNOSIS — E119 Type 2 diabetes mellitus without complications: Secondary | ICD-10-CM | POA: Diagnosis not present

## 2020-10-09 DIAGNOSIS — Z8579 Personal history of other malignant neoplasms of lymphoid, hematopoietic and related tissues: Secondary | ICD-10-CM | POA: Insufficient documentation

## 2020-10-09 DIAGNOSIS — I10 Essential (primary) hypertension: Secondary | ICD-10-CM | POA: Diagnosis not present

## 2020-10-09 DIAGNOSIS — Z9221 Personal history of antineoplastic chemotherapy: Secondary | ICD-10-CM | POA: Diagnosis not present

## 2020-10-09 LAB — CBC WITH DIFFERENTIAL/PLATELET
Abs Immature Granulocytes: 0.02 10*3/uL (ref 0.00–0.07)
Basophils Absolute: 0 10*3/uL (ref 0.0–0.1)
Basophils Relative: 0 %
Eosinophils Absolute: 0.2 10*3/uL (ref 0.0–0.5)
Eosinophils Relative: 4 %
HCT: 33 % — ABNORMAL LOW (ref 36.0–46.0)
Hemoglobin: 11 g/dL — ABNORMAL LOW (ref 12.0–15.0)
Immature Granulocytes: 1 %
Lymphocytes Relative: 26 %
Lymphs Abs: 1.1 10*3/uL (ref 0.7–4.0)
MCH: 34.5 pg — ABNORMAL HIGH (ref 26.0–34.0)
MCHC: 33.3 g/dL (ref 30.0–36.0)
MCV: 103.4 fL — ABNORMAL HIGH (ref 80.0–100.0)
Monocytes Absolute: 0.6 10*3/uL (ref 0.1–1.0)
Monocytes Relative: 15 %
Neutro Abs: 2.3 10*3/uL (ref 1.7–7.7)
Neutrophils Relative %: 54 %
Platelets: 98 10*3/uL — ABNORMAL LOW (ref 150–400)
RBC: 3.19 MIL/uL — ABNORMAL LOW (ref 3.87–5.11)
RDW: 15.1 % (ref 11.5–15.5)
WBC: 4.2 10*3/uL (ref 4.0–10.5)
nRBC: 0 % (ref 0.0–0.2)

## 2020-10-09 LAB — BASIC METABOLIC PANEL
Anion gap: 6 (ref 5–15)
BUN: 18 mg/dL (ref 8–23)
CO2: 23 mmol/L (ref 22–32)
Calcium: 8.6 mg/dL — ABNORMAL LOW (ref 8.9–10.3)
Chloride: 108 mmol/L (ref 98–111)
Creatinine, Ser: 0.87 mg/dL (ref 0.44–1.00)
GFR, Estimated: >60 mL/min (ref >60)
Glucose, Bld: 107 mg/dL — ABNORMAL HIGH (ref 70–99)
Potassium: 4.3 mmol/L (ref 3.5–5.1)
Sodium: 137 mmol/L (ref 135–145)

## 2020-10-09 NOTE — ED Notes (Signed)
Pt ambulatory to bathroom

## 2020-10-09 NOTE — Discharge Instructions (Addendum)
Follow-up for chemotherapy tomorrow as scheduled.  Continue taking potassium and all of your other medications.

## 2020-10-09 NOTE — ED Triage Notes (Signed)
Patient presents tingling in her hands and feet. Friday the patient took a steroid. On Saturday the patient had diarrhea the entire day. On Monday she was seen at the cancer center and found she had low potassium. She was given PO potassium.

## 2020-10-09 NOTE — ED Provider Notes (Signed)
[name] DEPT Provider Note   CSN: [id number] Arrival date & time: 10/09/20  1957     History Chief Complaint  Patient presents with  . Foot Pain  . Hand Pain    Tracy Fisher is a 75 y.o. female.  HPI Elderly female presenting for evaluation tingling in her feet, ongoing for 3 days.  Seen by PCP, 2 days ago when she had infusion for multiple myeloma.  Labs at that time indicated hyperkalemia, she was treated with oral potassium.  She is taking the potassium as prescribed.  She is due for another chemotherapy tomorrow.  She denies chest pain, shortness of breath, nausea, vomiting, headache, weakness or dizziness.  There are no other known modifying factors.  Past Medical History:  Diagnosis Date  . Diabetes mellitus without complication ([place])   . Hypertension   . Vocal cord cyst     Patient Active Problem List   Diagnosis Date Noted  . Multiple myeloma ([place]) 08/28/2020  . Osteopenia of multiple sites 11/07/2018  . 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 Counseling regarding advance care planning and goals of care 11/07/2018  . Dysphonia 02/11/2015    History reviewed. No pertinent surgical history.   OB History   No obstetric history on file.     Family History  Problem Relation Age of Onset  . Breast cancer Neg Hx     Social History   Tobacco Use  . Smoking status: Never Smoker  . Smokeless tobacco: Never Used  Vaping Use  . Vaping Use: Never used  Substance Use Topics  . Alcohol use: No  . Drug use: Never    Home Medications Prior to Admission medications   Medication Sig Start Date End Date Taking? Authorizing Provider  acyclovir (ZOVIRAX) 400 MG tablet Take 1 tablet (400 mg total) by mouth 2 (two) times daily. 08/28/20   Brunetta Genera, MD  amLODipine (NORVASC) 5 MG tablet  02/01/15   [provider]  aspirin EC 81 MG tablet Take 81 mg by mouth daily. Swallow whole.    [provider]  atorvastatin (LIPITOR) 20 MG tablet  11/25/16   [provider]  brimonidine (ALPHAGAN) 0.2 % ophthalmic solution Place 1 drop into both eyes 2 (two) times daily.  01/30/18   [provider]  Calcium Carbonate-Vitamin D (CALCIUM + D PO) Take 1 tablet by mouth daily.    [provider]  dexamethasone (DECADRON) 4 MG tablet Take 5 tablets (20 mg) on Day 15 of each treatment cycle. Take with breakfast 08/28/20   Brunetta Genera, MD  dorzolamide-timolol (COSOPT) 22.3-6.8 MG/ML ophthalmic solution Place 1 drop into both eyes 2 (two) times daily.  11/21/16   [provider]  lenalidomide (REVLIMID) 15 MG capsule Take 1 capsule (15 mg total) by mouth daily. Take 14 days on, 7 days off, repeat every 21 days. 09/18/20   Brunetta Genera, MD  LORazepam (ATIVAN) 0.5 MG tablet Take 1 tablet (0.5 mg total) by mouth every 6 (six) hours as needed (Nausea or vomiting). 08/28/20   Brunetta Genera, MD  LUMIGAN 0.01 % SOLN INSTILL 1 DROP INTO EACH EYE ONCE DAILY 07/17/20   [provider]  metoCLOPramide (REGLAN) 10 MG tablet Take 1 tablet (10 mg total) by mouth every 8 (eight) hours as needed for nausea or vomiting. Patient not taking: No sig reported 06/04/18   Orlie Dakin, MD  mirtazapine (REMERON) 15 MG tablet Take 15 mg  by mouth at bedtime.    [provider]  Multiple Vitamin (MULTIVITAMIN) capsule Take by mouth.    [provider]  ondansetron (ZOFRAN) 8 MG tablet Take 1 tablet (8 mg total) by mouth 2 (two) times daily as needed (Nausea or vomiting). 08/28/20   Brunetta Genera, MD  potassium chloride SA (KLOR-CON) 20 MEQ tablet Take 1 tablet (20 mEq total) by mouth 2 (two) times daily. 10/07/20   Brunetta Genera, MD  prochlorperazine (COMPAZINE) 10 MG tablet Take 1 tablet (10 mg total) by mouth every 6 (six) hours as needed (Nausea or vomiting). 08/28/20   Brunetta Genera, MD    Allergies    Sulfa  antibiotics  Review of Systems   Review of Systems  All other systems reviewed and are negative.   Physical Exam Updated Vital Signs BP 109/84 (BP Location: Left Arm)   Pulse 63   Temp 98.3 F (36.8 C) (Oral)   Resp 17   Ht _0  (1.626 m)   Wt 67.6 kg   SpO2 98%   BMI 25.58 kg/m   Physical Exam Vitals and nursing note reviewed.  Constitutional:      General: She is not in acute distress.    Appearance: She is well-developed. She is not ill-appearing, toxic-appearing or diaphoretic.  HENT:     Head: Normocephalic and atraumatic.     Right Ear: External ear normal.     Left Ear: External ear normal.     Mouth/Throat:     Pharynx: No oropharyngeal exudate or posterior oropharyngeal erythema.  Eyes:     Conjunctiva/sclera: Conjunctivae normal.     Pupils: Pupils are equal, round, and reactive to light.  Neck:     Trachea: Phonation normal.  Cardiovascular:     Rate and Rhythm: Normal rate and regular rhythm.     Heart sounds: Normal heart sounds.  Pulmonary:     Effort: Pulmonary effort is normal.     Breath sounds: Normal breath sounds.  Abdominal:     General: There is no distension.     Palpations: Abdomen is soft.     Tenderness: There is no abdominal tenderness.  Musculoskeletal:        General: Normal range of motion.     Cervical back: Normal range of motion and neck supple.  Skin:    General: Skin is warm and dry.  Neurological:     Mental Status: She is alert and oriented to person, place, and time.     Cranial Nerves: No cranial nerve deficit.     Sensory: No sensory deficit.     Motor: No abnormal muscle tone.     Coordination: Coordination normal.     Comments: Normal light touch sensation, hands and feet bilaterally.  Psychiatric:        Mood and Affect: Mood normal.        Behavior: Behavior normal.        Thought Content: Thought content normal.        Judgment: Judgment normal.     ED Results / Procedures / Treatments   Labs (all labs  ordered are listed, but only abnormal results are displayed) Labs Reviewed  BASIC METABOLIC PANEL - Abnormal; Notable for the following components:      Result Value   Glucose, Bld 107 (*)    Calcium 8.6 (*)    All other components within normal limits  CBC WITH DIFFERENTIAL/PLATELET - Abnormal; Notable for the following components:  RBC 3.19 (*)    Hemoglobin 11.0 (*)    HCT 33.0 (*)    MCV 103.4 (*)    MCH 34.5 (*)    Platelets 98 (*)    All other components within normal limits    EKG None  Radiology No results found.  Procedures Procedures   Medications Ordered in ED Medications - No data to display  ED Course  I have reviewed the triage vital signs and the nursing notes.  Pertinent labs & imaging results that were available during my care of the patient were reviewed by me and considered in my medical decision making (see chart for details).    MDM Rules/Calculators/A&P                           Patient Vitals for the past 24 hrs:  BP Temp Temp src Pulse Resp SpO2 Height Weight  10/09/20 2157 109/84 98.3 F (36.8 C) Oral 63 17 98 % _0  (1.626 m) 67.6 kg  10/09/20 2004 (!) 135/94 98.4 F (36.9 C) Oral 77 16 99 % -- --    10:14 PM Reevaluation with update and discussion. After initial assessment and treatment, an updated evaluation reveals she remains comfortable has no further complaints.  Findings discussed and questions answered. Daleen Bo   Medical Decision Making:  This patient is presenting for evaluation of tingling of hands and feet, which does require a range of treatment options, and is a complaint that involves a moderate risk of morbidity and mortality. The differential diagnoses include hyperkalemia, metabolic disorder, side effect from chemotherapy. I decided to review old records, and in summary elderly female being treated for multiple myeloma, chemotherapy infusions for about 1 month.  She is currently being treated for hypokalemia.  I  did not require additional historical information from anyone.  Clinical Laboratory Tests Ordered, included CBC and Metabolic panel. Review indicates normal findings except hemoglobin low, MCV high, platelets low, glucose high, calcium low.   Critical Interventions-clinical evaluation, laboratory testing, observation reassessment  After These Interventions, the Patient was reevaluated and was found stable for discharge.  Patient potassium now normal, up from 2.9.  No evidence for CNS disorder causing tingling.  I suspect she is having a side effect from her chemotherapy medication.  She is stable for discharge with outpatient management by her oncologist as planned.  She can likely proceed with chemotherapy tomorrow.  CRITICAL CARE-no Performed by: Daleen Bo  Nursing Notes Reviewed/ Care Coordinated Applicable Imaging Reviewed Interpretation of Laboratory Data incorporated into ED treatment  The patient appears reasonably screened and/or stabilized for discharge and I doubt any other medical condition or other [facility] requiring further screening, evaluation, or treatment in the ED at this time prior to discharge.  Plan: Home Medications-continue usual; Home Treatments-rest, fluids; return here if the recommended treatment, does not improve the symptoms; Recommended follow up-PCP, oncology as scheduled     Final Clinical Impression(s) / ED Diagnoses Final diagnoses:  Paresthesia    Rx / DC Orders ED Discharge Orders    None       Daleen Bo, MD 10/09/20 2217

## 2020-10-10 ENCOUNTER — Inpatient Hospital Stay: Payer: Medicare HMO

## 2020-10-10 ENCOUNTER — Other Ambulatory Visit: Payer: Self-pay | Admitting: Hematology

## 2020-10-10 MED ORDER — DULOXETINE HCL 30 MG PO CPEP: 30.0000 mg | ORAL_CAPSULE | Freq: Every day | ORAL | 3 refills | Status: DC

## 2020-10-10 MED ORDER — B COMPLEX VITAMINS PO CAPS: 1.0000 | ORAL_CAPSULE | Freq: Every day | ORAL | 11 refills | Status: AC

## 2020-10-10 NOTE — Progress Notes (Signed)
Pt arrived to treatment room complaining of pain, numbness and tingling in her hands and feet. She reports she went to ED on 4/13 due to this change and feeling as though this was "intolerable."  Pt reports she feels unstable when walking. Dr. Irene Limbo notified, treatment held, and he would assess in office while she is at the cancer center today, 4/14.

## 2020-10-10 NOTE — Progress Notes (Unsigned)
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  Seen briefly in clinic. We will cancel her current Velcade appointments due to significant neuropathy early in the course of treatment. Patient has been recommended to start taking vitamin B complex 1 capsule p.o. daily. We will also start the patient on Cymbalta 30 mg p.o. daily for her neuropathic discomfort Return to clinic with Dr. Irene Limbo with labs in 2 weeks to plan further treatment.  Brunetta Genera

## 2020-10-14 ENCOUNTER — Emergency Department (HOSPITAL_COMMUNITY)
Admission: EM | Admit: 2020-10-14 | Discharge: 2020-10-14 | Disposition: A | Discharge status: Home / Self Care | Payer: Medicare HMO | Attending: Emergency Medicine | Admitting: Emergency Medicine

## 2020-10-14 ENCOUNTER — Encounter (HOSPITAL_COMMUNITY): Payer: Self-pay

## 2020-10-14 DIAGNOSIS — R202 Paresthesia of skin: Secondary | ICD-10-CM

## 2020-10-14 DIAGNOSIS — Z7982 Long term (current) use of aspirin: Secondary | ICD-10-CM | POA: Diagnosis not present

## 2020-10-14 DIAGNOSIS — E119 Type 2 diabetes mellitus without complications: Secondary | ICD-10-CM | POA: Insufficient documentation

## 2020-10-14 DIAGNOSIS — Z79899 Other long term (current) drug therapy: Secondary | ICD-10-CM | POA: Diagnosis not present

## 2020-10-14 DIAGNOSIS — I10 Essential (primary) hypertension: Secondary | ICD-10-CM | POA: Diagnosis not present

## 2020-10-14 DIAGNOSIS — R200 Anesthesia of skin: Secondary | ICD-10-CM | POA: Diagnosis not present

## 2020-10-14 LAB — BASIC METABOLIC PANEL
Anion gap: 7 (ref 5–15)
BUN: 19 mg/dL (ref 8–23)
CO2: 22 mmol/L (ref 22–32)
Calcium: 8.6 mg/dL — ABNORMAL LOW (ref 8.9–10.3)
Chloride: 109 mmol/L (ref 98–111)
Creatinine, Ser: 0.65 mg/dL (ref 0.44–1.00)
GFR, Estimated: >60 mL/min (ref >60)
Glucose, Bld: 87 mg/dL (ref 70–99)
Potassium: 3.7 mmol/L (ref 3.5–5.1)
Sodium: 138 mmol/L (ref 135–145)

## 2020-10-14 LAB — CBC WITH DIFFERENTIAL/PLATELET
Abs Immature Granulocytes: 0.03 10*3/uL (ref 0.00–0.07)
Basophils Absolute: 0.1 10*3/uL (ref 0.0–0.1)
Basophils Relative: 1 %
Eosinophils Absolute: 0.2 10*3/uL (ref 0.0–0.5)
Eosinophils Relative: 3 %
HCT: 36.8 % (ref 36.0–46.0)
Hemoglobin: 12.2 g/dL (ref 12.0–15.0)
Immature Granulocytes: 1 %
Lymphocytes Relative: 27 %
Lymphs Abs: 1.6 10*3/uL (ref 0.7–4.0)
MCH: 34.3 pg — ABNORMAL HIGH (ref 26.0–34.0)
MCHC: 33.2 g/dL (ref 30.0–36.0)
MCV: 103.4 fL — ABNORMAL HIGH (ref 80.0–100.0)
Monocytes Absolute: 1.1 10*3/uL — ABNORMAL HIGH (ref 0.1–1.0)
Monocytes Relative: 19 %
Neutro Abs: 2.9 10*3/uL (ref 1.7–7.7)
Neutrophils Relative %: 49 %
Platelets: 158 10*3/uL (ref 150–400)
RBC: 3.56 MIL/uL — ABNORMAL LOW (ref 3.87–5.11)
RDW: 15.5 % (ref 11.5–15.5)
WBC: 5.9 10*3/uL (ref 4.0–10.5)
nRBC: 0 % (ref 0.0–0.2)

## 2020-10-14 LAB — MAGNESIUM: Magnesium: 2.1 mg/dL (ref 1.7–2.4)

## 2020-10-14 NOTE — Discharge Instructions (Signed)
Make sure to take your Cymbalta and the vitamin B complex as prescribed by Dr. Irene Limbo.  Follow-up in the oncology clinic

## 2020-10-14 NOTE — ED Provider Notes (Signed)
[facility] DEPT Provider Note   CSN: [id number] Arrival date & time: 10/14/20  1416     History Chief Complaint  Patient presents with  . Tingling    Tracy Fisher is a 75 y.o. female.  HPI   Pt is undergoing chemotherapy.  Since her infusion last week she was having a burning stinging sensation in her hands and feet.  She feels like her finger tips are sore and swollen. She was started on a medication duloxetine.  Pt was seen in the ED once and also saw her oncologist.  At one time she was told her potassium was low. She has been taking that medication but was then told to stop bc her potassium was normal again. Her sx persisted so she came to the ED.  Past Medical History:  Diagnosis Date  . Diabetes mellitus without complication ([place])   . Hypertension   . Vocal cord cyst     Patient Active Problem List   Diagnosis Date Noted  . Multiple myeloma ([place]) 08/28/2020  . Osteopenia of multiple sites 11/07/2018  . 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 Counseling regarding advance care planning and goals of care 11/07/2018  . Dysphonia 02/11/2015    History reviewed. No pertinent surgical history.   OB History   No obstetric history on file.     Family History  Problem Relation Age of Onset  . Breast cancer Neg Hx     Social History   Tobacco Use  . Smoking status: Never Smoker  . Smokeless tobacco: Never Used  Vaping Use  . Vaping Use: Never used  Substance Use Topics  . Alcohol use: No  . Drug use: Never    Home Medications Prior to Admission medications   Medication Sig Start Date End Date Taking? Authorizing Provider  acyclovir (ZOVIRAX) 400 MG tablet Take 1 tablet (400 mg total) by mouth 2 (two) times daily. 08/28/20   Brunetta Genera, MD  amLODipine (NORVASC) 5 MG tablet  02/01/15   [provider]  aspirin EC 81 MG tablet Take 81 mg by mouth daily. Swallow whole.    [provider]   atorvastatin (LIPITOR) 20 MG tablet  11/25/16   [provider]  b complex vitamins capsule Take 1 capsule by mouth daily. 10/10/20   Brunetta Genera, MD  brimonidine (ALPHAGAN) 0.2 % ophthalmic solution Place 1 drop into both eyes 2 (two) times daily.  01/30/18   [provider]  Calcium Carbonate-Vitamin D (CALCIUM + D PO) Take 1 tablet by mouth daily.    [provider]  dexamethasone (DECADRON) 4 MG tablet Take 5 tablets (20 mg) on Day 15 of each treatment cycle. Take with breakfast 08/28/20   Brunetta Genera, MD  dorzolamide-timolol (COSOPT) 22.3-6.8 MG/ML ophthalmic solution Place 1 drop into both eyes 2 (two) times daily.  11/21/16   [provider]  DULoxetine (CYMBALTA) 30 MG capsule Take 1 capsule (30 mg total) by mouth daily. 10/10/20   Brunetta Genera, MD  lenalidomide (REVLIMID) 15 MG capsule Take 1 capsule (15 mg total) by mouth daily. Take 14 days on, 7 days off, repeat every 21 days. 09/18/20   Brunetta Genera, MD  LORazepam (ATIVAN) 0.5 MG tablet Take 1 tablet (0.5 mg total) by mouth every 6 (six) hours as needed (Nausea or vomiting). 08/28/20   Brunetta Genera, MD  LUMIGAN 0.01 % SOLN INSTILL 1 DROP INTO [facility] EYE ONCE DAILY 07/17/20  [provider]  metoCLOPramide (REGLAN) 10 MG tablet Take 1 tablet (10 mg total) by mouth every 8 (eight) hours as needed for nausea or vomiting. Patient not taking: No sig reported 06/04/18   Orlie Dakin, MD  mirtazapine (REMERON) 15 MG tablet Take 15 mg by mouth at bedtime.    [provider]  Multiple Vitamin (MULTIVITAMIN) capsule Take by mouth.    [provider]  ondansetron (ZOFRAN) 8 MG tablet Take 1 tablet (8 mg total) by mouth 2 (two) times daily as needed (Nausea or vomiting). 08/28/20   Brunetta Genera, MD  potassium chloride SA (KLOR-CON) 20 MEQ tablet Take 1 tablet (20 mEq total) by mouth 2 (two) times daily. 10/07/20   Brunetta Genera, MD   prochlorperazine (COMPAZINE) 10 MG tablet Take 1 tablet (10 mg total) by mouth every 6 (six) hours as needed (Nausea or vomiting). 08/28/20   Brunetta Genera, MD    Allergies    Sulfa antibiotics  Review of Systems   Review of Systems  All other systems reviewed and are negative.   Physical Exam Updated Vital Signs BP (!) 155/99   Pulse 70   Temp 97.8 F (36.6 C) (Oral)   Resp 17   Ht 1.626 m (5' 4")   Wt 67.6 kg   SpO2 100%   BMI 25.58 kg/m   Physical Exam Vitals and nursing note reviewed.  Constitutional:      General: She is not in acute distress.    Appearance: She is well-developed.  HENT:     Head: Normocephalic and atraumatic.     Right Ear: External ear normal.     Left Ear: External ear normal.  Eyes:     General: No scleral icterus.       Right eye: No discharge.        Left eye: No discharge.     Conjunctiva/sclera: Conjunctivae normal.  Neck:     Trachea: No tracheal deviation.  Cardiovascular:     Rate and Rhythm: Normal rate and regular rhythm.  Pulmonary:     Effort: Pulmonary effort is normal. No respiratory distress.     Breath sounds: Normal breath sounds. No stridor. No wheezing or rales.  Abdominal:     General: Bowel sounds are normal. There is no distension.     Palpations: Abdomen is soft.     Tenderness: There is no abdominal tenderness. There is no guarding or rebound.  Musculoskeletal:        General: No tenderness.     Cervical back: Neck supple.     Comments: No edema or erythema noted finger tips or feet, warm well perfused  Skin:    General: Skin is warm and dry.     Findings: No rash.  Neurological:     Mental Status: She is alert.     Cranial Nerves: No cranial nerve deficit (no facial droop, extraocular movements intact, no slurred speech).     Sensory: No sensory deficit.     Motor: No abnormal muscle tone or seizure activity.     Coordination: Coordination normal.     ED Results / Procedures / Treatments    Labs (all labs ordered are listed, but only abnormal results are displayed) Labs Reviewed  CBC WITH DIFFERENTIAL/PLATELET - Abnormal; Notable for the following components:      Result Value   RBC 3.56 (*)    MCV 103.4 (*)    MCH 34.3 (*)    Monocytes Absolute  1.1 (*)    All other components within normal limits  BASIC METABOLIC PANEL - Abnormal; Notable for the following components:   Calcium 8.6 (*)    All other components within normal limits  MAGNESIUM    EKG None  Radiology No results found.  Procedures Procedures   Medications Ordered in ED Medications - No data to display  ED Course  I have reviewed the triage vital signs and the nursing notes.  Pertinent labs & imaging results that were available during my care of the patient were reviewed by me and considered in my medical decision making (see chart for details).  Clinical Course as of 10/14/20 1834  Mon Oct 14, 2020  [place] Labs reviewed.  CBC magnesium and metabolic panel unremarkable [JK]    Clinical Course User Index [JK] Dorie Rank, MD   MDM Rules/Calculators/A&P                          Patient presented to the ED for persistent paresthesias.  Patient has seen her oncologist Dr. Irene Limbo, and her symptoms were felt to be most likely related to the Velcade.  Patient did not feel like the medications were helping her so she had not taken them recently.  Explained to patient that sometimes takes time for this to improve so it is important for her to take her medications.  Otherwise no signs of acute infection.  Electrolytes are otherwise unremarkable.  No focal neurologic deficits to suggest acute CNS injury.  Patient feels comfortable going home and will make sure to take her medications Final Clinical Impression(s) / ED Diagnoses Final diagnoses:  Paresthesia    Rx / DC Orders ED Discharge Orders    None       Dorie Rank, MD 10/14/20 [phone]

## 2020-10-14 NOTE — ED Triage Notes (Signed)
Pt presents with c/o tingling in her hands and feet. Pt reports that she is a cancer pt, currently undergoing infusions. Pt reports she was recently on a steroid but the MD took her off not long after that and she is unsure if that is all related. No neuro deficits noted.

## 2020-10-14 NOTE — ED Triage Notes (Signed)
Emergency Medicine Provider Triage Evaluation Note  Tracy Fisher , a 75 y.o. female  was evaluated in triage.  Pt complains of tingling to bilateral hands and feet x 5 days. She also feels like her feet are heavy and that her hands are swelling. She has hx of multiple myeloma and is currently undergoing chemotherapy which she started [facility] March. She last had Chemo last Monday. She went on Thursday for chemo and mentioned her tingling to her hands and feet. She was seen by her oncologist Dr. Irene Limbo who prescribed her duloxetine without improvement prompting her to come to the ED today.   Review of Systems  Positive: + paresthesias Negative: - headache, - vision changes, - weakness  Physical Exam  BP 128/77 (BP Location: Right Arm)   Pulse 82   Temp 97.8 F (36.6 C) (Oral)   Resp 16   Ht '5\' 4"'  (1.626 m)   Wt 67.6 kg   SpO2 98%   BMI 25.58 kg/m  Gen:   Awake, no distress   HEENT:  Atraumatic  Resp:  Normal effort  Cardiac:  Normal rate  Abd:   Nondistended, nontender  MSK:   Moves extremities without difficulty  Neuro:  Speech clear. Decreased sensation to bilateral hands and feet. Strength 5/5.   Medical Decision Making  Medically screening exam initiated at 3:00 PM.  Appropriate orders placed.  Tracy Fisher was informed that the remainder of the evaluation will be completed by another provider, this initial triage assessment does not replace that evaluation, and the importance of remaining in the ED until their evaluation is complete.  Clinical Impression  75 year old female presenting to the ED with complaints of paresthesias to bilateral hands and feet x 5 days. It appears she was seen in the ED on 04/13 for same with negative work up. She saw her oncologist Dr. Irene Limbo the next day and was prescribed duloxetine without improvement. Unable to see that note. Continues to have symptoms. VSS on arrival. Pt has equal strength however diminished sensation. Remainder of neuro exam  intact. Screening labs ordered at this time.    Eustaquio Maize, PA-C 10/14/20 2213

## 2020-10-15 ENCOUNTER — Other Ambulatory Visit: Payer: Self-pay

## 2020-10-15 DIAGNOSIS — Z7189 Other specified counseling: Secondary | ICD-10-CM

## 2020-10-15 DIAGNOSIS — C9000 Multiple myeloma not having achieved remission: Secondary | ICD-10-CM

## 2020-10-15 MED ORDER — LENALIDOMIDE 15 MG PO CAPS: 15.0000 mg | ORAL_CAPSULE | Freq: Every day | ORAL | 0 refills | Status: DC

## 2020-10-18 ENCOUNTER — Telehealth: Payer: Self-pay

## 2020-10-18 NOTE — Telephone Encounter (Signed)
Returned call to pt regarding numb/ting in hands and feet. Pt states she has an appointment with Dr Irene Limbo on Tuesday and she wants him to be aware. She wants to discuss this with him at her appointment . Pt informed Dr Irene Limbo would be made aware.
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
## 2020-10-22 ENCOUNTER — Other Ambulatory Visit: Payer: Self-pay | Admitting: *Deleted

## 2020-10-22 ENCOUNTER — Other Ambulatory Visit: Payer: Self-pay

## 2020-10-22 ENCOUNTER — Ambulatory Visit: Payer: Medicare HMO

## 2020-10-22 ENCOUNTER — Other Ambulatory Visit (HOSPITAL_COMMUNITY): Payer: Self-pay

## 2020-10-22 ENCOUNTER — Inpatient Hospital Stay: Payer: Medicare HMO

## 2020-10-22 ENCOUNTER — Inpatient Hospital Stay: Payer: Medicare HMO | Admitting: Hematology

## 2020-10-22 VITALS — BP 132/95 | HR 74 | Temp 97.9°F | Resp 18 | Ht 64.0 in | Wt 147.6 lb

## 2020-10-22 DIAGNOSIS — Z7982 Long term (current) use of aspirin: Secondary | ICD-10-CM | POA: Diagnosis not present

## 2020-10-22 DIAGNOSIS — I10 Essential (primary) hypertension: Secondary | ICD-10-CM | POA: Diagnosis not present

## 2020-10-22 DIAGNOSIS — E559 Vitamin D deficiency, unspecified: Secondary | ICD-10-CM | POA: Diagnosis not present

## 2020-10-22 DIAGNOSIS — C9000 Multiple myeloma not having achieved remission: Secondary | ICD-10-CM | POA: Diagnosis not present

## 2020-10-22 DIAGNOSIS — Z79899 Other long term (current) drug therapy: Secondary | ICD-10-CM | POA: Diagnosis not present

## 2020-10-22 DIAGNOSIS — E119 Type 2 diabetes mellitus without complications: Secondary | ICD-10-CM | POA: Diagnosis not present

## 2020-10-22 DIAGNOSIS — Z7189 Other specified counseling: Secondary | ICD-10-CM

## 2020-10-22 DIAGNOSIS — Z5112 Encounter for antineoplastic immunotherapy: Secondary | ICD-10-CM | POA: Diagnosis not present

## 2020-10-22 LAB — CBC WITH DIFFERENTIAL (CANCER CENTER ONLY)
Abs Immature Granulocytes: 0 10*3/uL (ref 0.00–0.07)
Basophils Absolute: 0.1 10*3/uL (ref 0.0–0.1)
Basophils Relative: 2 %
Eosinophils Absolute: 0.1 10*3/uL (ref 0.0–0.5)
Eosinophils Relative: 3 %
HCT: 34.4 % — ABNORMAL LOW (ref 36.0–46.0)
Hemoglobin: 11.8 g/dL — ABNORMAL LOW (ref 12.0–15.0)
Immature Granulocytes: 0 %
Lymphocytes Relative: 29 %
Lymphs Abs: 1.2 10*3/uL (ref 0.7–4.0)
MCH: 34.5 pg — ABNORMAL HIGH (ref 26.0–34.0)
MCHC: 34.3 g/dL (ref 30.0–36.0)
MCV: 100.6 fL — ABNORMAL HIGH (ref 80.0–100.0)
Monocytes Absolute: 0.7 10*3/uL (ref 0.1–1.0)
Monocytes Relative: 18 %
Neutro Abs: 2 10*3/uL (ref 1.7–7.7)
Neutrophils Relative %: 48 %
Platelet Count: 265 10*3/uL (ref 150–400)
RBC: 3.42 MIL/uL — ABNORMAL LOW (ref 3.87–5.11)
RDW: 15 % (ref 11.5–15.5)
WBC Count: 4.2 10*3/uL (ref 4.0–10.5)
nRBC: 0 % (ref 0.0–0.2)

## 2020-10-22 LAB — CMP (CANCER CENTER ONLY)
ALT: 32 U/L (ref 0–44)
AST: 29 U/L (ref 15–41)
Albumin: 3.8 g/dL (ref 3.5–5.0)
Alkaline Phosphatase: 51 U/L (ref 38–126)
Anion gap: 9 (ref 5–15)
BUN: 15 mg/dL (ref 8–23)
CO2: 26 mmol/L (ref 22–32)
Calcium: 9.7 mg/dL (ref 8.9–10.3)
Chloride: 110 mmol/L (ref 98–111)
Creatinine: 0.76 mg/dL (ref 0.44–1.00)
GFR, Estimated: >60 mL/min (ref >60)
Glucose, Bld: 89 mg/dL (ref 70–99)
Potassium: 3.9 mmol/L (ref 3.5–5.1)
Sodium: 145 mmol/L (ref 135–145)
Total Bilirubin: 0.3 mg/dL (ref 0.3–1.2)
Total Protein: 7.5 g/dL (ref 6.5–8.1)

## 2020-10-22 MED ORDER — DULOXETINE HCL 60 MG PO CPEP
60.0000 mg | ORAL_CAPSULE | Freq: Every day | ORAL | 2 refills | Status: DC
  Filled 2020-10-22: qty 30, 30d supply, fill #0
  Filled 2020-11-18: qty 30, 30d supply, fill #1
  Filled 2020-12-18: qty 30, 30d supply, fill #2

## 2020-10-22 MED ORDER — HYDROCODONE-ACETAMINOPHEN 5-325 MG PO TABS
1.0000 | ORAL_TABLET | Freq: Four times a day (QID) | ORAL | 0 refills | Status: DC | PRN
  Filled 2020-10-22: qty 30, 8d supply, fill #0

## 2020-10-22 MED ORDER — LENALIDOMIDE 15 MG PO CAPS: 15.0000 mg | ORAL_CAPSULE | Freq: Every day | ORAL | 1 refills | Status: DC

## 2020-10-23 ENCOUNTER — Telehealth: Payer: Self-pay | Admitting: *Deleted

## 2020-10-23 ENCOUNTER — Telehealth: Payer: Self-pay | Admitting: Hematology

## 2020-10-23 LAB — KAPPA/LAMBDA LIGHT CHAINS
Kappa free light chain: 15.9 mg/L (ref 3.3–19.4)
Kappa, lambda light chain ratio: 2.21 — ABNORMAL HIGH (ref 0.26–1.65)
Lambda free light chains: 7.2 mg/L (ref 5.7–26.3)

## 2020-10-23 NOTE — Telephone Encounter (Signed)
Patient called about medication changes made in appt on 4/26 for Revlimid. Patient thought Dr. Irene Limbo had instructed her to spot this medication. Patient advised that per OV note Dr. Irene Limbo changed medication schedule to 21 days on and 7 days off - dose remains 15 mg.  Informed patient that new prescription ordered via [facility] and that she should receive via mail order in next few days. She should finish current prescription, take 7 days off and restart with new prescription of 21 day supply. Patient verbalized understanding.

## 2020-10-23 NOTE — Telephone Encounter (Signed)
Scheduled appt per 4/26 los. Pt aware.  

## 2020-10-24 ENCOUNTER — Telehealth: Payer: Self-pay | Admitting: *Deleted

## 2020-10-24 NOTE — Telephone Encounter (Signed)
Contacted by Oley Balm with [facility] pharmacy to confirm Dr. Irene Limbo changed Revlimid 15 mg from 14 days on/7 days off to  21 days on/7 days off as they had received new Rx. Confirmed change.

## 2020-10-25 ENCOUNTER — Ambulatory Visit: Payer: Medicare HMO

## 2020-10-25 LAB — MULTIPLE MYELOMA PANEL, SERUM
Albumin SerPl Elph-Mcnc: 3.9 g/dL (ref 2.9–4.4)
Albumin/Glob SerPl: 1.2 (ref 0.7–1.7)
Alpha 1: 0.2 g/dL (ref 0.0–0.4)
Alpha2 Glob SerPl Elph-Mcnc: 0.8 g/dL (ref 0.4–1.0)
B-Globulin SerPl Elph-Mcnc: 1 g/dL (ref 0.7–1.3)
Gamma Glob SerPl Elph-Mcnc: 1.3 g/dL (ref 0.4–1.8)
Globulin, Total: 3.3 g/dL (ref 2.2–3.9)
IgA: 21 mg/dL — ABNORMAL LOW (ref 64–422)
IgG (Immunoglobin G), Serum: 1340 mg/dL (ref 586–1602)
IgM (Immunoglobulin M), Srm: 34 mg/dL (ref 26–217)
M Protein SerPl Elph-Mcnc: 1.2 g/dL — ABNORMAL HIGH
Total Protein ELP: 7.2 g/dL (ref 6.0–8.5)

## 2020-10-28 ENCOUNTER — Ambulatory Visit: Payer: Medicare HMO

## 2020-10-28 ENCOUNTER — Other Ambulatory Visit: Payer: Medicare HMO

## 2020-10-29 ENCOUNTER — Telehealth: Payer: Self-pay

## 2020-10-29 NOTE — Telephone Encounter (Signed)
Returned call to pt. Pt wanted to verify she is taking Revlimid correctly. Reviewed how to take and pt verbalized understanding.

## 2020-10-31 ENCOUNTER — Ambulatory Visit: Payer: Medicare HMO

## 2020-10-31 ENCOUNTER — Other Ambulatory Visit: Payer: Self-pay | Admitting: Hematology

## 2020-10-31 ENCOUNTER — Other Ambulatory Visit (HOSPITAL_COMMUNITY): Payer: Self-pay

## 2020-11-01 ENCOUNTER — Other Ambulatory Visit: Payer: Self-pay | Admitting: Hematology

## 2020-11-01 ENCOUNTER — Other Ambulatory Visit (HOSPITAL_COMMUNITY): Payer: Self-pay

## 2020-11-01 ENCOUNTER — Other Ambulatory Visit: Payer: Self-pay

## 2020-11-01 DIAGNOSIS — C9000 Multiple myeloma not having achieved remission: Secondary | ICD-10-CM

## 2020-11-01 MED ORDER — HYDROCODONE-ACETAMINOPHEN 5-325 MG PO TABS: 1.0000 | ORAL_TABLET | Freq: Four times a day (QID) | ORAL | 0 refills | Status: DC | PRN

## 2020-11-02 ENCOUNTER — Other Ambulatory Visit (HOSPITAL_COMMUNITY): Payer: Self-pay

## 2020-11-04 ENCOUNTER — Other Ambulatory Visit (HOSPITAL_COMMUNITY): Payer: Self-pay

## 2020-11-04 MED ORDER — HYDROCODONE-ACETAMINOPHEN 5-325 MG PO TABS
1.0000 | ORAL_TABLET | Freq: Four times a day (QID) | ORAL | 0 refills | Status: DC | PRN
  Filled 2020-11-04 – 2020-12-26 (×2): qty 60, 15d supply, fill #0

## 2020-11-06 ENCOUNTER — Other Ambulatory Visit (HOSPITAL_COMMUNITY): Payer: Self-pay

## 2020-11-08 ENCOUNTER — Other Ambulatory Visit (HOSPITAL_COMMUNITY): Payer: Self-pay

## 2020-11-12 ENCOUNTER — Other Ambulatory Visit: Payer: Self-pay | Admitting: *Deleted

## 2020-11-12 DIAGNOSIS — C9000 Multiple myeloma not having achieved remission: Secondary | ICD-10-CM

## 2020-11-12 DIAGNOSIS — Z7189 Other specified counseling: Secondary | ICD-10-CM

## 2020-11-12 MED ORDER — LENALIDOMIDE 15 MG PO CAPS: 15.0000 mg | ORAL_CAPSULE | Freq: Every day | ORAL | 1 refills | Status: DC

## 2020-11-15 DIAGNOSIS — C9000 Multiple myeloma not having achieved remission: Secondary | ICD-10-CM | POA: Diagnosis not present

## 2020-11-15 DIAGNOSIS — R69 Illness, unspecified: Secondary | ICD-10-CM | POA: Diagnosis not present

## 2020-11-15 DIAGNOSIS — E1169 Type 2 diabetes mellitus with other specified complication: Secondary | ICD-10-CM | POA: Diagnosis not present

## 2020-11-15 DIAGNOSIS — T451X5A Adverse effect of antineoplastic and immunosuppressive drugs, initial encounter: Secondary | ICD-10-CM | POA: Diagnosis not present

## 2020-11-15 DIAGNOSIS — G620 Drug-induced polyneuropathy: Secondary | ICD-10-CM | POA: Diagnosis not present

## 2020-11-15 DIAGNOSIS — E785 Hyperlipidemia, unspecified: Secondary | ICD-10-CM | POA: Diagnosis not present

## 2020-11-15 DIAGNOSIS — I10 Essential (primary) hypertension: Secondary | ICD-10-CM | POA: Diagnosis not present

## 2020-11-18 ENCOUNTER — Other Ambulatory Visit (HOSPITAL_COMMUNITY): Payer: Self-pay

## 2020-11-19 ENCOUNTER — Other Ambulatory Visit (HOSPITAL_COMMUNITY): Payer: Self-pay

## 2020-11-19 ENCOUNTER — Encounter: Payer: Self-pay | Admitting: Hematology

## 2020-11-19 ENCOUNTER — Other Ambulatory Visit: Payer: Self-pay | Admitting: Family Medicine

## 2020-11-19 DIAGNOSIS — Z1231 Encounter for screening mammogram for malignant neoplasm of breast: Secondary | ICD-10-CM

## 2020-11-20 DIAGNOSIS — I10 Essential (primary) hypertension: Secondary | ICD-10-CM | POA: Diagnosis not present

## 2020-11-20 DIAGNOSIS — E785 Hyperlipidemia, unspecified: Secondary | ICD-10-CM | POA: Diagnosis not present

## 2020-11-20 DIAGNOSIS — K219 Gastro-esophageal reflux disease without esophagitis: Secondary | ICD-10-CM | POA: Diagnosis not present

## 2020-11-20 DIAGNOSIS — E1169 Type 2 diabetes mellitus with other specified complication: Secondary | ICD-10-CM | POA: Diagnosis not present

## 2020-11-20 DIAGNOSIS — R69 Illness, unspecified: Secondary | ICD-10-CM | POA: Diagnosis not present

## 2020-11-21 ENCOUNTER — Other Ambulatory Visit (HOSPITAL_COMMUNITY): Payer: Self-pay
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
## 2020-11-22 ENCOUNTER — Other Ambulatory Visit: Payer: Self-pay

## 2020-11-22 ENCOUNTER — Encounter: Payer: Self-pay | Admitting: Hematology

## 2020-11-22 ENCOUNTER — Inpatient Hospital Stay: Payer: Medicare HMO | Attending: Hematology

## 2020-11-22 ENCOUNTER — Inpatient Hospital Stay (HOSPITAL_BASED_OUTPATIENT_CLINIC_OR_DEPARTMENT_OTHER): Payer: Medicare HMO | Admitting: Hematology

## 2020-11-22 ENCOUNTER — Other Ambulatory Visit: Payer: Self-pay | Admitting: *Deleted

## 2020-11-22 ENCOUNTER — Other Ambulatory Visit (HOSPITAL_COMMUNITY): Payer: Self-pay

## 2020-11-22 VITALS — BP 131/79 | HR 71 | Temp 97.5°F | Resp 18 | Ht 64.0 in | Wt 148.0 lb

## 2020-11-22 DIAGNOSIS — C9000 Multiple myeloma not having achieved remission: Secondary | ICD-10-CM | POA: Insufficient documentation

## 2020-11-22 DIAGNOSIS — Z7189 Other specified counseling: Secondary | ICD-10-CM

## 2020-11-22 LAB — CMP (CANCER CENTER ONLY)
ALT: 25 U/L (ref 0–44)
AST: 25 U/L (ref 15–41)
Albumin: 3.8 g/dL (ref 3.5–5.0)
Alkaline Phosphatase: 59 U/L (ref 38–126)
Anion gap: 9 (ref 5–15)
BUN: 16 mg/dL (ref 8–23)
CO2: 25 mmol/L (ref 22–32)
Calcium: 9.5 mg/dL (ref 8.9–10.3)
Chloride: 105 mmol/L (ref 98–111)
Creatinine: 0.81 mg/dL (ref 0.44–1.00)
GFR, Estimated: >60 mL/min (ref >60)
Glucose, Bld: 128 mg/dL — ABNORMAL HIGH (ref 70–99)
Potassium: 4.2 mmol/L (ref 3.5–5.1)
Sodium: 139 mmol/L (ref 135–145)
Total Bilirubin: 0.3 mg/dL (ref 0.3–1.2)
Total Protein: 7.4 g/dL (ref 6.5–8.1)

## 2020-11-22 LAB — CBC WITH DIFFERENTIAL (CANCER CENTER ONLY)
Abs Immature Granulocytes: 0.01 10*3/uL (ref 0.00–0.07)
Basophils Absolute: 0.1 10*3/uL (ref 0.0–0.1)
Basophils Relative: 1 %
Eosinophils Absolute: 0.1 10*3/uL (ref 0.0–0.5)
Eosinophils Relative: 3 %
HCT: 37.6 % (ref 36.0–46.0)
Hemoglobin: 12.5 g/dL (ref 12.0–15.0)
Immature Granulocytes: 0 %
Lymphocytes Relative: 55 %
Lymphs Abs: 2.3 10*3/uL (ref 0.7–4.0)
MCH: 33.2 pg (ref 26.0–34.0)
MCHC: 33.2 g/dL (ref 30.0–36.0)
MCV: 99.7 fL (ref 80.0–100.0)
Monocytes Absolute: 0.6 10*3/uL (ref 0.1–1.0)
Monocytes Relative: 14 %
Neutro Abs: 1.1 10*3/uL — ABNORMAL LOW (ref 1.7–7.7)
Neutrophils Relative %: 27 %
Platelet Count: 203 10*3/uL (ref 150–400)
RBC: 3.77 MIL/uL — ABNORMAL LOW (ref 3.87–5.11)
RDW: 13.6 % (ref 11.5–15.5)
WBC Count: 4.2 10*3/uL (ref 4.0–10.5)
nRBC: 0 % (ref 0.0–0.2)

## 2020-11-22 MED ORDER — GABAPENTIN 100 MG PO CAPS
200.0000 mg | ORAL_CAPSULE | Freq: Three times a day (TID) | ORAL | 2 refills | Status: DC
  Filled 2020-11-22: qty 90, 15d supply, fill #0

## 2020-11-22 MED ORDER — ACYCLOVIR 400 MG PO TABS
400.0000 mg | ORAL_TABLET | Freq: Two times a day (BID) | ORAL | 3 refills | Status: DC
  Filled 2020-11-22: qty 60, 30d supply, fill #0
  Filled 2020-12-18: qty 60, 30d supply, fill #1

## 2020-11-26 ENCOUNTER — Telehealth: Payer: Self-pay | Admitting: Hematology

## 2020-11-26 ENCOUNTER — Other Ambulatory Visit: Payer: Self-pay

## 2020-11-26 ENCOUNTER — Encounter: Payer: Self-pay | Admitting: Hematology

## 2020-11-26 ENCOUNTER — Other Ambulatory Visit (HOSPITAL_COMMUNITY): Payer: Self-pay

## 2020-11-26 DIAGNOSIS — C9000 Multiple myeloma not having achieved remission: Secondary | ICD-10-CM

## 2020-11-26 LAB — KAPPA/LAMBDA LIGHT CHAINS
Kappa free light chain: 19 mg/L (ref 3.3–19.4)
Kappa, lambda light chain ratio: 2.04 — ABNORMAL HIGH (ref 0.26–1.65)
Lambda free light chains: 9.3 mg/L (ref 5.7–26.3)

## 2020-11-26 MED ORDER — GABAPENTIN 300 MG PO CAPS
300.0000 mg | ORAL_CAPSULE | Freq: Three times a day (TID) | ORAL | 2 refills | Status: DC
  Filled 2020-11-26: qty 90, 30d supply, fill #0
  Filled 2020-12-23: qty 90, 30d supply, fill #1
  Filled 2021-01-31: qty 90, 30d supply, fill #2

## 2020-11-26 NOTE — Telephone Encounter (Signed)
Scheduled follow-up appointment per 5/27 los. Patient is aware. 

## 2020-11-27 LAB — MULTIPLE MYELOMA PANEL, SERUM
Albumin SerPl Elph-Mcnc: 3.8 g/dL (ref 2.9–4.4)
Albumin/Glob SerPl: 1.3 (ref 0.7–1.7)
Alpha 1: 0.2 g/dL (ref 0.0–0.4)
Alpha2 Glob SerPl Elph-Mcnc: 0.8 g/dL (ref 0.4–1.0)
B-Globulin SerPl Elph-Mcnc: 0.8 g/dL (ref 0.7–1.3)
Gamma Glob SerPl Elph-Mcnc: 1.2 g/dL (ref 0.4–1.8)
Globulin, Total: 3.1 g/dL (ref 2.2–3.9)
IgA: 30 mg/dL — ABNORMAL LOW (ref 64–422)
IgG (Immunoglobin G), Serum: 1333 mg/dL (ref 586–1602)
IgM (Immunoglobulin M), Srm: 35 mg/dL (ref 26–217)
M Protein SerPl Elph-Mcnc: 1 g/dL — ABNORMAL HIGH
Total Protein ELP: 6.9 g/dL (ref 6.0–8.5)

## 2020-11-28 ENCOUNTER — Other Ambulatory Visit (HOSPITAL_COMMUNITY): Payer: Self-pay

## 2020-11-28 ENCOUNTER — Telehealth: Payer: Self-pay | Admitting: *Deleted

## 2020-11-28 NOTE — Telephone Encounter (Signed)
CoverMyMeds  prior authorization [id number] for Gabapentin 100 mg capsules "PA Follow Up" request received today.  No initial request received by this nurse.  Renewal created; this Key "Expired two days ago".  Connected with [facility] Outpatient pharmacist Aaron Edelman.  Notified of approval effective 06/29/2020 through 06/28/2021.   "Authorization noted through 06/28/2021.  Currently unable to run order; will continue working with this request."

## 2020-11-29 ENCOUNTER — Other Ambulatory Visit: Payer: Self-pay

## 2020-11-29 ENCOUNTER — Encounter: Payer: Self-pay | Admitting: Pharmacist

## 2020-11-29 ENCOUNTER — Encounter: Payer: Self-pay | Admitting: Hematology

## 2020-11-29 ENCOUNTER — Other Ambulatory Visit (HOSPITAL_COMMUNITY): Payer: Self-pay

## 2020-11-29 DIAGNOSIS — C9000 Multiple myeloma not having achieved remission: Secondary | ICD-10-CM

## 2020-11-29 DIAGNOSIS — Z7189 Other specified counseling: Secondary | ICD-10-CM

## 2020-11-29 MED ORDER — LENALIDOMIDE 25 MG PO CAPS: 25.0000 mg | ORAL_CAPSULE | Freq: Every day | ORAL | 3 refills | Status: DC

## 2020-11-29 MED ORDER — LENALIDOMIDE 25 MG PO CAPS
25.0000 mg | ORAL_CAPSULE | Freq: Every day | ORAL | 3 refills | Status: DC
Start: 2020-11-29 — End: 2020-12-12

## 2020-11-29 MED ORDER — LENALIDOMIDE 25 MG PO CAPS
25.0000 mg | ORAL_CAPSULE | Freq: Every day | ORAL | 3 refills | Status: DC
  Filled 2020-11-29: qty 21, 21d supply, fill #0

## 2020-11-29 NOTE — Progress Notes (Signed)
Erroneous encounter

## 2020-12-02 ENCOUNTER — Other Ambulatory Visit (HOSPITAL_COMMUNITY): Payer: Self-pay

## 2020-12-03 ENCOUNTER — Telehealth: Payer: Self-pay

## 2020-12-03 NOTE — Telephone Encounter (Signed)
Returned call to pt. Informed ok to get her teeth cleaned. Dentist to send clearance form to Dr Irene Limbo if extensive treatment needed. Pt verbalized understanding.

## 2020-12-09 ENCOUNTER — Other Ambulatory Visit (HOSPITAL_COMMUNITY): Payer: Self-pay

## 2020-12-12 ENCOUNTER — Other Ambulatory Visit: Payer: Self-pay

## 2020-12-12 DIAGNOSIS — Z7189 Other specified counseling: Secondary | ICD-10-CM

## 2020-12-12 DIAGNOSIS — C9000 Multiple myeloma not having achieved remission: Secondary | ICD-10-CM

## 2020-12-12 MED ORDER — LENALIDOMIDE 25 MG PO CAPS: 25.0000 mg | ORAL_CAPSULE | Freq: Every day | ORAL | 3 refills | Status: DC

## 2020-12-18 ENCOUNTER — Other Ambulatory Visit (HOSPITAL_COMMUNITY): Payer: Self-pay

## 2020-12-18 ENCOUNTER — Encounter: Payer: Self-pay | Admitting: Hematology

## 2020-12-23 ENCOUNTER — Other Ambulatory Visit (HOSPITAL_COMMUNITY): Payer: Self-pay

## 2020-12-25 ENCOUNTER — Encounter: Payer: Self-pay | Admitting: Hematology

## 2020-12-25 ENCOUNTER — Other Ambulatory Visit (HOSPITAL_COMMUNITY): Payer: Self-pay

## 2020-12-26 ENCOUNTER — Other Ambulatory Visit: Payer: Self-pay | Admitting: Hematology

## 2020-12-26 ENCOUNTER — Other Ambulatory Visit (HOSPITAL_COMMUNITY): Payer: Self-pay

## 2020-12-27 ENCOUNTER — Other Ambulatory Visit (HOSPITAL_COMMUNITY): Payer: Self-pay

## 2020-12-27 ENCOUNTER — Encounter: Payer: Self-pay | Admitting: Hematology

## 2021-01-01 ENCOUNTER — Other Ambulatory Visit: Payer: Self-pay

## 2021-01-01 DIAGNOSIS — C9000 Multiple myeloma not having achieved remission: Secondary | ICD-10-CM
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
## 2021-01-02 ENCOUNTER — Inpatient Hospital Stay: Payer: Medicare HMO | Attending: Hematology | Admitting: Hematology

## 2021-01-02 ENCOUNTER — Inpatient Hospital Stay: Payer: Medicare HMO

## 2021-01-02 ENCOUNTER — Other Ambulatory Visit: Payer: Self-pay

## 2021-01-02 VITALS — BP 127/81 | HR 75 | Temp 98.1°F | Resp 18 | Wt 152.4 lb

## 2021-01-02 DIAGNOSIS — Z7982 Long term (current) use of aspirin: Secondary | ICD-10-CM | POA: Diagnosis not present

## 2021-01-02 DIAGNOSIS — C9000 Multiple myeloma not having achieved remission: Secondary | ICD-10-CM

## 2021-01-02 DIAGNOSIS — Z7189 Other specified counseling: Secondary | ICD-10-CM | POA: Diagnosis not present

## 2021-01-02 DIAGNOSIS — Z79899 Other long term (current) drug therapy: Secondary | ICD-10-CM | POA: Diagnosis not present

## 2021-01-02 DIAGNOSIS — Z298 Encounter for other specified prophylactic measures: Secondary | ICD-10-CM | POA: Diagnosis not present

## 2021-01-02 DIAGNOSIS — D709 Neutropenia, unspecified: Secondary | ICD-10-CM | POA: Insufficient documentation

## 2021-01-02 LAB — CBC WITH DIFFERENTIAL (CANCER CENTER ONLY)
Abs Immature Granulocytes: 0 10*3/uL (ref 0.00–0.07)
Basophils Absolute: 0.1 10*3/uL (ref 0.0–0.1)
Basophils Relative: 2 %
Eosinophils Absolute: 0.2 10*3/uL (ref 0.0–0.5)
Eosinophils Relative: 6 %
HCT: 36.4 % (ref 36.0–46.0)
Hemoglobin: 12.3 g/dL (ref 12.0–15.0)
Immature Granulocytes: 0 %
Lymphocytes Relative: 35 %
Lymphs Abs: 1 10*3/uL (ref 0.7–4.0)
MCH: 32.8 pg (ref 26.0–34.0)
MCHC: 33.8 g/dL (ref 30.0–36.0)
MCV: 97.1 fL (ref 80.0–100.0)
Monocytes Absolute: 0.3 10*3/uL (ref 0.1–1.0)
Monocytes Relative: 11 %
Neutro Abs: 1.3 10*3/uL — ABNORMAL LOW (ref 1.7–7.7)
Neutrophils Relative %: 46 %
Platelet Count: 158 10*3/uL (ref 150–400)
RBC: 3.75 MIL/uL — ABNORMAL LOW (ref 3.87–5.11)
RDW: 13.7 % (ref 11.5–15.5)
WBC Count: 2.9 10*3/uL — ABNORMAL LOW (ref 4.0–10.5)
nRBC: 0 % (ref 0.0–0.2)

## 2021-01-02 LAB — CMP (CANCER CENTER ONLY)
ALT: 27 U/L (ref 0–44)
AST: 24 U/L (ref 15–41)
Albumin: 3.8 g/dL (ref 3.5–5.0)
Alkaline Phosphatase: 74 U/L (ref 38–126)
Anion gap: 8 (ref 5–15)
BUN: 16 mg/dL (ref 8–23)
CO2: 26 mmol/L (ref 22–32)
Calcium: 9.4 mg/dL (ref 8.9–10.3)
Chloride: 108 mmol/L (ref 98–111)
Creatinine: 0.75 mg/dL (ref 0.44–1.00)
GFR, Estimated: >60 mL/min (ref >60)
Glucose, Bld: 97 mg/dL (ref 70–99)
Potassium: 4.2 mmol/L (ref 3.5–5.1)
Sodium: 142 mmol/L (ref 135–145)
Total Bilirubin: 0.3 mg/dL (ref 0.3–1.2)
Total Protein: 7.5 g/dL (ref 6.5–8.1)

## 2021-01-05 LAB — KAPPA/LAMBDA LIGHT CHAINS
Kappa free light chain: 24 mg/L — ABNORMAL HIGH (ref 3.3–19.4)
Kappa, lambda light chain ratio: 2.05 — ABNORMAL HIGH (ref 0.26–1.65)
Lambda free light chains: 11.7 mg/L (ref 5.7–26.3)

## 2021-01-06 LAB — MULTIPLE MYELOMA PANEL, SERUM
Albumin SerPl Elph-Mcnc: 3.9 g/dL (ref 2.9–4.4)
Albumin/Glob SerPl: 1.4 (ref 0.7–1.7)
Alpha 1: 0.2 g/dL (ref 0.0–0.4)
Alpha2 Glob SerPl Elph-Mcnc: 0.7 g/dL (ref 0.4–1.0)
B-Globulin SerPl Elph-Mcnc: 0.8 g/dL (ref 0.7–1.3)
Gamma Glob SerPl Elph-Mcnc: 1.3 g/dL (ref 0.4–1.8)
Globulin, Total: 3 g/dL (ref 2.2–3.9)
IgA: 31 mg/dL — ABNORMAL LOW (ref 64–422)
IgG (Immunoglobin G), Serum: 1456 mg/dL (ref 586–1602)
IgM (Immunoglobulin M), Srm: 31 mg/dL (ref 26–217)
M Protein SerPl Elph-Mcnc: 0.9 g/dL — ABNORMAL HIGH
Total Protein ELP: 6.9 g/dL (ref 6.0–8.5)

## 2021-01-07 ENCOUNTER — Telehealth: Payer: Self-pay | Admitting: Adult Health

## 2021-01-07 ENCOUNTER — Telehealth: Payer: Self-pay | Admitting: Hematology

## 2021-01-07 ENCOUNTER — Other Ambulatory Visit: Payer: Self-pay | Admitting: Adult Health

## 2021-01-07 DIAGNOSIS — C9000 Multiple myeloma not having achieved remission: Secondary | ICD-10-CM

## 2021-01-07 NOTE — Progress Notes (Signed)
I connected by phone with Lubertha Basque on 01/07/2021, 12:32 PM to discuss the potential use of a new treatment, tixagevimab/cilgavimab, for pre-exposure prophylaxis for prevention of coronavirus disease 2019 (COVID-19) caused by the SARS-CoV-2 virus.  This patient is a 75 y.o. female that meets the FDA criteria for Emergency Use Authorization of tixagevimab/cilgavimab for pre-exposure prophylaxis of COVID-19 disease. Pt meets following criteria: Age >12 yr and weight > 40kg Not currently infected with SARS-CoV-2 and has no known recent exposure to an individual infected with SARS-CoV-2 AND Who has moderate to severe immune compromise due to a medical condition or receipt of immunosuppressive medications or treatments and may not mount an adequate immune response to COVID-19 vaccination or  Vaccination with any available COVID-19 vaccine, according to the approved or authorized schedule, is not recommended due to a history of severe adverse reaction (e.g., severe allergic reaction) to a COVID-19 vaccine(s) and/or COVID-19 vaccine component(s).  Patient meets the following definition of mod-severe immune compromised status: 4. Lung transplants, other solid organ transplant recipients on continual belatacept therapy, or multiple myeloma (actively receiving treatment)  I have spoken and communicated the following to the patient or parent/caregiver regarding COVID monoclonal antibody treatment:  FDA has authorized the emergency use of tixagevimab/cilgavimab for the pre-exposure prophylaxis of COVID-19 in patients with moderate-severe immunocompromised status, who meet above EUA criteria.  The significant known and potential risks and benefits of COVID monoclonal antibody, and the extent to which such potential risks and benefits are unknown.  Information on available alternative treatments and the risks and benefits of those alternatives, including clinical trials.  The patient or parent/caregiver  has the option to accept or refuse COVID monoclonal antibody treatment.  After reviewing this information with the patient, agree to receive tixagevimab/cilgavimab. High priority schedule message sent.   Scot Dock, NP, 01/07/2021, 12:32 PM

## 2021-01-07 NOTE — Telephone Encounter (Signed)
Scheduled appointment per 07/12 sch msg. Patient will receive updated calender.

## 2021-01-07 NOTE — Telephone Encounter (Signed)
Left message with follow-up appointments per 7/7 los. 

## 2021-01-08 ENCOUNTER — Encounter: Payer: Self-pay | Admitting: Hematology

## 2021-01-08 ENCOUNTER — Other Ambulatory Visit: Payer: Self-pay

## 2021-01-08 DIAGNOSIS — C9000 Multiple myeloma not having achieved remission: Secondary | ICD-10-CM

## 2021-01-08 DIAGNOSIS — Z7189 Other specified counseling: Secondary | ICD-10-CM

## 2021-01-08 MED ORDER — LENALIDOMIDE 25 MG PO CAPS: 25.0000 mg | ORAL_CAPSULE | Freq: Every day | ORAL | 3 refills | Status: DC

## 2021-01-08 MED ORDER — ONDANSETRON HCL 8 MG PO TABS: 8.0000 mg | ORAL_TABLET | Freq: Two times a day (BID) | ORAL | 1 refills | Status: DC | PRN

## 2021-01-08 MED ORDER — DEXAMETHASONE 4 MG PO TABS: ORAL_TABLET | ORAL | 5 refills | Status: DC

## 2021-01-08 MED ORDER — ACYCLOVIR 400 MG PO TABS: 400.0000 mg | ORAL_TABLET | Freq: Two times a day (BID) | ORAL | 3 refills | Status: DC

## 2021-01-08 NOTE — Progress Notes (Signed)
DISCONTINUE ON PATHWAY REGIMEN - Multiple Myeloma and Other Plasma Cell Dyscrasias     A cycle is every 21 days:     Bortezomib      Lenalidomide      Dexamethasone   **Always confirm dose/schedule in your pharmacy ordering system**  REASON: Toxicities / Adverse Event PRIOR TREATMENT: [id number]: VRd (Bortezomib 1.3 mg/m2 SUBQ D1, 4, 8, 11 + Lenalidomide 25 mg + Dexamethasone 20 mg) q21 Days x 4-6 Cycles Maximum Prior to [employer] TREATMENT RESPONSE: Partial Response (PR)  START ON PATHWAY REGIMEN - Multiple Myeloma and Other Plasma Cell Dyscrasias   DaraVRd (Daratumumab IV + Bortezomib SUBQ + Lenalidomide PO + Dexamethasone IV/PO) q21 Days (Induction Schema):   A cycle is every 21 days:     Lenalidomide      Dexamethasone      Bortezomib      Daratumumab   **Always confirm dose/schedule in your pharmacy ordering system**  DaraVRd (Daratumumab IV + Bortezomib SUBQ + Lenalidomide PO + Dexamethasone IV/PO) q21 Days (Consolidation Schema):   A cycle is every 21 days:     Lenalidomide      Dexamethasone      Bortezomib      Daratumumab   **Always confirm dose/schedule in your pharmacy ordering system**  Patient Characteristics: Multiple Myeloma, Newly Diagnosed, Transplant Eligible, Standard Risk Disease Classification: Multiple Myeloma R-ISS Staging: Unknown Therapeutic Status: Newly Diagnosed Is Patient Eligible for Transplant<= Transplant Eligible Risk Status: Standard Risk Intent of Therapy: Non-Curative / Palliative Intent, Discussed with Patient

## 2021-01-13 NOTE — Progress Notes (Signed)
Pharmacist Chemotherapy Monitoring - Initial Assessment    Anticipated start date: 01/20/21   The following has been reviewed per standard work regarding the patient's treatment regimen: The patient's diagnosis, treatment plan and drug doses, and organ/hematologic function Lab orders and baseline tests specific to treatment regimen  The treatment plan start date, drug sequencing, and pre-medications Prior authorization status  Patient's documented medication list, including drug-drug interaction screen and prescriptions for anti-emetics and supportive care specific to the treatment regimen The drug concentrations, fluid compatibility, administration routes, and timing of the medications to be used The patient's access for treatment and lifetime cumulative dose history, if applicable  The patient's medication allergies and previous infusion related reactions, if applicable   Changes made to treatment plan:  N/A  Follow up needed:  N/A   Larene Beach, RPH, 01/13/2021  4:00 PM

## 2021-01-15 ENCOUNTER — Other Ambulatory Visit: Payer: Self-pay

## 2021-01-15 ENCOUNTER — Ambulatory Visit
Admission: RE | Admit: 2021-01-15 | Discharge: 2021-01-15 | Disposition: A | Discharge status: Home / Self Care | Payer: Medicare HMO | Source: Ambulatory Visit | Attending: Family Medicine | Admitting: Family Medicine

## 2021-01-15 DIAGNOSIS — Z1231 Encounter for screening mammogram for malignant neoplasm of breast: Secondary | ICD-10-CM | POA: Diagnosis not present

## 2021-01-16 ENCOUNTER — Other Ambulatory Visit (HOSPITAL_COMMUNITY): Payer: Self-pay

## 2021-01-16 ENCOUNTER — Other Ambulatory Visit: Payer: Self-pay | Admitting: Hematology

## 2021-01-16 MED ORDER — DULOXETINE HCL 60 MG PO CPEP
60.0000 mg | ORAL_CAPSULE | Freq: Every day | ORAL | 2 refills | Status: DC
  Filled 2021-01-16: qty 30, 30d supply, fill #0
  Filled 2021-02-13: qty 30, 30d supply, fill #1
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
## 2021-01-17 ENCOUNTER — Inpatient Hospital Stay (HOSPITAL_BASED_OUTPATIENT_CLINIC_OR_DEPARTMENT_OTHER): Payer: Medicare HMO | Admitting: Hematology

## 2021-01-17 ENCOUNTER — Other Ambulatory Visit: Payer: Self-pay

## 2021-01-17 ENCOUNTER — Encounter: Payer: Self-pay | Admitting: Hematology

## 2021-01-17 ENCOUNTER — Inpatient Hospital Stay: Payer: Medicare HMO

## 2021-01-17 ENCOUNTER — Other Ambulatory Visit (HOSPITAL_COMMUNITY): Payer: Self-pay

## 2021-01-17 VITALS — BP 130/75 | HR 69 | Temp 98.3°F | Resp 18 | Wt 151.5 lb

## 2021-01-17 DIAGNOSIS — Z79899 Other long term (current) drug therapy: Secondary | ICD-10-CM | POA: Diagnosis not present

## 2021-01-17 DIAGNOSIS — C9000 Multiple myeloma not having achieved remission: Secondary | ICD-10-CM

## 2021-01-17 DIAGNOSIS — D709 Neutropenia, unspecified: Secondary | ICD-10-CM | POA: Diagnosis not present

## 2021-01-17 DIAGNOSIS — Z7189 Other specified counseling: Secondary | ICD-10-CM

## 2021-01-17 DIAGNOSIS — Z7982 Long term (current) use of aspirin: Secondary | ICD-10-CM | POA: Diagnosis not present

## 2021-01-17 DIAGNOSIS — Z298 Encounter for other specified prophylactic measures: Secondary | ICD-10-CM | POA: Diagnosis not present

## 2021-01-17 LAB — CBC WITH DIFFERENTIAL (CANCER CENTER ONLY)
Abs Immature Granulocytes: 0 10*3/uL (ref 0.00–0.07)
Basophils Absolute: 0.1 10*3/uL (ref 0.0–0.1)
Basophils Relative: 2 %
Eosinophils Absolute: 0.1 10*3/uL (ref 0.0–0.5)
Eosinophils Relative: 2 %
HCT: 37.3 % (ref 36.0–46.0)
Hemoglobin: 12.4 g/dL (ref 12.0–15.0)
Immature Granulocytes: 0 %
Lymphocytes Relative: 46 %
Lymphs Abs: 1.3 10*3/uL (ref 0.7–4.0)
MCH: 32 pg (ref 26.0–34.0)
MCHC: 33.2 g/dL (ref 30.0–36.0)
MCV: 96.4 fL (ref 80.0–100.0)
Monocytes Absolute: 0.5 10*3/uL (ref 0.1–1.0)
Monocytes Relative: 15 %
Neutro Abs: 1 10*3/uL — ABNORMAL LOW (ref 1.7–7.7)
Neutrophils Relative %: 35 %
Platelet Count: 217 10*3/uL (ref 150–400)
RBC: 3.87 MIL/uL (ref 3.87–5.11)
RDW: 13.8 % (ref 11.5–15.5)
WBC Count: 2.9 10*3/uL — ABNORMAL LOW (ref 4.0–10.5)
nRBC: 0 % (ref 0.0–0.2)

## 2021-01-17 LAB — CMP (CANCER CENTER ONLY)
ALT: 27 U/L (ref 0–44)
AST: 26 U/L (ref 15–41)
Albumin: 3.8 g/dL (ref 3.5–5.0)
Alkaline Phosphatase: 76 U/L (ref 38–126)
Anion gap: 8 (ref 5–15)
BUN: 10 mg/dL (ref 8–23)
CO2: 26 mmol/L (ref 22–32)
Calcium: 9.5 mg/dL (ref 8.9–10.3)
Chloride: 108 mmol/L (ref 98–111)
Creatinine: 0.8 mg/dL (ref 0.44–1.00)
GFR, Estimated: >60 mL/min (ref >60)
Glucose, Bld: 93 mg/dL (ref 70–99)
Potassium: 4.4 mmol/L (ref 3.5–5.1)
Sodium: 142 mmol/L (ref 135–145)
Total Bilirubin: 0.3 mg/dL (ref 0.3–1.2)
Total Protein: 7.4 g/dL (ref 6.5–8.1)

## 2021-01-17 LAB — TYPE AND SCREEN
ABO/RH(D): A POS
Antibody Screen: NEGATIVE

## 2021-01-17 LAB — SAMPLE TO BLOOD BANK

## 2021-01-17 LAB — PRETREATMENT RBC PHENOTYPE

## 2021-01-20 ENCOUNTER — Encounter: Payer: Self-pay | Admitting: Hematology

## 2021-01-20 ENCOUNTER — Inpatient Hospital Stay: Payer: Medicare HMO

## 2021-01-20 ENCOUNTER — Other Ambulatory Visit: Payer: Self-pay

## 2021-01-20 VITALS — BP 133/64 | HR 79 | Temp 98.2°F | Resp 16

## 2021-01-20 DIAGNOSIS — Z7982 Long term (current) use of aspirin: Secondary | ICD-10-CM | POA: Diagnosis not present

## 2021-01-20 DIAGNOSIS — Z79899 Other long term (current) drug therapy: Secondary | ICD-10-CM | POA: Diagnosis not present

## 2021-01-20 DIAGNOSIS — C9000 Multiple myeloma not having achieved remission: Secondary | ICD-10-CM

## 2021-01-20 DIAGNOSIS — D709 Neutropenia, unspecified: Secondary | ICD-10-CM | POA: Diagnosis not present

## 2021-01-20 DIAGNOSIS — Z7189 Other specified counseling: Secondary | ICD-10-CM

## 2021-01-20 DIAGNOSIS — Z298 Encounter for other specified prophylactic measures: Secondary | ICD-10-CM | POA: Diagnosis not present

## 2021-01-20 MED ORDER — DIPHENHYDRAMINE HCL 25 MG PO CAPS
ORAL_CAPSULE | ORAL | Status: AC
  Filled 2021-01-20: qty 2

## 2021-01-20 MED ORDER — ACETAMINOPHEN 325 MG PO TABS
ORAL_TABLET | ORAL | Status: AC
  Filled 2021-01-20: qty 2

## 2021-01-20 MED ORDER — FAMOTIDINE 20 MG IN NS 100 ML IVPB
20.0000 mg | Freq: Once | INTRAVENOUS | Status: AC
  Administered 2021-01-20: 20 mg via INTRAVENOUS

## 2021-01-20 MED ORDER — FAMOTIDINE 20 MG IN NS 100 ML IVPB
INTRAVENOUS | Status: AC
  Filled 2021-01-20: qty 100

## 2021-01-20 MED ORDER — MONTELUKAST SODIUM 10 MG PO TABS
ORAL_TABLET | ORAL | Status: AC
  Filled 2021-01-20: qty 1

## 2021-01-20 MED ORDER — DIPHENHYDRAMINE HCL 25 MG PO CAPS
50.0000 mg | ORAL_CAPSULE | Freq: Once | ORAL | Status: AC
  Administered 2021-01-20: 50 mg via ORAL

## 2021-01-20 MED ORDER — ACETAMINOPHEN 325 MG PO TABS
ORAL_TABLET | ORAL | Status: AC
  Filled 2021-01-20: qty 1

## 2021-01-20 MED ORDER — SODIUM CHLORIDE 0.9 % IV SOLN
16.0000 mg/kg | Freq: Once | INTRAVENOUS | Status: AC
  Administered 2021-01-20: 1100 mg via INTRAVENOUS
  Filled 2021-01-20: qty 40

## 2021-01-20 MED ORDER — ACETAMINOPHEN 325 MG PO TABS
650.0000 mg | ORAL_TABLET | Freq: Once | ORAL | Status: AC
  Administered 2021-01-20: 650 mg via ORAL

## 2021-01-20 MED ORDER — SODIUM CHLORIDE 0.9 % IV SOLN
Freq: Once | INTRAVENOUS | Status: AC
  Filled 2021-01-20: qty 250

## 2021-01-20 MED ORDER — MONTELUKAST SODIUM 10 MG PO TABS
10.0000 mg | ORAL_TABLET | Freq: Once | ORAL | Status: AC
  Administered 2021-01-20: 10 mg via ORAL

## 2021-01-20 MED ORDER — SODIUM CHLORIDE 0.9 % IV SOLN
20.0000 mg | Freq: Once | INTRAVENOUS | Status: AC
  Administered 2021-01-20: 20 mg via INTRAVENOUS
  Filled 2021-01-20: qty 20

## 2021-01-20 NOTE — Progress Notes (Signed)
Per Dr. Irene Limbo, ok to treat with ANC 1.0.

## 2021-01-20 NOTE — Patient Instructions (Signed)
[facility] ONCOLOGY   Discharge Instructions: Thank you for choosing [facility] to provide your oncology and hematology care.   If you have a lab appointment with the [facility], please go directly to the [facility] and check in at the registration area.   Wear comfortable clothing and clothing appropriate for easy access to any Portacath or PICC line.   We strive to give you quality time with your provider. You may need to reschedule your appointment if you arrive late (15 or more minutes).  Arriving late affects you and other patients whose appointments are after yours.  Also, if you miss three or more appointments without notifying the office, you may be dismissed from the clinic at the provider's discretion.      For prescription refill requests, have your pharmacy contact our office and allow 72 hours for refills to be completed.    Today you received the following chemotherapy and/or immunotherapy agents: daratumumab.      To help prevent nausea and vomiting after your treatment, we encourage you to take your nausea medication as directed.  BELOW ARE SYMPTOMS THAT SHOULD BE REPORTED IMMEDIATELY: *FEVER GREATER THAN 100.4 F (38 C) OR HIGHER *CHILLS OR SWEATING *NAUSEA AND VOMITING THAT IS NOT CONTROLLED WITH YOUR NAUSEA MEDICATION *UNUSUAL SHORTNESS OF BREATH *UNUSUAL BRUISING OR BLEEDING *URINARY PROBLEMS (pain or burning when urinating, or frequent urination) *BOWEL PROBLEMS (unusual diarrhea, constipation, pain near the anus) TENDERNESS IN MOUTH AND THROAT WITH OR WITHOUT PRESENCE OF ULCERS (sore throat, sores in mouth, or a toothache) UNUSUAL RASH, SWELLING OR PAIN  UNUSUAL VAGINAL DISCHARGE OR ITCHING   Items with * indicate a potential emergency and should be followed up as soon as possible or go to the Emergency Department if any problems should occur.  Please show the CHEMOTHERAPY ALERT CARD or IMMUNOTHERAPY ALERT CARD at  check-in to the Emergency Department and triage nurse.  Should you have questions after your visit or need to cancel or reschedule your appointment, please contact [facility]  Dept: [phone]  and follow the prompts.  Office hours are 8:00 a.m. to 4:30 p.m. Monday - Friday. Please note that voicemails left after 4:00 p.m. may not be returned until the following business day.  We are closed weekends and major holidays. You have access to a nurse at all times for urgent questions. Please call the main number to the clinic Dept: [phone] and follow the prompts.   For any non-urgent questions, you may also contact your provider using MyChart. We now offer e-Visits for anyone 67 and older to request care online for non-urgent symptoms. For details visit [email or web address].   Also download the MyChart app! Go to the app store, search "MyChart", open the app, select [facility], and log in with your MyChart username and password.  Due to Covid, a mask is required upon entering the hospital/clinic. If you do not have a mask, one will be given to you upon arrival. For doctor visits, patients may have 1 support person aged 59 or older with them. For treatment visits, patients cannot have anyone with them due to current Covid guidelines and our immunocompromised population.   Daratumumab injection What is this medication? DARATUMUMAB (dar a toom ue mab) is a monoclonal antibody. It is used to treatmultiple myeloma. This medicine may be used for other purposes; ask your health care provider orpharmacist if you have questions. COMMON BRAND NAME(S): DARZALEX What should I tell  my care team before I take this medication? They need to know if you have any of these conditions: hereditary fructose intolerance infection (especially a virus infection such as chickenpox, herpes, or hepatitis B virus) lung or breathing disease (asthma, COPD) an unusual or allergic reaction  to daratumumab, sorbitol, other medicines, foods, dyes, or preservatives pregnant or trying to get pregnant breast-feeding How should I use this medication? This medicine is for infusion into a vein. It is given by a health careprofessional in a hospital or clinic setting. Talk to your pediatrician regarding the use of this medicine in children.Special care may be needed. Overdosage: If you think you have taken too much of this medicine contact apoison control center or emergency room at once. NOTE: This medicine is only for you. Do not share this medicine with others. What if I miss a dose? Keep appointments for follow-up doses as directed. It is important not to miss your dose. Call your doctor or health care professional if you are unable tokeep an appointment. What may interact with this medication? Interactions have not been studied. This list may not describe all possible interactions. Give your health care provider a list of all the medicines, herbs, non-prescription drugs, or dietary supplements you use. Also tell them if you smoke, drink alcohol, or use illegaldrugs. Some items may interact with your medicine. What should I watch for while using this medication? Your condition will be monitored carefully while you are receiving thismedicine. This medicine can cause serious allergic reactions. To reduce your risk, your health care provider may give you other medicine to take before receiving thisone. Be sure to follow the directions from your health care provider. This medicine can affect the results of blood tests to match your blood type. These changes can last for up to 6 months after the final dose. Your healthcare provider will do blood tests to match your blood type before you start treatment. Tell all of your healthcare providers that you are being treatedwith this medicine before receiving a blood transfusion. This medicine can affect the results of some tests used to determine  treatmentresponse; extra tests may be needed to evaluate response. Do not become pregnant while taking this medicine or for 3 months after stopping it. Women should inform their health care provider if they wish to become pregnant or think they might be pregnant. There is a potential for serious side effects to an unborn child. Talk to your health care provider formore information. Do not breast-feed an infant while taking this medicine. What side effects may I notice from receiving this medication? Side effects that you should report to your doctor or health care professionalas soon as possible: allergic reactions (skin rash, itching, hives, swelling of the face, lips, or tongue) blurred vision infection (fever, chills, cough, sore throat, pain or difficulty passing urine) infusion reaction (dizziness, fast heartbeat, feeling faint or lightheaded, falls, headache, increase in blood pressure, nausea, vomiting, or wheezing or trouble breathing with loud or whistling sounds) unusual bleeding or bruising Side effects that usually do not require medical attention (report to yourdoctor or health care professional if they continue or are bothersome): constipation diarrhea pain, tingling, numbness in the hands or feet swelling of the ankles, feet, hands tiredness This list may not describe all possible side effects. Call your doctor for medical advice about side effects. You may report side effects to FDA at1-800-FDA-1088. Where should I keep my medication? This drug is given in a hospital or clinic and will not  be stored at home. NOTE: This sheet is a summary. It may not cover all possible information. If you have questions about this medicine, talk to your doctor, pharmacist, orhealth care provider.  2022 Elsevier/Gold Standard (2020-07-25 12:50:38)

## 2021-01-21 ENCOUNTER — Inpatient Hospital Stay: Payer: Medicare HMO

## 2021-01-21 DIAGNOSIS — C9000 Multiple myeloma not having achieved remission: Secondary | ICD-10-CM | POA: Diagnosis not present

## 2021-01-21 DIAGNOSIS — Z79899 Other long term (current) drug therapy: Secondary | ICD-10-CM | POA: Diagnosis not present

## 2021-01-21 DIAGNOSIS — Z7982 Long term (current) use of aspirin: Secondary | ICD-10-CM | POA: Diagnosis not present

## 2021-01-21 DIAGNOSIS — Z298 Encounter for other specified prophylactic measures: Secondary | ICD-10-CM | POA: Diagnosis not present

## 2021-01-21 DIAGNOSIS — D709 Neutropenia, unspecified: Secondary | ICD-10-CM | POA: Diagnosis not present

## 2021-01-21 MED ORDER — TIXAGEVIMAB (PART OF EVUSHELD) INJECTION
300.0000 mg | Freq: Once | INTRAMUSCULAR | Status: AC
  Administered 2021-01-21: 300 mg via INTRAMUSCULAR
  Filled 2021-01-21: qty 3

## 2021-01-21 MED ORDER — CILGAVIMAB (PART OF EVUSHELD) INJECTION
300.0000 mg | Freq: Once | INTRAMUSCULAR | Status: AC
  Administered 2021-01-21: 300 mg via INTRAMUSCULAR
  Filled 2021-01-21: qty 3

## 2021-01-21 NOTE — Progress Notes (Signed)
01/21/2021 '@0830'$  - Evusheld given in bilateral gluteal (Cilgavimab '300mg'$  IM Injection given in the Left gluteal and Tixagevimab '300mg'$  IM Injection given in the right gluteal).  Pt tolerated the procedure well.  30 min post observation performed and pt remained WNL.  Pt discharged home stable.

## 2021-01-21 NOTE — Patient Instructions (Signed)
[facility] ONCOLOGY  Discharge Instructions: Thank you for choosing [facility] to provide your oncology and hematology care.   If you have a lab appointment with the [facility], please go directly to the [facility] and check in at the registration area.   Wear comfortable clothing and clothing appropriate for easy access to any Portacath or PICC line.   We strive to give you quality time with your provider. You may need to reschedule your appointment if you arrive late (15 or more minutes).  Arriving late affects you and other patients whose appointments are after yours.  Also, if you miss three or more appointments without notifying the office, you may be dismissed from the clinic at the provider's discretion.      For prescription refill requests, have your pharmacy contact our office and allow 72 hours for refills to be completed.    Today you received the following chemotherapy and/or immunotherapy agents Evusheld IM Injection      To help prevent nausea and vomiting after your treatment, we encourage you to take your nausea medication as directed.  BELOW ARE SYMPTOMS THAT SHOULD BE REPORTED IMMEDIATELY: *FEVER GREATER THAN 100.4 F (38 C) OR HIGHER *CHILLS OR SWEATING *NAUSEA AND VOMITING THAT IS NOT CONTROLLED WITH YOUR NAUSEA MEDICATION *UNUSUAL SHORTNESS OF BREATH *UNUSUAL BRUISING OR BLEEDING *URINARY PROBLEMS (pain or burning when urinating, or frequent urination) *BOWEL PROBLEMS (unusual diarrhea, constipation, pain near the anus) TENDERNESS IN MOUTH AND THROAT WITH OR WITHOUT PRESENCE OF ULCERS (sore throat, sores in mouth, or a toothache) UNUSUAL RASH, SWELLING OR PAIN  UNUSUAL VAGINAL DISCHARGE OR ITCHING   Items with * indicate a potential emergency and should be followed up as soon as possible or go to the Emergency Department if any problems should occur.  Please show the CHEMOTHERAPY ALERT CARD or IMMUNOTHERAPY ALERT CARD at  check-in to the Emergency Department and triage nurse.  Should you have questions after your visit or need to cancel or reschedule your appointment, please contact [facility]  Dept: [phone]  and follow the prompts.  Office hours are 8:00 a.m. to 4:30 p.m. Monday - Friday. Please note that voicemails left after 4:00 p.m. may not be returned until the following business day.  We are closed weekends and major holidays. You have access to a nurse at all times for urgent questions. Please call the main number to the clinic Dept: [phone] and follow the prompts.   For any non-urgent questions, you may also contact your provider using MyChart. We now offer e-Visits for anyone 40 and older to request care online for non-urgent symptoms. For details visit [email or web address].   Also download the MyChart app! Go to the app store, search "MyChart", open the app, select [facility], and log in with your MyChart username and password.  Due to Covid, a mask is required upon entering the hospital/clinic. If you do not have a mask, one will be given to you upon arrival. For doctor visits, patients may have 1 support person aged 68 or older with them. For treatment visits, patients cannot have anyone with them due to current Covid guidelines and our immunocompromised population.

## 2021-01-24 ENCOUNTER — Encounter: Payer: Self-pay | Admitting: Hematology

## 2021-01-24 ENCOUNTER — Other Ambulatory Visit: Payer: Self-pay

## 2021-01-24 DIAGNOSIS — C9000 Multiple myeloma not having achieved remission: Secondary | ICD-10-CM
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
## 2021-01-27 ENCOUNTER — Inpatient Hospital Stay (HOSPITAL_BASED_OUTPATIENT_CLINIC_OR_DEPARTMENT_OTHER): Payer: Medicare HMO | Admitting: Hematology

## 2021-01-27 ENCOUNTER — Inpatient Hospital Stay: Payer: Medicare HMO

## 2021-01-27 ENCOUNTER — Other Ambulatory Visit: Payer: Self-pay

## 2021-01-27 ENCOUNTER — Inpatient Hospital Stay: Payer: Medicare HMO | Attending: Hematology

## 2021-01-27 VITALS — BP 134/66 | HR 77 | Temp 97.8°F | Resp 17 | Wt 152.1 lb

## 2021-01-27 DIAGNOSIS — Z5112 Encounter for antineoplastic immunotherapy: Secondary | ICD-10-CM | POA: Insufficient documentation

## 2021-01-27 DIAGNOSIS — Z7982 Long term (current) use of aspirin: Secondary | ICD-10-CM | POA: Insufficient documentation

## 2021-01-27 DIAGNOSIS — D696 Thrombocytopenia, unspecified: Secondary | ICD-10-CM | POA: Diagnosis not present

## 2021-01-27 DIAGNOSIS — C9000 Multiple myeloma not having achieved remission: Secondary | ICD-10-CM

## 2021-01-27 DIAGNOSIS — D709 Neutropenia, unspecified: Secondary | ICD-10-CM | POA: Diagnosis not present

## 2021-01-27 DIAGNOSIS — Z5111 Encounter for antineoplastic chemotherapy: Secondary | ICD-10-CM

## 2021-01-27 DIAGNOSIS — Z79899 Other long term (current) drug therapy: Secondary | ICD-10-CM | POA: Diagnosis not present

## 2021-01-27 DIAGNOSIS — Z7189 Other specified counseling: Secondary | ICD-10-CM

## 2021-01-27 LAB — CBC WITH DIFFERENTIAL (CANCER CENTER ONLY)
Abs Immature Granulocytes: 0 10*3/uL (ref 0.00–0.07)
Basophils Absolute: 0.1 10*3/uL (ref 0.0–0.1)
Basophils Relative: 2 %
Eosinophils Absolute: 0.2 10*3/uL (ref 0.0–0.5)
Eosinophils Relative: 7 %
HCT: 35.5 % — ABNORMAL LOW (ref 36.0–46.0)
Hemoglobin: 11.9 g/dL — ABNORMAL LOW (ref 12.0–15.0)
Immature Granulocytes: 0 %
Lymphocytes Relative: 29 %
Lymphs Abs: 0.9 10*3/uL (ref 0.7–4.0)
MCH: 32 pg (ref 26.0–34.0)
MCHC: 33.5 g/dL (ref 30.0–36.0)
MCV: 95.4 fL (ref 80.0–100.0)
Monocytes Absolute: 0.4 10*3/uL (ref 0.1–1.0)
Monocytes Relative: 14 %
Neutro Abs: 1.4 10*3/uL — ABNORMAL LOW (ref 1.7–7.7)
Neutrophils Relative %: 48 %
Platelet Count: 140 10*3/uL — ABNORMAL LOW (ref 150–400)
RBC: 3.72 MIL/uL — ABNORMAL LOW (ref 3.87–5.11)
RDW: 14.5 % (ref 11.5–15.5)
WBC Count: 2.9 10*3/uL — ABNORMAL LOW (ref 4.0–10.5)
nRBC: 0 % (ref 0.0–0.2)

## 2021-01-27 LAB — CMP (CANCER CENTER ONLY)
ALT: 24 U/L (ref 0–44)
AST: 16 U/L (ref 15–41)
Albumin: 3.5 g/dL (ref 3.5–5.0)
Alkaline Phosphatase: 69 U/L (ref 38–126)
Anion gap: 6 (ref 5–15)
BUN: 14 mg/dL (ref 8–23)
CO2: 26 mmol/L (ref 22–32)
Calcium: 9.5 mg/dL (ref 8.9–10.3)
Chloride: 108 mmol/L (ref 98–111)
Creatinine: 0.86 mg/dL (ref 0.44–1.00)
GFR, Estimated: >60 mL/min (ref >60)
Glucose, Bld: 95 mg/dL (ref 70–99)
Potassium: 4.2 mmol/L (ref 3.5–5.1)
Sodium: 140 mmol/L (ref 135–145)
Total Bilirubin: 0.3 mg/dL (ref 0.3–1.2)
Total Protein: 6.6 g/dL (ref 6.5–8.1)

## 2021-01-27 MED ORDER — FAMOTIDINE 20 MG IN NS 100 ML IVPB
20.0000 mg | Freq: Once | INTRAVENOUS | Status: AC
  Administered 2021-01-27: 20 mg via INTRAVENOUS

## 2021-01-27 MED ORDER — MONTELUKAST SODIUM 10 MG PO TABS
10.0000 mg | ORAL_TABLET | Freq: Once | ORAL | Status: AC
  Administered 2021-01-27: 10 mg via ORAL

## 2021-01-27 MED ORDER — SODIUM CHLORIDE 0.9 % IV SOLN
16.0000 mg/kg | Freq: Once | INTRAVENOUS | Status: AC
  Administered 2021-01-27: 1100 mg via INTRAVENOUS
  Filled 2021-01-27: qty 40

## 2021-01-27 MED ORDER — FAMOTIDINE 20 MG IN NS 100 ML IVPB
INTRAVENOUS | Status: AC
  Filled 2021-01-27: qty 100

## 2021-01-27 MED ORDER — DIPHENHYDRAMINE HCL 25 MG PO CAPS
50.0000 mg | ORAL_CAPSULE | Freq: Once | ORAL | Status: AC
  Administered 2021-01-27: 50 mg via ORAL

## 2021-01-27 MED ORDER — ACETAMINOPHEN 325 MG PO TABS
650.0000 mg | ORAL_TABLET | Freq: Once | ORAL | Status: AC
  Administered 2021-01-27: 650 mg via ORAL

## 2021-01-27 MED ORDER — MONTELUKAST SODIUM 10 MG PO TABS
ORAL_TABLET | ORAL | Status: AC
  Filled 2021-01-27: qty 1

## 2021-01-27 MED ORDER — SODIUM CHLORIDE 0.9 % IV SOLN
Freq: Once | INTRAVENOUS | Status: AC
  Filled 2021-01-27: qty 250

## 2021-01-27 MED ORDER — DIPHENHYDRAMINE HCL 25 MG PO CAPS
ORAL_CAPSULE | ORAL | Status: AC
  Filled 2021-01-27: qty 2

## 2021-01-27 MED ORDER — SODIUM CHLORIDE 0.9 % IV SOLN
20.0000 mg | Freq: Once | INTRAVENOUS | Status: AC
  Administered 2021-01-27: 20 mg via INTRAVENOUS
  Filled 2021-01-27: qty 20

## 2021-01-27 MED ORDER — ACETAMINOPHEN 325 MG PO TABS
ORAL_TABLET | ORAL | Status: AC
  Filled 2021-01-27: qty 2

## 2021-01-27 NOTE — Patient Instructions (Signed)
[facility] MEDICAL ONCOLOGY  Discharge Instructions: °Thank you for choosing [facility] to provide your oncology and hematology care.  ° °If you have a lab appointment with the Cancer Center, please go directly to the Cancer Center and check in at the registration area. °  °Wear comfortable clothing and clothing appropriate for easy access to any Portacath or PICC line.  ° °We strive to give you quality time with your provider. You may need to reschedule your appointment if you arrive late (15 or more minutes).  Arriving late affects you and other patients whose appointments are after yours.  Also, if you miss three or more appointments without notifying the office, you may be dismissed from the clinic at the provider’s discretion.    °  °For prescription refill requests, have your pharmacy contact our office and allow 72 hours for refills to be completed.   ° °Today you received the following chemotherapy and/or immunotherapy agents: Daratumumab.     °  °To help prevent nausea and vomiting after your treatment, we encourage you to take your nausea medication as directed. ° °BELOW ARE SYMPTOMS THAT SHOULD BE REPORTED IMMEDIATELY: °*FEVER GREATER THAN 100.4 F (38 °C) OR HIGHER °*CHILLS OR SWEATING °*NAUSEA AND VOMITING THAT IS NOT CONTROLLED WITH YOUR NAUSEA MEDICATION °*UNUSUAL SHORTNESS OF BREATH °*UNUSUAL BRUISING OR BLEEDING °*URINARY PROBLEMS (pain or burning when urinating, or frequent urination) °*BOWEL PROBLEMS (unusual diarrhea, constipation, pain near the anus) °TENDERNESS IN MOUTH AND THROAT WITH OR WITHOUT PRESENCE OF ULCERS (sore throat, sores in mouth, or a toothache) °UNUSUAL RASH, SWELLING OR PAIN  °UNUSUAL VAGINAL DISCHARGE OR ITCHING  ° °Items with * indicate a potential emergency and should be followed up as soon as possible or go to the Emergency Department if any problems should occur. ° °Please show the CHEMOTHERAPY ALERT CARD or IMMUNOTHERAPY ALERT CARD at check-in  to the Emergency Department and triage nurse. ° °Should you have questions after your visit or need to cancel or reschedule your appointment, please contact [facility] MEDICAL ONCOLOGY  Dept: [phone]  and follow the prompts.  Office hours are 8:00 a.m. to 4:30 p.m. Monday - Friday. Please note that voicemails left after 4:00 p.m. may not be returned until the following business day.  We are closed weekends and major holidays. You have access to a nurse at all times for urgent questions. Please call the main number to the clinic Dept: [phone] and follow the prompts. ° ° °For any non-urgent questions, you may also contact your provider using MyChart. We now offer e-Visits for anyone 18 and older to request care online for non-urgent symptoms. For details visit [email or web address]. °  °Also download the MyChart app! Go to the app store, search "MyChart", open the app, select [facility], and log in with your MyChart username and password. ° °Due to Covid, a mask is required upon entering the hospital/clinic. If you do not have a mask, one will be given to you upon arrival. For doctor visits, patients may have 1 support person aged 18 or older with them. For treatment visits, patients cannot have anyone with them due to current Covid guidelines and our immunocompromised population.  ° °

## 2021-01-28 ENCOUNTER — Telehealth: Payer: Self-pay | Admitting: Hematology

## 2021-01-28 ENCOUNTER — Other Ambulatory Visit: Payer: Self-pay | Admitting: Hematology

## 2021-01-28 NOTE — Telephone Encounter (Signed)
Left message with follow-up appointments per 8/1 los. 

## 2021-01-30 ENCOUNTER — Other Ambulatory Visit: Payer: Self-pay | Admitting: Hematology

## 2021-01-31 ENCOUNTER — Other Ambulatory Visit (HOSPITAL_COMMUNITY): Payer: Self-pay

## 2021-01-31 DIAGNOSIS — R69 Illness, unspecified: Secondary | ICD-10-CM | POA: Diagnosis not present

## 2021-01-31 DIAGNOSIS — K219 Gastro-esophageal reflux disease without esophagitis: Secondary | ICD-10-CM | POA: Diagnosis not present

## 2021-01-31 DIAGNOSIS — E1169 Type 2 diabetes mellitus with other specified complication: Secondary | ICD-10-CM | POA: Diagnosis not present

## 2021-01-31 DIAGNOSIS — E785 Hyperlipidemia, unspecified: Secondary | ICD-10-CM | POA: Diagnosis not present

## 2021-01-31 DIAGNOSIS — I10 Essential (primary) hypertension: Secondary | ICD-10-CM | POA: Diagnosis not present

## 2021-02-01 ENCOUNTER — Encounter: Payer: Self-pay | Admitting: Hematology

## 2021-02-01 ENCOUNTER — Other Ambulatory Visit (HOSPITAL_COMMUNITY): Payer: Self-pay

## 2021-02-02 ENCOUNTER — Encounter: Payer: Self-pay | Admitting: Hematology

## 2021-02-03 ENCOUNTER — Inpatient Hospital Stay: Payer: Medicare HMO

## 2021-02-03 ENCOUNTER — Ambulatory Visit: Payer: Medicare HMO | Admitting: Hematology

## 2021-02-03 ENCOUNTER — Other Ambulatory Visit: Payer: Self-pay

## 2021-02-03 VITALS — BP 136/71 | HR 72 | Temp 98.5°F | Resp 18

## 2021-02-03 DIAGNOSIS — D709 Neutropenia, unspecified: Secondary | ICD-10-CM | POA: Diagnosis not present

## 2021-02-03 DIAGNOSIS — Z7189 Other specified counseling: Secondary | ICD-10-CM

## 2021-02-03 DIAGNOSIS — Z5112 Encounter for antineoplastic immunotherapy: Secondary | ICD-10-CM | POA: Diagnosis not present

## 2021-02-03 DIAGNOSIS — Z7982 Long term (current) use of aspirin: Secondary | ICD-10-CM | POA: Diagnosis not present

## 2021-02-03 DIAGNOSIS — C9000 Multiple myeloma not having achieved remission: Secondary | ICD-10-CM | POA: Diagnosis not present

## 2021-02-03 DIAGNOSIS — Z79899 Other long term (current) drug therapy: Secondary | ICD-10-CM | POA: Diagnosis not present

## 2021-02-03 DIAGNOSIS — D696 Thrombocytopenia, unspecified: Secondary | ICD-10-CM | POA: Diagnosis not present

## 2021-02-03 LAB — CBC WITH DIFFERENTIAL (CANCER CENTER ONLY)
Abs Immature Granulocytes: 0.01 10*3/uL (ref 0.00–0.07)
Basophils Absolute: 0 10*3/uL (ref 0.0–0.1)
Basophils Relative: 1 %
Eosinophils Absolute: 0.1 10*3/uL (ref 0.0–0.5)
Eosinophils Relative: 3 %
HCT: 35.5 % — ABNORMAL LOW (ref 36.0–46.0)
Hemoglobin: 12 g/dL (ref 12.0–15.0)
Immature Granulocytes: 0 %
Lymphocytes Relative: 18 %
Lymphs Abs: 0.6 10*3/uL — ABNORMAL LOW (ref 0.7–4.0)
MCH: 32.3 pg (ref 26.0–34.0)
MCHC: 33.8 g/dL (ref 30.0–36.0)
MCV: 95.4 fL (ref 80.0–100.0)
Monocytes Absolute: 0.6 10*3/uL (ref 0.1–1.0)
Monocytes Relative: 17 %
Neutro Abs: 2.1 10*3/uL (ref 1.7–7.7)
Neutrophils Relative %: 61 %
Platelet Count: 119 10*3/uL — ABNORMAL LOW (ref 150–400)
RBC: 3.72 MIL/uL — ABNORMAL LOW (ref 3.87–5.11)
RDW: 14.6 % (ref 11.5–15.5)
WBC Count: 3.4 10*3/uL — ABNORMAL LOW (ref 4.0–10.5)
nRBC: 0 % (ref 0.0–0.2)

## 2021-02-03 LAB — CMP (CANCER CENTER ONLY)
ALT: 22 U/L (ref 0–44)
AST: 14 U/L — ABNORMAL LOW (ref 15–41)
Albumin: 3.4 g/dL — ABNORMAL LOW (ref 3.5–5.0)
Alkaline Phosphatase: 70 U/L (ref 38–126)
Anion gap: 7 (ref 5–15)
BUN: 13 mg/dL (ref 8–23)
CO2: 24 mmol/L (ref 22–32)
Calcium: 9.4 mg/dL (ref 8.9–10.3)
Chloride: 109 mmol/L (ref 98–111)
Creatinine: 0.78 mg/dL (ref 0.44–1.00)
GFR, Estimated: >60 mL/min (ref >60)
Glucose, Bld: 100 mg/dL — ABNORMAL HIGH (ref 70–99)
Potassium: 4.2 mmol/L (ref 3.5–5.1)
Sodium: 140 mmol/L (ref 135–145)
Total Bilirubin: 0.4 mg/dL (ref 0.3–1.2)
Total Protein: 6.2 g/dL — ABNORMAL LOW (ref 6.5–8.1)

## 2021-02-03 MED ORDER — DEXAMETHASONE SODIUM PHOSPHATE 100 MG/10ML IJ SOLN
20.0000 mg | Freq: Once | INTRAMUSCULAR | Status: AC
  Administered 2021-02-03: 20 mg via INTRAVENOUS
  Filled 2021-02-03: qty 20

## 2021-02-03 MED ORDER — MONTELUKAST SODIUM 10 MG PO TABS
10.0000 mg | ORAL_TABLET | Freq: Once | ORAL | Status: AC
  Administered 2021-02-03: 10 mg via ORAL

## 2021-02-03 MED ORDER — SODIUM CHLORIDE 0.9 % IV SOLN
Freq: Once | INTRAVENOUS | Status: AC
  Filled 2021-02-03: qty 250

## 2021-02-03 MED ORDER — SODIUM CHLORIDE 0.9 % IV SOLN
16.0000 mg/kg | Freq: Once | INTRAVENOUS | Status: AC
  Administered 2021-02-03: 1100 mg via INTRAVENOUS
  Filled 2021-02-03: qty 15

## 2021-02-03 MED ORDER — DIPHENHYDRAMINE HCL 25 MG PO CAPS
ORAL_CAPSULE | ORAL | Status: AC
  Filled 2021-02-03: qty 2

## 2021-02-03 MED ORDER — ACETAMINOPHEN 325 MG PO TABS
650.0000 mg | ORAL_TABLET | Freq: Once | ORAL | Status: AC
  Administered 2021-02-03: 650 mg via ORAL

## 2021-02-03 MED ORDER — DIPHENHYDRAMINE HCL 25 MG PO CAPS
50.0000 mg | ORAL_CAPSULE | Freq: Once | ORAL | Status: AC
  Administered 2021-02-03: 50 mg via ORAL

## 2021-02-03 MED ORDER — ACETAMINOPHEN 325 MG PO TABS
ORAL_TABLET | ORAL | Status: AC
  Filled 2021-02-03: qty 2

## 2021-02-03 MED ORDER — MONTELUKAST SODIUM 10 MG PO TABS
ORAL_TABLET | ORAL | Status: AC
  Filled 2021-02-03: qty 1

## 2021-02-03 MED ORDER — FAMOTIDINE 20 MG IN NS 100 ML IVPB
INTRAVENOUS | Status: AC
  Filled 2021-02-03: qty 100

## 2021-02-03 MED ORDER — SODIUM CHLORIDE 0.9 % IV SOLN: 16.0000 mg/kg | Freq: Once | INTRAVENOUS | Status: DC

## 2021-02-03 MED ORDER — FAMOTIDINE 20 MG IN NS 100 ML IVPB
20.0000 mg | Freq: Once | INTRAVENOUS | Status: AC
  Administered 2021-02-03: 20 mg via INTRAVENOUS

## 2021-02-03 NOTE — Progress Notes (Signed)
Ok to change to rapid infusion daratumumab per Dr Lorenso Courier

## 2021-02-03 NOTE — Patient Instructions (Signed)
[facility] MEDICAL ONCOLOGY  Discharge Instructions: °Thank you for choosing [facility] to provide your oncology and hematology care.  ° °If you have a lab appointment with the Cancer Center, please go directly to the Cancer Center and check in at the registration area. °  °Wear comfortable clothing and clothing appropriate for easy access to any Portacath or PICC line.  ° °We strive to give you quality time with your provider. You may need to reschedule your appointment if you arrive late (15 or more minutes).  Arriving late affects you and other patients whose appointments are after yours.  Also, if you miss three or more appointments without notifying the office, you may be dismissed from the clinic at the provider’s discretion.    °  °For prescription refill requests, have your pharmacy contact our office and allow 72 hours for refills to be completed.   ° °Today you received the following chemotherapy and/or immunotherapy agents: Daratumumab.     °  °To help prevent nausea and vomiting after your treatment, we encourage you to take your nausea medication as directed. ° °BELOW ARE SYMPTOMS THAT SHOULD BE REPORTED IMMEDIATELY: °*FEVER GREATER THAN 100.4 F (38 °C) OR HIGHER °*CHILLS OR SWEATING °*NAUSEA AND VOMITING THAT IS NOT CONTROLLED WITH YOUR NAUSEA MEDICATION °*UNUSUAL SHORTNESS OF BREATH °*UNUSUAL BRUISING OR BLEEDING °*URINARY PROBLEMS (pain or burning when urinating, or frequent urination) °*BOWEL PROBLEMS (unusual diarrhea, constipation, pain near the anus) °TENDERNESS IN MOUTH AND THROAT WITH OR WITHOUT PRESENCE OF ULCERS (sore throat, sores in mouth, or a toothache) °UNUSUAL RASH, SWELLING OR PAIN  °UNUSUAL VAGINAL DISCHARGE OR ITCHING  ° °Items with * indicate a potential emergency and should be followed up as soon as possible or go to the Emergency Department if any problems should occur. ° °Please show the CHEMOTHERAPY ALERT CARD or IMMUNOTHERAPY ALERT CARD at check-in  to the Emergency Department and triage nurse. ° °Should you have questions after your visit or need to cancel or reschedule your appointment, please contact [facility]COL[facility]  Dept: [phone]  and follow the prompts.  Office hours are 8:00 a.m. to 4:30 p.m. Monday - Friday. Please note that voicemails left after 4:00 p.m. may not be returned until the following business day.  We are closed weekends and major holidays. You have access to a nurse at all times for urgent questions. Please call the main number to the clinic Dept: [phone] and follow the prompts. ° ° °For any non-urgent questions, you may also contact your provider using MyChart. We now offer e-Visits for anyone 18 and older to request care online for non-urgent symptoms. For details visit [email or web address]. °  °Also download the MyChart app! Go to the app store, search "MyChart", open the app, select [facility], and log in with your MyChart username and password. ° °Due to Covid, a mask is required upon entering the hospital/clinic. If you do not have a mask, one will be given to you upon arrival. For doctor visits, patients may have 1 support person aged 18 or older with them. For treatment visits, patients cannot have anyone with them due to current Covid guidelines and our immunocompromised population.  ° °

## 2021-02-07 ENCOUNTER — Telehealth: Payer: Self-pay | Admitting: Hematology

## 2021-02-07 MED FILL — Dexamethasone Sodium Phosphate Inj 100 MG/10ML: INTRAMUSCULAR | Qty: 2 | Status: AC

## 2021-02-07 NOTE — Telephone Encounter (Signed)
Rescheduled upcoming appointment due to provider's emergency. Patient is aware of changes. 

## 2021-02-10 ENCOUNTER — Inpatient Hospital Stay: Payer: Medicare HMO | Admitting: Hematology and Oncology

## 2021-02-10 ENCOUNTER — Inpatient Hospital Stay: Payer: Medicare HMO

## 2021-02-10 ENCOUNTER — Other Ambulatory Visit: Payer: Self-pay

## 2021-02-10 ENCOUNTER — Inpatient Hospital Stay: Payer: Medicare HMO | Admitting: Hematology

## 2021-02-10 ENCOUNTER — Encounter: Payer: Self-pay | Admitting: Hematology and Oncology

## 2021-02-10 VITALS — BP 124/68 | HR 72 | Temp 97.3°F | Resp 16

## 2021-02-10 DIAGNOSIS — Z5112 Encounter for antineoplastic immunotherapy: Secondary | ICD-10-CM | POA: Diagnosis not present

## 2021-02-10 DIAGNOSIS — C9000 Multiple myeloma not having achieved remission: Secondary | ICD-10-CM | POA: Diagnosis not present

## 2021-02-10 DIAGNOSIS — D61818 Other pancytopenia: Secondary | ICD-10-CM | POA: Diagnosis not present

## 2021-02-10 DIAGNOSIS — Z79899 Other long term (current) drug therapy: Secondary | ICD-10-CM | POA: Diagnosis not present

## 2021-02-10 DIAGNOSIS — D696 Thrombocytopenia, unspecified: Secondary | ICD-10-CM | POA: Diagnosis not present

## 2021-02-10 DIAGNOSIS — D709 Neutropenia, unspecified: Secondary | ICD-10-CM | POA: Diagnosis not present

## 2021-02-10 DIAGNOSIS — Z7189 Other specified counseling: Secondary | ICD-10-CM

## 2021-02-10 DIAGNOSIS — Z7982 Long term (current) use of aspirin: Secondary | ICD-10-CM | POA: Diagnosis not present

## 2021-02-10 LAB — CMP (CANCER CENTER ONLY)
ALT: 23 U/L (ref 0–44)
AST: 15 U/L (ref 15–41)
Albumin: 3.5 g/dL (ref 3.5–5.0)
Alkaline Phosphatase: 71 U/L (ref 38–126)
Anion gap: 9 (ref 5–15)
BUN: 11 mg/dL (ref 8–23)
CO2: 22 mmol/L (ref 22–32)
Calcium: 8.9 mg/dL (ref 8.9–10.3)
Chloride: 107 mmol/L (ref 98–111)
Creatinine: 0.76 mg/dL (ref 0.44–1.00)
GFR, Estimated: >60 mL/min (ref >60)
Glucose, Bld: 94 mg/dL (ref 70–99)
Potassium: 3.9 mmol/L (ref 3.5–5.1)
Sodium: 138 mmol/L (ref 135–145)
Total Bilirubin: 0.5 mg/dL (ref 0.3–1.2)
Total Protein: 6.4 g/dL — ABNORMAL LOW (ref 6.5–8.1)

## 2021-02-10 LAB — CBC WITH DIFFERENTIAL (CANCER CENTER ONLY)
Abs Immature Granulocytes: 0.01 10*3/uL (ref 0.00–0.07)
Basophils Absolute: 0 10*3/uL (ref 0.0–0.1)
Basophils Relative: 1 %
Eosinophils Absolute: 0.1 10*3/uL (ref 0.0–0.5)
Eosinophils Relative: 6 %
HCT: 36.4 % (ref 36.0–46.0)
Hemoglobin: 12.4 g/dL (ref 12.0–15.0)
Immature Granulocytes: 0 %
Lymphocytes Relative: 29 %
Lymphs Abs: 0.7 10*3/uL (ref 0.7–4.0)
MCH: 31.9 pg (ref 26.0–34.0)
MCHC: 34.1 g/dL (ref 30.0–36.0)
MCV: 93.6 fL (ref 80.0–100.0)
Monocytes Absolute: 0.5 10*3/uL (ref 0.1–1.0)
Monocytes Relative: 21 %
Neutro Abs: 1 10*3/uL — ABNORMAL LOW (ref 1.7–7.7)
Neutrophils Relative %: 43 %
Platelet Count: 125 10*3/uL — ABNORMAL LOW (ref 150–400)
RBC: 3.89 MIL/uL (ref 3.87–5.11)
RDW: 14.7 % (ref 11.5–15.5)
WBC Count: 2.4 10*3/uL — ABNORMAL LOW (ref 4.0–10.5)
nRBC: 0 % (ref 0.0–0.2)

## 2021-02-10 MED ORDER — DIPHENHYDRAMINE HCL 25 MG PO CAPS
50.0000 mg | ORAL_CAPSULE | Freq: Once | ORAL | Status: AC
  Administered 2021-02-10: 50 mg via ORAL
  Filled 2021-02-10: qty 2

## 2021-02-10 MED ORDER — MONTELUKAST SODIUM 10 MG PO TABS
10.0000 mg | ORAL_TABLET | Freq: Once | ORAL | Status: AC
  Administered 2021-02-10: 10 mg via ORAL
  Filled 2021-02-10: qty 1

## 2021-02-10 MED ORDER — ACETAMINOPHEN 325 MG PO TABS
650.0000 mg | ORAL_TABLET | Freq: Once | ORAL | Status: AC
  Administered 2021-02-10: 650 mg via ORAL
  Filled 2021-02-10: qty 2

## 2021-02-10 MED ORDER — SODIUM CHLORIDE 0.9 % IV SOLN
20.0000 mg | Freq: Once | INTRAVENOUS | Status: AC
  Administered 2021-02-10: 20 mg via INTRAVENOUS
  Filled 2021-02-10: qty 20

## 2021-02-10 MED ORDER — FAMOTIDINE 20 MG IN NS 100 ML IVPB
20.0000 mg | Freq: Once | INTRAVENOUS | Status: AC
  Administered 2021-02-10: 20 mg via INTRAVENOUS
  Filled 2021-02-10: qty 100

## 2021-02-10 MED ORDER — SODIUM CHLORIDE 0.9 % IV SOLN
16.0000 mg/kg | Freq: Once | INTRAVENOUS | Status: AC
  Administered 2021-02-10: 1100 mg via INTRAVENOUS
  Filled 2021-02-10: qty 40

## 2021-02-10 MED ORDER — SODIUM CHLORIDE 0.9 % IV SOLN
Freq: Once | INTRAVENOUS | Status: AC
  Filled 2021-02-10: qty 250

## 2021-02-10 NOTE — Assessment & Plan Note (Signed)
I reviewed her recent myeloma panel She was started on daratumumab recently and tolerated well Her M protein is coming down She has no perceived side effects She is mildly pancytopenic likely due to side effects of Revlimid but remained asymptomatic I recommend dental clearance before we resume either IV bisphosphonates or Xgeva I reminded her the importance of calcium and vitamin D supplement She will continue acyclovir for antimicrobial prophylaxis She will also take aspirin for DVT prophylaxis while on Revlimid

## 2021-02-10 NOTE — Progress Notes (Signed)
[facility] progress notes  Patient Care Team: Harlan Stains, MD as PCP - General (Family Medicine) Brunetta Genera, MD as Consulting Physician (Hematology)  CHIEF COMPLAINTS/PURPOSE OF VISIT:  Multiple myeloma, ongoing treatment  HISTORY OF PRESENTING ILLNESS:  Tracy Fisher 75 y.o. female is seen because her primary oncologist is not available The patient has been placed on Revlimid for some time and recently, daratumumab was added She have no side effects from the addition of daratumumab Denies recent infection, fever or chills No recent bleeding No bone pain She has appointment pending to see her dentist She has not received Prolia since last year  I reviewed the patient's records extensive and collaborated the history with the patient. 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 No pertinent surgical history.  SOCIAL HISTORY: Social History   Socioeconomic History   Marital status: Divorced    Spouse name: Not on file   Number of children: Not on file   Years of education: Not on file   Highest education level: Not on file  Occupational History   Not on file  Tobacco Use   Smoking status: Never   Smokeless tobacco: Never  Vaping Use   Vaping Use: Never used  Substance and Sexual Activity   Alcohol use: No   Drug use: Never    Sexual activity: Not on file  Other Topics Concern   Not on file  Social History Narrative   Not on file   Social Determinants of Health   Financial Resource Strain: Not on file  Food Insecurity: Not on file  Transportation Needs: Not on file  Physical Activity: Not on file  Stress: Not on file  Social Connections: Not on file  Intimate Partner Violence: Not on file    FAMILY HISTORY: Family History  Problem Relation Age of Onset   Breast cancer Neg Hx     ALLERGIES:  is allergic to bortezomib and sulfa antibiotics.  MEDICATIONS:  Current Outpatient Medications  Medication Sig Dispense Refill   acyclovir (ZOVIRAX) 400 MG tablet Take 1 tablet (400 mg total) by mouth 2 (two) times daily. 60 tablet 3   amLODipine (NORVASC) 5 MG tablet      aspirin EC 81 MG tablet Take 81 mg by mouth daily. Swallow whole.     atorvastatin (LIPITOR) 20 MG tablet      b complex vitamins capsule Take 1 capsule by mouth daily. 30 capsule 11   brimonidine (ALPHAGAN) 0.2 % ophthalmic solution Place 1 drop into both eyes 2 (two) times daily.   3   Calcium Carbonate-Vitamin D (CALCIUM + D PO) Take 1 tablet by mouth daily.     dexamethasone (DECADRON) 4 MG tablet Take 5 tab (97m) the day after daratumumab. For cycle 5 & 6 also take the day  after day 8 Velcade & daily x 2d on non-chemo weeks (D15,16). 20 tablet 5   dorzolamide-timolol (COSOPT) 22.3-6.8 MG/ML ophthalmic solution Place 1 drop into both eyes 2 (two) times daily.   12   DULoxetine (CYMBALTA) 60 MG capsule Take 1 capsule (60 mg total) by mouth daily. 30 capsule 2   gabapentin (NEURONTIN) 100 MG capsule Take 2 capsules (200 mg total) by mouth 3 (three) times daily. 90 capsule 2   gabapentin (NEURONTIN) 300 MG capsule Take 1 capsule (300 mg total) by mouth 3 (three) times daily. 90 capsule 2   HYDROcodone-acetaminophen (NORCO) 5-325 MG tablet Take 1 tablet by mouth every 6 (six) hours as needed for moderate pain. 60 tablet 0   lenalidomide  (REVLIMID) 25 MG capsule Take 1 capsule (25 mg total) by mouth daily. Take 21 days on, 7 days off, repeat every 28 days. 21 capsule 3   LUMIGAN 0.01 % SOLN INSTILL 1 DROP INTO EACH EYE ONCE DAILY     metoCLOPramide (REGLAN) 10 MG tablet Take 1 tablet (10 mg total) by mouth every 8 (eight) hours as needed for nausea or vomiting. (Patient not taking: No sig reported) 6 tablet 0   mirtazapine (REMERON) 15 MG tablet Take 15 mg by mouth at bedtime.     Multiple Vitamin (MULTIVITAMIN) capsule Take by mouth.     ondansetron (ZOFRAN) 8 MG tablet Take 1 tablet (8 mg total) by mouth 2 (two) times daily as needed (Nausea or vomiting). 30 tablet 1   ONETOUCH VERIO test strip USE STRIP TO CHECK GLUCOSE TWICE DAILY     potassium chloride SA (KLOR-CON) 20 MEQ tablet Take 1 tablet by mouth twice daily 60 tablet 0   No current facility-administered medications for this visit.   Facility-Administered Medications Ordered in Other Visits  Medication Dose Route Frequency Provider Last Rate Last Admin   daratumumab (DARZALEX) 1,100 mg in sodium chloride 0.9 % 445 mL chemo infusion  16 mg/kg (Treatment Plan Recorded) Intravenous Once Brunetta Genera, MD       dexamethasone (DECADRON) 20 mg in sodium chloride 0.9 % 50 mL IVPB  20 mg Intravenous Once Brunetta Genera, MD 208 mL/hr at 02/10/21 0944 20 mg at 02/10/21 0944   famotidine (PEPCID) IVPB 20 mg in NS 100 mL IVPB  20 mg Intravenous Once Brunetta Genera, MD        REVIEW OF SYSTEMS:   Constitutional: Denies fevers, chills or abnormal night sweats Eyes: Denies blurriness of vision, double vision or watery eyes Ears, nose, mouth, throat, and face: Denies mucositis or sore throat Respiratory: Denies cough, dyspnea or wheezes Cardiovascular: Denies palpitation, chest discomfort or lower extremity swelling Gastrointestinal:  Denies nausea, heartburn or change in bowel habits Skin: Denies abnormal skin rashes Lymphatics: Denies new lymphadenopathy or  easy bruising Neurological:Denies numbness, tingling or new weaknesses Behavioral/Psych: Mood is stable, no new changes  All other systems were reviewed with the patient and are negative.  PHYSICAL EXAMINATION: ECOG PERFORMANCE STATUS: 1 - Symptomatic but completely ambulatory  Vitals:   02/10/21 0820  BP: 134/72  Pulse: 70  Resp: 18  Temp: 97.7 F (36.5 C)  SpO2: 100%   Filed Weights   02/10/21 0820  Weight: 152 lb 3.2 oz (69 kg)    GENERAL:alert, no distress and comfortable PSYCH: alert & oriented x 3 with fluent speech NEURO: no focal motor/sensory deficits  LABORATORY DATA:  I have reviewed the data as listed Lab Results  Component Value Date  WBC 2.4 (L) 02/10/2021   HGB 12.4 02/10/2021   HCT 36.4 02/10/2021   MCV 93.6 02/10/2021   PLT 125 (L) 02/10/2021   Recent Labs    03/06/20 1042 06/12/20 1030 01/27/21 0915 02/03/21 0919 02/10/21 0808  NA 136   < > 140 140 138  K 3.6   < > 4.2 4.2 3.9  CL 106   < > 108 109 107  CO2 25   < > '26 24 22  ' GLUCOSE 101*   < > 95 100* 94  BUN 16   < > '14 13 11  ' CREATININE 0.75   < > 0.86 0.78 0.76  CALCIUM 10.0   < > 9.5 9.4 8.9  GFRNONAA >60   < > >60 >60 >60  GFRAA >60  --   --   --   --   PROT 10.0*   < > 6.6 6.2* 6.4*  ALBUMIN 3.6   < > 3.5 3.4* 3.5  AST 27   < > 16 14* 15  ALT 27   < > '24 22 23  ' ALKPHOS 39   < > 69 70 71  BILITOT 0.3   < > 0.3 0.4 0.5   < > = values in this interval not displayed.    RADIOGRAPHIC STUDIES: I have personally reviewed the radiological images as listed and agreed with the findings in the report. MM 3D SCREEN BREAST BILATERAL  Result Date: 01/16/2021 CLINICAL DATA:  Screening. EXAM: DIGITAL SCREENING BILATERAL MAMMOGRAM WITH TOMOSYNTHESIS AND CAD TECHNIQUE: Bilateral screening digital craniocaudal and mediolateral oblique mammograms were obtained. Bilateral screening digital breast tomosynthesis was performed. The images were evaluated with computer-aided detection. COMPARISON:   Previous exam(s). ACR Breast Density Category b: There are scattered areas of fibroglandular density. FINDINGS: There are no findings suspicious for malignancy. IMPRESSION: No mammographic evidence of malignancy. A result letter of this screening mammogram will be mailed directly to the patient. RECOMMENDATION: Screening mammogram in one year. (Code:SM-B-01Y) BI-RADS CATEGORY  1: Negative. Electronically Signed   By: Margarette Canada M.D.   On: 01/16/2021 13:39    ASSESSMENT & PLAN:  Multiple myeloma ([place]) I reviewed her recent myeloma panel She was started on daratumumab recently and tolerated well Her M protein is coming down She has no perceived side effects She is mildly pancytopenic likely due to side effects of Revlimid but remained asymptomatic I recommend dental clearance before we resume either IV bisphosphonates or Xgeva I reminded her the importance of calcium and vitamin D supplement She will continue acyclovir for antimicrobial prophylaxis She will also take aspirin for DVT prophylaxis while on Revlimid  Pancytopenia, acquired ([place]) She is not symptomatic from pancytopenia We will proceed with treatment without delay  No orders of the defined types were placed in this encounter.   All questions were answered. The patient knows to call the clinic with any problems, questions or concerns. The total time spent in the appointment was 20 minutes encounter with patients including review of chart and various tests results, discussions about plan of care and coordination of care plan   Heath Lark, MD 02/10/2021 9:55 AM

## 2021-02-10 NOTE — Patient Instructions (Signed)
[facility]  Discharge Instructions: °Thank you for choosing [facility] to provide your oncology and hematology care.  ° °If you have a lab appointment with the Cancer Center, please go directly to the Cancer Center and check in at the registration area. °  °Wear comfortable clothing and clothing appropriate for easy access to any Portacath or PICC line.  ° °We strive to give you quality time with your provider. You may need to reschedule your appointment if you arrive late (15 or more minutes).  Arriving late affects you and other patients whose appointments are after yours.  Also, if you miss three or more appointments without notifying the office, you may be dismissed from the clinic at the provider’s discretion.    °  °For prescription refill requests, have your pharmacy contact our office and allow 72 hours for refills to be completed.   ° °Today you received the following chemotherapy and/or immunotherapy agents: Darzalex  °  °To help prevent nausea and vomiting after your treatment, we encourage you to take your nausea medication as directed. ° °BELOW ARE SYMPTOMS THAT SHOULD BE REPORTED IMMEDIATELY: °*FEVER GREATER THAN 100.4 F (38 °C) OR HIGHER °*CHILLS OR SWEATING °*NAUSEA AND VOMITING THAT IS NOT CONTROLLED WITH YOUR NAUSEA MEDICATION °*UNUSUAL SHORTNESS OF BREATH °*UNUSUAL BRUISING OR BLEEDING °*URINARY PROBLEMS (pain or burning when urinating, or frequent urination) °*BOWEL PROBLEMS (unusual diarrhea, constipation, pain near the anus) °TENDERNESS IN MOUTH AND THROAT WITH OR WITHOUT PRESENCE OF ULCERS (sore throat, sores in mouth, or a toothache) °UNUSUAL RASH, SWELLING OR PAIN  °UNUSUAL VAGINAL DISCHARGE OR ITCHING  ° °Items with * indicate a potential emergency and should be followed up as soon as possible or go to the Emergency Department if any problems should occur. ° °Please show the CHEMOTHERAPY ALERT CARD or IMMUNOTHERAPY ALERT CARD at check-in to the  Emergency Department and triage nurse. ° °Should you have questions after your visit or need to cancel or reschedule your appointment, please contact [facility]  Dept: [phone]  and follow the prompts.  Office hours are 8:00 a.m. to 4:30 p.m. Monday - Friday. Please note that voicemails left after 4:00 p.m. may not be returned until the following business day.  We are closed weekends and major holidays. You have access to a nurse at all times for urgent questions. Please call the main number to the clinic Dept: [phone] and follow the prompts. ° ° °For any non-urgent questions, you may also contact your provider using MyChart. We now offer e-Visits for anyone 18 and older to request care online for non-urgent symptoms. For details visit [email or web address]. °  °Also download the MyChart app! Go to the app store, search "MyChart", open the app, select [facility], and log in with your MyChart username and password. ° °Due to Covid, a mask is required upon entering the hospital/clinic. If you do not have a mask, one will be given to you upon arrival. For doctor visits, patients may have 1 support person aged 18 or older with them. For treatment visits, patients cannot have anyone with them due to current Covid guidelines and our immunocompromised population.  ° °

## 2021-02-10 NOTE — Assessment & Plan Note (Signed)
She is not symptomatic from pancytopenia We will proceed with treatment without delay

## 2021-02-11 ENCOUNTER — Other Ambulatory Visit: Payer: Self-pay

## 2021-02-11 DIAGNOSIS — C9000 Multiple myeloma not having achieved remission: Secondary | ICD-10-CM

## 2021-02-11 DIAGNOSIS — Z7189 Other specified counseling: Secondary | ICD-10-CM

## 2021-02-11 MED ORDER — LENALIDOMIDE 25 MG PO CAPS
25.0000 mg | ORAL_CAPSULE | Freq: Every day | ORAL | 3 refills | Status: DC
Start: 2021-02-11 — End: 2021-02-13

## 2021-02-13 ENCOUNTER — Other Ambulatory Visit: Payer: Self-pay | Admitting: Hematology

## 2021-02-13 ENCOUNTER — Other Ambulatory Visit (HOSPITAL_COMMUNITY): Payer: Self-pay

## 2021-02-13 ENCOUNTER — Other Ambulatory Visit: Payer: Self-pay

## 2021-02-13 DIAGNOSIS — C9000 Multiple myeloma not having achieved remission: Secondary | ICD-10-CM

## 2021-02-13 DIAGNOSIS — Z7189 Other specified counseling: Secondary | ICD-10-CM

## 2021-02-13 MED ORDER — HYDROCODONE-ACETAMINOPHEN 5-325 MG PO TABS
1.0000 | ORAL_TABLET | Freq: Four times a day (QID) | ORAL | 0 refills | Status: DC | PRN
  Filled 2021-02-13: qty 60, 15d supply, fill #0

## 2021-02-13 MED ORDER — LENALIDOMIDE 25 MG PO CAPS
25.0000 mg | ORAL_CAPSULE | Freq: Every day | ORAL | 3 refills | Status: DC
Start: 2021-02-13 — End: 2021-03-10

## 2021-02-14 ENCOUNTER — Other Ambulatory Visit (HOSPITAL_COMMUNITY): Payer: Self-pay

## 2021-02-14 ENCOUNTER — Encounter: Payer: Self-pay | Admitting: Hematology

## 2021-02-14 MED FILL — Dexamethasone Sodium Phosphate Inj 100 MG/10ML: INTRAMUSCULAR | Qty: 2 | Status: AC

## 2021-02-17 ENCOUNTER — Inpatient Hospital Stay: Payer: Medicare HMO

## 2021-02-17 ENCOUNTER — Other Ambulatory Visit: Payer: Self-pay

## 2021-02-17 VITALS — BP 164/94 | HR 74 | Temp 98.6°F | Resp 16 | Wt 151.8 lb

## 2021-02-17 DIAGNOSIS — C9000 Multiple myeloma not having achieved remission: Secondary | ICD-10-CM

## 2021-02-17 DIAGNOSIS — D709 Neutropenia, unspecified: Secondary | ICD-10-CM | POA: Diagnosis not present

## 2021-02-17 DIAGNOSIS — D696 Thrombocytopenia, unspecified: Secondary | ICD-10-CM | POA: Diagnosis not present

## 2021-02-17 DIAGNOSIS — Z7982 Long term (current) use of aspirin: Secondary | ICD-10-CM | POA: Diagnosis not present

## 2021-02-17 DIAGNOSIS — Z79899 Other long term (current) drug therapy: Secondary | ICD-10-CM | POA: Diagnosis not present

## 2021-02-17 DIAGNOSIS — Z5112 Encounter for antineoplastic immunotherapy: Secondary | ICD-10-CM | POA: Diagnosis not present

## 2021-02-17 DIAGNOSIS — Z7189 Other specified counseling: Secondary | ICD-10-CM

## 2021-02-17 LAB — CBC WITH DIFFERENTIAL (CANCER CENTER ONLY)
Abs Immature Granulocytes: 0.01 10*3/uL (ref 0.00–0.07)
Basophils Absolute: 0.1 10*3/uL (ref 0.0–0.1)
Basophils Relative: 1 %
Eosinophils Absolute: 0.1 10*3/uL (ref 0.0–0.5)
Eosinophils Relative: 1 %
HCT: 34.5 % — ABNORMAL LOW (ref 36.0–46.0)
Hemoglobin: 12.1 g/dL (ref 12.0–15.0)
Immature Granulocytes: 0 %
Lymphocytes Relative: 21 %
Lymphs Abs: 0.7 10*3/uL (ref 0.7–4.0)
MCH: 32.7 pg (ref 26.0–34.0)
MCHC: 35.1 g/dL (ref 30.0–36.0)
MCV: 93.2 fL (ref 80.0–100.0)
Monocytes Absolute: 0.8 10*3/uL (ref 0.1–1.0)
Monocytes Relative: 23 %
Neutro Abs: 1.9 10*3/uL (ref 1.7–7.7)
Neutrophils Relative %: 54 %
Platelet Count: 174 10*3/uL (ref 150–400)
RBC: 3.7 MIL/uL — ABNORMAL LOW (ref 3.87–5.11)
RDW: 15.4 % (ref 11.5–15.5)
WBC Count: 3.5 10*3/uL — ABNORMAL LOW (ref 4.0–10.5)
nRBC: 0 % (ref 0.0–0.2)

## 2021-02-17 LAB — CMP (CANCER CENTER ONLY)
ALT: 30 U/L (ref 0–44)
AST: 18 U/L (ref 15–41)
Albumin: 3.4 g/dL — ABNORMAL LOW (ref 3.5–5.0)
Alkaline Phosphatase: 71 U/L (ref 38–126)
Anion gap: 7 (ref 5–15)
BUN: 11 mg/dL (ref 8–23)
CO2: 21 mmol/L — ABNORMAL LOW (ref 22–32)
Calcium: 9.2 mg/dL (ref 8.9–10.3)
Chloride: 110 mmol/L (ref 98–111)
Creatinine: 0.75 mg/dL (ref 0.44–1.00)
GFR, Estimated: >60 mL/min (ref >60)
Glucose, Bld: 102 mg/dL — ABNORMAL HIGH (ref 70–99)
Potassium: 4.2 mmol/L (ref 3.5–5.1)
Sodium: 138 mmol/L (ref 135–145)
Total Bilirubin: 0.4 mg/dL (ref 0.3–1.2)
Total Protein: 6.4 g/dL — ABNORMAL LOW (ref 6.5–8.1)

## 2021-02-17 MED ORDER — SODIUM CHLORIDE 0.9 % IV SOLN
20.0000 mg | Freq: Once | INTRAVENOUS | Status: AC
  Administered 2021-02-17: 20 mg via INTRAVENOUS
  Filled 2021-02-17: qty 20

## 2021-02-17 MED ORDER — SODIUM CHLORIDE 0.9 % IV SOLN
16.0000 mg/kg | Freq: Once | INTRAVENOUS | Status: AC
  Administered 2021-02-17: 1100 mg via INTRAVENOUS
  Filled 2021-02-17: qty 15

## 2021-02-17 MED ORDER — SODIUM CHLORIDE 0.9 % IV SOLN: Freq: Once | INTRAVENOUS | Status: AC

## 2021-02-17 MED ORDER — DIPHENHYDRAMINE HCL 25 MG PO CAPS
50.0000 mg | ORAL_CAPSULE | Freq: Once | ORAL | Status: AC
  Administered 2021-02-17: 50 mg via ORAL
  Filled 2021-02-17: qty 2

## 2021-02-17 MED ORDER — FAMOTIDINE 20 MG IN NS 100 ML IVPB
20.0000 mg | Freq: Once | INTRAVENOUS | Status: AC
  Administered 2021-02-17: 20 mg via INTRAVENOUS
  Filled 2021-02-17: qty 100

## 2021-02-17 MED ORDER — ACETAMINOPHEN 325 MG PO TABS
650.0000 mg | ORAL_TABLET | Freq: Once | ORAL | Status: AC
  Administered 2021-02-17: 650 mg via ORAL
  Filled 2021-02-17: qty 2

## 2021-02-17 NOTE — Patient Instructions (Signed)
[facility]  Discharge Instructions: °Thank you for choosing [facility] to provide your oncology and hematology care.  ° °If you have a lab appointment with the Cancer Center, please go directly to the Cancer Center and check in at the registration area. °  °Wear comfortable clothing and clothing appropriate for easy access to any Portacath or PICC line.  ° °We strive to give you quality time with your provider. You may need to reschedule your appointment if you arrive late (15 or more minutes).  Arriving late affects you and other patients whose appointments are after yours.  Also, if you miss three or more appointments without notifying the office, you may be dismissed from the clinic at the provider’s discretion.    °  °For prescription refill requests, have your pharmacy contact our office and allow 72 hours for refills to be completed.   ° °Today you received the following chemotherapy and/or immunotherapy agents: Daratumumab.     °  °To help prevent nausea and vomiting after your treatment, we encourage you to take your nausea medication as directed. ° °BELOW ARE SYMPTOMS THAT SHOULD BE REPORTED IMMEDIATELY: °*FEVER GREATER THAN 100.4 F (38 °C) OR HIGHER °*CHILLS OR SWEATING °*NAUSEA AND VOMITING THAT IS NOT CONTROLLED WITH YOUR NAUSEA MEDICATION °*UNUSUAL SHORTNESS OF BREATH °*UNUSUAL BRUISING OR BLEEDING °*URINARY PROBLEMS (pain or burning when urinating, or frequent urination) °*BOWEL PROBLEMS (unusual diarrhea, constipation, pain near the anus) °TENDERNESS IN MOUTH AND THROAT WITH OR WITHOUT PRESENCE OF ULCERS (sore throat, sores in mouth, or a toothache) °UNUSUAL RASH, SWELLING OR PAIN  °UNUSUAL VAGINAL DISCHARGE OR ITCHING  ° °Items with * indicate a potential emergency and should be followed up as soon as possible or go to the Emergency Department if any problems should occur. ° °Please show the CHEMOTHERAPY ALERT CARD or IMMUNOTHERAPY ALERT CARD at check-in  to the Emergency Department and triage nurse. ° °Should you have questions after your visit or need to cancel or reschedule your appointment, please contact [facility]  Dept: [phone]  and follow the prompts.  Office hours are 8:00 a.m. to 4:30 p.m. Monday - Friday. Please note that voicemails left after 4:00 p.m. may not be returned until the following business day.  We are closed weekends and major holidays. You have access to a nurse at all times for urgent questions. Please call the main number to the clinic Dept: [phone] and follow the prompts. ° ° °For any non-urgent questions, you may also contact your provider using MyChart. We now offer e-Visits for anyone 18 and older to request care online for non-urgent symptoms. For details visit [email or web address]. °  °Also download the MyChart app! Go to the app store, search "MyChart", open the app, select [facility], and log in with your MyChart username and password. ° °Due to Covid, a mask is required upon entering the hospital/clinic. If you do not have a mask, one will be given to you upon arrival. For doctor visits, patients may have 1 support person aged 18 or older with them. For treatment visits, patients cannot have anyone with them due to current Covid guidelines and our immunocompromised population.  ° °

## 2021-02-21 MED FILL — Dexamethasone Sodium Phosphate Inj 100 MG/10ML: INTRAMUSCULAR | Qty: 2 | Status: AC

## 2021-02-24 ENCOUNTER — Inpatient Hospital Stay: Payer: Medicare HMO

## 2021-02-24 ENCOUNTER — Other Ambulatory Visit: Payer: Self-pay

## 2021-02-24 VITALS — BP 130/76 | HR 72 | Temp 97.9°F | Resp 16

## 2021-02-24 DIAGNOSIS — C9000 Multiple myeloma not having achieved remission: Secondary | ICD-10-CM

## 2021-02-24 DIAGNOSIS — Z5112 Encounter for antineoplastic immunotherapy: Secondary | ICD-10-CM | POA: Diagnosis not present

## 2021-02-24 DIAGNOSIS — Z7189 Other specified counseling: Secondary | ICD-10-CM

## 2021-02-24 DIAGNOSIS — Z7982 Long term (current) use of aspirin: Secondary | ICD-10-CM | POA: Diagnosis not present

## 2021-02-24 DIAGNOSIS — D709 Neutropenia, unspecified: Secondary | ICD-10-CM | POA: Diagnosis not present

## 2021-02-24 DIAGNOSIS — Z79899 Other long term (current) drug therapy: Secondary | ICD-10-CM | POA: Diagnosis not present

## 2021-02-24 DIAGNOSIS — D696 Thrombocytopenia, unspecified: Secondary | ICD-10-CM | POA: Diagnosis not present

## 2021-02-24 LAB — CBC WITH DIFFERENTIAL (CANCER CENTER ONLY)
Abs Immature Granulocytes: 0.02 10*3/uL (ref 0.00–0.07)
Basophils Absolute: 0.1 10*3/uL (ref 0.0–0.1)
Basophils Relative: 2 %
Eosinophils Absolute: 0.1 10*3/uL (ref 0.0–0.5)
Eosinophils Relative: 2 %
HCT: 36.8 % (ref 36.0–46.0)
Hemoglobin: 12.4 g/dL (ref 12.0–15.0)
Immature Granulocytes: 1 %
Lymphocytes Relative: 15 %
Lymphs Abs: 0.5 10*3/uL — ABNORMAL LOW (ref 0.7–4.0)
MCH: 32.2 pg (ref 26.0–34.0)
MCHC: 33.7 g/dL (ref 30.0–36.0)
MCV: 95.6 fL (ref 80.0–100.0)
Monocytes Absolute: 0.2 10*3/uL (ref 0.1–1.0)
Monocytes Relative: 7 %
Neutro Abs: 2.4 10*3/uL (ref 1.7–7.7)
Neutrophils Relative %: 73 %
Platelet Count: 160 10*3/uL (ref 150–400)
RBC: 3.85 MIL/uL — ABNORMAL LOW (ref 3.87–5.11)
RDW: 16.4 % — ABNORMAL HIGH (ref 11.5–15.5)
WBC Count: 3.3 10*3/uL — ABNORMAL LOW (ref 4.0–10.5)
nRBC: 0 % (ref 0.0–0.2)

## 2021-02-24 LAB — CMP (CANCER CENTER ONLY)
ALT: 24 U/L (ref 0–44)
AST: 18 U/L (ref 15–41)
Albumin: 3.4 g/dL — ABNORMAL LOW (ref 3.5–5.0)
Alkaline Phosphatase: 77 U/L (ref 38–126)
Anion gap: 10 (ref 5–15)
BUN: 13 mg/dL (ref 8–23)
CO2: 21 mmol/L — ABNORMAL LOW (ref 22–32)
Calcium: 9.3 mg/dL (ref 8.9–10.3)
Chloride: 108 mmol/L (ref 98–111)
Creatinine: 0.82 mg/dL (ref 0.44–1.00)
GFR, Estimated: >60 mL/min (ref >60)
Glucose, Bld: 131 mg/dL — ABNORMAL HIGH (ref 70–99)
Potassium: 3.9 mmol/L (ref 3.5–5.1)
Sodium: 139 mmol/L (ref 135–145)
Total Bilirubin: 0.4 mg/dL (ref 0.3–1.2)
Total Protein: 6.5 g/dL (ref 6.5–8.1)

## 2021-02-24 MED ORDER — SODIUM CHLORIDE 0.9 % IV SOLN: Freq: Once | INTRAVENOUS | Status: AC

## 2021-02-24 MED ORDER — SODIUM CHLORIDE 0.9 % IV SOLN
16.0000 mg/kg | Freq: Once | INTRAVENOUS | Status: AC
  Administered 2021-02-24: 1100 mg via INTRAVENOUS
  Filled 2021-02-24: qty 40

## 2021-02-24 MED ORDER — ACETAMINOPHEN 325 MG PO TABS
650.0000 mg | ORAL_TABLET | Freq: Once | ORAL | Status: AC
  Administered 2021-02-24: 650 mg via ORAL
  Filled 2021-02-24: qty 2

## 2021-02-24 MED ORDER — SODIUM CHLORIDE 0.9 % IV SOLN
20.0000 mg | Freq: Once | INTRAVENOUS | Status: AC
  Administered 2021-02-24: 20 mg via INTRAVENOUS
  Filled 2021-02-24: qty 20

## 2021-02-24 MED ORDER — DIPHENHYDRAMINE HCL 25 MG PO CAPS
50.0000 mg | ORAL_CAPSULE | Freq: Once | ORAL | Status: AC
  Administered 2021-02-24: 50 mg via ORAL
  Filled 2021-02-24: qty 2

## 2021-02-24 MED ORDER — FAMOTIDINE 20 MG IN NS 100 ML IVPB
20.0000 mg | Freq: Once | INTRAVENOUS | Status: AC
  Administered 2021-02-24: 20 mg via INTRAVENOUS
  Filled 2021-02-24: qty 100

## 2021-02-24 NOTE — Patient Instructions (Signed)
[facility]   Discharge Instructions: Thank you for choosing [facility] to provide your oncology and hematology care.   If you have a lab appointment with the Cancer Center, please go directly to the Cancer Center and check in at the registration area.   Wear comfortable clothing and clothing appropriate for easy access to any Portacath or PICC line.   We strive to give you quality time with your provider. You may need to reschedule your appointment if you arrive late (15 or more minutes).  Arriving late affects you and other patients whose appointments are after yours.  Also, if you miss three or more appointments without notifying the office, you may be dismissed from the clinic at the provider's discretion.      For prescription refill requests, have your pharmacy contact our office and allow 72 hours for refills to be completed.    Today you received the following chemotherapy and/or immunotherapy agents: daratumumab      To help prevent nausea and vomiting after your treatment, we encourage you to take your nausea medication as directed.  BELOW ARE SYMPTOMS THAT SHOULD BE REPORTED IMMEDIATELY: *FEVER GREATER THAN 100.4 F (38 C) OR HIGHER *CHILLS OR SWEATING *NAUSEA AND VOMITING THAT IS NOT CONTROLLED WITH YOUR NAUSEA MEDICATION *UNUSUAL SHORTNESS OF BREATH *UNUSUAL BRUISING OR BLEEDING *URINARY PROBLEMS (pain or burning when urinating, or frequent urination) *BOWEL PROBLEMS (unusual diarrhea, constipation, pain near the anus) TENDERNESS IN MOUTH AND THROAT WITH OR WITHOUT PRESENCE OF ULCERS (sore throat, sores in mouth, or a toothache) UNUSUAL RASH, SWELLING OR PAIN  UNUSUAL VAGINAL DISCHARGE OR ITCHING   Items with * indicate a potential emergency and should be followed up as soon as possible or go to the Emergency Department if any problems should occur.  Please show the CHEMOTHERAPY ALERT CARD or IMMUNOTHERAPY ALERT CARD at check-in  to the Emergency Department and triage nurse.  Should you have questions after your visit or need to cancel or reschedule your appointment, please contact [facility]  Dept: [phone]  and follow the prompts.  Office hours are 8:00 a.m. to 4:30 p.m. Monday - Friday. Please note that voicemails left after 4:00 p.m. may not be returned until the following business day.  We are closed weekends and major holidays. You have access to a nurse at all times for urgent questions. Please call the main number to the clinic Dept: [phone] and follow the prompts.   For any non-urgent questions, you may also contact your provider using MyChart. We now offer e-Visits for anyone 18 and older to request care online for non-urgent symptoms. For details visit [email or web address].   Also download the MyChart app! Go to the app store, search "MyChart", open the app, select [facility], and log in with your MyChart username and password.  Due to Covid, a mask is required upon entering the hospital/clinic. If you do not have a mask, one will be given to you upon arrival. For doctor visits, patients may have 1 support person aged 18 or older with them. For treatment visits, patients cannot have anyone with them due to current Covid guidelines and our immunocompromised population.   

## 2021-02-28 DIAGNOSIS — J019 Acute sinusitis, unspecified: Secondary | ICD-10-CM | POA: Diagnosis not present

## 2021-02-28 DIAGNOSIS — J029 Acute pharyngitis, unspecified: Secondary | ICD-10-CM | POA: Diagnosis not present

## 2021-02-28 DIAGNOSIS — R0981 Nasal congestion: Secondary | ICD-10-CM | POA: Diagnosis not present

## 2021-02-28 DIAGNOSIS — Z03818 Encounter for observation for suspected exposure to other biological agents ruled out: Secondary | ICD-10-CM | POA: Diagnosis not present

## 2021-02-28 MED FILL — Dexamethasone Sodium Phosphate Inj 100 MG/10ML: INTRAMUSCULAR | Qty: 2 | Status: AC

## 2021-03-04 ENCOUNTER — Ambulatory Visit: Payer: Medicare HMO

## 2021-03-04 ENCOUNTER — Other Ambulatory Visit: Payer: Self-pay | Admitting: *Deleted

## 2021-03-04 ENCOUNTER — Ambulatory Visit: Payer: Medicare HMO | Admitting: Hematology

## 2021-03-04 ENCOUNTER — Other Ambulatory Visit: Payer: Medicare HMO

## 2021-03-04 MED ORDER — POTASSIUM CHLORIDE CRYS ER 20 MEQ PO TBCR: 20.0000 meq | EXTENDED_RELEASE_TABLET | Freq: Two times a day (BID) | ORAL | 0 refills | Status: DC

## 2021-03-05 DIAGNOSIS — I10 Essential (primary) hypertension: Secondary | ICD-10-CM | POA: Diagnosis not present

## 2021-03-05 DIAGNOSIS — E559 Vitamin D deficiency, unspecified: Secondary | ICD-10-CM | POA: Diagnosis not present

## 2021-03-05 DIAGNOSIS — G620 Drug-induced polyneuropathy: Secondary | ICD-10-CM | POA: Diagnosis not present

## 2021-03-05 DIAGNOSIS — E1169 Type 2 diabetes mellitus with other specified complication: Secondary | ICD-10-CM | POA: Diagnosis not present

## 2021-03-05 DIAGNOSIS — E785 Hyperlipidemia, unspecified: Secondary | ICD-10-CM | POA: Diagnosis not present

## 2021-03-05 DIAGNOSIS — C9000 Multiple myeloma not having achieved remission: Secondary | ICD-10-CM | POA: Diagnosis not present

## 2021-03-05 DIAGNOSIS — R69 Illness, unspecified: Secondary | ICD-10-CM | POA: Diagnosis not present

## 2021-03-07 MED FILL — Dexamethasone Sodium Phosphate Inj 100 MG/10ML: INTRAMUSCULAR | Qty: 2 | Status: AC

## 2021-03-10 ENCOUNTER — Inpatient Hospital Stay (HOSPITAL_BASED_OUTPATIENT_CLINIC_OR_DEPARTMENT_OTHER): Payer: Medicare HMO | Admitting: Hematology

## 2021-03-10 ENCOUNTER — Inpatient Hospital Stay: Payer: Medicare HMO | Attending: Hematology

## 2021-03-10 ENCOUNTER — Other Ambulatory Visit (HOSPITAL_COMMUNITY): Payer: Self-pay

## 2021-03-10 ENCOUNTER — Other Ambulatory Visit: Payer: Self-pay

## 2021-03-10 ENCOUNTER — Encounter: Payer: Self-pay | Admitting: Hematology

## 2021-03-10 ENCOUNTER — Inpatient Hospital Stay: Payer: Medicare HMO

## 2021-03-10 ENCOUNTER — Other Ambulatory Visit: Payer: Medicare HMO

## 2021-03-10 VITALS — BP 126/67 | HR 95 | Temp 97.9°F | Resp 17

## 2021-03-10 VITALS — BP 153/87 | HR 85 | Temp 99.0°F | Resp 16 | Ht 64.0 in | Wt 152.6 lb

## 2021-03-10 DIAGNOSIS — D72819 Decreased white blood cell count, unspecified: Secondary | ICD-10-CM | POA: Diagnosis not present

## 2021-03-10 DIAGNOSIS — Z79899 Other long term (current) drug therapy: Secondary | ICD-10-CM | POA: Insufficient documentation

## 2021-03-10 DIAGNOSIS — Z5112 Encounter for antineoplastic immunotherapy: Secondary | ICD-10-CM | POA: Insufficient documentation

## 2021-03-10 DIAGNOSIS — Z7982 Long term (current) use of aspirin: Secondary | ICD-10-CM | POA: Insufficient documentation

## 2021-03-10 DIAGNOSIS — C9000 Multiple myeloma not having achieved remission: Secondary | ICD-10-CM | POA: Insufficient documentation

## 2021-03-10 DIAGNOSIS — Z7189 Other specified counseling: Secondary | ICD-10-CM

## 2021-03-10 DIAGNOSIS — I10 Essential (primary) hypertension: Secondary | ICD-10-CM | POA: Insufficient documentation

## 2021-03-10 DIAGNOSIS — E119 Type 2 diabetes mellitus without complications: Secondary | ICD-10-CM | POA: Insufficient documentation

## 2021-03-10 LAB — CBC WITH DIFFERENTIAL (CANCER CENTER ONLY)
Abs Immature Granulocytes: 0.01 10*3/uL (ref 0.00–0.07)
Basophils Absolute: 0 10*3/uL (ref 0.0–0.1)
Basophils Relative: 1 %
Eosinophils Absolute: 0.1 10*3/uL (ref 0.0–0.5)
Eosinophils Relative: 5 %
HCT: 32.7 % — ABNORMAL LOW (ref 36.0–46.0)
Hemoglobin: 11.1 g/dL — ABNORMAL LOW (ref 12.0–15.0)
Immature Granulocytes: 0 %
Lymphocytes Relative: 18 %
Lymphs Abs: 0.5 10*3/uL — ABNORMAL LOW (ref 0.7–4.0)
MCH: 32.1 pg (ref 26.0–34.0)
MCHC: 33.9 g/dL (ref 30.0–36.0)
MCV: 94.5 fL (ref 80.0–100.0)
Monocytes Absolute: 0.3 10*3/uL (ref 0.1–1.0)
Monocytes Relative: 11 %
Neutro Abs: 1.8 10*3/uL (ref 1.7–7.7)
Neutrophils Relative %: 65 %
Platelet Count: 171 10*3/uL (ref 150–400)
RBC: 3.46 MIL/uL — ABNORMAL LOW (ref 3.87–5.11)
RDW: 16.4 % — ABNORMAL HIGH (ref 11.5–15.5)
WBC Count: 2.7 10*3/uL — ABNORMAL LOW (ref 4.0–10.5)
nRBC: 0 % (ref 0.0–0.2)

## 2021-03-10 LAB — CMP (CANCER CENTER ONLY)
ALT: 31 U/L (ref 0–44)
AST: 23 U/L (ref 15–41)
Albumin: 3.4 g/dL — ABNORMAL LOW (ref 3.5–5.0)
Alkaline Phosphatase: 96 U/L (ref 38–126)
Anion gap: 10 (ref 5–15)
BUN: 11 mg/dL (ref 8–23)
CO2: 20 mmol/L — ABNORMAL LOW (ref 22–32)
Calcium: 9.3 mg/dL (ref 8.9–10.3)
Chloride: 110 mmol/L (ref 98–111)
Creatinine: 0.75 mg/dL (ref 0.44–1.00)
GFR, Estimated: >60 mL/min (ref >60)
Glucose, Bld: 113 mg/dL — ABNORMAL HIGH (ref 70–99)
Potassium: 3.8 mmol/L (ref 3.5–5.1)
Sodium: 140 mmol/L (ref 135–145)
Total Bilirubin: 0.3 mg/dL (ref 0.3–1.2)
Total Protein: 6.3 g/dL — ABNORMAL LOW (ref 6.5–8.1)

## 2021-03-10 MED ORDER — ACETAMINOPHEN 325 MG PO TABS
650.0000 mg | ORAL_TABLET | Freq: Once | ORAL | Status: AC
  Administered 2021-03-10: 650 mg via ORAL
  Filled 2021-03-10: qty 2

## 2021-03-10 MED ORDER — SODIUM CHLORIDE 0.9 % IV SOLN
20.0000 mg | Freq: Once | INTRAVENOUS | Status: DC
  Filled 2021-03-10: qty 2

## 2021-03-10 MED ORDER — DIPHENHYDRAMINE HCL 25 MG PO CAPS
50.0000 mg | ORAL_CAPSULE | Freq: Once | ORAL | Status: AC
  Administered 2021-03-10: 50 mg via ORAL
  Filled 2021-03-10: qty 2

## 2021-03-10 MED ORDER — FAMOTIDINE 20 MG IN NS 100 ML IVPB
20.0000 mg | Freq: Once | INTRAVENOUS | Status: AC
  Administered 2021-03-10: 20 mg via INTRAVENOUS
  Filled 2021-03-10: qty 100

## 2021-03-10 MED ORDER — SODIUM CHLORIDE 0.9 % IV SOLN
16.0000 mg/kg | Freq: Once | INTRAVENOUS | Status: AC
  Administered 2021-03-10: 1100 mg via INTRAVENOUS
  Filled 2021-03-10: qty 40

## 2021-03-10 MED ORDER — SODIUM CHLORIDE 0.9 % IV SOLN
20.0000 mg | Freq: Once | INTRAVENOUS | Status: AC
  Administered 2021-03-10: 20 mg via INTRAVENOUS
  Filled 2021-03-10: qty 20

## 2021-03-10 MED ORDER — DEXAMETHASONE 4 MG PO TABS
ORAL_TABLET | ORAL | 5 refills | Status: DC
  Filled 2021-03-10: qty 20, 10d supply, fill #0

## 2021-03-10 MED ORDER — SODIUM CHLORIDE 0.9 % IV SOLN: Freq: Once | INTRAVENOUS | Status: AC

## 2021-03-10 MED ORDER — LENALIDOMIDE 25 MG PO CAPS: 25.0000 mg | ORAL_CAPSULE | Freq: Every day | ORAL | 3 refills | Status: DC

## 2021-03-10 NOTE — Patient Instructions (Signed)
[facility]  Discharge Instructions: Thank you for choosing [facility] to provide your oncology and hematology care.   If you have a lab appointment with the [facility], please go directly to the [facility] and check in at the registration area.   Wear comfortable clothing and clothing appropriate for easy access to any Portacath or PICC line.   We strive to give you quality time with your provider. You may need to reschedule your appointment if you arrive late (15 or more minutes).  Arriving late affects you and other patients whose appointments are after yours.  Also, if you miss three or more appointments without notifying the office, you may be dismissed from the clinic at the provider's discretion.      For prescription refill requests, have your pharmacy contact our office and allow 72 hours for refills to be completed.    Today you received the following chemotherapy and/or immunotherapy agents Darzalex      To help prevent nausea and vomiting after your treatment, we encourage you to take your nausea medication as directed.  BELOW ARE SYMPTOMS THAT SHOULD BE REPORTED IMMEDIATELY: *FEVER GREATER THAN 100.4 F (38 C) OR HIGHER *CHILLS OR SWEATING *NAUSEA AND VOMITING THAT IS NOT CONTROLLED WITH YOUR NAUSEA MEDICATION *UNUSUAL SHORTNESS OF BREATH *UNUSUAL BRUISING OR BLEEDING *URINARY PROBLEMS (pain or burning when urinating, or frequent urination) *BOWEL PROBLEMS (unusual diarrhea, constipation, pain near the anus) TENDERNESS IN MOUTH AND THROAT WITH OR WITHOUT PRESENCE OF ULCERS (sore throat, sores in mouth, or a toothache) UNUSUAL RASH, SWELLING OR PAIN  UNUSUAL VAGINAL DISCHARGE OR ITCHING   Items with * indicate a potential emergency and should be followed up as soon as possible or go to the Emergency Department if any problems should occur.  Please show the CHEMOTHERAPY ALERT CARD or IMMUNOTHERAPY ALERT CARD at check-in to  the Emergency Department and triage nurse.  Should you have questions after your visit or need to cancel or reschedule your appointment, please contact [facility]  Dept: [phone]  and follow the prompts.  Office hours are 8:00 a.m. to 4:30 p.m. Monday - Friday. Please note that voicemails left after 4:00 p.m. may not be returned until the following business day.  We are closed weekends and major holidays. You have access to a nurse at all times for urgent questions. Please call the main number to the clinic Dept: [phone] and follow the prompts.   For any non-urgent questions, you may also contact your provider using MyChart. We now offer e-Visits for anyone 24 and older to request care online for non-urgent symptoms. For details visit [email or web address].   Also download the MyChart app! Go to the app store, search "MyChart", open the app, select , and log in with your MyChart username and password.  Due to Covid, a mask is required upon entering the hospital/clinic. If you do not have a mask, one will be given to you upon arrival. For doctor visits, patients may have 1 support person aged 42 or older with them. For treatment visits, patients cannot have anyone with them due to current Covid guidelines and our immunocompromised population.   Daratumumab injection What is this medication? DARATUMUMAB (dar a toom ue mab) is a monoclonal antibody. It is used to treat multiple myeloma. This medicine may be used for other purposes; ask your health care provider or pharmacist if you have questions. COMMON BRAND NAME(S): DARZALEX What should I  tell my care team before I take this medication? They need to know if you have any of these conditions: hereditary fructose intolerance infection (especially a virus infection such as chickenpox, herpes, or hepatitis B virus) lung or breathing disease (asthma, COPD) an unusual or allergic reaction to  daratumumab, sorbitol, other medicines, foods, dyes, or preservatives pregnant or trying to get pregnant breast-feeding How should I use this medication? This medicine is for infusion into a vein. It is given by a health care professional in a hospital or clinic setting. Talk to your pediatrician regarding the use of this medicine in children. Special care may be needed. Overdosage: If you think you have taken too much of this medicine contact a poison control center or emergency room at once. NOTE: This medicine is only for you. Do not share this medicine with others. What if I miss a dose? Keep appointments for follow-up doses as directed. It is important not to miss your dose. Call your doctor or health care professional if you are unable to keep an appointment. What may interact with this medication? Interactions have not been studied. This list may not describe all possible interactions. Give your health care provider a list of all the medicines, herbs, non-prescription drugs, or dietary supplements you use. Also tell them if you smoke, drink alcohol, or use illegal drugs. Some items may interact with your medicine. What should I watch for while using this medication? Your condition will be monitored carefully while you are receiving this medicine. This medicine can cause serious allergic reactions. To reduce your risk, your health care provider may give you other medicine to take before receiving this one. Be sure to follow the directions from your health care provider. This medicine can affect the results of blood tests to match your blood type. These changes can last for up to 6 months after the final dose. Your healthcare provider will do blood tests to match your blood type before you start treatment. Tell all of your healthcare providers that you are being treated with this medicine before receiving a blood transfusion. This medicine can affect the results of some tests used to determine  treatment response; extra tests may be needed to evaluate response. Do not become pregnant while taking this medicine or for 3 months after stopping it. Women should inform their health care provider if they wish to become pregnant or think they might be pregnant. There is a potential for serious side effects to an unborn child. Talk to your health care provider for more information. Do not breast-feed an infant while taking this medicine. What side effects may I notice from receiving this medication? Side effects that you should report to your doctor or health care professional as soon as possible: allergic reactions (skin rash, itching, hives, swelling of the face, lips, or tongue) blurred vision infection (fever, chills, cough, sore throat, pain or difficulty passing urine) infusion reaction (dizziness, fast heartbeat, feeling faint or lightheaded, falls, headache, increase in blood pressure, nausea, vomiting, or wheezing or trouble breathing with loud or whistling sounds) unusual bleeding or bruising Side effects that usually do not require medical attention (report to your doctor or health care professional if they continue or are bothersome): constipation diarrhea pain, tingling, numbness in the hands or feet swelling of the ankles, feet, hands tiredness This list may not describe all possible side effects. Call your doctor for medical advice about side effects. You may report side effects to FDA at 1-800-FDA-1088. Where should I keep  my medication? This drug is given in a hospital or clinic and will not be stored at home. NOTE: This sheet is a summary. It may not cover all possible information. If you have questions about this medicine, talk to your doctor, pharmacist, or health care provider.  2022 Elsevier/Gold Standard (2020-07-25 12:50:38)   ASK YOUR DENTIST IF IT IS OK FOR YOU TO START TAKING PROLIA.

## 2021-03-11 ENCOUNTER — Telehealth: Payer: Self-pay | Admitting: Hematology

## 2021-03-11 NOTE — Telephone Encounter (Signed)
Scheduled follow-up appointment per 9/12 los. Patient is aware.

## 2021-03-14 ENCOUNTER — Other Ambulatory Visit: Payer: Self-pay | Admitting: Hematology

## 2021-03-14 ENCOUNTER — Other Ambulatory Visit (HOSPITAL_COMMUNITY): Payer: Self-pay

## 2021-03-14 DIAGNOSIS — C9000 Multiple myeloma not having achieved remission: Secondary | ICD-10-CM

## 2021-03-14 MED FILL — Dexamethasone Sodium Phosphate Inj 100 MG/10ML: INTRAMUSCULAR | Qty: 2 | Status: AC

## 2021-03-16 ENCOUNTER — Encounter: Payer: Self-pay | Admitting: Hematology

## 2021-03-16 NOTE — Progress Notes (Signed)
[facility] progress notes  Patient Care Team: Harlan Stains, MD as PCP - General (Family Medicine) Brunetta Genera, MD as Consulting Physician (Hematology)  CHIEF COMPLAINTS/PURPOSE OF VISIT:  Multiple myeloma, ongoing treatment  HISTORY OF PRESENTING ILLNESS: Please see previous note  INTERVAL HISTORY  Tracy Fisher is a wonderful 75 y.o. female who is here for follow-up of her myeloma. The patient's last visit with [place] was on 01/27/2021. The pt reports that she is doing well overall. She is here for C2D15 of Daratumumab.  The pt reports no specific toxicities from her daratumumab or her Revlimid . Mild muscle cramps . No significant diarrhea or skin rashes .  Lab results today 03/10/2021 of CBC w/diff and CMP reviewed    On review of systems, pt reports no other acute new symptoms. She notes that she has been staying active and feels well and her neuropathy is resolving.    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  SOCIAL HISTORY: Social History   Socioeconomic History   Marital status: Divorced    Spouse name: Not on file   Number of children: Not on file   Years of education: Not on file   Highest education level: Not on file  Occupational History   Not on file  Tobacco Use   Smoking status: Never   Smokeless tobacco: Never  Vaping Use   Vaping Use:  Never used  Substance and Sexual Activity   Alcohol use: No   Drug use: Never   Sexual activity: Not on file  Other Topics Concern   Not on file  Social History Narrative   Not on file   Social Determinants of Health   Financial Resource Strain: Not on file  Food Insecurity: Not on file  Transportation Needs: Not on file  Physical Activity: Not on file  Stress: Not on file  Social Connections: Not on file  Intimate Partner Violence: Not on file    FAMILY HISTORY: Family History  Problem Relation Age of Onset   Breast cancer Neg Hx     ALLERGIES:  is allergic to bortezomib and sulfa antibiotics.  MEDICATIONS:  Current Outpatient Medications  Medication Sig Dispense Refill   acyclovir (ZOVIRAX) 400 MG tablet Take 1 tablet (400 mg total) by mouth 2 (two) times daily. 60 tablet 3   amLODipine (NORVASC) 5 MG tablet      aspirin EC 81 MG tablet Take 81 mg by mouth daily. Swallow whole.     atorvastatin (LIPITOR) 20 MG tablet      b complex vitamins capsule Take 1 capsule by mouth daily. 30 capsule 11   brimonidine (ALPHAGAN) 0.2 % ophthalmic solution Place 1 drop into both eyes 2 (two) times daily.   3   Calcium Carbonate-Vitamin D (CALCIUM + D PO) Take 1 tablet  by mouth daily.     dexamethasone (DECADRON) 4 MG tablet Take 5 tablets the day after daratumumab for cycle 5 & 6-- also take the day after day 8 Velcade and take daily for 2 days on non-chemo weeks (days 15 and 16). 20 tablet 5   dorzolamide-timolol (COSOPT) 22.3-6.8 MG/ML ophthalmic solution Place 1 drop into both eyes 2 (two) times daily.   12   DULoxetine (CYMBALTA) 60 MG capsule Take 1 capsule (60 mg total) by mouth daily. 30 capsule 2   gabapentin (NEURONTIN) 100 MG capsule Take 2 capsules (200 mg total) by mouth 3 (three) times daily. 90 capsule 2   gabapentin (NEURONTIN) 300 MG capsule TAKE 1 CAPSULE BY MOUTH THREE TIMES DAILY 90 capsule 0   HYDROcodone-acetaminophen (NORCO) 5-325 MG tablet Take 1 tablet by mouth  every 6 (six) hours as needed for moderate pain. 60 tablet 0   lenalidomide (REVLIMID) 25 MG capsule Take 1 capsule (25 mg total) by mouth daily. Take 21 days on, 7 days off, repeat every 28 days. 21 capsule 3   LUMIGAN 0.01 % SOLN INSTILL 1 DROP INTO EACH EYE ONCE DAILY     metoCLOPramide (REGLAN) 10 MG tablet Take 1 tablet (10 mg total) by mouth every 8 (eight) hours as needed for nausea or vomiting. (Patient not taking: No sig reported) 6 tablet 0   mirtazapine (REMERON) 15 MG tablet Take 15 mg by mouth at bedtime.     Multiple Vitamin (MULTIVITAMIN) capsule Take by mouth.     ondansetron (ZOFRAN) 8 MG tablet Take 1 tablet (8 mg total) by mouth 2 (two) times daily as needed (Nausea or vomiting). 30 tablet 1   ONETOUCH VERIO test strip USE STRIP TO CHECK GLUCOSE TWICE DAILY     potassium chloride SA (KLOR-CON) 20 MEQ tablet Take 1 tablet (20 mEq total) by mouth 2 (two) times daily. 60 tablet 0   No current facility-administered medications for this visit.    REVIEW OF SYSTEMS:  .10 Point review of Systems was done is negative except as noted above.   PHYSICAL EXAMINATION: ECOG PERFORMANCE STATUS: 1 - Symptomatic but completely ambulatory  Vitals:   03/10/21 0901  BP: (!) 153/87  Pulse: 85  Resp: 16  Temp: 99 F (37.2 C)  SpO2: 99%   Filed Weights   03/10/21 0901  Weight: 152 lb 9.6 oz (69.2 kg)  . GENERAL:alert, in no acute distress and comfortable SKIN: no acute rashes, no significant lesions EYES: conjunctiva are pink and non-injected, sclera anicteric OROPHARYNX: MMM, no exudates, no oropharyngeal erythema or ulceration NECK: supple, no JVD LYMPH:  no palpable lymphadenopathy in the cervical, axillary or inguinal regions LUNGS: clear to auscultation b/l with normal respiratory effort HEART: regular rate & rhythm ABDOMEN:  normoactive bowel sounds , non tender, not distended. Extremity: no pedal edema PSYCH: alert & oriented x 3 with fluent speech NEURO: no focal  motor/sensory deficits   LABORATORY DATA:   . CBC Latest Ref Rng & Units 03/10/2021 02/24/2021 02/17/2021  WBC 4.0 - 10.5 K/uL 2.7(L) 3.3(L) 3.5(L)  Hemoglobin 12.0 - 15.0 g/dL 11.1(L) 12.4 12.1  Hematocrit 36.0 - 46.0 % 32.7(L) 36.8 34.5(L)  Platelets 150 - 400 K/uL 171 160 174   . CMP Latest Ref Rng & Units 03/10/2021 02/24/2021 02/17/2021  Glucose 70 - 99 mg/dL 113(H) 131(H) 102(H)  BUN 8 - 23 mg/dL '11 13 11  ' Creatinine 0.44 - 1.00 mg/dL 0.75 0.82 0.75  Sodium 135 - 145 mmol/L 140 139  138  Potassium 3.5 - 5.1 mmol/L 3.8 3.9 4.2  Chloride 98 - 111 mmol/L 110 108 110  CO2 22 - 32 mmol/L 20(L) 21(L) 21(L)  Calcium 8.9 - 10.3 mg/dL 9.3 9.3 9.2  Total Protein 6.5 - 8.1 g/dL 6.3(L) 6.5 6.4(L)  Total Bilirubin 0.3 - 1.2 mg/dL 0.3 0.4 0.4  Alkaline Phos 38 - 126 U/L 96 77 71  AST 15 - 41 U/L '23 18 18  ' ALT 0 - 44 U/L '31 24 30     ' RADIOGRAPHIC STUDIES: I have personally reviewed the radiological images as listed and agreed with the findings in the report. No results found.  ASSESSMENT & PLAN:   75 y.o. female with   #1 IgG kappa multiple myeloma.   Initially presented as smoldering myeloma and had M spike of 1.9 g/dL which was noted to be IgG kappa on immunofixation electrophoresis. -Bone survey shows no overt evidence of osseous involvement suggestive of multiple myeloma. Beta 2 microglobulin, sedimentation rate an LDH levels are within normal limits. -Bone marrow biopsy shows previously showed 17% monoclonal plasma cells Cytogenetics/MolCy - trisomy 79 (consistent with myeloma)   -Bone study from 09/27/17 showed some osteopenic changes without osteoporosis. Previous bone studies are not yet available  -09/08/2019 FISH Panel revealed "No Mutations Detected". -09/08/2019 Cytogenetics revealed "Normal female karyotype". -09/08/2019 BM Bx Report ([id number])  revealed "BONE MARROW, ASPIRATE, CLOT, CORE: -Hypercellular bone marrow for age with plasma cell neoplasm PERIPHERAL BLOOD:  -Macrocytic anemia -Leukopenia." - 20% Plasma Cells  -09/15/2019 PET/CT ([id number]) revealed "1. No findings of active myeloma or other hypermetabolic malignancy. 2.  Aortic Atherosclerosis (ICD10-I70.0). 3. Photopenic hypodense hepatic lesions most compatible with cysts."   Now with active Multiple myeloma Bone Marrow Biopsy with nearly 60% plasma cells. With M spike of 3.6g/dl Starting to develop mild anemia (does not meet anemia criteria yet) No hypercalcemia, renal insufficiency  PET/CT 07/31/2020 - No abnormal hypermetabolism in the neck, chest, abdomen or pelvis.   #2  Chronic leukopenia with intermittent neutropenia. -B12 and folate levels within normal limits. 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  -Reasonable to take a daily vitamin B complex tablet. -No indication for G-CSF at this time. -Now also additionally possibly due to Revlimid.   PLAN: -Discussed pt labwork today, 01/27/2021; labs reviewed- stable Cbc with mild leucopenia andanemia-- likely from Revlimid -- will continue to monitor. -The pt has no prohibitive toxicities from continuing [place] at this time. Will continue to monitor. -will continue Dara with same supportive medications. -Continue Vitamin B Complex daily. -Continue Cymbalta.   FOLLOW UP: Today is cycle 2-day 15 of Dara Please schedule cycle 2-day 22 of diarrhea with port flush and labs Please schedule cycle 3 of daratumumab as per orders. Port flush and labs with each treatment. MD visit with cycle 3-day 15 in 4 weeks     All questions were answered. The patient knows to call the clinic with any problems, questions or concerns.  . The total time spent in the appointment was 31 minutes and more than 50% was on counseling and direct patient cares, ordering and management of systemic immunotherapy.  Sullivan Lone MD MS Hematology/Oncology Physician [facility]

## 2021-03-17 ENCOUNTER — Inpatient Hospital Stay: Payer: Medicare HMO

## 2021-03-17 ENCOUNTER — Other Ambulatory Visit: Payer: Self-pay

## 2021-03-17 VITALS — BP 121/66 | HR 81 | Temp 98.4°F | Resp 17 | Ht 64.0 in | Wt 150.2 lb

## 2021-03-17 DIAGNOSIS — C9000 Multiple myeloma not having achieved remission: Secondary | ICD-10-CM

## 2021-03-17 DIAGNOSIS — Z79899 Other long term (current) drug therapy: Secondary | ICD-10-CM | POA: Diagnosis not present

## 2021-03-17 DIAGNOSIS — Z7189 Other specified counseling: Secondary | ICD-10-CM

## 2021-03-17 DIAGNOSIS — E119 Type 2 diabetes mellitus without complications: Secondary | ICD-10-CM | POA: Diagnosis not present

## 2021-03-17 DIAGNOSIS — Z5112 Encounter for antineoplastic immunotherapy: Secondary | ICD-10-CM | POA: Diagnosis not present

## 2021-03-17 DIAGNOSIS — I10 Essential (primary) hypertension: Secondary | ICD-10-CM | POA: Diagnosis not present

## 2021-03-17 DIAGNOSIS — Z7982 Long term (current) use of aspirin: Secondary | ICD-10-CM | POA: Diagnosis not present

## 2021-03-17 DIAGNOSIS — D72819 Decreased white blood cell count, unspecified: Secondary | ICD-10-CM | POA: Diagnosis not present

## 2021-03-17 LAB — CMP (CANCER CENTER ONLY)
ALT: 20 U/L (ref 0–44)
AST: 17 U/L (ref 15–41)
Albumin: 3.3 g/dL — ABNORMAL LOW (ref 3.5–5.0)
Alkaline Phosphatase: 85 U/L (ref 38–126)
Anion gap: 9 (ref 5–15)
BUN: 18 mg/dL (ref 8–23)
CO2: 21 mmol/L — ABNORMAL LOW (ref 22–32)
Calcium: 9.2 mg/dL (ref 8.9–10.3)
Chloride: 109 mmol/L (ref 98–111)
Creatinine: 0.84 mg/dL (ref 0.44–1.00)
GFR, Estimated: >60 mL/min (ref >60)
Glucose, Bld: 135 mg/dL — ABNORMAL HIGH (ref 70–99)
Potassium: 4.3 mmol/L (ref 3.5–5.1)
Sodium: 139 mmol/L (ref 135–145)
Total Bilirubin: 0.4 mg/dL (ref 0.3–1.2)
Total Protein: 6.4 g/dL — ABNORMAL LOW (ref 6.5–8.1)

## 2021-03-17 LAB — CBC WITH DIFFERENTIAL/PLATELET
Abs Immature Granulocytes: 0.02 10*3/uL (ref 0.00–0.07)
Basophils Absolute: 0.1 10*3/uL (ref 0.0–0.1)
Basophils Relative: 2 %
Eosinophils Absolute: 0.1 10*3/uL (ref 0.0–0.5)
Eosinophils Relative: 4 %
HCT: 35.8 % — ABNORMAL LOW (ref 36.0–46.0)
Hemoglobin: 12.2 g/dL (ref 12.0–15.0)
Immature Granulocytes: 1 %
Lymphocytes Relative: 34 %
Lymphs Abs: 1.3 10*3/uL (ref 0.7–4.0)
MCH: 32.2 pg (ref 26.0–34.0)
MCHC: 34.1 g/dL (ref 30.0–36.0)
MCV: 94.5 fL (ref 80.0–100.0)
Monocytes Absolute: 0.8 10*3/uL (ref 0.1–1.0)
Monocytes Relative: 21 %
Neutro Abs: 1.5 10*3/uL — ABNORMAL LOW (ref 1.7–7.7)
Neutrophils Relative %: 38 %
Platelets: 214 10*3/uL (ref 150–400)
RBC: 3.79 MIL/uL — ABNORMAL LOW (ref 3.87–5.11)
RDW: 16.9 % — ABNORMAL HIGH (ref 11.5–15.5)
WBC: 3.8 10*3/uL — ABNORMAL LOW (ref 4.0–10.5)
nRBC: 0 % (ref 0.0–0.2)

## 2021-03-17 MED ORDER — SODIUM CHLORIDE 0.9 % IV SOLN
20.0000 mg | Freq: Once | INTRAVENOUS | Status: AC
  Administered 2021-03-17: 20 mg via INTRAVENOUS
  Filled 2021-03-17: qty 2
  Filled 2021-03-17: qty 20

## 2021-03-17 MED ORDER — ACETAMINOPHEN 325 MG PO TABS
650.0000 mg | ORAL_TABLET | Freq: Once | ORAL | Status: AC
  Administered 2021-03-17: 650 mg via ORAL
  Filled 2021-03-17: qty 2

## 2021-03-17 MED ORDER — SODIUM CHLORIDE 0.9 % IV SOLN: Freq: Once | INTRAVENOUS | Status: AC

## 2021-03-17 MED ORDER — SODIUM CHLORIDE 0.9 % IV SOLN
16.0000 mg/kg | Freq: Once | INTRAVENOUS | Status: AC
  Administered 2021-03-17: 1100 mg via INTRAVENOUS
  Filled 2021-03-17: qty 40

## 2021-03-17 MED ORDER — MONTELUKAST SODIUM 10 MG PO TABS
10.0000 mg | ORAL_TABLET | Freq: Once | ORAL | Status: AC
  Administered 2021-03-17: 10 mg via ORAL
  Filled 2021-03-17: qty 1

## 2021-03-17 MED ORDER — FAMOTIDINE 20 MG IN NS 100 ML IVPB
20.0000 mg | Freq: Once | INTRAVENOUS | Status: AC
  Administered 2021-03-17: 20 mg via INTRAVENOUS
  Filled 2021-03-17: qty 100

## 2021-03-17 MED ORDER — DIPHENHYDRAMINE HCL 25 MG PO CAPS
50.0000 mg | ORAL_CAPSULE | Freq: Once | ORAL | Status: AC
  Administered 2021-03-17: 50 mg via ORAL
  Filled 2021-03-17: qty 2

## 2021-03-17 NOTE — Patient Instructions (Signed)
[facility]COL[facility]  Discharge Instructions: °Thank you for choosing [facility] to provide your oncology and hematology care.  ° °If you have a lab appointment with the Cancer Center, please go directly to the Cancer Center and check in at the registration area. °  °Wear comfortable clothing and clothing appropriate for easy access to any Portacath or PICC line.  ° °We strive to give you quality time with your provider. You may need to reschedule your appointment if you arrive late (15 or more minutes).  Arriving late affects you and other patients whose appointments are after yours.  Also, if you miss three or more appointments without notifying the office, you may be dismissed from the clinic at the provider’s discretion.    °  °For prescription refill requests, have your pharmacy contact our office and allow 72 hours for refills to be completed.   ° °Today you received the following chemotherapy and/or immunotherapy agents: Daratumumab.     °  °To help prevent nausea and vomiting after your treatment, we encourage you to take your nausea medication as directed. ° °BELOW ARE SYMPTOMS THAT SHOULD BE REPORTED IMMEDIATELY: °*FEVER GREATER THAN 100.4 F (38 °C) OR HIGHER °*CHILLS OR SWEATING °*NAUSEA AND VOMITING THAT IS NOT CONTROLLED WITH YOUR NAUSEA MEDICATION °*UNUSUAL SHORTNESS OF BREATH °*UNUSUAL BRUISING OR BLEEDING °*URINARY PROBLEMS (pain or burning when urinating, or frequent urination) °*BOWEL PROBLEMS (unusual diarrhea, constipation, pain near the anus) °TENDERNESS IN MOUTH AND THROAT WITH OR WITHOUT PRESENCE OF ULCERS (sore throat, sores in mouth, or a toothache) °UNUSUAL RASH, SWELLING OR PAIN  °UNUSUAL VAGINAL DISCHARGE OR ITCHING  ° °Items with * indicate a potential emergency and should be followed up as soon as possible or go to the Emergency Department if any problems should occur. ° °Please show the CHEMOTHERAPY ALERT CARD or IMMUNOTHERAPY ALERT CARD at check-in  to the Emergency Department and triage nurse. ° °Should you have questions after your visit or need to cancel or reschedule your appointment, please contact [facility]  Dept: [phone]  and follow the prompts.  Office hours are 8:00 a.m. to 4:30 p.m. Monday - Friday. Please note that voicemails left after 4:00 p.m. may not be returned until the following business day.  We are closed weekends and major holidays. You have access to a nurse at all times for urgent questions. Please call the main number to the clinic Dept: [phone] and follow the prompts. ° ° °For any non-urgent questions, you may also contact your provider using MyChart. We now offer e-Visits for anyone 18 and older to request care online for non-urgent symptoms. For details visit [email or web address]. °  °Also download the MyChart app! Go to the app store, search "MyChart", open the app, select [facility], and log in with your MyChart username and password. ° °Due to Covid, a mask is required upon entering the hospital/clinic. If you do not have a mask, one will be given to you upon arrival. For doctor visits, patients may have 1 support person aged 18 or older with them. For treatment visits, patients cannot have anyone with them due to current Covid guidelines and our immunocompromised population.  ° °

## 2021-03-18 ENCOUNTER — Encounter: Payer: Self-pay | Admitting: Hematology

## 2021-03-18 MED ORDER — HYDROCODONE-ACETAMINOPHEN 5-325 MG PO TABS: 1.0000 | ORAL_TABLET | Freq: Four times a day (QID) | ORAL | 0 refills | Status: DC | PRN

## 2021-03-21 LAB — MULTIPLE MYELOMA PANEL, SERUM
Albumin SerPl Elph-Mcnc: 3.2 g/dL (ref 2.9–4.4)
Albumin/Glob SerPl: 1.3 (ref 0.7–1.7)
Alpha 1: 0.3 g/dL (ref 0.0–0.4)
Alpha2 Glob SerPl Elph-Mcnc: 1 g/dL (ref 0.4–1.0)
B-Globulin SerPl Elph-Mcnc: 0.8 g/dL (ref 0.7–1.3)
Gamma Glob SerPl Elph-Mcnc: 0.5 g/dL (ref 0.4–1.8)
Globulin, Total: 2.6 g/dL (ref 2.2–3.9)
IgA: 16 mg/dL — ABNORMAL LOW (ref 64–422)
IgG (Immunoglobin G), Serum: 570 mg/dL — ABNORMAL LOW (ref 586–1602)
IgM (Immunoglobulin M), Srm: 18 mg/dL — ABNORMAL LOW (ref 26–217)
M Protein SerPl Elph-Mcnc: 0.2 g/dL — ABNORMAL HIGH
Total Protein ELP: 5.8 g/dL — ABNORMAL LOW (ref 6.0–8.5)

## 2021-03-21 MED FILL — Dexamethasone Sodium Phosphate Inj 100 MG/10ML: INTRAMUSCULAR | Qty: 2 | Status: AC

## 2021-03-24 ENCOUNTER — Inpatient Hospital Stay: Payer: Medicare HMO

## 2021-03-24 ENCOUNTER — Other Ambulatory Visit: Payer: Self-pay

## 2021-03-24 VITALS — BP 108/62 | HR 75 | Temp 98.1°F | Resp 18 | Wt 153.2 lb

## 2021-03-24 DIAGNOSIS — C9000 Multiple myeloma not having achieved remission: Secondary | ICD-10-CM

## 2021-03-24 DIAGNOSIS — Z7982 Long term (current) use of aspirin: Secondary | ICD-10-CM | POA: Diagnosis not present

## 2021-03-24 DIAGNOSIS — E119 Type 2 diabetes mellitus without complications: Secondary | ICD-10-CM | POA: Diagnosis not present

## 2021-03-24 DIAGNOSIS — D72819 Decreased white blood cell count, unspecified: Secondary | ICD-10-CM | POA: Diagnosis not present

## 2021-03-24 DIAGNOSIS — Z5112 Encounter for antineoplastic immunotherapy: Secondary | ICD-10-CM | POA: Diagnosis not present

## 2021-03-24 DIAGNOSIS — Z79899 Other long term (current) drug therapy: Secondary | ICD-10-CM | POA: Diagnosis not present

## 2021-03-24 DIAGNOSIS — Z7189 Other specified counseling: Secondary | ICD-10-CM

## 2021-03-24 DIAGNOSIS — I10 Essential (primary) hypertension: Secondary | ICD-10-CM | POA: Diagnosis not present

## 2021-03-24 LAB — CBC WITH DIFFERENTIAL/PLATELET
Abs Immature Granulocytes: 0.04 10*3/uL (ref 0.00–0.07)
Basophils Absolute: 0 10*3/uL (ref 0.0–0.1)
Basophils Relative: 1 %
Eosinophils Absolute: 0.2 10*3/uL (ref 0.0–0.5)
Eosinophils Relative: 5 %
HCT: 32.4 % — ABNORMAL LOW (ref 36.0–46.0)
Hemoglobin: 11.1 g/dL — ABNORMAL LOW (ref 12.0–15.0)
Immature Granulocytes: 1 %
Lymphocytes Relative: 14 %
Lymphs Abs: 0.5 10*3/uL — ABNORMAL LOW (ref 0.7–4.0)
MCH: 32.8 pg (ref 26.0–34.0)
MCHC: 34.3 g/dL (ref 30.0–36.0)
MCV: 95.9 fL (ref 80.0–100.0)
Monocytes Absolute: 0.2 10*3/uL (ref 0.1–1.0)
Monocytes Relative: 6 %
Neutro Abs: 2.7 10*3/uL (ref 1.7–7.7)
Neutrophils Relative %: 73 %
Platelets: 127 10*3/uL — ABNORMAL LOW (ref 150–400)
RBC: 3.38 MIL/uL — ABNORMAL LOW (ref 3.87–5.11)
RDW: 17.3 % — ABNORMAL HIGH (ref 11.5–15.5)
WBC: 3.7 10*3/uL — ABNORMAL LOW (ref 4.0–10.5)
nRBC: 0 % (ref 0.0–0.2)

## 2021-03-24 LAB — CMP (CANCER CENTER ONLY)
ALT: 23 U/L (ref 0–44)
AST: 17 U/L (ref 15–41)
Albumin: 3.2 g/dL — ABNORMAL LOW (ref 3.5–5.0)
Alkaline Phosphatase: 79 U/L (ref 38–126)
Anion gap: 10 (ref 5–15)
BUN: 12 mg/dL (ref 8–23)
CO2: 20 mmol/L — ABNORMAL LOW (ref 22–32)
Calcium: 9.2 mg/dL (ref 8.9–10.3)
Chloride: 109 mmol/L (ref 98–111)
Creatinine: 0.77 mg/dL (ref 0.44–1.00)
GFR, Estimated: >60 mL/min (ref >60)
Glucose, Bld: 123 mg/dL — ABNORMAL HIGH (ref 70–99)
Potassium: 4 mmol/L (ref 3.5–5.1)
Sodium: 139 mmol/L (ref 135–145)
Total Bilirubin: 0.3 mg/dL (ref 0.3–1.2)
Total Protein: 6 g/dL — ABNORMAL LOW (ref 6.5–8.1)

## 2021-03-24 MED ORDER — SODIUM CHLORIDE 0.9 % IV SOLN
16.0000 mg/kg | Freq: Once | INTRAVENOUS | Status: AC
  Administered 2021-03-24: 1100 mg via INTRAVENOUS
  Filled 2021-03-24: qty 40

## 2021-03-24 MED ORDER — DIPHENHYDRAMINE HCL 25 MG PO CAPS
50.0000 mg | ORAL_CAPSULE | Freq: Once | ORAL | Status: AC
  Administered 2021-03-24: 50 mg via ORAL
  Filled 2021-03-24: qty 2

## 2021-03-24 MED ORDER — SODIUM CHLORIDE 0.9 % IV SOLN: Freq: Once | INTRAVENOUS | Status: AC

## 2021-03-24 MED ORDER — ACETAMINOPHEN 325 MG PO TABS
650.0000 mg | ORAL_TABLET | Freq: Once | ORAL | Status: AC
  Administered 2021-03-24: 650 mg via ORAL
  Filled 2021-03-24: qty 2

## 2021-03-24 MED ORDER — DEXAMETHASONE SODIUM PHOSPHATE 100 MG/10ML IJ SOLN
20.0000 mg | Freq: Once | INTRAMUSCULAR | Status: AC
  Administered 2021-03-24: 20 mg via INTRAVENOUS
  Filled 2021-03-24: qty 20

## 2021-03-24 MED ORDER — FAMOTIDINE 20 MG IN NS 100 ML IVPB
20.0000 mg | Freq: Once | INTRAVENOUS | Status: AC
Start: 2021-03-24 — End: 2021-03-24
  Administered 2021-03-24: 20 mg via INTRAVENOUS
  Filled 2021-03-24: qty 100

## 2021-03-24 NOTE — Patient Instructions (Signed)
[facility]  Discharge Instructions: Thank you for choosing [facility] to provide your oncology and hematology care.   If you have a lab appointment with the [facility], please go directly to the [facility] and check in at the registration area.   Wear comfortable clothing and clothing appropriate for easy access to any Portacath or PICC line.   We strive to give you quality time with your provider. You may need to reschedule your appointment if you arrive late (15 or more minutes).  Arriving late affects you and other patients whose appointments are after yours.  Also, if you miss three or more appointments without notifying the office, you may be dismissed from the clinic at the provider's discretion.      For prescription refill requests, have your pharmacy contact our office and allow 72 hours for refills to be completed.    Today you received the following chemotherapy and/or immunotherapy agent: Daratumumab (Darlex).   To help prevent nausea and vomiting after your treatment, we encourage you to take your nausea medication as directed.  BELOW ARE SYMPTOMS THAT SHOULD BE REPORTED IMMEDIATELY: *FEVER GREATER THAN 100.4 F (38 C) OR HIGHER *CHILLS OR SWEATING *NAUSEA AND VOMITING THAT IS NOT CONTROLLED WITH YOUR NAUSEA MEDICATION *UNUSUAL SHORTNESS OF BREATH *UNUSUAL BRUISING OR BLEEDING *URINARY PROBLEMS (pain or burning when urinating, or frequent urination) *BOWEL PROBLEMS (unusual diarrhea, constipation, pain near the anus) TENDERNESS IN MOUTH AND THROAT WITH OR WITHOUT PRESENCE OF ULCERS (sore throat, sores in mouth, or a toothache) UNUSUAL RASH, SWELLING OR PAIN  UNUSUAL VAGINAL DISCHARGE OR ITCHING   Items with * indicate a potential emergency and should be followed up as soon as possible or go to the Emergency Department if any problems should occur.  Please show the CHEMOTHERAPY ALERT CARD or IMMUNOTHERAPY ALERT CARD at  check-in to the Emergency Department and triage nurse.  Should you have questions after your visit or need to cancel or reschedule your appointment, please contact [facility]  Dept: [phone]  and follow the prompts.  Office hours are 8:00 a.m. to 4:30 p.m. Monday - Friday. Please note that voicemails left after 4:00 p.m. may not be returned until the following business day.  We are closed weekends and major holidays. You have access to a nurse at all times for urgent questions. Please call the main number to the clinic Dept: [phone] and follow the prompts.   For any non-urgent questions, you may also contact your provider using MyChart. We now offer e-Visits for anyone 50 and older to request care online for non-urgent symptoms. For details visit [email or web address].   Also download the MyChart app! Go to the app store, search "MyChart", open the app, select [facility], and log in with your MyChart username and password.  Due to Covid, a mask is required upon entering the hospital/clinic. If you do not have a mask, one will be given to you upon arrival. For doctor visits, patients may have 1 support person aged 82 or older with them. For treatment visits, patients cannot have anyone with them due to current Covid guidelines and our immunocompromised population.

## 2021-03-28 MED FILL — Dexamethasone Sodium Phosphate Inj 100 MG/10ML: INTRAMUSCULAR | Qty: 2 | Status: AC

## 2021-03-31 ENCOUNTER — Other Ambulatory Visit: Payer: Self-pay | Admitting: Hematology

## 2021-03-31 ENCOUNTER — Telehealth: Payer: Self-pay | Admitting: Hematology

## 2021-03-31 ENCOUNTER — Inpatient Hospital Stay: Payer: Medicare HMO | Attending: Hematology

## 2021-03-31 ENCOUNTER — Inpatient Hospital Stay: Payer: Medicare HMO

## 2021-03-31 ENCOUNTER — Other Ambulatory Visit: Payer: Self-pay

## 2021-03-31 VITALS — BP 128/71 | HR 78 | Temp 97.9°F | Resp 17 | Wt 152.2 lb

## 2021-03-31 DIAGNOSIS — Z5112 Encounter for antineoplastic immunotherapy: Secondary | ICD-10-CM | POA: Insufficient documentation

## 2021-03-31 DIAGNOSIS — C9000 Multiple myeloma not having achieved remission: Secondary | ICD-10-CM

## 2021-03-31 DIAGNOSIS — Z7189 Other specified counseling: Secondary | ICD-10-CM

## 2021-03-31 LAB — CBC WITH DIFFERENTIAL/PLATELET
Abs Immature Granulocytes: 0.02 10*3/uL (ref 0.00–0.07)
Basophils Absolute: 0 10*3/uL (ref 0.0–0.1)
Basophils Relative: 1 %
Eosinophils Absolute: 0.2 10*3/uL (ref 0.0–0.5)
Eosinophils Relative: 8 %
HCT: 32.8 % — ABNORMAL LOW (ref 36.0–46.0)
Hemoglobin: 11.2 g/dL — ABNORMAL LOW (ref 12.0–15.0)
Immature Granulocytes: 1 %
Lymphocytes Relative: 22 %
Lymphs Abs: 0.6 10*3/uL — ABNORMAL LOW (ref 0.7–4.0)
MCH: 33 pg (ref 26.0–34.0)
MCHC: 34.1 g/dL (ref 30.0–36.0)
MCV: 96.8 fL (ref 80.0–100.0)
Monocytes Absolute: 0.1 10*3/uL (ref 0.1–1.0)
Monocytes Relative: 5 %
Neutro Abs: 1.7 10*3/uL (ref 1.7–7.7)
Neutrophils Relative %: 63 %
Platelets: 115 10*3/uL — ABNORMAL LOW (ref 150–400)
RBC: 3.39 MIL/uL — ABNORMAL LOW (ref 3.87–5.11)
RDW: 17 % — ABNORMAL HIGH (ref 11.5–15.5)
WBC: 2.7 10*3/uL — ABNORMAL LOW (ref 4.0–10.5)
nRBC: 0 % (ref 0.0–0.2)

## 2021-03-31 LAB — CMP (CANCER CENTER ONLY)
ALT: 25 U/L (ref 0–44)
AST: 19 U/L (ref 15–41)
Albumin: 3 g/dL — ABNORMAL LOW (ref 3.5–5.0)
Alkaline Phosphatase: 86 U/L (ref 38–126)
Anion gap: 10 (ref 5–15)
BUN: 10 mg/dL (ref 8–23)
CO2: 21 mmol/L — ABNORMAL LOW (ref 22–32)
Calcium: 9 mg/dL (ref 8.9–10.3)
Chloride: 108 mmol/L (ref 98–111)
Creatinine: 0.78 mg/dL (ref 0.44–1.00)
GFR, Estimated: >60 mL/min
Glucose, Bld: 167 mg/dL — ABNORMAL HIGH (ref 70–99)
Potassium: 3.6 mmol/L (ref 3.5–5.1)
Sodium: 139 mmol/L (ref 135–145)
Total Bilirubin: 0.4 mg/dL (ref 0.3–1.2)
Total Protein: 5.7 g/dL — ABNORMAL LOW (ref 6.5–8.1)

## 2021-03-31 MED ORDER — FAMOTIDINE 20 MG IN NS 100 ML IVPB
20.0000 mg | Freq: Once | INTRAVENOUS | Status: AC
Start: 2021-03-31 — End: 2021-03-31
  Administered 2021-03-31: 20 mg via INTRAVENOUS
  Filled 2021-03-31: qty 100

## 2021-03-31 MED ORDER — ACETAMINOPHEN 325 MG PO TABS
650.0000 mg | ORAL_TABLET | Freq: Once | ORAL | Status: AC
  Administered 2021-03-31: 650 mg via ORAL
  Filled 2021-03-31: qty 2

## 2021-03-31 MED ORDER — SODIUM CHLORIDE 0.9 % IV SOLN
16.0000 mg/kg | Freq: Once | INTRAVENOUS | Status: AC
  Administered 2021-03-31: 1100 mg via INTRAVENOUS
  Filled 2021-03-31: qty 40

## 2021-03-31 MED ORDER — SODIUM CHLORIDE 0.9 % IV SOLN
20.0000 mg | Freq: Once | INTRAVENOUS | Status: AC
  Administered 2021-03-31: 20 mg via INTRAVENOUS
  Filled 2021-03-31: qty 20

## 2021-03-31 MED ORDER — SODIUM CHLORIDE 0.9 % IV SOLN: Freq: Once | INTRAVENOUS | Status: AC

## 2021-03-31 MED ORDER — DIPHENHYDRAMINE HCL 25 MG PO CAPS
50.0000 mg | ORAL_CAPSULE | Freq: Once | ORAL | Status: AC
  Administered 2021-03-31: 50 mg via ORAL
  Filled 2021-03-31: qty 2

## 2021-03-31 NOTE — Progress Notes (Addendum)
Ok to proceed with Daratumumab treatment today.   MD plans to move 04/07/21 appts and infusion to 04/14/21 and transition to Q 2 week treatments.  Raul Del [place], [place], BCPS, BCOP 03/31/2021 9:08 AM

## 2021-03-31 NOTE — Patient Instructions (Signed)
[facility]  Discharge Instructions: °Thank you for choosing [facility] to provide your oncology and hematology care.  ° °If you have a lab appointment with the Cancer Center, please go directly to the Cancer Center and check in at the registration area. °  °Wear comfortable clothing and clothing appropriate for easy access to any Portacath or PICC line.  ° °We strive to give you quality time with your provider. You may need to reschedule your appointment if you arrive late (15 or more minutes).  Arriving late affects you and other patients whose appointments are after yours.  Also, if you miss three or more appointments without notifying the office, you may be dismissed from the clinic at the provider’s discretion.    °  °For prescription refill requests, have your pharmacy contact our office and allow 72 hours for refills to be completed.   ° °Today you received the following chemotherapy and/or immunotherapy agents: Darzalex  °  °To help prevent nausea and vomiting after your treatment, we encourage you to take your nausea medication as directed. ° °BELOW ARE SYMPTOMS THAT SHOULD BE REPORTED IMMEDIATELY: °*FEVER GREATER THAN 100.4 F (38 °C) OR HIGHER °*CHILLS OR SWEATING °*NAUSEA AND VOMITING THAT IS NOT CONTROLLED WITH YOUR NAUSEA MEDICATION °*UNUSUAL SHORTNESS OF BREATH °*UNUSUAL BRUISING OR BLEEDING °*URINARY PROBLEMS (pain or burning when urinating, or frequent urination) °*BOWEL PROBLEMS (unusual diarrhea, constipation, pain near the anus) °TENDERNESS IN MOUTH AND THROAT WITH OR WITHOUT PRESENCE OF ULCERS (sore throat, sores in mouth, or a toothache) °UNUSUAL RASH, SWELLING OR PAIN  °UNUSUAL VAGINAL DISCHARGE OR ITCHING  ° °Items with * indicate a potential emergency and should be followed up as soon as possible or go to the Emergency Department if any problems should occur. ° °Please show the CHEMOTHERAPY ALERT CARD or IMMUNOTHERAPY ALERT CARD at check-in to the  Emergency Department and triage nurse. ° °Should you have questions after your visit or need to cancel or reschedule your appointment, please contact [facility]  Dept: [phone]  and follow the prompts.  Office hours are 8:00 a.m. to 4:30 p.m. Monday - Friday. Please note that voicemails left after 4:00 p.m. may not be returned until the following business day.  We are closed weekends and major holidays. You have access to a nurse at all times for urgent questions. Please call the main number to the clinic Dept: [phone] and follow the prompts. ° ° °For any non-urgent questions, you may also contact your provider using MyChart. We now offer e-Visits for anyone 18 and older to request care online for non-urgent symptoms. For details visit [email or web address]. °  °Also download the MyChart app! Go to the app store, search "MyChart", open the app, select [place], and log in with your MyChart username and password. ° °Due to Covid, a mask is required upon entering the hospital/clinic. If you do not have a mask, one will be given to you upon arrival. For doctor visits, patients may have 1 support person aged 18 or older with them. For treatment visits, patients cannot have anyone with them due to current Covid guidelines and our immunocompromised population.  ° °

## 2021-03-31 NOTE — Telephone Encounter (Signed)
Scheduled per sch msg. Called and left msg  

## 2021-04-07 ENCOUNTER — Other Ambulatory Visit: Payer: Self-pay

## 2021-04-07 ENCOUNTER — Ambulatory Visit: Payer: Medicare HMO

## 2021-04-07 ENCOUNTER — Ambulatory Visit: Payer: Medicare HMO | Admitting: Hematology

## 2021-04-07 ENCOUNTER — Other Ambulatory Visit: Payer: Medicare HMO

## 2021-04-07 ENCOUNTER — Other Ambulatory Visit: Payer: Self-pay | Admitting: Hematology

## 2021-04-07 DIAGNOSIS — C9000 Multiple myeloma not having achieved remission: Secondary | ICD-10-CM

## 2021-04-07 MED ORDER — LENALIDOMIDE 25 MG PO CAPS
25.0000 mg | ORAL_CAPSULE | Freq: Every day | ORAL | 3 refills | Status: DC
Start: 2021-04-07 — End: 2021-04-29

## 2021-04-08 ENCOUNTER — Encounter: Payer: Self-pay | Admitting: Hematology

## 2021-04-08 MED ORDER — HYDROCODONE-ACETAMINOPHEN 5-325 MG PO TABS: 1.0000 | ORAL_TABLET | Freq: Four times a day (QID) | ORAL | 0 refills | Status: DC | PRN

## 2021-04-11 MED FILL — Dexamethasone Sodium Phosphate Inj 100 MG/10ML: INTRAMUSCULAR | Qty: 2 | Status: AC

## 2021-04-14 ENCOUNTER — Inpatient Hospital Stay: Payer: Medicare HMO

## 2021-04-14 ENCOUNTER — Other Ambulatory Visit: Payer: Self-pay

## 2021-04-14 ENCOUNTER — Inpatient Hospital Stay (HOSPITAL_BASED_OUTPATIENT_CLINIC_OR_DEPARTMENT_OTHER): Payer: Medicare HMO | Admitting: Hematology

## 2021-04-14 VITALS — BP 123/81 | HR 89 | Temp 98.5°F | Resp 17 | Ht 64.0 in | Wt 152.5 lb

## 2021-04-14 VITALS — BP 126/62 | HR 83 | Temp 98.6°F | Resp 16

## 2021-04-14 DIAGNOSIS — Z5112 Encounter for antineoplastic immunotherapy: Secondary | ICD-10-CM | POA: Diagnosis not present

## 2021-04-14 DIAGNOSIS — C9000 Multiple myeloma not having achieved remission: Secondary | ICD-10-CM

## 2021-04-14 DIAGNOSIS — Z7189 Other specified counseling: Secondary | ICD-10-CM

## 2021-04-14 DIAGNOSIS — Z5111 Encounter for antineoplastic chemotherapy: Secondary | ICD-10-CM

## 2021-04-14 LAB — CMP (CANCER CENTER ONLY)
ALT: 23 U/L (ref 0–44)
AST: 21 U/L (ref 15–41)
Albumin: 3.3 g/dL — ABNORMAL LOW (ref 3.5–5.0)
Alkaline Phosphatase: 96 U/L (ref 38–126)
Anion gap: 11 (ref 5–15)
BUN: 12 mg/dL (ref 8–23)
CO2: 20 mmol/L — ABNORMAL LOW (ref 22–32)
Calcium: 9.3 mg/dL (ref 8.9–10.3)
Chloride: 109 mmol/L (ref 98–111)
Creatinine: 0.77 mg/dL (ref 0.44–1.00)
GFR, Estimated: >60 mL/min (ref >60)
Glucose, Bld: 117 mg/dL — ABNORMAL HIGH (ref 70–99)
Potassium: 4.1 mmol/L (ref 3.5–5.1)
Sodium: 140 mmol/L (ref 135–145)
Total Bilirubin: 0.4 mg/dL (ref 0.3–1.2)
Total Protein: 6.1 g/dL — ABNORMAL LOW (ref 6.5–8.1)

## 2021-04-14 LAB — CBC WITH DIFFERENTIAL/PLATELET
Abs Immature Granulocytes: 0.02 10*3/uL (ref 0.00–0.07)
Basophils Absolute: 0 10*3/uL (ref 0.0–0.1)
Basophils Relative: 1 %
Eosinophils Absolute: 0.1 10*3/uL (ref 0.0–0.5)
Eosinophils Relative: 4 %
HCT: 32.6 % — ABNORMAL LOW (ref 36.0–46.0)
Hemoglobin: 10.9 g/dL — ABNORMAL LOW (ref 12.0–15.0)
Immature Granulocytes: 1 %
Lymphocytes Relative: 22 %
Lymphs Abs: 0.8 10*3/uL (ref 0.7–4.0)
MCH: 32.2 pg (ref 26.0–34.0)
MCHC: 33.4 g/dL (ref 30.0–36.0)
MCV: 96.4 fL (ref 80.0–100.0)
Monocytes Absolute: 0.2 10*3/uL (ref 0.1–1.0)
Monocytes Relative: 4 %
Neutro Abs: 2.5 10*3/uL (ref 1.7–7.7)
Neutrophils Relative %: 68 %
Platelets: 181 10*3/uL (ref 150–400)
RBC: 3.38 MIL/uL — ABNORMAL LOW (ref 3.87–5.11)
RDW: 17.3 % — ABNORMAL HIGH (ref 11.5–15.5)
WBC: 3.6 10*3/uL — ABNORMAL LOW (ref 4.0–10.5)
nRBC: 0 % (ref 0.0–0.2)

## 2021-04-14 MED ORDER — SODIUM CHLORIDE 0.9 % IV SOLN
16.0000 mg/kg | Freq: Once | INTRAVENOUS | Status: AC
  Administered 2021-04-14: 1100 mg via INTRAVENOUS
  Filled 2021-04-14: qty 15

## 2021-04-14 MED ORDER — GABAPENTIN 300 MG PO CAPS
300.0000 mg | ORAL_CAPSULE | Freq: Three times a day (TID) | ORAL | 0 refills | Status: DC
Start: 2021-04-14 — End: 2021-05-02

## 2021-04-14 MED ORDER — DIPHENHYDRAMINE HCL 25 MG PO CAPS
50.0000 mg | ORAL_CAPSULE | Freq: Once | ORAL | Status: AC
  Administered 2021-04-14: 50 mg via ORAL
  Filled 2021-04-14: qty 2

## 2021-04-14 MED ORDER — FAMOTIDINE 20 MG IN NS 100 ML IVPB
20.0000 mg | Freq: Once | INTRAVENOUS | Status: AC
  Administered 2021-04-14: 20 mg via INTRAVENOUS
  Filled 2021-04-14: qty 100

## 2021-04-14 MED ORDER — ACETAMINOPHEN 325 MG PO TABS
650.0000 mg | ORAL_TABLET | Freq: Once | ORAL | Status: AC
  Administered 2021-04-14: 650 mg via ORAL
  Filled 2021-04-14: qty 2

## 2021-04-14 MED ORDER — DULOXETINE HCL 60 MG PO CPEP
60.0000 mg | ORAL_CAPSULE | Freq: Every day | ORAL | 2 refills | Status: DC
Start: 2021-04-14 — End: 2021-05-29

## 2021-04-14 MED ORDER — SODIUM CHLORIDE 0.9 % IV SOLN
20.0000 mg | Freq: Once | INTRAVENOUS | Status: AC
  Administered 2021-04-14: 20 mg via INTRAVENOUS
  Filled 2021-04-14: qty 20

## 2021-04-14 MED ORDER — SODIUM CHLORIDE 0.9 % IV SOLN: Freq: Once | INTRAVENOUS | Status: AC

## 2021-04-14 NOTE — Patient Instructions (Signed)
[facility]  Discharge Instructions: Thank you for choosing [facility] to provide your oncology and hematology care.   If you have a lab appointment with the Cancer Center, please go directly to the Cancer Center and check in at the registration area.   Wear comfortable clothing and clothing appropriate for easy access to any Portacath or PICC line.   We strive to give you quality time with your provider. You may need to reschedule your appointment if you arrive late (15 or more minutes).  Arriving late affects you and other patients whose appointments are after yours.  Also, if you miss three or more appointments without notifying the office, you may be dismissed from the clinic at the provider's discretion.      For prescription refill requests, have your pharmacy contact our office and allow 72 hours for refills to be completed.    Today you received the following chemotherapy and/or immunotherapy agents Darzalex      To help prevent nausea and vomiting after your treatment, we encourage you to take your nausea medication as directed.  BELOW ARE SYMPTOMS THAT SHOULD BE REPORTED IMMEDIATELY: *FEVER GREATER THAN 100.4 F (38 C) OR HIGHER *CHILLS OR SWEATING *NAUSEA AND VOMITING THAT IS NOT CONTROLLED WITH YOUR NAUSEA MEDICATION *UNUSUAL SHORTNESS OF BREATH *UNUSUAL BRUISING OR BLEEDING *URINARY PROBLEMS (pain or burning when urinating, or frequent urination) *BOWEL PROBLEMS (unusual diarrhea, constipation, pain near the anus) TENDERNESS IN MOUTH AND THROAT WITH OR WITHOUT PRESENCE OF ULCERS (sore throat, sores in mouth, or a toothache) UNUSUAL RASH, SWELLING OR PAIN  UNUSUAL VAGINAL DISCHARGE OR ITCHING   Items with * indicate a potential emergency and should be followed up as soon as possible or go to the Emergency Department if any problems should occur.  Please show the CHEMOTHERAPY ALERT CARD or IMMUNOTHERAPY ALERT CARD at check-in to  the Emergency Department and triage nurse.  Should you have questions after your visit or need to cancel or reschedule your appointment, please contact [facility]  Dept: [phone]  and follow the prompts.  Office hours are 8:00 a.m. to 4:30 p.m. Monday - Friday. Please note that voicemails left after 4:00 p.m. may not be returned until the following business day.  We are closed weekends and major holidays. You have access to a nurse at all times for urgent questions. Please call the main number to the clinic Dept: [phone] and follow the prompts.   For any non-urgent questions, you may also contact your provider using MyChart. We now offer e-Visits for anyone 18 and older to request care online for non-urgent symptoms. For details visit [email or web address].   Also download the MyChart app! Go to the app store, search "MyChart", open the app, select , and log in with your MyChart username and password.  Due to Covid, a mask is required upon entering the hospital/clinic. If you do not have a mask, one will be given to you upon arrival. For doctor visits, patients may have 1 support person aged 18 or older with them. For treatment visits, patients cannot have anyone with them due to current Covid guidelines and our immunocompromised population.   Daratumumab injection What is this medication? DARATUMUMAB (dar a toom ue mab) is a monoclonal antibody. It is used to treat multiple myeloma. This medicine may be used for other purposes; ask your health care provider or pharmacist if you have questions. COMMON BRAND NAME(S): DARZALEX What should I   tell my care team before I take this medication? They need to know if you have any of these conditions: hereditary fructose intolerance infection (especially a virus infection such as chickenpox, herpes, or hepatitis B virus) lung or breathing disease (asthma, COPD) an unusual or allergic reaction to  daratumumab, sorbitol, other medicines, foods, dyes, or preservatives pregnant or trying to get pregnant breast-feeding How should I use this medication? This medicine is for infusion into a vein. It is given by a health care professional in a hospital or clinic setting. Talk to your pediatrician regarding the use of this medicine in children. Special care may be needed. Overdosage: If you think you have taken too much of this medicine contact a poison control center or emergency room at once. NOTE: This medicine is only for you. Do not share this medicine with others. What if I miss a dose? Keep appointments for follow-up doses as directed. It is important not to miss your dose. Call your doctor or health care professional if you are unable to keep an appointment. What may interact with this medication? Interactions have not been studied. This list may not describe all possible interactions. Give your health care provider a list of all the medicines, herbs, non-prescription drugs, or dietary supplements you use. Also tell them if you smoke, drink alcohol, or use illegal drugs. Some items may interact with your medicine. What should I watch for while using this medication? Your condition will be monitored carefully while you are receiving this medicine. This medicine can cause serious allergic reactions. To reduce your risk, your health care provider may give you other medicine to take before receiving this one. Be sure to follow the directions from your health care provider. This medicine can affect the results of blood tests to match your blood type. These changes can last for up to 6 months after the final dose. Your healthcare provider will do blood tests to match your blood type before you start treatment. Tell all of your healthcare providers that you are being treated with this medicine before receiving a blood transfusion. This medicine can affect the results of some tests used to determine  treatment response; extra tests may be needed to evaluate response. Do not become pregnant while taking this medicine or for 3 months after stopping it. Women should inform their health care provider if they wish to become pregnant or think they might be pregnant. There is a potential for serious side effects to an unborn child. Talk to your health care provider for more information. Do not breast-feed an infant while taking this medicine. What side effects may I notice from receiving this medication? Side effects that you should report to your doctor or health care professional as soon as possible: allergic reactions (skin rash, itching, hives, swelling of the face, lips, or tongue) blurred vision infection (fever, chills, cough, sore throat, pain or difficulty passing urine) infusion reaction (dizziness, fast heartbeat, feeling faint or lightheaded, falls, headache, increase in blood pressure, nausea, vomiting, or wheezing or trouble breathing with loud or whistling sounds) unusual bleeding or bruising Side effects that usually do not require medical attention (report to your doctor or health care professional if they continue or are bothersome): constipation diarrhea pain, tingling, numbness in the hands or feet swelling of the ankles, feet, hands tiredness This list may not describe all possible side effects. Call your doctor for medical advice about side effects. You may report side effects to FDA at 1-800-FDA-1088. Where should I keep   my medication? This drug is given in a hospital or clinic and will not be stored at home. NOTE: This sheet is a summary. It may not cover all possible information. If you have questions about this medicine, talk to your doctor, pharmacist, or health care provider.  2022 Elsevier/Gold Standard (2020-07-25 12:50:38)   

## 2021-04-20 ENCOUNTER — Encounter: Payer: Self-pay | Admitting: Hematology

## 2021-04-20 NOTE — Progress Notes (Signed)
[facility] progress notes  Patient Care Team: Harlan Stains, MD as PCP - General (Family Medicine) Brunetta Genera, MD as Consulting Physician (Hematology)  CHIEF COMPLAINTS/PURPOSE OF VISIT:  Multiple myeloma, ongoing treatment  HISTORY OF PRESENTING ILLNESS: Please see previous note  INTERVAL HISTORY  Tracy Fisher is a wonderful 75 y.o. female who is here for follow-up of her myeloma. She is here for C3D15 of Daratumumab.  The pt reports no specific toxicities from her daratumumab or her Revlimid . Mild muscle cramps . No significant diarrhea or skin rashes .  Lab results today 04/14/2021 of CBC w/diff and CMP reviewed  Myeloma panel from 03/17/2021 showed an M spike that is down to 0.2 g/dL On review of systems, pt reports no other acute new symptoms. She notes that she has been staying active and feels well and her neuropathy is resolving.    Oncology History  Smoldering multiple myeloma (SMM)  Multiple myeloma ([place])  08/28/2020 Initial Diagnosis   Multiple myeloma ([place])   09/09/2020 - 10/07/2020 Chemotherapy          01/20/2021 -  Chemotherapy   Patient is on Treatment Plan : MYELOMA NEWLY DIAGNOSED TRANSPLANT CANDIDATE DaraVRd (Daratumumab IV) q21d x 6 Cycles (Induction/Consolidation)       MEDICAL HISTORY:  Past Medical History:  Diagnosis Date   Bone cancer ([place]) 2018   started chemo pill in  2018   Diabetes mellitus without complication ([place])    Hypertension    Vocal cord cyst     SURGICAL HISTORY: No past surgical history on file.  SOCIAL HISTORY: Social History   Socioeconomic History   Marital status: Divorced    Spouse name: Not on file   Number of children: Not on file   Years of education: Not on file   Highest education level: Not on file  Occupational History   Not on file  Tobacco Use   Smoking status: Never   Smokeless tobacco: Never  Vaping Use   Vaping Use: Never used  Substance and Sexual  Activity   Alcohol use: No   Drug use: Never   Sexual activity: Not on file  Other Topics Concern   Not on file  Social History Narrative   Not on file   Social Determinants of Health   Financial Resource Strain: Not on file  Food Insecurity: Not on file  Transportation Needs: Not on file  Physical Activity: Not on file  Stress: Not on file  Social Connections: Not on file  Intimate Partner Violence: Not on file    FAMILY HISTORY: Family History  Problem Relation Age of Onset   Breast cancer Neg Hx     ALLERGIES:  is allergic to bortezomib and sulfa antibiotics.  MEDICATIONS:  Current Outpatient Medications  Medication Sig Dispense Refill   acyclovir (ZOVIRAX) 400 MG tablet Take 1 tablet (400 mg total) by mouth 2 (two) times daily. 60 tablet 3   amLODipine (NORVASC) 5 MG tablet      aspirin EC 81 MG tablet Take 81 mg by mouth daily. Swallow whole.     atorvastatin (LIPITOR) 20 MG tablet      b complex vitamins capsule Take 1 capsule by mouth daily. 30 capsule 11   brimonidine (ALPHAGAN) 0.2 % ophthalmic solution Place 1 drop into both eyes 2 (two) times daily.   3   Calcium Carbonate-Vitamin D (CALCIUM + D PO) Take 1 tablet by mouth daily.     dexamethasone (DECADRON)  4 MG tablet Take 5 tablets the day after daratumumab for cycle 5 & 6-- also take the day after day 8 Velcade and take daily for 2 days on non-chemo weeks (days 15 and 16). 20 tablet 5   dorzolamide-timolol (COSOPT) 22.3-6.8 MG/ML ophthalmic solution Place 1 drop into both eyes 2 (two) times daily.   12   DULoxetine (CYMBALTA) 60 MG capsule Take 1 capsule (60 mg total) by mouth daily. 30 capsule 2   gabapentin (NEURONTIN) 300 MG capsule Take 1 capsule (300 mg total) by mouth 3 (three) times daily. 90 capsule 0   HYDROcodone-acetaminophen (NORCO) 5-325 MG tablet Take 1 tablet by mouth every 6 (six) hours as needed for moderate pain. 60 tablet 0   lenalidomide (REVLIMID) 25 MG capsule Take 1 capsule (25 mg total)  by mouth daily. Take 21 days on, 7 days off, repeat every 28 days. 21 capsule 3   LUMIGAN 0.01 % SOLN INSTILL 1 DROP INTO EACH EYE ONCE DAILY     metoCLOPramide (REGLAN) 10 MG tablet Take 1 tablet (10 mg total) by mouth every 8 (eight) hours as needed for nausea or vomiting. (Patient not taking: No sig reported) 6 tablet 0   mirtazapine (REMERON) 15 MG tablet Take 15 mg by mouth at bedtime.     Multiple Vitamin (MULTIVITAMIN) capsule Take by mouth.     ondansetron (ZOFRAN) 8 MG tablet Take 1 tablet (8 mg total) by mouth 2 (two) times daily as needed (Nausea or vomiting). 30 tablet 1   ONETOUCH VERIO test strip USE STRIP TO CHECK GLUCOSE TWICE DAILY     potassium chloride SA (KLOR-CON) 20 MEQ tablet Take 1 tablet (20 mEq total) by mouth 2 (two) times daily. 60 tablet 0   No current facility-administered medications for this visit.    REVIEW OF SYSTEMS:   .10 Point review of Systems was done is negative except as noted above.  PHYSICAL EXAMINATION: ECOG PERFORMANCE STATUS: 1 - Symptomatic but completely ambulatory  Vitals:   04/14/21 0906  BP: 123/81  Pulse: 89  Resp: 17  Temp: 98.5 F (36.9 C)  SpO2: 98%   Filed Weights   04/14/21 0906  Weight: 152 lb 8 oz (69.2 kg)  . [name] GENERAL:alert, in no acute distress and comfortable SKIN: no acute rashes, no significant lesions EYES: conjunctiva are pink and non-injected, sclera anicteric OROPHARYNX: MMM, no exudates, no oropharyngeal erythema or ulceration NECK: supple, no JVD LYMPH:  no palpable lymphadenopathy in the cervical, axillary or inguinal regions LUNGS: clear to auscultation b/l with normal respiratory effort HEART: regular rate & rhythm ABDOMEN:  normoactive bowel sounds , non tender, not distended. Extremity: no pedal edema PSYCH: alert & oriented x 3 with fluent speech NEURO: no focal motor/sensory deficits    LABORATORY DATA:   . CBC Latest Ref Rng & Units 04/14/2021 03/31/2021 03/24/2021  WBC 4.0 - 10.5 K/uL  3.6(L) 2.7(L) 3.7(L)  Hemoglobin 12.0 - 15.0 g/dL 10.9(L) 11.2(L) 11.1(L)  Hematocrit 36.0 - 46.0 % 32.6(L) 32.8(L) 32.4(L)  Platelets 150 - 400 K/uL 181 115(L) 127(L)   . CMP Latest Ref Rng & Units 04/14/2021 03/31/2021 03/24/2021  Glucose 70 - 99 mg/dL 117(H) 167(H) 123(H)  BUN 8 - 23 mg/dL _0 Creatinine 0.44 - 1.00 mg/dL 0.77 0.78 0.77  Sodium 135 - 145 mmol/L 140 139 139  Potassium 3.5 - 5.1 mmol/L 4.1 3.6 4.0  Chloride 98 - 111 mmol/L 109 108 109  CO2 22 - 32 mmol/L 20(L)  21(L) 20(L)  Calcium 8.9 - 10.3 mg/dL 9.3 9.0 9.2  Total Protein 6.5 - 8.1 g/dL 6.1(L) 5.7(L) 6.0(L)  Total Bilirubin 0.3 - 1.2 mg/dL 0.4 0.4 0.3  Alkaline Phos 38 - 126 U/L 96 86 79  AST 15 - 41 U/L _0 ALT 0 - 44 U/L _1 RADIOGRAPHIC STUDIES: I have personally reviewed the radiological images as listed and agreed with the findings in the report. No results found.  ASSESSMENT & PLAN:   75 y.o. female with   #1 IgG kappa multiple myeloma.   Initially presented as smoldering myeloma and had M spike of 1.9 g/dL which was noted to be IgG kappa on immunofixation electrophoresis. -Bone survey shows no overt evidence of osseous involvement suggestive of multiple myeloma. Beta 2 microglobulin, sedimentation rate an LDH levels are within normal limits. -Bone marrow biopsy shows previously showed 17% monoclonal plasma cells Cytogenetics/MolCy - trisomy 45 (consistent with myeloma)   -Bone study from 09/27/17 showed some osteopenic changes without osteoporosis. Previous bone studies are not yet available  -09/08/2019 FISH Panel revealed "No Mutations Detected". -09/08/2019 Cytogenetics revealed "Normal female karyotype". -09/08/2019 BM Bx Report ([id number])  revealed "BONE MARROW, ASPIRATE, CLOT, CORE: -Hypercellular bone marrow for age with plasma cell neoplasm PERIPHERAL BLOOD: -Macrocytic anemia -Leukopenia." - 20% Plasma Cells  -09/15/2019 PET/CT ([id number]) revealed "1. No findings  of active myeloma or other hypermetabolic malignancy. 2.  Aortic Atherosclerosis (ICD10-I70.0). 3. Photopenic hypodense hepatic lesions most compatible with cysts."   Now with active Multiple myeloma Bone Marrow Biopsy with nearly 60% plasma cells. With M spike of 3.6g/dl Starting to develop mild anemia (does not meet anemia criteria yet) No hypercalcemia, renal insufficiency  PET/CT 07/31/2020 - No abnormal hypermetabolism in the neck, chest, abdomen or pelvis.   #2  Chronic leukopenia with intermittent neutropenia. -B12 and folate levels within normal limits. 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  -Reasonable to take a daily vitamin B complex tablet. -No indication for G-CSF at this time. -Now also additionally possibly due to Revlimid.   PLAN: -Discussed pt labwork today, 04/14/2021; labs reviewed-CBC shows a hemoglobin of 10.9 normal platelet count of 181k WBC count of 3.6k CMP stable Myeloma panel from 03/17/2021 shows M protein down to 0.2 g/dL -The pt has no prohibitive toxicities from continuing [place] at this time. Will continue to monitor. -will continue Dara with same supportive medications.  Continue same dose of Revlimid. -Anticipate 4-6 cycles of induction treatment prior to moving to maintenance Revlimid. -Continue Vitamin B Complex daily. -Continue Cymbalta.   FOLLOW UP: Please schedule next 2 cycles of daratumumab (4 doses every 2 weeks).  Port flush and labs with each treatment MD visit in 4 weeks   All questions were answered. The patient knows to call the clinic with any problems, questions or concerns.  . The total time spent in the appointment was 30 minutes and more than 50% was on counseling and direct patient cares, ordering and mx of chemotherapy   Sullivan Lone MD MS Hematology/Oncology Physician [facility]

## 2021-04-20 NOTE — Addendum Note (Signed)
Addended by: Sullivan Lone on: 04/20/2021 11:07 PM   Modules accepted: Orders

## 2021-04-28 ENCOUNTER — Inpatient Hospital Stay: Payer: Medicare HMO

## 2021-04-28 ENCOUNTER — Other Ambulatory Visit: Payer: Self-pay

## 2021-04-28 ENCOUNTER — Other Ambulatory Visit: Payer: Self-pay | Admitting: Hematology

## 2021-04-28 VITALS — BP 119/61 | HR 82 | Temp 98.4°F | Resp 18 | Wt 150.5 lb

## 2021-04-28 DIAGNOSIS — Z5112 Encounter for antineoplastic immunotherapy: Secondary | ICD-10-CM | POA: Diagnosis not present

## 2021-04-28 DIAGNOSIS — C9000 Multiple myeloma not having achieved remission: Secondary | ICD-10-CM

## 2021-04-28 DIAGNOSIS — Z7189 Other specified counseling: Secondary | ICD-10-CM

## 2021-04-28 LAB — CBC WITH DIFFERENTIAL/PLATELET
Abs Immature Granulocytes: 0.01 10*3/uL (ref 0.00–0.07)
Basophils Absolute: 0 10*3/uL (ref 0.0–0.1)
Basophils Relative: 2 %
Eosinophils Absolute: 0.4 10*3/uL (ref 0.0–0.5)
Eosinophils Relative: 15 %
HCT: 33.3 % — ABNORMAL LOW (ref 36.0–46.0)
Hemoglobin: 11.3 g/dL — ABNORMAL LOW (ref 12.0–15.0)
Immature Granulocytes: 0 %
Lymphocytes Relative: 29 %
Lymphs Abs: 0.7 10*3/uL (ref 0.7–4.0)
MCH: 32.7 pg (ref 26.0–34.0)
MCHC: 33.9 g/dL (ref 30.0–36.0)
MCV: 96.2 fL (ref 80.0–100.0)
Monocytes Absolute: 0.2 10*3/uL (ref 0.1–1.0)
Monocytes Relative: 8 %
Neutro Abs: 1.1 10*3/uL — ABNORMAL LOW (ref 1.7–7.7)
Neutrophils Relative %: 46 %
Platelets: 236 10*3/uL (ref 150–400)
RBC: 3.46 MIL/uL — ABNORMAL LOW (ref 3.87–5.11)
RDW: 16.8 % — ABNORMAL HIGH (ref 11.5–15.5)
WBC: 2.4 10*3/uL — ABNORMAL LOW (ref 4.0–10.5)
nRBC: 0 % (ref 0.0–0.2)

## 2021-04-28 LAB — CMP (CANCER CENTER ONLY)
ALT: 23 U/L (ref 0–44)
AST: 21 U/L (ref 15–41)
Albumin: 3.2 g/dL — ABNORMAL LOW (ref 3.5–5.0)
Alkaline Phosphatase: 102 U/L (ref 38–126)
Anion gap: 9 (ref 5–15)
BUN: 11 mg/dL (ref 8–23)
CO2: 21 mmol/L — ABNORMAL LOW (ref 22–32)
Calcium: 9.3 mg/dL (ref 8.9–10.3)
Chloride: 108 mmol/L (ref 98–111)
Creatinine: 0.92 mg/dL (ref 0.44–1.00)
GFR, Estimated: >60 mL/min (ref >60)
Glucose, Bld: 99 mg/dL (ref 70–99)
Potassium: 4.1 mmol/L (ref 3.5–5.1)
Sodium: 138 mmol/L (ref 135–145)
Total Bilirubin: 0.4 mg/dL (ref 0.3–1.2)
Total Protein: 6.4 g/dL — ABNORMAL LOW (ref 6.5–8.1)

## 2021-04-28 MED ORDER — SODIUM CHLORIDE 0.9 % IV SOLN
20.0000 mg | Freq: Once | INTRAVENOUS | Status: AC
  Administered 2021-04-28: 20 mg via INTRAVENOUS
  Filled 2021-04-28: qty 20

## 2021-04-28 MED ORDER — FAMOTIDINE 20 MG IN NS 100 ML IVPB
20.0000 mg | Freq: Once | INTRAVENOUS | Status: AC
  Administered 2021-04-28: 20 mg via INTRAVENOUS
  Filled 2021-04-28: qty 100

## 2021-04-28 MED ORDER — ACETAMINOPHEN 325 MG PO TABS
650.0000 mg | ORAL_TABLET | Freq: Once | ORAL | Status: AC
  Administered 2021-04-28: 650 mg via ORAL
  Filled 2021-04-28: qty 2

## 2021-04-28 MED ORDER — DARATUMUMAB CHEMO INJECTION 400 MG/20ML
16.0000 mg/kg | Freq: Once | INTRAVENOUS | Status: AC
Start: 2021-04-28 — End: 2021-04-28
  Administered 2021-04-28: 1100 mg via INTRAVENOUS
  Filled 2021-04-28: qty 40

## 2021-04-28 MED ORDER — SODIUM CHLORIDE 0.9 % IV SOLN: Freq: Once | INTRAVENOUS | Status: AC

## 2021-04-28 MED ORDER — DIPHENHYDRAMINE HCL 25 MG PO CAPS
50.0000 mg | ORAL_CAPSULE | Freq: Once | ORAL | Status: AC
  Administered 2021-04-28: 50 mg via ORAL
  Filled 2021-04-28: qty 2

## 2021-04-28 NOTE — Patient Instructions (Signed)
[facility]  Discharge Instructions: °Thank you for choosing [facility] to provide your oncology and hematology care.  ° °If you have a lab appointment with the Cancer Center, please go directly to the Cancer Center and check in at the registration area. °  °Wear comfortable clothing and clothing appropriate for easy access to any Portacath or PICC line.  ° °We strive to give you quality time with your provider. You may need to reschedule your appointment if you arrive late (15 or more minutes).  Arriving late affects you and other patients whose appointments are after yours.  Also, if you miss three or more appointments without notifying the office, you may be dismissed from the clinic at the provider’s discretion.    °  °For prescription refill requests, have your pharmacy contact our office and allow 72 hours for refills to be completed.   ° °Today you received the following chemotherapy and/or immunotherapy agents: Darzalex  °  °To help prevent nausea and vomiting after your treatment, we encourage you to take your nausea medication as directed. ° °BELOW ARE SYMPTOMS THAT SHOULD BE REPORTED IMMEDIATELY: °*FEVER GREATER THAN 100.4 F (38 °C) OR HIGHER °*CHILLS OR SWEATING °*NAUSEA AND VOMITING THAT IS NOT CONTROLLED WITH YOUR NAUSEA MEDICATION °*UNUSUAL SHORTNESS OF BREATH °*UNUSUAL BRUISING OR BLEEDING °*URINARY PROBLEMS (pain or burning when urinating, or frequent urination) °*BOWEL PROBLEMS (unusual diarrhea, constipation, pain near the anus) °TENDERNESS IN MOUTH AND THROAT WITH OR WITHOUT PRESENCE OF ULCERS (sore throat, sores in mouth, or a toothache) °UNUSUAL RASH, SWELLING OR PAIN  °UNUSUAL VAGINAL DISCHARGE OR ITCHING  ° °Items with * indicate a potential emergency and should be followed up as soon as possible or go to the Emergency Department if any problems should occur. ° °Please show the CHEMOTHERAPY ALERT CARD or IMMUNOTHERAPY ALERT CARD at check-in to the  Emergency Department and triage nurse. ° °Should you have questions after your visit or need to cancel or reschedule your appointment, please contact [facility]  Dept: [phone]  and follow the prompts.  Office hours are 8:00 a.m. to 4:30 p.m. Monday - Friday. Please note that voicemails left after 4:00 p.m. may not be returned until the following business day.  We are closed weekends and major holidays. You have access to a nurse at all times for urgent questions. Please call the main number to the clinic Dept: [phone] and follow the prompts. ° ° °For any non-urgent questions, you may also contact your provider using MyChart. We now offer e-Visits for anyone 18 and older to request care online for non-urgent symptoms. For details visit [email or web address]. °  °Also download the MyChart app! Go to the app store, search "MyChart", open the app, select [place], and log in with your MyChart username and password. ° °Due to Covid, a mask is required upon entering the hospital/clinic. If you do not have a mask, one will be given to you upon arrival. For doctor visits, patients may have 1 support person aged 18 or older with them. For treatment visits, patients cannot have anyone with them due to current Covid guidelines and our immunocompromised population.  ° °

## 2021-04-28 NOTE — Progress Notes (Signed)
ANC 1.1 Dr Irene Limbo aware. Per Dr Irene Limbo pt ok to treat.

## 2021-04-29 ENCOUNTER — Other Ambulatory Visit: Payer: Self-pay | Admitting: *Deleted

## 2021-04-29 DIAGNOSIS — C9000 Multiple myeloma not having achieved remission: Secondary | ICD-10-CM

## 2021-04-29 LAB — KAPPA/LAMBDA LIGHT CHAINS
Kappa free light chain: 11 mg/L (ref 3.3–19.4)
Kappa, lambda light chain ratio: 1.57 (ref 0.26–1.65)
Lambda free light chains: 7 mg/L (ref 5.7–26.3)

## 2021-04-29 MED ORDER — LENALIDOMIDE 25 MG PO CAPS: 25.0000 mg | ORAL_CAPSULE | Freq: Every day | ORAL | 3 refills | Status: DC

## 2021-04-30 ENCOUNTER — Other Ambulatory Visit: Payer: Self-pay

## 2021-04-30 DIAGNOSIS — C9000 Multiple myeloma not having achieved remission: Secondary | ICD-10-CM

## 2021-04-30 DIAGNOSIS — H401123 Primary open-angle glaucoma, left eye, severe stage: Secondary | ICD-10-CM | POA: Diagnosis not present

## 2021-04-30 DIAGNOSIS — Z961 Presence of intraocular lens: Secondary | ICD-10-CM | POA: Diagnosis not present

## 2021-04-30 DIAGNOSIS — H401111 Primary open-angle glaucoma, right eye, mild stage: Secondary | ICD-10-CM | POA: Diagnosis not present

## 2021-04-30 MED ORDER — LENALIDOMIDE 25 MG PO CAPS
25.0000 mg | ORAL_CAPSULE | Freq: Every day | ORAL | 3 refills | Status: DC
Start: 2021-04-30 — End: 2021-05-20

## 2021-05-01 LAB — MULTIPLE MYELOMA PANEL, SERUM
Albumin SerPl Elph-Mcnc: 3.2 g/dL (ref 2.9–4.4)
Albumin/Glob SerPl: 1.3 (ref 0.7–1.7)
Alpha 1: 0.3 g/dL (ref 0.0–0.4)
Alpha2 Glob SerPl Elph-Mcnc: 1.1 g/dL — ABNORMAL HIGH (ref 0.4–1.0)
B-Globulin SerPl Elph-Mcnc: 0.9 g/dL (ref 0.7–1.3)
Gamma Glob SerPl Elph-Mcnc: 0.3 g/dL — ABNORMAL LOW (ref 0.4–1.8)
Globulin, Total: 2.6 g/dL (ref 2.2–3.9)
IgA: 21 mg/dL — ABNORMAL LOW (ref 64–422)
IgG (Immunoglobin G), Serum: 413 mg/dL — ABNORMAL LOW (ref 586–1602)
IgM (Immunoglobulin M), Srm: 19 mg/dL — ABNORMAL LOW (ref 26–217)
M Protein SerPl Elph-Mcnc: 0.1 g/dL — ABNORMAL HIGH
Total Protein ELP: 5.8 g/dL — ABNORMAL LOW (ref 6.0–8.5)

## 2021-05-02 ENCOUNTER — Other Ambulatory Visit: Payer: Self-pay | Admitting: Hematology

## 2021-05-02 ENCOUNTER — Other Ambulatory Visit: Payer: Self-pay

## 2021-05-02 DIAGNOSIS — C9000 Multiple myeloma not having achieved remission: Secondary | ICD-10-CM

## 2021-05-02 MED ORDER — GABAPENTIN 300 MG PO CAPS: 300.0000 mg | ORAL_CAPSULE | Freq: Three times a day (TID) | ORAL | 0 refills | Status: DC

## 2021-05-02 MED ORDER — POTASSIUM CHLORIDE CRYS ER 20 MEQ PO TBCR
20.0000 meq | EXTENDED_RELEASE_TABLET | Freq: Two times a day (BID) | ORAL | 0 refills | Status: DC
Start: 2021-05-02 — End: 2021-05-02

## 2021-05-08 ENCOUNTER — Other Ambulatory Visit: Payer: Self-pay

## 2021-05-08 DIAGNOSIS — I10 Essential (primary) hypertension: Secondary | ICD-10-CM | POA: Diagnosis not present

## 2021-05-08 DIAGNOSIS — C9000 Multiple myeloma not having achieved remission: Secondary | ICD-10-CM

## 2021-05-08 DIAGNOSIS — E1169 Type 2 diabetes mellitus with other specified complication: Secondary | ICD-10-CM | POA: Diagnosis not present

## 2021-05-08 DIAGNOSIS — K219 Gastro-esophageal reflux disease without esophagitis: Secondary | ICD-10-CM | POA: Diagnosis not present

## 2021-05-08 DIAGNOSIS — E785 Hyperlipidemia, unspecified: Secondary | ICD-10-CM | POA: Diagnosis not present

## 2021-05-08 DIAGNOSIS — R69 Illness, unspecified: Secondary | ICD-10-CM | POA: Diagnosis not present

## 2021-05-08 MED ORDER — HYDROCODONE-ACETAMINOPHEN 5-325 MG PO TABS: 1.0000 | ORAL_TABLET | Freq: Four times a day (QID) | ORAL | 0 refills | Status: DC | PRN

## 2021-05-09 MED FILL — Dexamethasone Sodium Phosphate Inj 100 MG/10ML: INTRAMUSCULAR | Qty: 2 | Status: AC

## 2021-05-12 ENCOUNTER — Inpatient Hospital Stay: Payer: Medicare HMO | Attending: Hematology

## 2021-05-12 ENCOUNTER — Other Ambulatory Visit: Payer: Self-pay

## 2021-05-12 ENCOUNTER — Inpatient Hospital Stay (HOSPITAL_BASED_OUTPATIENT_CLINIC_OR_DEPARTMENT_OTHER): Payer: Medicare HMO | Admitting: Hematology

## 2021-05-12 ENCOUNTER — Other Ambulatory Visit: Payer: Medicare HMO

## 2021-05-12 ENCOUNTER — Inpatient Hospital Stay: Payer: Medicare HMO

## 2021-05-12 VITALS — BP 126/84 | HR 82 | Temp 97.8°F | Resp 20 | Wt 153.8 lb

## 2021-05-12 VITALS — BP 136/57 | HR 85 | Temp 98.0°F | Resp 16

## 2021-05-12 DIAGNOSIS — Z79899 Other long term (current) drug therapy: Secondary | ICD-10-CM | POA: Insufficient documentation

## 2021-05-12 DIAGNOSIS — Z5112 Encounter for antineoplastic immunotherapy: Secondary | ICD-10-CM | POA: Insufficient documentation

## 2021-05-12 DIAGNOSIS — C9000 Multiple myeloma not having achieved remission: Secondary | ICD-10-CM

## 2021-05-12 DIAGNOSIS — I10 Essential (primary) hypertension: Secondary | ICD-10-CM | POA: Insufficient documentation

## 2021-05-12 DIAGNOSIS — Z23 Encounter for immunization: Secondary | ICD-10-CM | POA: Diagnosis not present

## 2021-05-12 DIAGNOSIS — D709 Neutropenia, unspecified: Secondary | ICD-10-CM | POA: Insufficient documentation

## 2021-05-12 DIAGNOSIS — Z7982 Long term (current) use of aspirin: Secondary | ICD-10-CM | POA: Diagnosis not present

## 2021-05-12 DIAGNOSIS — Z5111 Encounter for antineoplastic chemotherapy: Secondary | ICD-10-CM

## 2021-05-12 DIAGNOSIS — Z7189 Other specified counseling: Secondary | ICD-10-CM

## 2021-05-12 LAB — CBC WITH DIFFERENTIAL/PLATELET
Abs Immature Granulocytes: 0.02 10*3/uL (ref 0.00–0.07)
Basophils Absolute: 0.1 10*3/uL (ref 0.0–0.1)
Basophils Relative: 2 %
Eosinophils Absolute: 0.3 10*3/uL (ref 0.0–0.5)
Eosinophils Relative: 9 %
HCT: 32.2 % — ABNORMAL LOW (ref 36.0–46.0)
Hemoglobin: 10.9 g/dL — ABNORMAL LOW (ref 12.0–15.0)
Immature Granulocytes: 1 %
Lymphocytes Relative: 24 %
Lymphs Abs: 0.9 10*3/uL (ref 0.7–4.0)
MCH: 32.8 pg (ref 26.0–34.0)
MCHC: 33.9 g/dL (ref 30.0–36.0)
MCV: 97 fL (ref 80.0–100.0)
Monocytes Absolute: 0.3 10*3/uL (ref 0.1–1.0)
Monocytes Relative: 7 %
Neutro Abs: 2.2 10*3/uL (ref 1.7–7.7)
Neutrophils Relative %: 57 %
Platelets: 165 10*3/uL (ref 150–400)
RBC: 3.32 MIL/uL — ABNORMAL LOW (ref 3.87–5.11)
RDW: 16.3 % — ABNORMAL HIGH (ref 11.5–15.5)
WBC: 3.8 10*3/uL — ABNORMAL LOW (ref 4.0–10.5)
nRBC: 0 % (ref 0.0–0.2)

## 2021-05-12 LAB — CMP (CANCER CENTER ONLY)
ALT: 17 U/L (ref 0–44)
AST: 17 U/L (ref 15–41)
Albumin: 3.5 g/dL (ref 3.5–5.0)
Alkaline Phosphatase: 85 U/L (ref 38–126)
Anion gap: 9 (ref 5–15)
BUN: 12 mg/dL (ref 8–23)
CO2: 20 mmol/L — ABNORMAL LOW (ref 22–32)
Calcium: 9.1 mg/dL (ref 8.9–10.3)
Chloride: 110 mmol/L (ref 98–111)
Creatinine: 0.73 mg/dL (ref 0.44–1.00)
GFR, Estimated: >60 mL/min (ref >60)
Glucose, Bld: 98 mg/dL (ref 70–99)
Potassium: 4 mmol/L (ref 3.5–5.1)
Sodium: 139 mmol/L (ref 135–145)
Total Bilirubin: 0.5 mg/dL (ref 0.3–1.2)
Total Protein: 6.2 g/dL — ABNORMAL LOW (ref 6.5–8.1)

## 2021-05-12 MED ORDER — ACETAMINOPHEN 325 MG PO TABS
650.0000 mg | ORAL_TABLET | Freq: Once | ORAL | Status: AC
  Administered 2021-05-12: 650 mg via ORAL
  Filled 2021-05-12: qty 2

## 2021-05-12 MED ORDER — INFLUENZA VAC A&B SA ADJ QUAD 0.5 ML IM PRSY
0.5000 mL | PREFILLED_SYRINGE | Freq: Once | INTRAMUSCULAR | Status: AC
  Administered 2021-05-12: 0.5 mL via INTRAMUSCULAR
  Filled 2021-05-12: qty 0.5

## 2021-05-12 MED ORDER — SODIUM CHLORIDE 0.9 % IV SOLN
Freq: Once | INTRAVENOUS | Status: AC
Start: 2021-05-12 — End: 2021-05-12

## 2021-05-12 MED ORDER — SODIUM CHLORIDE 0.9 % IV SOLN
16.0000 mg/kg | Freq: Once | INTRAVENOUS | Status: AC
  Administered 2021-05-12: 1100 mg via INTRAVENOUS
  Filled 2021-05-12: qty 15

## 2021-05-12 MED ORDER — DIPHENHYDRAMINE HCL 25 MG PO CAPS
50.0000 mg | ORAL_CAPSULE | Freq: Once | ORAL | Status: AC
  Administered 2021-05-12: 50 mg via ORAL
  Filled 2021-05-12: qty 2

## 2021-05-12 MED ORDER — SODIUM CHLORIDE 0.9 % IV SOLN
20.0000 mg | Freq: Once | INTRAVENOUS | Status: AC
  Administered 2021-05-12: 20 mg via INTRAVENOUS
  Filled 2021-05-12: qty 20

## 2021-05-12 MED ORDER — FAMOTIDINE 20 MG IN NS 100 ML IVPB
20.0000 mg | Freq: Once | INTRAVENOUS | Status: AC
  Administered 2021-05-12: 20 mg via INTRAVENOUS
  Filled 2021-05-12: qty 100

## 2021-05-12 NOTE — Patient Instructions (Signed)
[facility]  Discharge Instructions: °Thank you for choosing [facility] to provide your oncology and hematology care.  ° °If you have a lab appointment with the Cancer Center, please go directly to the Cancer Center and check in at the registration area. °  °Wear comfortable clothing and clothing appropriate for easy access to any Portacath or PICC line.  ° °We strive to give you quality time with your provider. You may need to reschedule your appointment if you arrive late (15 or more minutes).  Arriving late affects you and other patients whose appointments are after yours.  Also, if you miss three or more appointments without notifying the office, you may be dismissed from the clinic at the provider’s discretion.    °  °For prescription refill requests, have your pharmacy contact our office and allow 72 hours for refills to be completed.   ° °Today you received the following chemotherapy and/or immunotherapy agents: Daratumumab.     °  °To help prevent nausea and vomiting after your treatment, we encourage you to take your nausea medication as directed. ° °BELOW ARE SYMPTOMS THAT SHOULD BE REPORTED IMMEDIATELY: °*FEVER GREATER THAN 100.4 F (38 °C) OR HIGHER °*CHILLS OR SWEATING °*NAUSEA AND VOMITING THAT IS NOT CONTROLLED WITH YOUR NAUSEA MEDICATION °*UNUSUAL SHORTNESS OF BREATH °*UNUSUAL BRUISING OR BLEEDING °*URINARY PROBLEMS (pain or burning when urinating, or frequent urination) °*BOWEL PROBLEMS (unusual diarrhea, constipation, pain near the anus) °TENDERNESS IN MOUTH AND THROAT WITH OR WITHOUT PRESENCE OF ULCERS (sore throat, sores in mouth, or a toothache) °UNUSUAL RASH, SWELLING OR PAIN  °UNUSUAL VAGINAL DISCHARGE OR ITCHING  ° °Items with * indicate a potential emergency and should be followed up as soon as possible or go to the Emergency Department if any problems should occur. ° °Please show the CHEMOTHERAPY ALERT CARD or IMMUNOTHERAPY ALERT CARD at check-in  to the Emergency Department and triage nurse. ° °Should you have questions after your visit or need to cancel or reschedule your appointment, please contact [facility]  Dept: [phone]  and follow the prompts.  Office hours are 8:00 a.m. to 4:30 p.m. Monday - Friday. Please note that voicemails left after 4:00 p.m. may not be returned until the following business day.  We are closed weekends and major holidays. You have access to a nurse at all times for urgent questions. Please call the main number to the clinic Dept: [phone] and follow the prompts. ° ° °For any non-urgent questions, you may also contact your provider using MyChart. We now offer e-Visits for anyone 18 and older to request care online for non-urgent symptoms. For details visit [email or web address]. °  °Also download the MyChart app! Go to the app store, search "MyChart", open the app, select [facility], and log in with your MyChart username and password. ° °Due to Covid, a mask is required upon entering the hospital/clinic. If you do not have a mask, one will be given to you upon arrival. For doctor visits, patients may have 1 support person aged 18 or older with them. For treatment visits, patients cannot have anyone with them due to current Covid guidelines and our immunocompromised population.  ° °

## 2021-05-18 ENCOUNTER — Encounter: Payer: Self-pay | Admitting: Hematology

## 2021-05-20 ENCOUNTER — Other Ambulatory Visit: Payer: Self-pay

## 2021-05-20 ENCOUNTER — Encounter: Payer: Self-pay | Admitting: Hematology

## 2021-05-20 ENCOUNTER — Telehealth: Payer: Self-pay | Admitting: Hematology

## 2021-05-20 DIAGNOSIS — C9000 Multiple myeloma not having achieved remission: Secondary | ICD-10-CM

## 2021-05-20 MED ORDER — LENALIDOMIDE 25 MG PO CAPS: 25.0000 mg | ORAL_CAPSULE | Freq: Every day | ORAL | 3 refills | Status: DC

## 2021-05-20 NOTE — Progress Notes (Signed)
[facility] FOLLOW-UP progress notes  DOS .05/12/2021  Patient Care Team: Harlan Stains, MD as PCP - General (Family Medicine) Brunetta Genera, MD as Consulting Physician (Hematology)  CHIEF COMPLAINTS/PURPOSE OF VISIT:  Multiple myeloma, ongoing treatment  HISTORY OF PRESENTING ILLNESS: Please see previous note  INTERVAL HISTORY  Tracy Fisher is a wonderful 75 y.o. female who is here for follow-up of her myeloma. She is here for C4D15 of Daratumumab.  Patient notes no new toxicities from her Daratumumab or Revlimid. No diarrhea.  No skin rashes.  Lab results today 05/12/2021 CBC stable with hemoglobin of 10.9, WBC count 3.8k platelets were 165k CMP within normal limits  myeloma panel from 04/28/2021 showed an M spike that is down to 0.1 g/dL  Overall patient notes she has been feeling well.  No infection issues. Grade 1 fatigue. Notes her neuropathy is minimal at this time and notes gradually resolving.    Oncology History  Smoldering multiple myeloma (SMM)  Multiple myeloma ([place])  08/28/2020 Initial Diagnosis   Multiple myeloma ([place])   09/09/2020 - 10/07/2020 Chemotherapy          01/20/2021 -  Chemotherapy   Patient is on Treatment Plan : MYELOMA NEWLY DIAGNOSED TRANSPLANT CANDIDATE DaraVRd (Daratumumab IV) q21d x 6 Cycles (Induction/Consolidation)       MEDICAL HISTORY:  Past Medical History:  Diagnosis Date   Bone cancer ([place]) 2018   started chemo pill in  2018   Diabetes mellitus without complication ([place])    Hypertension    Vocal cord cyst     SURGICAL HISTORY: No past surgical history on file.  SOCIAL HISTORY: Social History   Socioeconomic History   Marital status: Divorced    Spouse name: Not on file   Number of children: Not on file   Years of education: Not on file   Highest education level: Not on file  Occupational History   Not on file  Tobacco Use   Smoking status: Never   Smokeless tobacco: Never  Vaping  Use   Vaping Use: Never used  Substance and Sexual Activity   Alcohol use: No   Drug use: Never   Sexual activity: Not on file  Other Topics Concern   Not on file  Social History Narrative   Not on file   Social Determinants of Health   Financial Resource Strain: Not on file  Food Insecurity: Not on file  Transportation Needs: Not on file  Physical Activity: Not on file  Stress: Not on file  Social Connections: Not on file  Intimate Partner Violence: Not on file    FAMILY HISTORY: Family History  Problem Relation Age of Onset   Breast cancer Neg Hx     ALLERGIES:  is allergic to bortezomib and sulfa antibiotics.  MEDICATIONS:  Current Outpatient Medications  Medication Sig Dispense Refill   acyclovir (ZOVIRAX) 400 MG tablet Take 1 tablet (400 mg total) by mouth 2 (two) times daily. 60 tablet 3   amLODipine (NORVASC) 5 MG tablet      aspirin EC 81 MG tablet Take 81 mg by mouth daily. Swallow whole.     atorvastatin (LIPITOR) 20 MG tablet      b complex vitamins capsule Take 1 capsule by mouth daily. 30 capsule 11   brimonidine (ALPHAGAN) 0.2 % ophthalmic solution Place 1 drop into both eyes 2 (two) times daily.   3   Calcium Carbonate-Vitamin D (CALCIUM + D PO) Take 1 tablet by mouth daily.  dexamethasone (DECADRON) 4 MG tablet Take 5 tablets the day after daratumumab for cycle 5 & 6-- also take the day after day 8 Velcade and take daily for 2 days on non-chemo weeks (days 15 and 16). 20 tablet 5   dorzolamide-timolol (COSOPT) 22.3-6.8 MG/ML ophthalmic solution Place 1 drop into both eyes 2 (two) times daily.   12   DULoxetine (CYMBALTA) 60 MG capsule Take 1 capsule (60 mg total) by mouth daily. 30 capsule 2   gabapentin (NEURONTIN) 300 MG capsule Take 1 capsule (300 mg total) by mouth 3 (three) times daily. 90 capsule 0   HYDROcodone-acetaminophen (NORCO) 5-325 MG tablet Take 1 tablet by mouth every 6 (six) hours as needed for moderate pain. 60 tablet 0   lenalidomide  (REVLIMID) 25 MG capsule Take 1 capsule (25 mg total) by mouth daily. Take 21 days on, 7 days off, repeat every 28 days. 21 capsule 3   LUMIGAN 0.01 % SOLN INSTILL 1 DROP INTO EACH EYE ONCE DAILY     mirtazapine (REMERON) 15 MG tablet Take 15 mg by mouth at bedtime.     Multiple Vitamin (MULTIVITAMIN) capsule Take by mouth.     ondansetron (ZOFRAN) 8 MG tablet Take 1 tablet (8 mg total) by mouth 2 (two) times daily as needed (Nausea or vomiting). 30 tablet 1   ONETOUCH VERIO test strip USE STRIP TO CHECK GLUCOSE TWICE DAILY     potassium chloride SA (KLOR-CON) 20 MEQ tablet Take 1 tablet by mouth twice daily 60 tablet 0   metoCLOPramide (REGLAN) 10 MG tablet Take 1 tablet (10 mg total) by mouth every 8 (eight) hours as needed for nausea or vomiting. (Patient not taking: No sig reported) 6 tablet 0   No current facility-administered medications for this visit.    REVIEW OF SYSTEMS:   .10 Point review of Systems was done is negative except as noted above.  PHYSICAL EXAMINATION: ECOG PERFORMANCE STATUS: 1 - Symptomatic but completely ambulatory  Vitals:   05/12/21 0910  BP: 126/84  Pulse: 82  Resp: 20  Temp: 97.8 F (36.6 C)  SpO2: 94%   Filed Weights   05/12/21 0910  Weight: 153 lb 12.8 oz (69.8 kg)  . [name] GENERAL:alert, in no acute distress and comfortable SKIN: no acute rashes, no significant lesions EYES: conjunctiva are pink and non-injected, sclera anicteric OROPHARYNX: MMM, no exudates, no oropharyngeal erythema or ulceration NECK: supple, no JVD LYMPH:  no palpable lymphadenopathy in the cervical, axillary or inguinal regions LUNGS: clear to auscultation b/l with normal respiratory effort HEART: regular rate & rhythm ABDOMEN:  normoactive bowel sounds , non tender, not distended. Extremity: no pedal edema PSYCH: alert & oriented x 3 with fluent speech NEURO: no focal motor/sensory deficits  LABORATORY DATA:   CBC Latest Ref Rng & Units 05/12/2021 04/28/2021  04/14/2021  WBC 4.0 - 10.5 K/uL 3.8(L) 2.4(L) 3.6(L)  Hemoglobin 12.0 - 15.0 g/dL 10.9(L) 11.3(L) 10.9(L)  Hematocrit 36.0 - 46.0 % 32.2(L) 33.3(L) 32.6(L)  Platelets 150 - 400 K/uL 165 236 181   . CMP Latest Ref Rng & Units 05/12/2021 04/28/2021 04/14/2021  Glucose 70 - 99 mg/dL 98 99 117(H)  BUN 8 - 23 mg/dL '12 11 12  ' Creatinine 0.44 - 1.00 mg/dL 0.73 0.92 0.77  Sodium 135 - 145 mmol/L 139 138 140  Potassium 3.5 - 5.1 mmol/L 4.0 4.1 4.1  Chloride 98 - 111 mmol/L 110 108 109  CO2 22 - 32 mmol/L 20(L) 21(L) 20(L)  Calcium 8.9 -  10.3 mg/dL 9.1 9.3 9.3  Total Protein 6.5 - 8.1 g/dL 6.2(L) 6.4(L) 6.1(L)  Total Bilirubin 0.3 - 1.2 mg/dL 0.5 0.4 0.4  Alkaline Phos 38 - 126 U/L 85 102 96  AST 15 - 41 U/L '17 21 21  ' ALT 0 - 44 U/L '17 23 23     ' RADIOGRAPHIC STUDIES: I have personally reviewed the radiological images as listed and agreed with the findings in the report. No results found.  ASSESSMENT & PLAN:   75 y.o. female with   #1 IgG kappa multiple myeloma.   Initially presented as smoldering myeloma and had M spike of 1.9 g/dL which was noted to be IgG kappa on immunofixation electrophoresis. -Bone survey shows no overt evidence of osseous involvement suggestive of multiple myeloma. Beta 2 microglobulin, sedimentation rate an LDH levels are within normal limits. -Bone marrow biopsy shows previously showed 17% monoclonal plasma cells Cytogenetics/MolCy - trisomy 21 (consistent with myeloma)   -Bone study from 09/27/17 showed some osteopenic changes without osteoporosis. Previous bone studies are not yet available  -09/08/2019 FISH Panel revealed "No Mutations Detected". -09/08/2019 Cytogenetics revealed "Normal female karyotype". -09/08/2019 BM Bx Report ([id number])  revealed "BONE MARROW, ASPIRATE, CLOT, CORE: -Hypercellular bone marrow for age with plasma cell neoplasm PERIPHERAL BLOOD: -Macrocytic anemia -Leukopenia." - 20% Plasma Cells  -09/15/2019 PET/CT ([id number])  revealed "1. No findings of active myeloma or other hypermetabolic malignancy. 2.  Aortic Atherosclerosis (ICD10-I70.0). 3. Photopenic hypodense hepatic lesions most compatible with cysts."   A treatment initiation active Multiple myeloma Bone Marrow Biopsy with nearly 60% plasma cells. With M spike of 3.6g/dl Starting to develop mild anemia (does not meet anemia criteria yet) No hypercalcemia, renal insufficiency  PET/CT 07/31/2020 - No abnormal hypermetabolism in the neck, chest, abdomen or pelvis.   #2  Chronic leukopenia with intermittent neutropenia. -B12 and folate levels within normal limits. 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  -Reasonable to take a daily vitamin B complex tablet. -No indication for G-CSF at this time. -Now also additionally possibly due to Revlimid.   PLAN: -Discussed pt labwork today,  Lab results today 05/12/2021 CBC stable with hemoglobin of 10.9, WBC count 3.8k platelets were 165k CMP within normal limits myeloma panel from 04/28/2021 showed an M spike that is down to 0.1 g/dL -The pt has no prohibitive toxicities from continuing [place] at this time. Will continue to monitor. -will continue Dara with same supportive medications.  Continue same dose of Revlimid. -Anticipate 6 cycles of induction treatment prior to moving to maintenance Revlimid.  Again confirmed with patient and she is not inclined to consider consolidative high-dose chemotherapy with autologous hematopoietic stem cell transplant. -Continue Vitamin B Complex daily. -Continue Cymbalta. -flu shot today   FOLLOW UP: Please schedule next 2 cycles of daratumumab (4 doses every 2 weeks).  Port flush and labs with each treatment MD visit in 4 weeks Flu shot today   All questions were answered. The patient knows to call the clinic with any problems, questions or concerns.  . The total time spent in the appointment was 32 minutes and more than 50% was on  counseling and direct patient cares, ordering, toxicity evaluation management and coordination of ongoing antineoplastic treatment.    Sullivan Lone MD MS Hematology/Oncology Physician [facility]

## 2021-05-20 NOTE — Addendum Note (Signed)
Addended by: Sullivan Lone on: 05/20/2021 08:36 AM   Modules accepted: Orders

## 2021-05-20 NOTE — Telephone Encounter (Signed)
Left message with follow-up appointments per 11/14 los. 

## 2021-05-21 DIAGNOSIS — R5383 Other fatigue: Secondary | ICD-10-CM | POA: Diagnosis not present

## 2021-05-21 DIAGNOSIS — G620 Drug-induced polyneuropathy: Secondary | ICD-10-CM | POA: Diagnosis not present

## 2021-05-21 DIAGNOSIS — C9000 Multiple myeloma not having achieved remission: Secondary | ICD-10-CM | POA: Diagnosis not present

## 2021-05-21 DIAGNOSIS — R0981 Nasal congestion: Secondary | ICD-10-CM | POA: Diagnosis not present

## 2021-05-23 MED FILL — Dexamethasone Sodium Phosphate Inj 100 MG/10ML: INTRAMUSCULAR | Qty: 2 | Status: AC

## 2021-05-26 ENCOUNTER — Other Ambulatory Visit: Payer: Medicare HMO

## 2021-05-26 ENCOUNTER — Inpatient Hospital Stay: Payer: Medicare HMO

## 2021-05-26 ENCOUNTER — Other Ambulatory Visit: Payer: Self-pay

## 2021-05-26 VITALS — BP 125/65 | HR 78 | Temp 98.8°F | Resp 18 | Wt 152.4 lb

## 2021-05-26 DIAGNOSIS — C9000 Multiple myeloma not having achieved remission: Secondary | ICD-10-CM

## 2021-05-26 DIAGNOSIS — Z7189 Other specified counseling: Secondary | ICD-10-CM

## 2021-05-26 DIAGNOSIS — D709 Neutropenia, unspecified: Secondary | ICD-10-CM | POA: Diagnosis not present

## 2021-05-26 DIAGNOSIS — Z5112 Encounter for antineoplastic immunotherapy: Secondary | ICD-10-CM | POA: Diagnosis not present

## 2021-05-26 DIAGNOSIS — I10 Essential (primary) hypertension: Secondary | ICD-10-CM | POA: Diagnosis not present

## 2021-05-26 DIAGNOSIS — Z7982 Long term (current) use of aspirin: Secondary | ICD-10-CM | POA: Diagnosis not present

## 2021-05-26 DIAGNOSIS — Z23 Encounter for immunization: Secondary | ICD-10-CM | POA: Diagnosis not present

## 2021-05-26 DIAGNOSIS — Z79899 Other long term (current) drug therapy: Secondary | ICD-10-CM | POA: Diagnosis not present

## 2021-05-26 LAB — CBC WITH DIFFERENTIAL/PLATELET
Abs Immature Granulocytes: 0.01 10*3/uL (ref 0.00–0.07)
Basophils Absolute: 0.1 10*3/uL (ref 0.0–0.1)
Basophils Relative: 2 %
Eosinophils Absolute: 0.4 10*3/uL (ref 0.0–0.5)
Eosinophils Relative: 13 %
HCT: 33.4 % — ABNORMAL LOW (ref 36.0–46.0)
Hemoglobin: 11.2 g/dL — ABNORMAL LOW (ref 12.0–15.0)
Immature Granulocytes: 0 %
Lymphocytes Relative: 32 %
Lymphs Abs: 1 10*3/uL (ref 0.7–4.0)
MCH: 32.8 pg (ref 26.0–34.0)
MCHC: 33.5 g/dL (ref 30.0–36.0)
MCV: 97.9 fL (ref 80.0–100.0)
Monocytes Absolute: 0.3 10*3/uL (ref 0.1–1.0)
Monocytes Relative: 10 %
Neutro Abs: 1.3 10*3/uL — ABNORMAL LOW (ref 1.7–7.7)
Neutrophils Relative %: 43 %
Platelets: 182 10*3/uL (ref 150–400)
RBC: 3.41 MIL/uL — ABNORMAL LOW (ref 3.87–5.11)
RDW: 15.6 % — ABNORMAL HIGH (ref 11.5–15.5)
WBC: 3.2 10*3/uL — ABNORMAL LOW (ref 4.0–10.5)
nRBC: 0 % (ref 0.0–0.2)

## 2021-05-26 LAB — CMP (CANCER CENTER ONLY)
ALT: 19 U/L (ref 0–44)
AST: 16 U/L (ref 15–41)
Albumin: 3.5 g/dL (ref 3.5–5.0)
Alkaline Phosphatase: 81 U/L (ref 38–126)
Anion gap: 10 (ref 5–15)
BUN: 10 mg/dL (ref 8–23)
CO2: 21 mmol/L — ABNORMAL LOW (ref 22–32)
Calcium: 9.2 mg/dL (ref 8.9–10.3)
Chloride: 112 mmol/L — ABNORMAL HIGH (ref 98–111)
Creatinine: 0.78 mg/dL (ref 0.44–1.00)
GFR, Estimated: >60 mL/min (ref >60)
Glucose, Bld: 117 mg/dL — ABNORMAL HIGH (ref 70–99)
Potassium: 3.7 mmol/L (ref 3.5–5.1)
Sodium: 143 mmol/L (ref 135–145)
Total Bilirubin: 0.4 mg/dL (ref 0.3–1.2)
Total Protein: 6.1 g/dL — ABNORMAL LOW (ref 6.5–8.1)

## 2021-05-26 MED ORDER — SODIUM CHLORIDE 0.9 % IV SOLN
Freq: Once | INTRAVENOUS | Status: AC
Start: 2021-05-26 — End: 2021-05-26

## 2021-05-26 MED ORDER — SODIUM CHLORIDE 0.9 % IV SOLN
16.0000 mg/kg | Freq: Once | INTRAVENOUS | Status: AC
  Administered 2021-05-26: 11:00:00 1100 mg via INTRAVENOUS
  Filled 2021-05-26: qty 15

## 2021-05-26 MED ORDER — ACETAMINOPHEN 325 MG PO TABS
650.0000 mg | ORAL_TABLET | Freq: Once | ORAL | Status: AC
  Administered 2021-05-26: 10:00:00 650 mg via ORAL
  Filled 2021-05-26: qty 2

## 2021-05-26 MED ORDER — FAMOTIDINE 20 MG IN NS 100 ML IVPB
20.0000 mg | Freq: Once | INTRAVENOUS | Status: AC
  Administered 2021-05-26: 10:00:00 20 mg via INTRAVENOUS
  Filled 2021-05-26: qty 100

## 2021-05-26 MED ORDER — DIPHENHYDRAMINE HCL 25 MG PO CAPS
50.0000 mg | ORAL_CAPSULE | Freq: Once | ORAL | Status: AC
  Administered 2021-05-26: 10:00:00 50 mg via ORAL
  Filled 2021-05-26: qty 2

## 2021-05-26 MED ORDER — SODIUM CHLORIDE 0.9 % IV SOLN
20.0000 mg | Freq: Once | INTRAVENOUS | Status: AC
  Administered 2021-05-26: 10:00:00 20 mg via INTRAVENOUS
  Filled 2021-05-26: qty 20

## 2021-05-26 NOTE — Patient Instructions (Signed)
[facility]  Discharge Instructions: °Thank you for choosing [facility] to provide your oncology and hematology care.  ° °If you have a lab appointment with the Cancer Center, please go directly to the Cancer Center and check in at the registration area. °  °Wear comfortable clothing and clothing appropriate for easy access to any Portacath or PICC line.  ° °We strive to give you quality time with your provider. You may need to reschedule your appointment if you arrive late (15 or more minutes).  Arriving late affects you and other patients whose appointments are after yours.  Also, if you miss three or more appointments without notifying the office, you may be dismissed from the clinic at the provider’s discretion.    °  °For prescription refill requests, have your pharmacy contact our office and allow 72 hours for refills to be completed.   ° °Today you received the following chemotherapy and/or immunotherapy agents: Darzalex  °  °To help prevent nausea and vomiting after your treatment, we encourage you to take your nausea medication as directed. ° °BELOW ARE SYMPTOMS THAT SHOULD BE REPORTED IMMEDIATELY: °*FEVER GREATER THAN 100.4 F (38 °C) OR HIGHER °*CHILLS OR SWEATING °*NAUSEA AND VOMITING THAT IS NOT CONTROLLED WITH YOUR NAUSEA MEDICATION °*UNUSUAL SHORTNESS OF BREATH °*UNUSUAL BRUISING OR BLEEDING °*URINARY PROBLEMS (pain or burning when urinating, or frequent urination) °*BOWEL PROBLEMS (unusual diarrhea, constipation, pain near the anus) °TENDERNESS IN MOUTH AND THROAT WITH OR WITHOUT PRESENCE OF ULCERS (sore throat, sores in mouth, or a toothache) °UNUSUAL RASH, SWELLING OR PAIN  °UNUSUAL VAGINAL DISCHARGE OR ITCHING  ° °Items with * indicate a potential emergency and should be followed up as soon as possible or go to the Emergency Department if any problems should occur. ° °Please show the CHEMOTHERAPY ALERT CARD or IMMUNOTHERAPY ALERT CARD at check-in to the  Emergency Department and triage nurse. ° °Should you have questions after your visit or need to cancel or reschedule your appointment, please contact [facility]  Dept: [phone]  and follow the prompts.  Office hours are 8:00 a.m. to 4:30 p.m. Monday - Friday. Please note that voicemails left after 4:00 p.m. may not be returned until the following business day.  We are closed weekends and major holidays. You have access to a nurse at all times for urgent questions. Please call the main number to the clinic Dept: [phone] and follow the prompts. ° ° °For any non-urgent questions, you may also contact your provider using MyChart. We now offer e-Visits for anyone 18 and older to request care online for non-urgent symptoms. For details visit [email or web address]. °  °Also download the MyChart app! Go to the app store, search "MyChart", open the app, select [facility], and log in with your MyChart username and password. ° °Due to Covid, a mask is required upon entering the hospital/clinic. If you do not have a mask, one will be given to you upon arrival. For doctor visits, patients may have 1 support person aged 18 or older with them. For treatment visits, patients cannot have anyone with them due to current Covid guidelines and our immunocompromised population.  ° °

## 2021-05-26 NOTE — Progress Notes (Signed)
Per Dr. Irene Limbo, ok to treat with ANC of 1.3.

## 2021-05-29 ENCOUNTER — Other Ambulatory Visit: Payer: Self-pay

## 2021-05-29 DIAGNOSIS — C9000 Multiple myeloma not having achieved remission: Secondary | ICD-10-CM

## 2021-05-29 MED ORDER — DULOXETINE HCL 60 MG PO CPEP: 60.0000 mg | ORAL_CAPSULE | Freq: Every day | ORAL | 2 refills | Status: DC

## 2021-05-29 MED ORDER — POTASSIUM CHLORIDE CRYS ER 20 MEQ PO TBCR: 20.0000 meq | EXTENDED_RELEASE_TABLET | Freq: Two times a day (BID) | ORAL | 0 refills | Status: DC

## 2021-06-06 MED FILL — Dexamethasone Sodium Phosphate Inj 100 MG/10ML: INTRAMUSCULAR | Qty: 2 | Status: AC

## 2021-06-09 ENCOUNTER — Inpatient Hospital Stay: Payer: Medicare HMO

## 2021-06-09 ENCOUNTER — Other Ambulatory Visit: Payer: Self-pay

## 2021-06-09 ENCOUNTER — Other Ambulatory Visit: Payer: Medicare HMO

## 2021-06-09 ENCOUNTER — Inpatient Hospital Stay: Payer: Medicare HMO | Attending: Hematology

## 2021-06-09 ENCOUNTER — Inpatient Hospital Stay (HOSPITAL_BASED_OUTPATIENT_CLINIC_OR_DEPARTMENT_OTHER): Payer: Medicare HMO | Admitting: Hematology

## 2021-06-09 VITALS — BP 153/72 | HR 88 | Temp 97.9°F | Resp 18 | Ht 64.0 in | Wt 155.4 lb

## 2021-06-09 VITALS — BP 123/63 | HR 78 | Temp 98.2°F | Resp 18

## 2021-06-09 DIAGNOSIS — C9000 Multiple myeloma not having achieved remission: Secondary | ICD-10-CM

## 2021-06-09 DIAGNOSIS — Z7982 Long term (current) use of aspirin: Secondary | ICD-10-CM | POA: Insufficient documentation

## 2021-06-09 DIAGNOSIS — Z7189 Other specified counseling: Secondary | ICD-10-CM

## 2021-06-09 DIAGNOSIS — D539 Nutritional anemia, unspecified: Secondary | ICD-10-CM | POA: Insufficient documentation

## 2021-06-09 DIAGNOSIS — D72819 Decreased white blood cell count, unspecified: Secondary | ICD-10-CM | POA: Diagnosis not present

## 2021-06-09 DIAGNOSIS — Z5111 Encounter for antineoplastic chemotherapy: Secondary | ICD-10-CM

## 2021-06-09 DIAGNOSIS — Z7961 Long term (current) use of immunomodulator: Secondary | ICD-10-CM | POA: Insufficient documentation

## 2021-06-09 DIAGNOSIS — D709 Neutropenia, unspecified: Secondary | ICD-10-CM | POA: Diagnosis not present

## 2021-06-09 DIAGNOSIS — G629 Polyneuropathy, unspecified: Secondary | ICD-10-CM | POA: Diagnosis not present

## 2021-06-09 DIAGNOSIS — Z7952 Long term (current) use of systemic steroids: Secondary | ICD-10-CM | POA: Insufficient documentation

## 2021-06-09 DIAGNOSIS — Z5112 Encounter for antineoplastic immunotherapy: Secondary | ICD-10-CM | POA: Insufficient documentation

## 2021-06-09 DIAGNOSIS — D649 Anemia, unspecified: Secondary | ICD-10-CM

## 2021-06-09 DIAGNOSIS — Z79899 Other long term (current) drug therapy: Secondary | ICD-10-CM | POA: Diagnosis not present

## 2021-06-09 LAB — CBC WITH DIFFERENTIAL/PLATELET
Abs Immature Granulocytes: 0.01 10*3/uL (ref 0.00–0.07)
Basophils Absolute: 0.1 10*3/uL (ref 0.0–0.1)
Basophils Relative: 3 %
Eosinophils Absolute: 0.2 10*3/uL (ref 0.0–0.5)
Eosinophils Relative: 5 %
HCT: 33.2 % — ABNORMAL LOW (ref 36.0–46.0)
Hemoglobin: 10.9 g/dL — ABNORMAL LOW (ref 12.0–15.0)
Immature Granulocytes: 0 %
Lymphocytes Relative: 38 %
Lymphs Abs: 1.1 10*3/uL (ref 0.7–4.0)
MCH: 32.4 pg (ref 26.0–34.0)
MCHC: 32.8 g/dL (ref 30.0–36.0)
MCV: 98.8 fL (ref 80.0–100.0)
Monocytes Absolute: 0.2 10*3/uL (ref 0.1–1.0)
Monocytes Relative: 8 %
Neutro Abs: 1.4 10*3/uL — ABNORMAL LOW (ref 1.7–7.7)
Neutrophils Relative %: 46 %
Platelets: 174 10*3/uL (ref 150–400)
RBC: 3.36 MIL/uL — ABNORMAL LOW (ref 3.87–5.11)
RDW: 15.6 % — ABNORMAL HIGH (ref 11.5–15.5)
WBC: 3 10*3/uL — ABNORMAL LOW (ref 4.0–10.5)
nRBC: 0 % (ref 0.0–0.2)

## 2021-06-09 LAB — CMP (CANCER CENTER ONLY)
ALT: 17 U/L (ref 0–44)
AST: 18 U/L (ref 15–41)
Albumin: 3.8 g/dL (ref 3.5–5.0)
Alkaline Phosphatase: 73 U/L (ref 38–126)
Anion gap: 9 (ref 5–15)
BUN: 13 mg/dL (ref 8–23)
CO2: 21 mmol/L — ABNORMAL LOW (ref 22–32)
Calcium: 8.8 mg/dL — ABNORMAL LOW (ref 8.9–10.3)
Chloride: 111 mmol/L (ref 98–111)
Creatinine: 0.71 mg/dL (ref 0.44–1.00)
GFR, Estimated: >60 mL/min (ref >60)
Glucose, Bld: 94 mg/dL (ref 70–99)
Potassium: 3.8 mmol/L (ref 3.5–5.1)
Sodium: 141 mmol/L (ref 135–145)
Total Bilirubin: 0.3 mg/dL (ref 0.3–1.2)
Total Protein: 6 g/dL — ABNORMAL LOW (ref 6.5–8.1)

## 2021-06-09 MED ORDER — SODIUM CHLORIDE 0.9 % IV SOLN
20.0000 mg | Freq: Once | INTRAVENOUS | Status: AC
  Administered 2021-06-09: 20 mg via INTRAVENOUS
  Filled 2021-06-09: qty 20

## 2021-06-09 MED ORDER — FAMOTIDINE 20 MG IN NS 100 ML IVPB
20.0000 mg | Freq: Once | INTRAVENOUS | Status: AC
  Administered 2021-06-09: 20 mg via INTRAVENOUS
  Filled 2021-06-09: qty 100

## 2021-06-09 MED ORDER — ACETAMINOPHEN 325 MG PO TABS
650.0000 mg | ORAL_TABLET | Freq: Once | ORAL | Status: AC
  Administered 2021-06-09: 650 mg via ORAL
  Filled 2021-06-09: qty 2

## 2021-06-09 MED ORDER — SODIUM CHLORIDE 0.9 % IV SOLN
Freq: Once | INTRAVENOUS | Status: AC
Start: 2021-06-09 — End: 2021-06-09

## 2021-06-09 MED ORDER — SODIUM CHLORIDE 0.9 % IV SOLN
16.0000 mg/kg | Freq: Once | INTRAVENOUS | Status: AC
  Administered 2021-06-09: 1100 mg via INTRAVENOUS
  Filled 2021-06-09: qty 40
  Filled 2021-06-09: qty 55

## 2021-06-09 MED ORDER — DIPHENHYDRAMINE HCL 25 MG PO CAPS
50.0000 mg | ORAL_CAPSULE | Freq: Once | ORAL | Status: AC
  Administered 2021-06-09: 50 mg via ORAL
  Filled 2021-06-09: qty 2

## 2021-06-09 NOTE — Progress Notes (Signed)
[place] FOLLOW-UP progress notes  DOS .06/09/2021  Patient Care Team: Harlan Stains, MD as PCP - General (Family Medicine) Brunetta Genera, MD as Consulting Physician (Hematology)  CHIEF COMPLAINTS/PURPOSE OF VISIT:  Follow-up for ongoing evaluation and management of multiple myeloma  HISTORY OF PRESENTING ILLNESS: Please see previous note  INTERVAL HISTORY  Tracy Fisher is a wonderful 75 year old lady here for continued evaluation and management of her multiple myeloma.   Patient notes some grade 1 neuropathy symptoms with primarily some numbness in her forefeet.  No significant pain.  Symptoms fairly well controlled with gabapentin. We also talked about consideration of over-the-counter supplement called Palmitoylethanolamine.  I wrote this down for her. No new bone pains. No fevers no chills no night sweats. No prohibitive toxicities from her daratumumab/Revlimid/dexamethasone at this time. She is in good spirits. Labs done today reviewed with her in detail.  Oncology History  Smoldering multiple myeloma (SMM)  Multiple myeloma ([facility])  08/28/2020 Initial Diagnosis   Multiple myeloma ([facility])   09/09/2020 - 10/07/2020 Chemotherapy          01/20/2021 -  Chemotherapy   Patient is on Treatment Plan : MYELOMA NEWLY DIAGNOSED TRANSPLANT CANDIDATE DaraVRd (Daratumumab IV) q21d x 6 Cycles (Induction/Consolidation)       MEDICAL HISTORY:  Past Medical History:  Diagnosis Date   Bone cancer ([facility]) 2018   started chemo pill in  2018   Diabetes mellitus without complication ([facility])    Hypertension    Vocal cord cyst     SURGICAL HISTORY: No past surgical history on file.  SOCIAL HISTORY: Social History   Socioeconomic History   Marital status: Divorced    Spouse name: Not on file   Number of children: Not on file   Years of education: Not on file   Highest education level: Not on file  Occupational History   Not on file  Tobacco Use    Smoking status: Never   Smokeless tobacco: Never  Vaping Use   Vaping Use: Never used  Substance and Sexual Activity   Alcohol use: No   Drug use: Never   Sexual activity: Not on file  Other Topics Concern   Not on file  Social History Narrative   Not on file   Social Determinants of Health   Financial Resource Strain: Not on file  Food Insecurity: Not on file  Transportation Needs: Not on file  Physical Activity: Not on file  Stress: Not on file  Social Connections: Not on file  Intimate Partner Violence: Not on file    FAMILY HISTORY: Family History  Problem Relation Age of Onset   Breast cancer Neg Hx     ALLERGIES:  is allergic to bortezomib and sulfa antibiotics.  MEDICATIONS:  Current Outpatient Medications  Medication Sig Dispense Refill   acyclovir (ZOVIRAX) 400 MG tablet Take 1 tablet (400 mg total) by mouth 2 (two) times daily. 60 tablet 3   amLODipine (NORVASC) 5 MG tablet      aspirin EC 81 MG tablet Take 81 mg by mouth daily. Swallow whole.     atorvastatin (LIPITOR) 20 MG tablet      b complex vitamins capsule Take 1 capsule by mouth daily. 30 capsule 11   brimonidine (ALPHAGAN) 0.2 % ophthalmic solution Place 1 drop into both eyes 2 (two) times daily.   3   Calcium Carbonate-Vitamin D (CALCIUM + D PO) Take 1 tablet by mouth daily.     dexamethasone (DECADRON) 4  MG tablet Take 5 tablets the day after daratumumab for cycle 5 & 6-- also take the day after day 8 Velcade and take daily for 2 days on non-chemo weeks (days 15 and 16). 20 tablet 5   dorzolamide-timolol (COSOPT) 22.3-6.8 MG/ML ophthalmic solution Place 1 drop into both eyes 2 (two) times daily.   12   DULoxetine (CYMBALTA) 60 MG capsule Take 1 capsule (60 mg total) by mouth daily. 30 capsule 2   gabapentin (NEURONTIN) 300 MG capsule Take 1 capsule (300 mg total) by mouth 3 (three) times daily. 90 capsule 0   HYDROcodone-acetaminophen (NORCO) 5-325 MG tablet Take 1 tablet by mouth every 6 (six)  hours as needed for moderate pain. 60 tablet 0   lenalidomide (REVLIMID) 25 MG capsule Take 1 capsule (25 mg total) by mouth daily. Take 21 days on, 7 days off, repeat every 28 days. 21 capsule 3   LUMIGAN 0.01 % SOLN INSTILL 1 DROP INTO EACH EYE ONCE DAILY     metoCLOPramide (REGLAN) 10 MG tablet Take 1 tablet (10 mg total) by mouth every 8 (eight) hours as needed for nausea or vomiting. (Patient not taking: No sig reported) 6 tablet 0   mirtazapine (REMERON) 15 MG tablet Take 15 mg by mouth at bedtime.     Multiple Vitamin (MULTIVITAMIN) capsule Take by mouth.     ondansetron (ZOFRAN) 8 MG tablet Take 1 tablet (8 mg total) by mouth 2 (two) times daily as needed (Nausea or vomiting). 30 tablet 1   ONETOUCH VERIO test strip USE STRIP TO CHECK GLUCOSE TWICE DAILY     potassium chloride SA (KLOR-CON M) 20 MEQ tablet Take 1 tablet (20 mEq total) by mouth 2 (two) times daily. 60 tablet 0   No current facility-administered medications for this visit.    REVIEW OF SYSTEMS:   .10 Point review of Systems was done is negative except as noted above.   PHYSICAL EXAMINATION: ECOG PERFORMANCE STATUS: 1 - Symptomatic but completely ambulatory  Vitals:   06/09/21 0955  BP: (!) 153/72  Pulse: 88  Resp: 18  Temp: 97.9 F (36.6 C)  SpO2: 95%   Filed Weights   06/09/21 0955  Weight: 155 lb 6.4 oz (70.5 kg)  . GENERAL:alert, in no acute distress and comfortable SKIN: no acute rashes, no significant lesions EYES: conjunctiva are pink and non-injected, sclera anicteric OROPHARYNX: MMM, no exudates, no oropharyngeal erythema or ulceration NECK: supple, no JVD LYMPH:  no palpable lymphadenopathy in the cervical, axillary or inguinal regions LUNGS: clear to auscultation b/l with normal respiratory effort HEART: regular rate & rhythm ABDOMEN:  normoactive bowel sounds , non tender, not distended. Extremity: no pedal edema PSYCH: alert & oriented x 3 with fluent speech NEURO: no focal motor/sensory  deficits  LABORATORY DATA:   CBC Latest Ref Rng & Units 06/09/2021 05/26/2021 05/12/2021  WBC 4.0 - 10.5 K/uL 3.0(L) 3.2(L) 3.8(L)  Hemoglobin 12.0 - 15.0 g/dL 10.9(L) 11.2(L) 10.9(L)  Hematocrit 36.0 - 46.0 % 33.2(L) 33.4(L) 32.2(L)  Platelets 150 - 400 K/uL 174 182 165   . CMP Latest Ref Rng & Units 05/26/2021 05/12/2021 04/28/2021  Glucose 70 - 99 mg/dL 117(H) 98 99  BUN 8 - 23 mg/dL _0 Creatinine 0.44 - 1.00 mg/dL 0.78 0.73 0.92  Sodium 135 - 145 mmol/L 143 139 138  Potassium 3.5 - 5.1 mmol/L 3.7 4.0 4.1  Chloride 98 - 111 mmol/L 112(H) 110 108  CO2 22 - 32 mmol/L 21(L) 20(L) 21(L)  Calcium 8.9 - 10.3 mg/dL 9.2 9.1 9.3  Total Protein 6.5 - 8.1 g/dL 6.1(L) 6.2(L) 6.4(L)  Total Bilirubin 0.3 - 1.2 mg/dL 0.4 0.5 0.4  Alkaline Phos 38 - 126 U/L 81 85 102  AST 15 - 41 U/L _0 ALT 0 - 44 U/L _1 RADIOGRAPHIC STUDIES: I have personally reviewed the radiological images as listed and agreed with the findings in the report. No results found.  ASSESSMENT & PLAN:   75 y.o. female here for follow-up of her multiple myeloma and next treatment   #1 IgG kappa multiple myeloma.   Initially presented as smoldering myeloma and had M spike of 1.9 g/dL which was noted to be IgG kappa on immunofixation electrophoresis. -Bone survey shows no overt evidence of osseous involvement suggestive of multiple myeloma. Beta 2 microglobulin, sedimentation rate an LDH levels are within normal limits. -Bone marrow biopsy shows previously showed 17% monoclonal plasma cells Cytogenetics/MolCy - trisomy 78 (consistent with myeloma)   -Bone study from 09/27/17 showed some osteopenic changes without osteoporosis. Previous bone studies are not yet available  -09/08/2019 FISH Panel revealed "No Mutations Detected". -09/08/2019 Cytogenetics revealed "Normal female karyotype". -09/08/2019 BM Bx Report ([id number])  revealed "BONE MARROW, ASPIRATE, CLOT, CORE: -Hypercellular bone marrow  for age with plasma cell neoplasm PERIPHERAL BLOOD: -Macrocytic anemia -Leukopenia." - 20% Plasma Cells  -09/15/2019 PET/CT ([id number]) revealed "1. No findings of active myeloma or other hypermetabolic malignancy. 2.  Aortic Atherosclerosis (ICD10-I70.0). 3. Photopenic hypodense hepatic lesions most compatible with cysts."   A treatment initiation active Multiple myeloma Bone Marrow Biopsy with nearly 60% plasma cells. With M spike of 3.6g/dl Starting to develop mild anemia (does not meet anemia criteria yet) No hypercalcemia, renal insufficiency  PET/CT 07/31/2020 - No abnormal hypermetabolism in the neck, chest, abdomen or pelvis.   #2  Chronic leukopenia with intermittent neutropenia. -B12 and folate levels within normal limits. 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  -Reasonable to take a daily vitamin B complex tablet. -No indication for G-CSF at this time. -Now also additionally possibly due to Revlimid.   2.  Mild anemia and leukopenia likely related to myeloma and Revlimid. PLAN: -Patient's labs were discussed with her in details .  She has mild anemia and mild leukopenia related to myeloma and treatment. -She has no prohibitive toxicities from her current daratumumab/Revlimid/dexamethasone treatment and is appropriate to proceed with cycle 5-day 15 of treatment today. -Lab or clinical symptoms of myeloma progression at this time. -Was hoping to complete her induction treatment with her next and hopefully last sixth cycle of daratumumab/Revlimid/dexamethasone. -We will repeat myeloma labs in 2 weeks with cycle 6-day 1. -Discussed with patient and will get CT-guided bone marrow biopsy and PET CT scan in the next 2 weeks or so. -schedule cycle 6 daratumumab/Revlimid/dexamethasone as per orders.  -I shall see her back with cycle 6-day 15 to reevaluate her treatment, follow-up on labs bone marrow results and PET scan to determine if we can transition to  maintenance treatment. -Patient is still certain she would not want to consider consolidative bone marrow transplant for her myeloma.  3.  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 3 times daily -Discussed using supplement Palmitoylethanolamine OTC at about 400 to 800 mg p.o. daily to help with her neuropathy. -Continue vitamin B complex daily -Continue current dose of Cymbalta    FOLLOW UP:  PET/CT in 2 weeks CT bone marrow aspiration biopsy in 2 weeks Please schedule cycle 6 of daratumumab as per orders. Port flush and labs with each treatment MD visit in 4 weeks with cycle 6-day 15  All questions were answered. The patient knows to call the clinic with any problems, questions or concerns.  . The total time spent in the appointment was 12mns in reviewing labs, toxicity assessment, reviewing ordering and management of daratumumab and Revlimid chemotherapy, management of and discussion of her neuropathy.   GSullivan LoneMD MS Hematology/Oncology Physician [facility]

## 2021-06-09 NOTE — Progress Notes (Signed)
Dr Irene Limbo aware of today's labs. ANC 1.4. Pt is ok for tx today.

## 2021-06-09 NOTE — Patient Instructions (Signed)
[facility]  Discharge Instructions: °Thank you for choosing [facility] to provide your oncology and hematology care.  ° °If you have a lab appointment with the Cancer Center, please go directly to the Cancer Center and check in at the registration area. °  °Wear comfortable clothing and clothing appropriate for easy access to any Portacath or PICC line.  ° °We strive to give you quality time with your provider. You may need to reschedule your appointment if you arrive late (15 or more minutes).  Arriving late affects you and other patients whose appointments are after yours.  Also, if you miss three or more appointments without notifying the office, you may be dismissed from the clinic at the provider’s discretion.    °  °For prescription refill requests, have your pharmacy contact our office and allow 72 hours for refills to be completed.   ° °Today you received the following chemotherapy and/or immunotherapy agents: Daratumumab.     °  °To help prevent nausea and vomiting after your treatment, we encourage you to take your nausea medication as directed. ° °BELOW ARE SYMPTOMS THAT SHOULD BE REPORTED IMMEDIATELY: °*FEVER GREATER THAN 100.4 F (38 °C) OR HIGHER °*CHILLS OR SWEATING °*NAUSEA AND VOMITING THAT IS NOT CONTROLLED WITH YOUR NAUSEA MEDICATION °*UNUSUAL SHORTNESS OF BREATH °*UNUSUAL BRUISING OR BLEEDING °*URINARY PROBLEMS (pain or burning when urinating, or frequent urination) °*BOWEL PROBLEMS (unusual diarrhea, constipation, pain near the anus) °TENDERNESS IN MOUTH AND THROAT WITH OR WITHOUT PRESENCE OF ULCERS (sore throat, sores in mouth, or a toothache) °UNUSUAL RASH, SWELLING OR PAIN  °UNUSUAL VAGINAL DISCHARGE OR ITCHING  ° °Items with * indicate a potential emergency and should be followed up as soon as possible or go to the Emergency Department if any problems should occur. ° °Please show the CHEMOTHERAPY ALERT CARD or IMMUNOTHERAPY ALERT CARD at check-in  to the Emergency Department and triage nurse. ° °Should you have questions after your visit or need to cancel or reschedule your appointment, please contact [facility]  Dept: [phone]  and follow the prompts.  Office hours are 8:00 a.m. to 4:30 p.m. Monday - Friday. Please note that voicemails left after 4:00 p.m. may not be returned until the following business day.  We are closed weekends and major holidays. You have access to a nurse at all times for urgent questions. Please call the main number to the clinic Dept: [phone] and follow the prompts. ° ° °For any non-urgent questions, you may also contact your provider using MyChart. We now offer e-Visits for anyone 18 and older to request care online for non-urgent symptoms. For details visit [email or web address]. °  °Also download the MyChart app! Go to the app store, search "MyChart", open the app, select [facility], and log in with your MyChart username and password. ° °Due to Covid, a mask is required upon entering the hospital/clinic. If you do not have a mask, one will be given to you upon arrival. For doctor visits, patients may have 1 support person aged 18 or older with them. For treatment visits, patients cannot have anyone with them due to current Covid guidelines and our immunocompromised population.  ° °

## 2021-06-17 ENCOUNTER — Other Ambulatory Visit: Payer: Self-pay

## 2021-06-17 DIAGNOSIS — C9000 Multiple myeloma not having achieved remission: Secondary | ICD-10-CM

## 2021-06-18 ENCOUNTER — Other Ambulatory Visit: Payer: Self-pay

## 2021-06-18 DIAGNOSIS — C9000 Multiple myeloma not having achieved remission: Secondary | ICD-10-CM

## 2021-06-18 MED ORDER — ACYCLOVIR 400 MG PO TABS: 400.0000 mg | ORAL_TABLET | Freq: Two times a day (BID) | ORAL | 3 refills | Status: DC

## 2021-06-18 MED ORDER — LENALIDOMIDE 25 MG PO CAPS: 25.0000 mg | ORAL_CAPSULE | Freq: Every day | ORAL | 3 refills | Status: DC

## 2021-06-19 ENCOUNTER — Encounter: Payer: Self-pay | Admitting: Hematology

## 2021-06-19 MED ORDER — HYDROCODONE-ACETAMINOPHEN 5-325 MG PO TABS: 1.0000 | ORAL_TABLET | Freq: Four times a day (QID) | ORAL | 0 refills | Status: DC | PRN

## 2021-06-24 ENCOUNTER — Other Ambulatory Visit: Payer: Medicare HMO

## 2021-06-24 ENCOUNTER — Inpatient Hospital Stay: Payer: Medicare HMO

## 2021-06-24 ENCOUNTER — Other Ambulatory Visit: Payer: Self-pay

## 2021-06-24 VITALS — BP 132/66 | HR 79 | Temp 98.3°F | Resp 17 | Wt 153.0 lb

## 2021-06-24 DIAGNOSIS — Z7961 Long term (current) use of immunomodulator: Secondary | ICD-10-CM | POA: Diagnosis not present

## 2021-06-24 DIAGNOSIS — Z7189 Other specified counseling: Secondary | ICD-10-CM

## 2021-06-24 DIAGNOSIS — C9000 Multiple myeloma not having achieved remission: Secondary | ICD-10-CM | POA: Diagnosis not present

## 2021-06-24 DIAGNOSIS — D539 Nutritional anemia, unspecified: Secondary | ICD-10-CM | POA: Diagnosis not present

## 2021-06-24 DIAGNOSIS — Z7982 Long term (current) use of aspirin: Secondary | ICD-10-CM | POA: Diagnosis not present

## 2021-06-24 DIAGNOSIS — Z7952 Long term (current) use of systemic steroids: Secondary | ICD-10-CM | POA: Diagnosis not present

## 2021-06-24 DIAGNOSIS — D72819 Decreased white blood cell count, unspecified: Secondary | ICD-10-CM | POA: Diagnosis not present

## 2021-06-24 DIAGNOSIS — Z5112 Encounter for antineoplastic immunotherapy: Secondary | ICD-10-CM | POA: Diagnosis not present

## 2021-06-24 DIAGNOSIS — Z79899 Other long term (current) drug therapy: Secondary | ICD-10-CM | POA: Diagnosis not present

## 2021-06-24 DIAGNOSIS — G629 Polyneuropathy, unspecified: Secondary | ICD-10-CM | POA: Diagnosis not present

## 2021-06-24 LAB — CMP (CANCER CENTER ONLY)
ALT: 16 U/L (ref 0–44)
AST: 16 U/L (ref 15–41)
Albumin: 4 g/dL (ref 3.5–5.0)
Alkaline Phosphatase: 72 U/L (ref 38–126)
Anion gap: 7 (ref 5–15)
BUN: 11 mg/dL (ref 8–23)
CO2: 22 mmol/L (ref 22–32)
Calcium: 9.2 mg/dL (ref 8.9–10.3)
Chloride: 110 mmol/L (ref 98–111)
Creatinine: 0.77 mg/dL (ref 0.44–1.00)
GFR, Estimated: >60 mL/min (ref >60)
Glucose, Bld: 106 mg/dL — ABNORMAL HIGH (ref 70–99)
Potassium: 3.7 mmol/L (ref 3.5–5.1)
Sodium: 139 mmol/L (ref 135–145)
Total Bilirubin: 0.3 mg/dL (ref 0.3–1.2)
Total Protein: 6 g/dL — ABNORMAL LOW (ref 6.5–8.1)

## 2021-06-24 LAB — CBC WITH DIFFERENTIAL/PLATELET
Abs Immature Granulocytes: 0.01 10*3/uL (ref 0.00–0.07)
Basophils Absolute: 0.1 10*3/uL (ref 0.0–0.1)
Basophils Relative: 2 %
Eosinophils Absolute: 0.2 10*3/uL (ref 0.0–0.5)
Eosinophils Relative: 6 %
HCT: 35.5 % — ABNORMAL LOW (ref 36.0–46.0)
Hemoglobin: 11.9 g/dL — ABNORMAL LOW (ref 12.0–15.0)
Immature Granulocytes: 0 %
Lymphocytes Relative: 52 %
Lymphs Abs: 1.4 10*3/uL (ref 0.7–4.0)
MCH: 32.6 pg (ref 26.0–34.0)
MCHC: 33.5 g/dL (ref 30.0–36.0)
MCV: 97.3 fL (ref 80.0–100.0)
Monocytes Absolute: 0.4 10*3/uL (ref 0.1–1.0)
Monocytes Relative: 13 %
Neutro Abs: 0.7 10*3/uL — ABNORMAL LOW (ref 1.7–7.7)
Neutrophils Relative %: 27 %
Platelets: 179 10*3/uL (ref 150–400)
RBC: 3.65 MIL/uL — ABNORMAL LOW (ref 3.87–5.11)
RDW: 15.1 % (ref 11.5–15.5)
WBC: 2.7 10*3/uL — ABNORMAL LOW (ref 4.0–10.5)
nRBC: 0 % (ref 0.0–0.2)

## 2021-06-24 MED ORDER — SODIUM CHLORIDE 0.9 % IV SOLN
Freq: Once | INTRAVENOUS | Status: AC
Start: 2021-06-24 — End: 2021-06-24

## 2021-06-24 MED ORDER — SODIUM CHLORIDE 0.9 % IV SOLN
16.0000 mg/kg | Freq: Once | INTRAVENOUS | Status: AC
  Administered 2021-06-24: 12:00:00 1100 mg via INTRAVENOUS
  Filled 2021-06-24: qty 40

## 2021-06-24 MED ORDER — ACETAMINOPHEN 325 MG PO TABS
650.0000 mg | ORAL_TABLET | Freq: Once | ORAL | Status: AC
  Administered 2021-06-24: 11:00:00 650 mg via ORAL
  Filled 2021-06-24: qty 2

## 2021-06-24 MED ORDER — SODIUM CHLORIDE 0.9 % IV SOLN
20.0000 mg | Freq: Once | INTRAVENOUS | Status: AC
  Administered 2021-06-24: 11:00:00 20 mg via INTRAVENOUS
  Filled 2021-06-24: qty 20

## 2021-06-24 MED ORDER — FAMOTIDINE 20 MG IN NS 100 ML IVPB
20.0000 mg | Freq: Once | INTRAVENOUS | Status: AC
  Administered 2021-06-24: 11:00:00 20 mg via INTRAVENOUS
  Filled 2021-06-24: qty 100

## 2021-06-24 MED ORDER — DIPHENHYDRAMINE HCL 25 MG PO CAPS
50.0000 mg | ORAL_CAPSULE | Freq: Once | ORAL | Status: AC
  Administered 2021-06-24: 11:00:00 50 mg via ORAL
  Filled 2021-06-24: qty 2

## 2021-06-24 NOTE — Patient Instructions (Signed)
[facility]COL[facility]  Discharge Instructions: °Thank you for choosing [facility] to provide your oncology and hematology care.  ° °If you have a lab appointment with the Cancer Center, please go directly to the Cancer Center and check in at the registration area. °  °Wear comfortable clothing and clothing appropriate for easy access to any Portacath or PICC line.  ° °We strive to give you quality time with your provider. You may need to reschedule your appointment if you arrive late (15 or more minutes).  Arriving late affects you and other patients whose appointments are after yours.  Also, if you miss three or more appointments without notifying the office, you may be dismissed from the clinic at the provider’s discretion.    °  °For prescription refill requests, have your pharmacy contact our office and allow 72 hours for refills to be completed.   ° °Today you received the following chemotherapy and/or immunotherapy agents: Darzalex  °  °To help prevent nausea and vomiting after your treatment, we encourage you to take your nausea medication as directed. ° °BELOW ARE SYMPTOMS THAT SHOULD BE REPORTED IMMEDIATELY: °*FEVER GREATER THAN 100.4 F (38 °C) OR HIGHER °*CHILLS OR SWEATING °*NAUSEA AND VOMITING THAT IS NOT CONTROLLED WITH YOUR NAUSEA MEDICATION °*UNUSUAL SHORTNESS OF BREATH °*UNUSUAL BRUISING OR BLEEDING °*URINARY PROBLEMS (pain or burning when urinating, or frequent urination) °*BOWEL PROBLEMS (unusual diarrhea, constipation, pain near the anus) °TENDERNESS IN MOUTH AND THROAT WITH OR WITHOUT PRESENCE OF ULCERS (sore throat, sores in mouth, or a toothache) °UNUSUAL RASH, SWELLING OR PAIN  °UNUSUAL VAGINAL DISCHARGE OR ITCHING  ° °Items with * indicate a potential emergency and should be followed up as soon as possible or go to the Emergency Department if any problems should occur. ° °Please show the CHEMOTHERAPY ALERT CARD or IMMUNOTHERAPY ALERT CARD at check-in to the  Emergency Department and triage nurse. ° °Should you have questions after your visit or need to cancel or reschedule your appointment, please contact [facility]  Dept: [phone]  and follow the prompts.  Office hours are 8:00 a.m. to 4:30 p.m. Monday - Friday. Please note that voicemails left after 4:00 p.m. may not be returned until the following business day.  We are closed weekends and major holidays. You have access to a nurse at all times for urgent questions. Please call the main number to the clinic Dept: [phone] and follow the prompts. ° ° °For any non-urgent questions, you may also contact your provider using MyChart. We now offer e-Visits for anyone 18 and older to request care online for non-urgent symptoms. For details visit [email or web address]. °  °Also download the MyChart app! Go to the app store, search "MyChart", open the app, select [facility], and log in with your MyChart username and password. ° °Due to Covid, a mask is required upon entering the hospital/clinic. If you do not have a mask, one will be given to you upon arrival. For doctor visits, patients may have 1 support person aged 18 or older with them. For treatment visits, patients cannot have anyone with them due to current Covid guidelines and our immunocompromised population.  ° °

## 2021-06-24 NOTE — Progress Notes (Signed)
Per Dr. Lorenso Courier OK to trt w/ ANC 0.7 today

## 2021-06-25 LAB — KAPPA/LAMBDA LIGHT CHAINS
Kappa free light chain: 7.1 mg/L (ref 3.3–19.4)
Kappa, lambda light chain ratio: 1.25 (ref 0.26–1.65)
Lambda free light chains: 5.7 mg/L (ref 5.7–26.3)

## 2021-06-27 ENCOUNTER — Encounter: Payer: Self-pay | Admitting: Pharmacist

## 2021-06-27 ENCOUNTER — Other Ambulatory Visit: Payer: Self-pay | Admitting: Hematology

## 2021-06-27 ENCOUNTER — Other Ambulatory Visit: Payer: Self-pay

## 2021-06-27 DIAGNOSIS — C9000 Multiple myeloma not having achieved remission: Secondary | ICD-10-CM

## 2021-06-27 DIAGNOSIS — Z7189 Other specified counseling: Secondary | ICD-10-CM

## 2021-06-27 LAB — MULTIPLE MYELOMA PANEL, SERUM
Albumin SerPl Elph-Mcnc: 3.7 g/dL (ref 2.9–4.4)
Albumin/Glob SerPl: 1.9 — ABNORMAL HIGH (ref 0.7–1.7)
Alpha 1: 0.2 g/dL (ref 0.0–0.4)
Alpha2 Glob SerPl Elph-Mcnc: 0.7 g/dL (ref 0.4–1.0)
B-Globulin SerPl Elph-Mcnc: 0.7 g/dL (ref 0.7–1.3)
Gamma Glob SerPl Elph-Mcnc: 0.4 g/dL (ref 0.4–1.8)
Globulin, Total: 2 g/dL — ABNORMAL LOW (ref 2.2–3.9)
IgA: 13 mg/dL — ABNORMAL LOW (ref 64–422)
IgG (Immunoglobin G), Serum: 398 mg/dL — ABNORMAL LOW (ref 586–1602)
IgM (Immunoglobulin M), Srm: 14 mg/dL — ABNORMAL LOW (ref 26–217)
M Protein SerPl Elph-Mcnc: 0.1 g/dL — ABNORMAL HIGH
Total Protein ELP: 5.7 g/dL — ABNORMAL LOW (ref 6.0–8.5)

## 2021-06-27 MED ORDER — DEXAMETHASONE 4 MG PO TABS: ORAL_TABLET | ORAL | 5 refills | Status: DC

## 2021-07-02 ENCOUNTER — Telehealth: Payer: Self-pay | Admitting: Pharmacist

## 2021-07-02 NOTE — Telephone Encounter (Signed)
Oral Chemotherapy Pharmacist Encounter   Attempted to reach patient to discuss specifics for applying for manufacturer assistance for Revlimid (lenalidomide)  Patient is currently out of grant funding for multiple myeloma, so we will proceed with applying for manufacturer assistance through [employer] at this time.   No answer. Left voicemail for patient to call back to discuss details of manufacturer assistance process.   Leron Croak, PharmD, BCPS Hematology/Oncology Clinical Pharmacist [facility] [phone] 07/02/2021 11:03 AM

## 2021-07-02 NOTE — Telephone Encounter (Signed)
Oral Oncology Pharmacist Encounter  Met with patient while in clinic today to complete application for [employer] Patient Assistance Program in an effort to reduce the patient's out of pocket expense for Revlimid to $0.  Application completed and faxed to: [phone]   [employer] patient assistance phone number for follow up is: [phone]  This encounter will be updated until final determination.   Leron Croak, PharmD, BCPS Hematology/Oncology Clinical Pharmacist [facility] [phone] 07/02/2021 12:56 PM

## 2021-07-03 ENCOUNTER — Telehealth: Payer: Self-pay | Admitting: Pharmacist

## 2021-07-03 ENCOUNTER — Other Ambulatory Visit (HOSPITAL_COMMUNITY): Payer: Self-pay

## 2021-07-03 NOTE — Telephone Encounter (Signed)
Oral Oncology Pharmacist Encounter   Was successful in securing patient a $ 13,000 grant from [employer] ([employer]) to provide copayment coverage for Revlimid. This will keep the out of pocket expense at $0.     I will call the patient to let them know of the approval.   The billing information is as follows and has been shared with [facility] for processing.   Member ID: [id number] Group ID: [id number] RxBin: [id number] PCN: [id number] Dates of Eligibility: 07/03/21 through 07/03/22   Fund: Myeloma  Leron Croak, PharmD, BCPS Hematology/Oncology Clinical Pharmacist [facility] [place] [phone] 07/03/2021 1:47 PM

## 2021-07-03 NOTE — Telephone Encounter (Signed)
Oral Chemotherapy Pharmacist Encounter   Tracy Fisher was obtained for patient in lieu of proceeding with manufacturer assistance through [employer] at this time. Grant obtained for $13,000 through [employer] was shared with [facility]. Request made for patient's Revlimid to be expedited and shipped to patient ASAP.  Called and LVM for patient to call back to discuss medication acquisition from [employer].  Leron Croak, PharmD, BCPS Hematology/Oncology Clinical Pharmacist [facility] [place] [phone] 07/03/2021 3:33 PM

## 2021-07-04 ENCOUNTER — Other Ambulatory Visit (HOSPITAL_COMMUNITY): Payer: Self-pay

## 2021-07-04 MED FILL — Dexamethasone Sodium Phosphate Inj 100 MG/10ML: INTRAMUSCULAR | Qty: 2 | Status: AC

## 2021-07-07 ENCOUNTER — Ambulatory Visit: Payer: Medicare HMO

## 2021-07-07 ENCOUNTER — Other Ambulatory Visit: Payer: Self-pay

## 2021-07-07 ENCOUNTER — Inpatient Hospital Stay: Payer: Medicare HMO

## 2021-07-07 ENCOUNTER — Other Ambulatory Visit: Payer: Medicare HMO

## 2021-07-07 ENCOUNTER — Inpatient Hospital Stay: Payer: Medicare HMO | Admitting: Hematology

## 2021-07-07 ENCOUNTER — Inpatient Hospital Stay: Payer: Medicare HMO | Attending: Hematology

## 2021-07-07 VITALS — BP 132/70 | HR 86 | Temp 97.9°F | Resp 18 | Wt 154.5 lb

## 2021-07-07 VITALS — BP 124/57 | HR 88 | Temp 98.2°F | Resp 20

## 2021-07-07 DIAGNOSIS — Z7189 Other specified counseling: Secondary | ICD-10-CM

## 2021-07-07 DIAGNOSIS — Z7982 Long term (current) use of aspirin: Secondary | ICD-10-CM | POA: Insufficient documentation

## 2021-07-07 DIAGNOSIS — Z7952 Long term (current) use of systemic steroids: Secondary | ICD-10-CM | POA: Insufficient documentation

## 2021-07-07 DIAGNOSIS — C9000 Multiple myeloma not having achieved remission: Secondary | ICD-10-CM | POA: Insufficient documentation

## 2021-07-07 DIAGNOSIS — E119 Type 2 diabetes mellitus without complications: Secondary | ICD-10-CM | POA: Diagnosis not present

## 2021-07-07 DIAGNOSIS — Z5112 Encounter for antineoplastic immunotherapy: Secondary | ICD-10-CM | POA: Diagnosis not present

## 2021-07-07 DIAGNOSIS — Z5111 Encounter for antineoplastic chemotherapy: Secondary | ICD-10-CM

## 2021-07-07 DIAGNOSIS — I10 Essential (primary) hypertension: Secondary | ICD-10-CM | POA: Insufficient documentation

## 2021-07-07 DIAGNOSIS — D72819 Decreased white blood cell count, unspecified: Secondary | ICD-10-CM | POA: Diagnosis not present

## 2021-07-07 DIAGNOSIS — D539 Nutritional anemia, unspecified: Secondary | ICD-10-CM | POA: Insufficient documentation

## 2021-07-07 DIAGNOSIS — Z79899 Other long term (current) drug therapy: Secondary | ICD-10-CM | POA: Insufficient documentation

## 2021-07-07 LAB — CBC WITH DIFFERENTIAL/PLATELET
Abs Immature Granulocytes: 0 10*3/uL (ref 0.00–0.07)
Basophils Absolute: 0.1 10*3/uL (ref 0.0–0.1)
Basophils Relative: 3 %
Eosinophils Absolute: 0.1 10*3/uL (ref 0.0–0.5)
Eosinophils Relative: 3 %
HCT: 35.5 % — ABNORMAL LOW (ref 36.0–46.0)
Hemoglobin: 12.1 g/dL (ref 12.0–15.0)
Immature Granulocytes: 0 %
Lymphocytes Relative: 44 %
Lymphs Abs: 1.5 10*3/uL (ref 0.7–4.0)
MCH: 32.7 pg (ref 26.0–34.0)
MCHC: 34.1 g/dL (ref 30.0–36.0)
MCV: 95.9 fL (ref 80.0–100.0)
Monocytes Absolute: 0.4 10*3/uL (ref 0.1–1.0)
Monocytes Relative: 12 %
Neutro Abs: 1.3 10*3/uL — ABNORMAL LOW (ref 1.7–7.7)
Neutrophils Relative %: 38 %
Platelets: 167 10*3/uL (ref 150–400)
RBC: 3.7 MIL/uL — ABNORMAL LOW (ref 3.87–5.11)
RDW: 15.3 % (ref 11.5–15.5)
WBC: 3.4 10*3/uL — ABNORMAL LOW (ref 4.0–10.5)
nRBC: 0 % (ref 0.0–0.2)

## 2021-07-07 LAB — CMP (CANCER CENTER ONLY)
ALT: 18 U/L (ref 0–44)
AST: 19 U/L (ref 15–41)
Albumin: 4.1 g/dL (ref 3.5–5.0)
Alkaline Phosphatase: 77 U/L (ref 38–126)
Anion gap: 6 (ref 5–15)
BUN: 13 mg/dL (ref 8–23)
CO2: 23 mmol/L (ref 22–32)
Calcium: 9.1 mg/dL (ref 8.9–10.3)
Chloride: 109 mmol/L (ref 98–111)
Creatinine: 0.76 mg/dL (ref 0.44–1.00)
GFR, Estimated: >60 mL/min (ref >60)
Glucose, Bld: 98 mg/dL (ref 70–99)
Potassium: 4.2 mmol/L (ref 3.5–5.1)
Sodium: 138 mmol/L (ref 135–145)
Total Bilirubin: 0.3 mg/dL (ref 0.3–1.2)
Total Protein: 6.2 g/dL — ABNORMAL LOW (ref 6.5–8.1)

## 2021-07-07 MED ORDER — DIPHENHYDRAMINE HCL 25 MG PO CAPS
50.0000 mg | ORAL_CAPSULE | Freq: Once | ORAL | Status: AC
  Administered 2021-07-07: 50 mg via ORAL
  Filled 2021-07-07: qty 2

## 2021-07-07 MED ORDER — FAMOTIDINE 20 MG IN NS 100 ML IVPB
20.0000 mg | Freq: Once | INTRAVENOUS | Status: AC
  Administered 2021-07-07: 20 mg via INTRAVENOUS
  Filled 2021-07-07: qty 100

## 2021-07-07 MED ORDER — SODIUM CHLORIDE 0.9 % IV SOLN
16.0000 mg/kg | Freq: Once | INTRAVENOUS | Status: AC
  Administered 2021-07-07: 1100 mg via INTRAVENOUS
  Filled 2021-07-07: qty 40

## 2021-07-07 MED ORDER — SODIUM CHLORIDE 0.9 % IV SOLN
20.0000 mg | Freq: Once | INTRAVENOUS | Status: AC
  Administered 2021-07-07: 20 mg via INTRAVENOUS
  Filled 2021-07-07: qty 20

## 2021-07-07 MED ORDER — SODIUM CHLORIDE 0.9 % IV SOLN: Freq: Once | INTRAVENOUS | Status: AC

## 2021-07-07 MED ORDER — ACETAMINOPHEN 325 MG PO TABS
650.0000 mg | ORAL_TABLET | Freq: Once | ORAL | Status: AC
  Administered 2021-07-07: 650 mg via ORAL
  Filled 2021-07-07: qty 2

## 2021-07-07 NOTE — Patient Instructions (Signed)
[facility]  Discharge Instructions: °Thank you for choosing [facility] to provide your oncology and hematology care.  ° °If you have a lab appointment with the Cancer Center, please go directly to the Cancer Center and check in at the registration area. °  °Wear comfortable clothing and clothing appropriate for easy access to any Portacath or PICC line.  ° °We strive to give you quality time with your provider. You may need to reschedule your appointment if you arrive late (15 or more minutes).  Arriving late affects you and other patients whose appointments are after yours.  Also, if you miss three or more appointments without notifying the office, you may be dismissed from the clinic at the provider’s discretion.    °  °For prescription refill requests, have your pharmacy contact our office and allow 72 hours for refills to be completed.   ° °Today you received the following chemotherapy and/or immunotherapy agents: Darzalex  °  °To help prevent nausea and vomiting after your treatment, we encourage you to take your nausea medication as directed. ° °BELOW ARE SYMPTOMS THAT SHOULD BE REPORTED IMMEDIATELY: °*FEVER GREATER THAN 100.4 F (38 °C) OR HIGHER °*CHILLS OR SWEATING °*NAUSEA AND VOMITING THAT IS NOT CONTROLLED WITH YOUR NAUSEA MEDICATION °*UNUSUAL SHORTNESS OF BREATH °*UNUSUAL BRUISING OR BLEEDING °*URINARY PROBLEMS (pain or burning when urinating, or frequent urination) °*BOWEL PROBLEMS (unusual diarrhea, constipation, pain near the anus) °TENDERNESS IN MOUTH AND THROAT WITH OR WITHOUT PRESENCE OF ULCERS (sore throat, sores in mouth, or a toothache) °UNUSUAL RASH, SWELLING OR PAIN  °UNUSUAL VAGINAL DISCHARGE OR ITCHING  ° °Items with * indicate a potential emergency and should be followed up as soon as possible or go to the Emergency Department if any problems should occur. ° °Please show the CHEMOTHERAPY ALERT CARD or IMMUNOTHERAPY ALERT CARD at check-in to the  Emergency Department and triage nurse. ° °Should you have questions after your visit or need to cancel or reschedule your appointment, please contact [facility]  Dept: [phone]  and follow the prompts.  Office hours are 8:00 a.m. to 4:30 p.m. Monday - Friday. Please note that voicemails left after 4:00 p.m. may not be returned until the following business day.  We are closed weekends and major holidays. You have access to a nurse at all times for urgent questions. Please call the main number to the clinic Dept: [phone] and follow the prompts. ° ° °For any non-urgent questions, you may also contact your provider using MyChart. We now offer e-Visits for anyone 18 and older to request care online for non-urgent symptoms. For details visit [email or web address]. °  °Also download the MyChart app! Go to the app store, search "MyChart", open the app, select [facility], and log in with your MyChart username and password. ° °Due to Covid, a mask is required upon entering the hospital/clinic. If you do not have a mask, one will be given to you upon arrival. For doctor visits, patients may have 1 support person aged 18 or older with them. For treatment visits, patients cannot have anyone with them due to current Covid guidelines and our immunocompromised population.  ° °

## 2021-07-07 NOTE — Progress Notes (Signed)
[place] FOLLOW-UP progress notes  DOS .07/07/2021  Patient Care Team: Harlan Stains, MD as PCP - General (Family Medicine) Brunetta Genera, MD as Consulting Physician (Hematology)  CHIEF COMPLAINTS/PURPOSE OF VISIT:  Follow-up for continued evaluation and management of her multiple myeloma  HISTORY OF PRESENTING ILLNESS: Please see previous note  Tracy Fisher is here for follow-up prior to cycle 6-day 15 of daratumumab/Revlimid/dexamethasone treatment. She notes that she has been doing well overall and had a good Christmas and new year. Mild cough with some increased secretions but no fevers no shortness of breath no chest pain. Her last myeloma labs showed M protein of 0.1 g/dL IgG kappa which could just be from her daratumumab.  We discussed that she has a high likelihood of being in remission and we we will get CT bone marrow aspiration and biopsy and PET CT scan to evaluate response to induction treatment and plan on transitioning to maintenance treatment if all looks well.  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  She has been taking her Cymbalta and gabapentin and infrequently using Norco.  No other acute infectious issues. No other prohibitive toxicities from treatment.  Oncology History  Smoldering multiple myeloma (SMM)  Multiple myeloma ([place])  08/28/2020 Initial Diagnosis   Multiple myeloma ([place])   09/09/2020 - 10/07/2020 Chemotherapy          01/20/2021 -  Chemotherapy   Patient is on Treatment Plan : MYELOMA NEWLY DIAGNOSED TRANSPLANT CANDIDATE DaraVRd (Daratumumab IV) q21d x 6 Cycles (Induction/Consolidation)       MEDICAL HISTORY:  Past Medical History:  Diagnosis Date   Bone cancer ([place]) 2018   started chemo pill in  2018   Diabetes mellitus without complication ([place])    Hypertension    Vocal cord cyst     SURGICAL HISTORY: No past surgical history on file.  SOCIAL HISTORY: Social History    Socioeconomic History   Marital status: Divorced    Spouse name: Not on file   Number of children: Not on file   Years of education: Not on file   Highest education level: Not on file  Occupational History   Not on file  Tobacco Use   Smoking status: Never   Smokeless tobacco: Never  Vaping Use   Vaping Use: Never used  Substance and Sexual Activity   Alcohol use: No   Drug use: Never   Sexual activity: Not on file  Other Topics Concern   Not on file  Social History Narrative   Not on file   Social Determinants of Health   Financial Resource Strain: Not on file  Food Insecurity: Not on file  Transportation Needs: Not on file  Physical Activity: Not on file  Stress: Not on file  Social Connections: Not on file  Intimate Partner Violence: Not on file    FAMILY HISTORY: Family History  Problem Relation Age of Onset   Breast cancer Neg Hx     ALLERGIES:  is allergic to bortezomib and sulfa antibiotics.  MEDICATIONS:  Current Outpatient Medications  Medication Sig Dispense Refill   acyclovir (ZOVIRAX) 400 MG tablet Take 1 tablet (400 mg total) by mouth 2 (two) times daily. 60 tablet 3   amLODipine (NORVASC) 5 MG tablet      aspirin EC 81 MG tablet Take 81 mg by mouth daily. Swallow whole.     atorvastatin (LIPITOR) 20 MG tablet      b complex vitamins  capsule Take 1 capsule by mouth daily. 30 capsule 11   brimonidine (ALPHAGAN) 0.2 % ophthalmic solution Place 1 drop into both eyes 2 (two) times daily.   3   Calcium Carbonate-Vitamin D (CALCIUM + D PO) Take 1 tablet by mouth daily.     dexamethasone (DECADRON) 4 MG tablet Take 5 tablets the day after daratumumab for cycle 5 & 6-- also take the day after day 8 Velcade and take daily for 2 days on non-chemo weeks (days 15 and 16). 20 tablet 5   dorzolamide-timolol (COSOPT) 22.3-6.8 MG/ML ophthalmic solution Place 1 drop into both eyes 2 (two) times daily.   12   DULoxetine (CYMBALTA) 60 MG capsule Take 1 capsule (60  mg total) by mouth daily. 30 capsule 2   gabapentin (NEURONTIN) 300 MG capsule Take 1 capsule (300 mg total) by mouth 3 (three) times daily. 90 capsule 0   HYDROcodone-acetaminophen (NORCO) 5-325 MG tablet Take 1 tablet by mouth every 6 (six) hours as needed for moderate pain. 60 tablet 0   lenalidomide (REVLIMID) 25 MG capsule Take 1 capsule (25 mg total) by mouth daily. Take 21 days on, 7 days off, repeat every 28 days. 21 capsule 3   LUMIGAN 0.01 % SOLN INSTILL 1 DROP INTO EACH EYE ONCE DAILY     metoCLOPramide (REGLAN) 10 MG tablet Take 1 tablet (10 mg total) by mouth every 8 (eight) hours as needed for nausea or vomiting. (Patient not taking: No sig reported) 6 tablet 0   mirtazapine (REMERON) 15 MG tablet Take 15 mg by mouth at bedtime.     Multiple Vitamin (MULTIVITAMIN) capsule Take by mouth.     ondansetron (ZOFRAN) 8 MG tablet Take 1 tablet (8 mg total) by mouth 2 (two) times daily as needed (Nausea or vomiting). 30 tablet 1   ONETOUCH VERIO test strip USE STRIP TO CHECK GLUCOSE TWICE DAILY     Potassium Chloride ER 20 MEQ TBCR Take 1 tablet by mouth twice daily 60 tablet 0   No current facility-administered medications for this visit.    REVIEW OF SYSTEMS:   .10 Point review of Systems was done is negative except as noted above.    PHYSICAL EXAMINATION: ECOG PERFORMANCE STATUS: 1 - Symptomatic but completely ambulatory  Vitals:   07/07/21 0955  BP: 132/70  Pulse: 86  Resp: 18  Temp: 97.9 F (36.6 C)  SpO2: 97%   Filed Weights   07/07/21 0955  Weight: 154 lb 8 oz (70.1 kg)  . GENERAL:alert, in no acute distress and comfortable SKIN: no acute rashes, no significant lesions EYES: conjunctiva are pink and non-injected, sclera anicteric OROPHARYNX: MMM, no exudates, no oropharyngeal erythema or ulceration NECK: supple, no JVD LYMPH:  no palpable lymphadenopathy in the cervical, axillary or inguinal regions LUNGS: clear to auscultation b/l with normal respiratory  effort HEART: regular rate & rhythm ABDOMEN:  normoactive bowel sounds , non tender, not distended. Extremity: no pedal edema PSYCH: alert & oriented x 3 with fluent speech NEURO: no focal motor/sensory deficits   LABORATORY DATA:   CBC Latest Ref Rng & Units 07/07/2021 06/24/2021 06/09/2021  WBC 4.0 - 10.5 K/uL 3.4(L) 2.7(L) 3.0(L)  Hemoglobin 12.0 - 15.0 g/dL 12.1 11.9(L) 10.9(L)  Hematocrit 36.0 - 46.0 % 35.5(L) 35.5(L) 33.2(L)  Platelets 150 - 400 K/uL 167 179 174   . CMP Latest Ref Rng & Units 06/24/2021 06/09/2021 05/26/2021  Glucose 70 - 99 mg/dL 106(H) 94 117(H)  BUN 8 - 23 mg/dL 11  13 10  Creatinine 0.44 - 1.00 mg/dL 0.77 0.71 0.78  Sodium 135 - 145 mmol/L 139 141 143  Potassium 3.5 - 5.1 mmol/L 3.7 3.8 3.7  Chloride 98 - 111 mmol/L 110 111 112(H)  CO2 22 - 32 mmol/L 22 21(L) 21(L)  Calcium 8.9 - 10.3 mg/dL 9.2 8.8(L) 9.2  Total Protein 6.5 - 8.1 g/dL 6.0(L) 6.0(L) 6.1(L)  Total Bilirubin 0.3 - 1.2 mg/dL 0.3 0.3 0.4  Alkaline Phos 38 - 126 U/L 72 73 81  AST 15 - 41 U/L _0 ALT 0 - 44 U/L _1 RADIOGRAPHIC STUDIES: I have personally reviewed the radiological images as listed and agreed with the findings in the report. No results found.  ASSESSMENT & PLAN:   76 y.o. female here for follow-up of her multiple myeloma and next treatment   #1 IgG kappa multiple myeloma.   Initially presented as smoldering myeloma and had M spike of 1.9 g/dL which was noted to be IgG kappa on immunofixation electrophoresis. -Bone survey shows no overt evidence of osseous involvement suggestive of multiple myeloma. Beta 2 microglobulin, sedimentation rate an LDH levels are within normal limits. -Bone marrow biopsy shows previously showed 17% monoclonal plasma cells Cytogenetics/MolCy - trisomy 24 (consistent with myeloma)   -Bone study from 09/27/17 showed some osteopenic changes without osteoporosis. Previous bone studies are not yet available  -09/08/2019 FISH Panel  revealed "No Mutations Detected". -09/08/2019 Cytogenetics revealed "Normal female karyotype". -09/08/2019 BM Bx Report ([id number])  revealed "BONE MARROW, ASPIRATE, CLOT, CORE: -Hypercellular bone marrow for age with plasma cell neoplasm PERIPHERAL BLOOD: -Macrocytic anemia -Leukopenia." - 20% Plasma Cells  -09/15/2019 PET/CT ([id number]) revealed "1. No findings of active myeloma or other hypermetabolic malignancy. 2.  Aortic Atherosclerosis (ICD10-I70.0). 3. Photopenic hypodense hepatic lesions most compatible with cysts."   A treatment initiation active Multiple myeloma Bone Marrow Biopsy with nearly 60% plasma cells. With M spike of 3.6g/dl Starting to develop mild anemia (does not meet anemia criteria yet) No hypercalcemia, renal insufficiency  PET/CT 07/31/2020 - No abnormal hypermetabolism in the neck, chest, abdomen or pelvis.   #2  Chronic leukopenia with intermittent neutropenia. -B12 and folate levels within normal limits. -Reasonable to take a daily vitamin B complex tablet. -No indication for G-CSF at this time. -Now also additionally possibly due to Revlimid.   2.  Mild anemia and leukopenia likely related to myeloma and Revlimid. PLAN: -Patient's lab results were discussed with her in detail detail.  CBC with hemoglobin of 12.1 WBC count of 3.4 platelets of 167k with mild neutropenia ANC 1.3k -Pending CMP -No prohibitive toxicities from daratumumab Revlimid dexamethasone treatment at this time -We will proceed to treatment with daratumumab Revlimid dexamethasone if CMP stable -We will get PET CT scan in 3 to 5 days -CT guided bone marrow aspiration biopsy in 3 to 5 days Return to clinic with-Dr. Irene Limbo with labs in 3 weeks  -Dara specific IFE on repeat labs. -If patient noted to have VG PR or CR we will plan to transition to maintenance treatment with Revlimid.  3.  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 3 times daily -Discussed using supplement Palmitoylethanolamine OTC at about 400 to 800 mg p.o. daily to help with her neuropathy. -Continue vitamin B complex daily -Continue current dose of Cymbalta    FOLLOW UP: PET CT scan in 5 to 7 days CT bone marrow  biopsy in 5 to 7 days  Return to clinic with Dr. Irene Limbo with labs in 3 weeks  All questions were answered. The patient knows to call the clinic with any problems, questions or concerns.  . The total time spent in the appointment was 30 minutes on reviewing lab results with the patient, ordering and management of chemotherapy, discussion of goals of care and treatment plan, ordering and coordinating PET/CT and bone marrow biopsy and documentation .   Sullivan Lone MD MS Hematology/Oncology Physician [facility]

## 2021-07-07 NOTE — Progress Notes (Signed)
Bone marrow biopsy and PET scan dates and times with instructions given to pt. Pt acknowledged and verbalized understanding.

## 2021-07-07 NOTE — Progress Notes (Signed)
Per Irene Limbo MD, ok to treat with ANC 1.3

## 2021-07-08 ENCOUNTER — Telehealth: Payer: Self-pay | Admitting: Hematology

## 2021-07-08 LAB — KAPPA/LAMBDA LIGHT CHAINS
Kappa free light chain: 7.2 mg/L (ref 3.3–19.4)
Kappa, lambda light chain ratio: 1.89 — ABNORMAL HIGH (ref 0.26–1.65)
Lambda free light chains: 3.8 mg/L — ABNORMAL LOW (ref 5.7–26.3)

## 2021-07-08 NOTE — Telephone Encounter (Signed)
Left message with follow-up appointment per 1/9 los.

## 2021-07-09 MED ORDER — AZITHROMYCIN 250 MG PO TABS: ORAL_TABLET | ORAL | 0 refills | Status: DC

## 2021-07-09 NOTE — Addendum Note (Signed)
Addended by: Sullivan Lone on: 07/09/2021 03:35 PM   Modules accepted: Orders

## 2021-07-10 DIAGNOSIS — E1169 Type 2 diabetes mellitus with other specified complication: Secondary | ICD-10-CM | POA: Diagnosis not present

## 2021-07-10 DIAGNOSIS — I10 Essential (primary) hypertension: Secondary | ICD-10-CM | POA: Diagnosis not present

## 2021-07-10 DIAGNOSIS — E559 Vitamin D deficiency, unspecified: Secondary | ICD-10-CM | POA: Diagnosis not present

## 2021-07-10 DIAGNOSIS — D709 Neutropenia, unspecified: Secondary | ICD-10-CM | POA: Diagnosis not present

## 2021-07-10 DIAGNOSIS — E785 Hyperlipidemia, unspecified: Secondary | ICD-10-CM | POA: Diagnosis not present

## 2021-07-10 DIAGNOSIS — Z0000 Encounter for general adult medical examination without abnormal findings: Secondary | ICD-10-CM | POA: Diagnosis not present

## 2021-07-10 DIAGNOSIS — G620 Drug-induced polyneuropathy: Secondary | ICD-10-CM | POA: Diagnosis not present

## 2021-07-10 DIAGNOSIS — C9001 Multiple myeloma in remission: Secondary | ICD-10-CM | POA: Diagnosis not present

## 2021-07-10 DIAGNOSIS — M8588 Other specified disorders of bone density and structure, other site: Secondary | ICD-10-CM | POA: Diagnosis not present

## 2021-07-10 DIAGNOSIS — R69 Illness, unspecified: Secondary | ICD-10-CM | POA: Diagnosis not present

## 2021-07-12 LAB — MULTIPLE MYELOMA PANEL, SERUM
Albumin SerPl Elph-Mcnc: 3.4 g/dL (ref 2.9–4.4)
Albumin/Glob SerPl: 1.4 (ref 0.7–1.7)
Alpha 1: 0.3 g/dL (ref 0.0–0.4)
Alpha2 Glob SerPl Elph-Mcnc: 0.8 g/dL (ref 0.4–1.0)
B-Globulin SerPl Elph-Mcnc: 0.9 g/dL (ref 0.7–1.3)
Gamma Glob SerPl Elph-Mcnc: 0.6 g/dL (ref 0.4–1.8)
Globulin, Total: 2.6 g/dL (ref 2.2–3.9)
IgA: 12 mg/dL — ABNORMAL LOW (ref 64–422)
IgG (Immunoglobin G), Serum: 422 mg/dL — ABNORMAL LOW (ref 586–1602)
IgM (Immunoglobulin M), Srm: 13 mg/dL — ABNORMAL LOW (ref 26–217)
M Protein SerPl Elph-Mcnc: 0.1 g/dL — ABNORMAL HIGH
Total Protein ELP: 6 g/dL (ref 6.0–8.5)

## 2021-07-16 ENCOUNTER — Other Ambulatory Visit: Payer: Self-pay | Admitting: Radiology

## 2021-07-16 ENCOUNTER — Ambulatory Visit (HOSPITAL_COMMUNITY)
Admission: RE | Admit: 2021-07-16 | Discharge: 2021-07-16 | Disposition: A | Discharge status: Home / Self Care | Payer: Medicare HMO | Source: Ambulatory Visit | Attending: Hematology | Admitting: Hematology

## 2021-07-16 ENCOUNTER — Other Ambulatory Visit: Payer: Self-pay

## 2021-07-16 DIAGNOSIS — Z5111 Encounter for antineoplastic chemotherapy: Secondary | ICD-10-CM | POA: Insufficient documentation

## 2021-07-16 DIAGNOSIS — C9000 Multiple myeloma not having achieved remission: Secondary | ICD-10-CM | POA: Insufficient documentation

## 2021-07-16 LAB — GLUCOSE, CAPILLARY: Glucose-Capillary: 88 mg/dL (ref 70–99)

## 2021-07-16 MED ORDER — FLUDEOXYGLUCOSE F - 18 (FDG) INJECTION
7.7000 | Freq: Once | INTRAVENOUS | Status: AC | PRN
  Administered 2021-07-16: 7.53 via INTRAVENOUS

## 2021-07-17 ENCOUNTER — Other Ambulatory Visit: Payer: Self-pay | Admitting: Radiology

## 2021-07-17 ENCOUNTER — Other Ambulatory Visit (HOSPITAL_COMMUNITY): Payer: Self-pay | Admitting: Physician Assistant

## 2021-07-17 ENCOUNTER — Other Ambulatory Visit (HOSPITAL_COMMUNITY): Payer: Self-pay

## 2021-07-18 ENCOUNTER — Encounter (HOSPITAL_COMMUNITY): Payer: Self-pay

## 2021-07-18 ENCOUNTER — Ambulatory Visit (HOSPITAL_COMMUNITY)
Admission: RE | Admit: 2021-07-18 | Discharge: 2021-07-18 | Disposition: A | Discharge status: Home / Self Care | Payer: Medicare HMO | Source: Ambulatory Visit | Attending: Hematology | Admitting: Hematology

## 2021-07-18 ENCOUNTER — Other Ambulatory Visit: Payer: Self-pay

## 2021-07-18 DIAGNOSIS — C9000 Multiple myeloma not having achieved remission: Secondary | ICD-10-CM | POA: Diagnosis not present

## 2021-07-18 DIAGNOSIS — D72819 Decreased white blood cell count, unspecified: Secondary | ICD-10-CM | POA: Diagnosis not present

## 2021-07-18 DIAGNOSIS — Z5111 Encounter for antineoplastic chemotherapy: Secondary | ICD-10-CM | POA: Insufficient documentation

## 2021-07-18 DIAGNOSIS — D696 Thrombocytopenia, unspecified: Secondary | ICD-10-CM | POA: Diagnosis not present

## 2021-07-18 DIAGNOSIS — Z5181 Encounter for therapeutic drug level monitoring: Secondary | ICD-10-CM | POA: Diagnosis not present

## 2021-07-18 DIAGNOSIS — Z7961 Long term (current) use of immunomodulator: Secondary | ICD-10-CM | POA: Insufficient documentation

## 2021-07-18 LAB — CBC WITH DIFFERENTIAL/PLATELET
Abs Immature Granulocytes: 0.01 10*3/uL (ref 0.00–0.07)
Basophils Absolute: 0 10*3/uL (ref 0.0–0.1)
Basophils Relative: 1 %
Eosinophils Absolute: 0.2 10*3/uL (ref 0.0–0.5)
Eosinophils Relative: 6 %
HCT: 37.1 % (ref 36.0–46.0)
Hemoglobin: 12.1 g/dL (ref 12.0–15.0)
Immature Granulocytes: 0 %
Lymphocytes Relative: 29 %
Lymphs Abs: 1 10*3/uL (ref 0.7–4.0)
MCH: 32.4 pg (ref 26.0–34.0)
MCHC: 32.6 g/dL (ref 30.0–36.0)
MCV: 99.5 fL (ref 80.0–100.0)
Monocytes Absolute: 0.5 10*3/uL (ref 0.1–1.0)
Monocytes Relative: 16 %
Neutro Abs: 1.7 10*3/uL (ref 1.7–7.7)
Neutrophils Relative %: 48 %
Platelets: 149 10*3/uL — ABNORMAL LOW (ref 150–400)
RBC: 3.73 MIL/uL — ABNORMAL LOW (ref 3.87–5.11)
RDW: 15.2 % (ref 11.5–15.5)
WBC: 3.4 10*3/uL — ABNORMAL LOW (ref 4.0–10.5)
nRBC: 0 % (ref 0.0–0.2)

## 2021-07-18 LAB — GLUCOSE, CAPILLARY: Glucose-Capillary: 98 mg/dL (ref 70–99)

## 2021-07-18 MED ORDER — MIDAZOLAM HCL 2 MG/2ML IJ SOLN
INTRAMUSCULAR | Status: AC | PRN
  Administered 2021-07-18 (×2): 1 mg via INTRAVENOUS

## 2021-07-18 MED ORDER — MIDAZOLAM HCL 2 MG/2ML IJ SOLN
INTRAMUSCULAR | Status: AC
  Filled 2021-07-18: qty 4

## 2021-07-18 MED ORDER — FENTANYL CITRATE (PF) 100 MCG/2ML IJ SOLN
INTRAMUSCULAR | Status: AC | PRN
  Administered 2021-07-18 (×2): 50 ug via INTRAVENOUS

## 2021-07-18 MED ORDER — FENTANYL CITRATE (PF) 100 MCG/2ML IJ SOLN
INTRAMUSCULAR | Status: AC
  Filled 2021-07-18: qty 2

## 2021-07-18 MED ORDER — LIDOCAINE HCL (PF) 1 % IJ SOLN
INTRAMUSCULAR | Status: AC | PRN
  Administered 2021-07-18: 20 mL

## 2021-07-18 MED ORDER — SODIUM CHLORIDE 0.9 % IV SOLN: INTRAVENOUS | Status: DC

## 2021-07-18 NOTE — Procedures (Signed)
Interventional Radiology Procedure Note  Procedure: CT guided aspirate and core biopsy of right iliac bone  Complications: None  Recommendations: - Bedrest supine x 1 hrs - Hydrocodone PRN  Pain - Follow biopsy results   Hollan Philipp, MD   

## 2021-07-18 NOTE — H&P (Signed)
Referring Physician(s): Brunetta Genera  Supervising Physician: Ruthann Cancer  Patient Status:  Tracy Fisher  Chief Complaint: "I'm here for a bone marrow biopsy"   Subjective: Patient familiar to IR service from bone marrow biopsies in 2018, 2021, and 2022. She has a history of multiple myeloma and presents again today for bone marrow biopsy to assess treatment response.  She currently denies fever, headache, chest pain, dyspnea, cough, abdominal/back pain, nausea, vomiting or bleeding.  Past Medical History:  Diagnosis Date   Bone cancer ([place]) 2018   started chemo pill in  2018   Diabetes mellitus without complication ([place])    Hypertension    Vocal cord cyst    History reviewed. No pertinent surgical history.    Allergies: Bortezomib and Sulfa antibiotics  Medications: Prior to Admission medications   Medication Sig Start Date End Date Taking? Authorizing Provider  amLODipine (NORVASC) 5 MG tablet  02/01/15  Yes [provider]  aspirin EC 81 MG tablet Take 81 mg by mouth daily. Swallow whole.   Yes [provider]  atorvastatin (LIPITOR) 20 MG tablet  11/25/16  Yes [provider]  b complex vitamins capsule Take 1 capsule by mouth daily. 10/10/20  Yes Brunetta Genera, MD  brimonidine (ALPHAGAN) 0.2 % ophthalmic solution Place 1 drop into both eyes 2 (two) times daily.  01/30/18  Yes [provider]  Calcium Carbonate-Vitamin D (CALCIUM + D PO) Take 1 tablet by mouth daily.   Yes [provider]  dexamethasone (DECADRON) 4 MG tablet Take 5 tablets the day after daratumumab for cycle 5 & 6-- also take the day after day 8 Velcade and take daily for 2 days on non-chemo weeks (days 15 and 16). 06/27/21  Yes Brunetta Genera, MD  dorzolamide-timolol (COSOPT) 22.3-6.8 MG/ML ophthalmic solution Place 1 drop into both eyes 2 (two) times daily.  11/21/16  Yes [provider]  DULoxetine (CYMBALTA) 60 MG capsule Take 1  capsule (60 mg total) by mouth daily. 05/29/21  Yes Brunetta Genera, MD  gabapentin (NEURONTIN) 300 MG capsule Take 1 capsule (300 mg total) by mouth 3 (three) times daily. 05/02/21  Yes Brunetta Genera, MD  HYDROcodone-acetaminophen (NORCO) 5-325 MG tablet Take 1 tablet by mouth every 6 (six) hours as needed for moderate pain. 06/19/21  Yes Orson Slick, MD  lenalidomide (REVLIMID) 25 MG capsule Take 1 capsule (25 mg total) by mouth daily. Take 21 days on, 7 days off, repeat every 28 days. 06/18/21  Yes Brunetta Genera, MD  LUMIGAN 0.01 % SOLN INSTILL 1 DROP INTO EACH EYE ONCE DAILY 07/17/20  Yes [provider]  mirtazapine (REMERON) 15 MG tablet Take 15 mg by mouth at bedtime.   Yes [provider]  Multiple Vitamin (MULTIVITAMIN) capsule Take by mouth.   Yes [provider]  ONETOUCH VERIO test strip USE STRIP TO CHECK GLUCOSE TWICE DAILY 11/07/20  Yes [provider]  Potassium Chloride ER 20 MEQ TBCR Take 1 tablet by mouth twice daily 06/27/21  Yes Brunetta Genera, MD  acyclovir (ZOVIRAX) 400 MG tablet Take 1 tablet (400 mg total) by mouth 2 (two) times daily. 06/18/21   Brunetta Genera, MD  azithromycin (ZITHROMAX) 250 MG tablet 2 tabs (516m) on day 1 and then 1 tab (2533m daily x 4 days 07/09/21   KaBrunetta GeneraMD  metoCLOPramide (REGLAN) 10 MG tablet Take 1 tablet (10 mg total) by mouth every 8 (eight) hours  as needed for nausea or vomiting. Patient not taking: No sig reported 06/04/18   Orlie Dakin, MD  ondansetron (ZOFRAN) 8 MG tablet Take 1 tablet (8 mg total) by mouth 2 (two) times daily as needed (Nausea or vomiting). 01/08/21   Brunetta Genera, MD  prochlorperazine (COMPAZINE) 10 MG tablet Take 1 tablet (10 mg total) by mouth every 6 (six) hours as needed (Nausea or vomiting). 08/28/20 01/08/21  Brunetta Genera, MD     Vital Signs: BP 136/75    Pulse 66    Temp 98.4 F (36.9 C) (Oral)    Resp 19    SpO2  98%   Physical Exam awake, alert.  Chest clear to auscultation bilaterally.  Heart with regular rate and rhythm.  Abdomen soft, positive bowel sounds, nontender.  No lower extremity edema.  Imaging: NM PET Image Restage (PS) Whole Body  Result Date: 07/17/2021 CLINICAL DATA:  Subsequent treatment strategy for multiple myeloma. EXAM: NUCLEAR MEDICINE PET WHOLE BODY TECHNIQUE: 7.53 mCi F-18 FDG was injected intravenously. Full-ring PET imaging was performed from the head to foot after the radiotracer. CT data was obtained and used for attenuation correction and anatomic localization. Fasting blood glucose: 88 mg/dl COMPARISON:  Multiple priors including most recent PET-CT July 31, 2020 FINDINGS: Mediastinal blood pool activity: SUV max 2.64 HEAD/NECK: No hypermetabolic activity in the scalp. No hypermetabolic cervical lymph nodes. Incidental CT findings: Dental hardware. No discrete thyroid nodule. CHEST: No hypermetabolic mediastinal or hilar nodes. No suspicious pulmonary nodules on the CT scan. Incidental CT findings: Atherosclerotic calcifications of the aorta, aortic valve and coronary arteries. Cardiac enlargement. Hypoventilatory change in the lung bases. No pleural effusion or pneumothorax. ABDOMEN/PELVIS: No abnormal hypermetabolic activity within the liver, pancreas, or spleen. No hypermetabolic lymph nodes in the abdomen or pelvis. Incidental CT findings: Bilobar hepatic cysts. No hydronephrosis. Aortic atherosclerosis without abdominal aortic aneurysm. SKELETON: No focal hypermetabolic activity to suggest skeletal metastasis. Incidental CT findings: No aggressive lytic or blastic lesion of bone. Multilevel degenerative changes spine. EXTREMITIES: No abnormal hypermetabolic activity in the lower extremities to suggest neoplastic disease. Misregistration artifact in the lower extremities related to patient motion. Hypermetabolic activity about the bilateral shoulder joints, left hip and knees is  favored inflammatory Incidental CT findings: none IMPRESSION: No abnormal hypermetabolism to suggest active myeloma or other hypermetabolic malignancy. Electronically Signed   By: Dahlia Bailiff M.D.   On: 07/17/2021 12:04    Labs:  CBC: Recent Labs    06/09/21 0948 06/24/21 0847 07/07/21 0950 07/18/21 0730  WBC 3.0* 2.7* 3.4* 3.4*  HGB 10.9* 11.9* 12.1 12.1  HCT 33.2* 35.5* 35.5* 37.1  PLT 174 179 167 149*    COAGS: No results for input(s): INR, APTT in the last 8760 hours.  BMP: Recent Labs    05/26/21 0816 06/09/21 0948 06/24/21 0847 07/07/21 0950  NA 143 141 139 138  K 3.7 3.8 3.7 4.2  CL 112* 111 110 109  CO2 21* 21* 22 23  GLUCOSE 117* 94 106* 98  BUN '10 13 11 13  ' CALCIUM 9.2 8.8* 9.2 9.1  CREATININE 0.78 0.71 0.77 0.76  GFRNONAA >60 >60 >60 >60    LIVER FUNCTION TESTS: Recent Labs    05/26/21 0816 06/09/21 0948 06/24/21 0847 07/07/21 0950  BILITOT 0.4 0.3 0.3 0.3  AST '16 18 16 19  ' ALT '19 17 16 18  ' ALKPHOS 81 73 72 77  PROT 6.1* 6.0* 6.0* 6.2*  ALBUMIN 3.5 3.8 4.0 4.1  Assessment and Plan: Patient familiar to IR service from bone marrow biopsies in 2018, 2021, and 2022. She has a history of multiple myeloma and presents again today for bone marrow biopsy to assess treatment response.Risks and benefits of procedure was discussed with the patient  including, but not limited to bleeding, infection, damage to adjacent structures or low yield requiring additional tests.  All of the questions were answered and there is agreement to proceed.  Consent signed and in chart.    Electronically Signed: D. Rowe Robert, PA-C 07/18/2021, 8:12 AM   I spent a total of 20 minutes at the the patient's bedside AND on the patient's hospital floor or unit, greater than 50% of which was counseling/coordinating care for CT-guided bone marrow biopsy

## 2021-07-18 NOTE — Discharge Instructions (Signed)
Please call Interventional Radiology clinic [phone] with any questions or concerns. ° °You may remove your dressing and shower tomorrow. ° ° °Bone Marrow Aspiration and Bone Marrow Biopsy, Adult, Care After °This sheet gives you information about how to care for yourself after your procedure. Your health care provider may also give you more specific instructions. If you have problems or questions, contact your health care provider. °What can I expect after the procedure? °After the procedure, it is common to have: °Mild pain and tenderness. °Swelling. °Bruising. °Follow these instructions at home: °Puncture site care °Follow instructions from your health care provider about how to take care of the puncture site. Make sure you: °Wash your hands with soap and water before and after you change your bandage (dressing). If soap and water are not available, use hand sanitizer. °Change your dressing as told by your health care provider. °Check your puncture site every day for signs of infection. Check for: °More redness, swelling, or pain. °Fluid or blood. °Warmth. °Pus or a bad smell.   °Activity °Return to your normal activities as told by your health care provider. Ask your health care provider what activities are safe for you. °Do not lift anything that is heavier than 10 lb (4.5 kg), or the limit that you are told, until your health care provider says that it is safe. °Do not drive for 24 hours if you were given a sedative during your procedure. °General instructions °Take over-the-counter and prescription medicines only as told by your health care provider. °Do not take baths, swim, or use a hot tub until your health care provider approves. Ask your health care provider if you may take showers. You may only be allowed to take sponge baths. °If directed, put ice on the affected area. To do this: °Put ice in a plastic bag. °Place a towel between your skin and the bag. °Leave the ice on for 20 minutes, 2-3 times a  day. °Keep all follow-up visits as told by your health care provider. This is important.   °Contact a health care provider if: °Your pain is not controlled with medicine. °You have a fever. °You have more redness, swelling, or pain around the puncture site. °You have fluid or blood coming from the puncture site. °Your puncture site feels warm to the touch. °You have pus or a bad smell coming from the puncture site. °Summary °After the procedure, it is common to have mild pain, tenderness, swelling, and bruising. °Follow instructions from your health care provider about how to take care of the puncture site and what activities are safe for you. °Take over-the-counter and prescription medicines only as told by your health care provider. °Contact a health care provider if you have any signs of infection, such as fluid or blood coming from the puncture site. °This information is not intended to replace advice given to you by your health care provider. Make sure you discuss any questions you have with your health care provider. °Document Revised: 11/01/2018 Document Reviewed: 11/01/2018 °Elsevier Patient Education © 2021 [employer]. ° ° °Moderate Conscious Sedation, Adult, Care After °This sheet gives you information about how to care for yourself after your procedure. Your health care provider may also give you more specific instructions. If you have problems or questions, contact your health care provider. °What can I expect after the procedure? °After the procedure, it is common to have: °Sleepiness for several hours. °Impaired judgment for several hours. °Difficulty with balance. °Vomiting if you eat too   soon. °Follow these instructions at home: °For the time period you were told by your health care provider: °Rest. °Do not participate in activities where you could fall or become injured. °Do not drive or use machinery. °Do not drink alcohol. °Do not take sleeping pills or medicines that cause drowsiness. °Do not  make important decisions or sign legal documents. °Do not take care of children on your own.  °  °  °Eating and drinking °Follow the diet recommended by your health care provider. °Drink enough fluid to keep your urine pale yellow. °If you vomit: °Drink water, juice, or soup when you can drink without vomiting. °Make sure you have little or no nausea before eating solid foods.   °General instructions °Take over-the-counter and prescription medicines only as told by your health care provider. °Have a responsible adult stay with you for the time you are told. It is important to have someone help care for you until you are awake and alert. °Do not smoke. °Keep all follow-up visits as told by your health care provider. This is important. °Contact a health care provider if: °You are still sleepy or having trouble with balance after 24 hours. °You feel light-headed. °You keep feeling nauseous or you keep vomiting. °You develop a rash. °You have a fever. °You have redness or swelling around the IV site. °Get help right away if: °You have trouble breathing. °You have new-onset confusion at home. °Summary °After the procedure, it is common to feel sleepy, have impaired judgment, or feel nauseous if you eat too soon. °Rest after you get home. Know the things you should not do after the procedure. °Follow the diet recommended by your health care provider and drink enough fluid to keep your urine pale yellow. °Get help right away if you have trouble breathing or new-onset confusion at home. °This information is not intended to replace advice given to you by your health care provider. Make sure you discuss any questions you have with your health care provider. °Document Revised: 10/13/2019 Document Reviewed: 05/11/2019 °Elsevier Patient Education © 2021 Elsevier Inc.  °

## 2021-07-22 ENCOUNTER — Other Ambulatory Visit: Payer: Self-pay | Admitting: Family Medicine

## 2021-07-22 DIAGNOSIS — M8580 Other specified disorders of bone density and structure, unspecified site: Secondary | ICD-10-CM

## 2021-07-24 ENCOUNTER — Encounter (HOSPITAL_COMMUNITY): Payer: Self-pay | Admitting: Hematology

## 2021-07-25 ENCOUNTER — Other Ambulatory Visit: Payer: Self-pay

## 2021-07-25 DIAGNOSIS — C9000 Multiple myeloma not having achieved remission: Secondary | ICD-10-CM

## 2021-07-25 MED ORDER — LENALIDOMIDE 25 MG PO CAPS: 25.0000 mg | ORAL_CAPSULE | Freq: Every day | ORAL | 3 refills | Status: DC

## 2021-07-25 NOTE — Progress Notes (Signed)
Called patient to advise that her Revlimid auth had been sent to [facility] by [employer].

## 2021-07-28 ENCOUNTER — Other Ambulatory Visit: Payer: Self-pay

## 2021-07-28 ENCOUNTER — Encounter (HOSPITAL_COMMUNITY): Payer: Self-pay | Admitting: Hematology

## 2021-07-28 ENCOUNTER — Inpatient Hospital Stay: Payer: Medicare HMO | Admitting: Physician Assistant

## 2021-07-28 ENCOUNTER — Inpatient Hospital Stay: Payer: Medicare HMO

## 2021-07-28 VITALS — BP 136/77 | HR 78 | Temp 97.7°F | Resp 16 | Wt 156.0 lb

## 2021-07-28 DIAGNOSIS — C9000 Multiple myeloma not having achieved remission: Secondary | ICD-10-CM | POA: Diagnosis not present

## 2021-07-28 DIAGNOSIS — Z5111 Encounter for antineoplastic chemotherapy: Secondary | ICD-10-CM

## 2021-07-28 DIAGNOSIS — Z5112 Encounter for antineoplastic immunotherapy: Secondary | ICD-10-CM | POA: Diagnosis not present

## 2021-07-28 DIAGNOSIS — E119 Type 2 diabetes mellitus without complications: Secondary | ICD-10-CM | POA: Diagnosis not present

## 2021-07-28 DIAGNOSIS — Z79899 Other long term (current) drug therapy: Secondary | ICD-10-CM | POA: Diagnosis not present

## 2021-07-28 DIAGNOSIS — Z7982 Long term (current) use of aspirin: Secondary | ICD-10-CM | POA: Diagnosis not present

## 2021-07-28 DIAGNOSIS — D539 Nutritional anemia, unspecified: Secondary | ICD-10-CM | POA: Diagnosis not present

## 2021-07-28 DIAGNOSIS — D72819 Decreased white blood cell count, unspecified: Secondary | ICD-10-CM | POA: Diagnosis not present

## 2021-07-28 DIAGNOSIS — C9001 Multiple myeloma in remission: Secondary | ICD-10-CM | POA: Diagnosis not present

## 2021-07-28 DIAGNOSIS — Z7952 Long term (current) use of systemic steroids: Secondary | ICD-10-CM | POA: Diagnosis not present

## 2021-07-28 DIAGNOSIS — I10 Essential (primary) hypertension: Secondary | ICD-10-CM | POA: Diagnosis not present

## 2021-07-28 LAB — CMP (CANCER CENTER ONLY)
ALT: 20 U/L (ref 0–44)
AST: 18 U/L (ref 15–41)
Albumin: 4.2 g/dL (ref 3.5–5.0)
Alkaline Phosphatase: 71 U/L (ref 38–126)
Anion gap: 7 (ref 5–15)
BUN: 11 mg/dL (ref 8–23)
CO2: 25 mmol/L (ref 22–32)
Calcium: 9.3 mg/dL (ref 8.9–10.3)
Chloride: 109 mmol/L (ref 98–111)
Creatinine: 0.76 mg/dL (ref 0.44–1.00)
GFR, Estimated: >60 mL/min
Glucose, Bld: 110 mg/dL — ABNORMAL HIGH (ref 70–99)
Potassium: 3.9 mmol/L (ref 3.5–5.1)
Sodium: 141 mmol/L (ref 135–145)
Total Bilirubin: 0.5 mg/dL (ref 0.3–1.2)
Total Protein: 6.3 g/dL — ABNORMAL LOW (ref 6.5–8.1)

## 2021-07-28 LAB — CBC WITH DIFFERENTIAL/PLATELET
Abs Immature Granulocytes: 0.01 K/uL (ref 0.00–0.07)
Basophils Absolute: 0 K/uL (ref 0.0–0.1)
Basophils Relative: 2 %
Eosinophils Absolute: 0.1 K/uL (ref 0.0–0.5)
Eosinophils Relative: 5 %
HCT: 35.8 % — ABNORMAL LOW (ref 36.0–46.0)
Hemoglobin: 12.1 g/dL (ref 12.0–15.0)
Immature Granulocytes: 0 %
Lymphocytes Relative: 47 %
Lymphs Abs: 1.1 K/uL (ref 0.7–4.0)
MCH: 32.5 pg (ref 26.0–34.0)
MCHC: 33.8 g/dL (ref 30.0–36.0)
MCV: 96.2 fL (ref 80.0–100.0)
Monocytes Absolute: 0.3 K/uL (ref 0.1–1.0)
Monocytes Relative: 13 %
Neutro Abs: 0.8 K/uL — ABNORMAL LOW (ref 1.7–7.7)
Neutrophils Relative %: 33 %
Platelets: 192 K/uL (ref 150–400)
RBC: 3.72 MIL/uL — ABNORMAL LOW (ref 3.87–5.11)
RDW: 15.3 % (ref 11.5–15.5)
WBC: 2.4 K/uL — ABNORMAL LOW (ref 4.0–10.5)
nRBC: 0 % (ref 0.0–0.2)

## 2021-07-28 MED ORDER — LENALIDOMIDE 10 MG PO CAPS: 10.0000 mg | ORAL_CAPSULE | Freq: Every day | ORAL | 2 refills | Status: DC

## 2021-07-28 NOTE — Progress Notes (Addendum)
[place] progress notes  DOS .07/28/2021  Patient Care Team: Harlan Stains, MD as PCP - General (Family Medicine) Brunetta Genera, MD as Consulting Physician (Hematology)  CHIEF COMPLAINTS/PURPOSE OF VISIT:  Follow-up for continued evaluation and management of her multiple myeloma  HISTORY OF PRESENTING ILLNESS: Please see previous note  Madison Darrelyn Morro returns for a follow up for management of multiple myeloma. Her last visit was with Dr. Jovani Colquhoun Limbo on 07/07/2021. In the interim, she denies any changes to her health.   On exam today, Ms. Kristensen reports that her energy levels are unchanged.  She continues to be very active and complete all her daily activities on her own.  She has a good appetite and denies any weight changes.  She denies any nausea, vomiting or abdominal pain.  Patient reports intermittent episodes of left low back pain after heavy exertion.  She takes Cymbalta and gabapentin as prescribed.  Her neuropathy is stable and does not interfere with her grip or balance.  She denies any bowel habit changes including diarrhea or constipation.  Patient denies easy bruising or signs of active bleeding.  Patient reports nasal congestion clear sputum.  Her primary care doctor has prescribed her nasal spray and she plans to start over-the-counter antihistamine. She denies any fevers, chills, night sweats, shortness of breath, chest pain or cough.  She has no other complaints.  Rest of the 10 point ROS is negative.  MEDICAL HISTORY:  Past Medical History:  Diagnosis Date   Bone cancer ([place]) 2018   started chemo pill in  2018   Diabetes mellitus without complication ([place])    Hypertension    Vocal cord cyst     SURGICAL HISTORY: No past surgical history on file.  SOCIAL HISTORY: Social History   Socioeconomic History   Marital status: Divorced    Spouse name: Not on file   Number of children: Not on file   Years of education: Not  on file   Highest education level: Not on file  Occupational History   Not on file  Tobacco Use   Smoking status: Never   Smokeless tobacco: Never  Vaping Use   Vaping Use: Never used  Substance and Sexual Activity   Alcohol use: No   Drug use: Never   Sexual activity: Not on file  Other Topics Concern   Not on file  Social History Narrative   Not on file   Social Determinants of Health   Financial Resource Strain: Not on file  Food Insecurity: Not on file  Transportation Needs: Not on file  Physical Activity: Not on file  Stress: Not on file  Social Connections: Not on file  Intimate Partner Violence: Not on file    FAMILY HISTORY: Family History  Problem Relation Age of Onset   Breast cancer Neg Hx     ALLERGIES:  is allergic to bortezomib and sulfa antibiotics.  MEDICATIONS:  Current Outpatient Medications  Medication Sig Dispense Refill   acyclovir (ZOVIRAX) 400 MG tablet Take 1 tablet (400 mg total) by mouth 2 (two) times daily. 60 tablet 3   amLODipine (NORVASC) 5 MG tablet      aspirin EC 81 MG tablet Take 81 mg by mouth daily. Swallow whole.     atorvastatin (LIPITOR) 20 MG tablet      azithromycin (ZITHROMAX) 250 MG tablet 2 tabs (578m) on day 1 and then 1 tab (2551m daily x 4 days 6 each 0   b  complex vitamins capsule Take 1 capsule by mouth daily. 30 capsule 11   brimonidine (ALPHAGAN) 0.2 % ophthalmic solution Place 1 drop into both eyes 2 (two) times daily.   3   Calcium Carbonate-Vitamin D (CALCIUM + D PO) Take 1 tablet by mouth daily.     dorzolamide-timolol (COSOPT) 22.3-6.8 MG/ML ophthalmic solution Place 1 drop into both eyes 2 (two) times daily.   12   DULoxetine (CYMBALTA) 60 MG capsule Take 1 capsule (60 mg total) by mouth daily. 30 capsule 2   gabapentin (NEURONTIN) 300 MG capsule Take 1 capsule (300 mg total) by mouth 3 (three) times daily. 90 capsule 0   HYDROcodone-acetaminophen (NORCO) 5-325 MG tablet Take 1 tablet by mouth every 6 (six)  hours as needed for moderate pain. 60 tablet 0   lenalidomide (REVLIMID) 10 MG capsule Take 1 capsule (10 mg total) by mouth daily. Take 21 days on, 7 days off, repeat every 28 days. 21 capsule 2   LUMIGAN 0.01 % SOLN INSTILL 1 DROP INTO EACH EYE ONCE DAILY     metoCLOPramide (REGLAN) 10 MG tablet Take 1 tablet (10 mg total) by mouth every 8 (eight) hours as needed for nausea or vomiting. (Patient not taking: No sig reported) 6 tablet 0   mirtazapine (REMERON) 15 MG tablet Take 15 mg by mouth at bedtime.     Multiple Vitamin (MULTIVITAMIN) capsule Take by mouth.     ondansetron (ZOFRAN) 8 MG tablet Take 1 tablet (8 mg total) by mouth 2 (two) times daily as needed (Nausea or vomiting). 30 tablet 1   ONETOUCH VERIO test strip USE STRIP TO CHECK GLUCOSE TWICE DAILY     Potassium Chloride ER 20 MEQ TBCR Take 1 tablet by mouth twice daily 60 tablet 0   No current facility-administered medications for this visit.    REVIEW OF SYSTEMS:   10 Point review of Systems was done is negative except as noted above.  PHYSICAL EXAMINATION: ECOG PERFORMANCE STATUS: 1 - Symptomatic but completely ambulatory  Vitals:   07/28/21 0930  BP: 136/77  Pulse: 78  Resp: 16  Temp: 97.7 F (36.5 C)  SpO2: 99%   Filed Weights   07/28/21 0930  Weight: 156 lb (70.8 kg)  . GENERAL:alert, in no acute distress and comfortable SKIN: no acute rashes, no significant lesions EYES: conjunctiva are pink and non-injected, sclera anicteric OROPHARYNX: MMM, no exudates, no oropharyngeal erythema or ulceration NECK: supple, no JVD LYMPH:  no palpable lymphadenopathy in the cervical or inguinal regions LUNGS: clear to auscultation b/l with normal respiratory effort HEART: regular rate & rhythm ABDOMEN:  normoactive bowel sounds , non tender, not distended. Extremity: no pedal edema PSYCH: alert & oriented x 3 with fluent speech NEURO: no focal motor/sensory deficits   LABORATORY DATA:   CBC Latest Ref Rng & Units  07/28/2021 07/18/2021 07/07/2021  WBC 4.0 - 10.5 K/uL 2.4(L) 3.4(L) 3.4(L)  Hemoglobin 12.0 - 15.0 g/dL 12.1 12.1 12.1  Hematocrit 36.0 - 46.0 % 35.8(L) 37.1 35.5(L)  Platelets 150 - 400 K/uL 192 149(L) 167   . CMP Latest Ref Rng & Units 07/28/2021 07/07/2021 06/24/2021  Glucose 70 - 99 mg/dL 110(H) 98 106(H)  BUN 8 - 23 mg/dL '11 13 11  ' Creatinine 0.44 - 1.00 mg/dL 0.76 0.76 0.77  Sodium 135 - 145 mmol/L 141 138 139  Potassium 3.5 - 5.1 mmol/L 3.9 4.2 3.7  Chloride 98 - 111 mmol/L 109 109 110  CO2 22 - 32 mmol/L 25 23  22  Calcium 8.9 - 10.3 mg/dL 9.3 9.1 9.2  Total Protein 6.5 - 8.1 g/dL 6.3(L) 6.2(L) 6.0(L)  Total Bilirubin 0.3 - 1.2 mg/dL 0.5 0.3 0.3  Alkaline Phos 38 - 126 U/L 71 77 72  AST 15 - 41 U/L '18 19 16  ' ALT 0 - 44 U/L '20 18 16    ' RADIOGRAPHIC STUDIES: I have personally reviewed the radiological images as listed and agreed with the findings in the report. NM PET Image Restage (PS) Whole Body  Result Date: 07/17/2021 CLINICAL DATA:  Subsequent treatment strategy for multiple myeloma. EXAM: NUCLEAR MEDICINE PET WHOLE BODY TECHNIQUE: 7.53 mCi F-18 FDG was injected intravenously. Full-ring PET imaging was performed from the head to foot after the radiotracer. CT data was obtained and used for attenuation correction and anatomic localization. Fasting blood glucose: 88 mg/dl COMPARISON:  Multiple priors including most recent PET-CT July 31, 2020 FINDINGS: Mediastinal blood pool activity: SUV max 2.64 HEAD/NECK: No hypermetabolic activity in the scalp. No hypermetabolic cervical lymph nodes. Incidental CT findings: Dental hardware. No discrete thyroid nodule. CHEST: No hypermetabolic mediastinal or hilar nodes. No suspicious pulmonary nodules on the CT scan. Incidental CT findings: Atherosclerotic calcifications of the aorta, aortic valve and coronary arteries. Cardiac enlargement. Hypoventilatory change in the lung bases. No pleural effusion or pneumothorax. ABDOMEN/PELVIS: No abnormal  hypermetabolic activity within the liver, pancreas, or spleen. No hypermetabolic lymph nodes in the abdomen or pelvis. Incidental CT findings: Bilobar hepatic cysts. No hydronephrosis. Aortic atherosclerosis without abdominal aortic aneurysm. SKELETON: No focal hypermetabolic activity to suggest skeletal metastasis. Incidental CT findings: No aggressive lytic or blastic lesion of bone. Multilevel degenerative changes spine. EXTREMITIES: No abnormal hypermetabolic activity in the lower extremities to suggest neoplastic disease. Misregistration artifact in the lower extremities related to patient motion. Hypermetabolic activity about the bilateral shoulder joints, left hip and knees is favored inflammatory Incidental CT findings: none IMPRESSION: No abnormal hypermetabolism to suggest active myeloma or other hypermetabolic malignancy. Electronically Signed   By: Dahlia Bailiff M.D.   On: 07/17/2021 12:04   CT Biopsy  Result Date: 07/18/2021 INDICATION: 76 year old female with history of multiple myeloma. Bone marrow biopsy to assess response to treatment. EXAM: CT-GUIDED BONE MARROW BIOPSY AND ASPIRATION MEDICATIONS: None ANESTHESIA/SEDATION: Fentanyl 100 mcg IV; Versed 2 mg IV Sedation Time: 10 minutes; The patient was continuously monitored during the procedure by the interventional radiology nurse under my direct supervision. COMPLICATIONS: None immediate. PROCEDURE: Informed consent was obtained from the patient following an explanation of the procedure, risks, benefits and alternatives. The patient understands, agrees and consents for the procedure. All questions were addressed. A time out was performed prior to the initiation of the procedure. The patient was positioned prone and non-contrast localization CT was performed of the pelvis to demonstrate the iliac marrow spaces. The operative site was prepped and draped in the usual sterile fashion. Under sterile conditions and local anesthesia, a 22 gauge  spinal needle was utilized for procedural planning. Next, an 11 gauge coaxial bone biopsy needle was advanced into the right iliac marrow space. Needle position was confirmed with CT imaging. Initially, a bone marrow aspiration was performed. Next, a bone marrow biopsy was obtained with the 11 gauge outer bone marrow device. Samples were prepared with the cytotechnologist and deemed adequate. The needle was removed and superficial hemostasis was obtained with manual compression. A dressing was applied. The patient tolerated the procedure well without immediate post procedural complication. IMPRESSION: Successful CT guided right iliac bone marrow aspiration and  core biopsy. Ruthann Cancer, MD [facility] Electronically Signed   By: Ruthann Cancer M.D.   On: 07/18/2021 12:33   CT BONE MARROW BIOPSY & ASPIRATION  Result Date: 07/18/2021 INDICATION: 76 year old female with history of multiple myeloma. Bone marrow biopsy to assess response to treatment. EXAM: CT-GUIDED BONE MARROW BIOPSY AND ASPIRATION MEDICATIONS: None ANESTHESIA/SEDATION: Fentanyl 100 mcg IV; Versed 2 mg IV Sedation Time: 10 minutes; The patient was continuously monitored during the procedure by the interventional radiology nurse under my direct supervision. COMPLICATIONS: None immediate. PROCEDURE: Informed consent was obtained from the patient following an explanation of the procedure, risks, benefits and alternatives. The patient understands, agrees and consents for the procedure. All questions were addressed. A time out was performed prior to the initiation of the procedure. The patient was positioned prone and non-contrast localization CT was performed of the pelvis to demonstrate the iliac marrow spaces. The operative site was prepped and draped in the usual sterile fashion. Under sterile conditions and local anesthesia, a 22 gauge spinal needle was utilized for procedural planning.  Next, an 11 gauge coaxial bone biopsy needle was advanced into the right iliac marrow space. Needle position was confirmed with CT imaging. Initially, a bone marrow aspiration was performed. Next, a bone marrow biopsy was obtained with the 11 gauge outer bone marrow device. Samples were prepared with the cytotechnologist and deemed adequate. The needle was removed and superficial hemostasis was obtained with manual compression. A dressing was applied. The patient tolerated the procedure well without immediate post procedural complication. IMPRESSION: Successful CT guided right iliac bone marrow aspiration and core biopsy. Ruthann Cancer, MD [facility] Electronically Signed   By: Ruthann Cancer M.D.   On: 07/18/2021 12:33    ASSESSMENT & PLAN:   76 y.o. female here for follow-up of her multiple myeloma and next treatment   #1 IgG kappa multiple myeloma. -Initially presented as smoldering myeloma and had M spike of 1.9 g/dL which was noted to be IgG kappa on immunofixation electrophoresis.  -Bone survey shows no overt evidence of osseous involvement suggestive of multiple myeloma. Beta 2 microglobulin, sedimentation rate an LDH levels are within normal limits.  -Bone marrow biopsy shows previously showed 17% monoclonal plasma cells Cytogenetics/MolCy - trisomy 71 (consistent with myeloma)  -Bone study from 09/27/17 showed some osteopenic changes without osteoporosis. Previous bone studies are not yet available   -09-12-19 FISH Panel revealed "No Mutations Detected".  -09-12-19 Cytogenetics revealed "Normal female karyotype".  -09/12/2019 BM Bx Report ([id number])  revealed "BONE MARROW, ASPIRATE, CLOT, CORE: -Hypercellular bone marrow for age with plasma cell neoplasm PERIPHERAL BLOOD: -Macrocytic anemia -Leukopenia." - 20% Plasma Cells   -09/15/2019 PET/CT ([id number]) revealed "1. No findings of active myeloma or other  hypermetabolic malignancy. 2.  Aortic Atherosclerosis (ICD10-I70.0). 3. Photopenic hypodense hepatic lesions most compatible with cysts."   -A treatment initiation active Multiple myeloma Bone Marrow Biopsy from 08/02/2020 with nearly 60% plasma cells. With M spike of 3.6g/dl -Starting to develop mild anemia (does not meet anemia criteria yet) No hypercalcemia, renal insufficiency  -PET/CT 07/31/2020 - No abnormal hypermetabolism in the neck, chest, abdomen or pelvis.  -Started C1D1 of [place] on 09/09/2020. Last treatment of C2D8 of VRD on 10/07/2020. Proceed with maintenance Revlimid 3 weeks on, 1 week off.   -To obtain deeper response, recommended to add daratumumab to patient's regimen. Started C1D1 of DaraRD on 01/20/2021. Last treatment of C6D15 of DaraRD on 07/07/2021.   -  Restaging PET scan from 07/16/2021 shows no abnormal hypermetabolism to suggest active myeloma or other hypermetabolic malignancy.  -Repeat bone marrow biopsy from 07/18/2021 shows no morphologic evidence of residual plasma cell myeloma.  - Plan to proceed with maintenance Revlimid 3 weeks on, 1 week off starting February 2023.    #2  Chronic leukopenia with intermittent neutropenia. -B12 and folate levels within normal limits. -Reasonable to take a daily vitamin B complex tablet. -No indication for G-CSF at this time. -Now also additionally possibly due to Revlimid.  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 3 times daily -Discussed using supplement Palmitoylethanolamine OTC at about 400 to 800 mg p.o. daily to help with her neuropathy. -Continue vitamin B complex daily -Continue current dose of Cymbalta  Disposition: --Labs today reviewed. CBC shows persistent neutropenia with ANC 800. No evidence of anemia or thrombocytopenia. CMP shows no evidence of renal dysfunction or hypercalcemia. MM Panel and sFLC pending today. --Discussed bone  marrow biopsy results and PET scan results, both show no evidence of active myeloma.  --Recommend to proceed with maintenance Revlimid 10 mg once daily, 3 weeks on and 1 week off.    Follow up: --RTC in 4 weeks with repeat labs.    I have spent a total of 30 minutes minutes of face-to-face and non-face-to-face time, preparing to see the patient, performing a medically appropriate examination, counseling and educating the patient, ordering tests, documenting clinical information in the electronic health record, and care coordination.   Lincoln Brigham, PA-C Dept of Hematology and [place] at [facility] Phone: [phone]  Patient was seen with Dr. Chryl Heck  .Patient was Personally and independently interviewed, examined and relevant elements of the history of present illness were reviewed in details and an assessment and plan was created. All elements of the patient's history of present illness , assessment and plan were discussed in details with Lincoln Brigham, PA-C. The above documentation reflects our combined findings assessment and plan.   Patient with no significant toxicities with her daratumumab/Revlimid/dexamethasone treatment.  PET/CT and bone marrow biopsy show her myeloma is in complete remission.  Labs, PET scan and bone marrow biopsy were reviewed with the patient in detail. We shall transition to maintenance Revlimid 10 mg 3 weeks on 1 week off. We discussed and patient is disinclined to consider high-dose chemotherapy with autologous hematopoietic stem cell transplant for consolidative treatment.  Sullivan Lone MD MS

## 2021-07-29 DIAGNOSIS — M1712 Unilateral primary osteoarthritis, left knee: Secondary | ICD-10-CM | POA: Diagnosis not present

## 2021-07-29 DIAGNOSIS — M25562 Pain in left knee: Secondary | ICD-10-CM | POA: Diagnosis not present

## 2021-07-29 LAB — KAPPA/LAMBDA LIGHT CHAINS
Kappa free light chain: 8.6 mg/L (ref 3.3–19.4)
Kappa, lambda light chain ratio: 1.43 (ref 0.26–1.65)
Lambda free light chains: 6 mg/L (ref 5.7–26.3)

## 2021-07-30 ENCOUNTER — Other Ambulatory Visit: Payer: Self-pay | Admitting: Hematology

## 2021-07-30 DIAGNOSIS — C9000 Multiple myeloma not having achieved remission: Secondary | ICD-10-CM

## 2021-07-30 LAB — MULTIPLE MYELOMA PANEL, SERUM
Albumin SerPl Elph-Mcnc: 3.6 g/dL (ref 2.9–4.4)
Albumin/Glob SerPl: 1.6 (ref 0.7–1.7)
Alpha 1: 0.3 g/dL (ref 0.0–0.4)
Alpha2 Glob SerPl Elph-Mcnc: 0.7 g/dL (ref 0.4–1.0)
B-Globulin SerPl Elph-Mcnc: 0.9 g/dL (ref 0.7–1.3)
Gamma Glob SerPl Elph-Mcnc: 0.4 g/dL (ref 0.4–1.8)
Globulin, Total: 2.3 g/dL (ref 2.2–3.9)
IgA: 14 mg/dL — ABNORMAL LOW (ref 64–422)
IgG (Immunoglobin G), Serum: 419 mg/dL — ABNORMAL LOW (ref 586–1602)
IgM (Immunoglobulin M), Srm: 12 mg/dL — ABNORMAL LOW (ref 26–217)
M Protein SerPl Elph-Mcnc: 0.1 g/dL — ABNORMAL HIGH
Total Protein ELP: 5.9 g/dL — ABNORMAL LOW (ref 6.0–8.5)

## 2021-08-05 ENCOUNTER — Emergency Department (HOSPITAL_COMMUNITY)
Admission: EM | Admit: 2021-08-05 | Discharge: 2021-08-05 | Disposition: A | Discharge status: Home / Self Care | Payer: Medicare HMO | Attending: Emergency Medicine | Admitting: Emergency Medicine

## 2021-08-05 ENCOUNTER — Emergency Department (HOSPITAL_COMMUNITY): Payer: Medicare HMO

## 2021-08-05 ENCOUNTER — Encounter (HOSPITAL_COMMUNITY): Payer: Self-pay

## 2021-08-05 ENCOUNTER — Other Ambulatory Visit: Payer: Self-pay

## 2021-08-05 DIAGNOSIS — R059 Cough, unspecified: Secondary | ICD-10-CM | POA: Diagnosis not present

## 2021-08-05 DIAGNOSIS — R0989 Other specified symptoms and signs involving the circulatory and respiratory systems: Secondary | ICD-10-CM | POA: Diagnosis not present

## 2021-08-05 DIAGNOSIS — Z7982 Long term (current) use of aspirin: Secondary | ICD-10-CM | POA: Insufficient documentation

## 2021-08-05 NOTE — Discharge Instructions (Signed)
As we discussed, there is no evidence of foreign body in your throat or lungs.  This is good news.  This is likely just irritation from the incident yesterday.  This will likely resolve on its own.  I would like you to schedule a follow-up appointment with your primary care doctor. Please return to th ED for any concerns you might have.

## 2021-08-05 NOTE — ED Notes (Signed)
I provided reinforced discharge education based off of discharge instructions. Pt acknowledged and understood my education. Pt had no further questions/concerns for provider/myself.  °

## 2021-08-05 NOTE — ED Triage Notes (Signed)
"  Had a small piece of ziplock bag in my soup last night and accidentally ate it. Had a hard time sleeping last night due to it irritating my esophagus and making me cough" per patient.  No shortness of breath, no difficulty swallowing.

## 2021-08-05 NOTE — ED Notes (Signed)
Pt has been talking on mobile phone since arrival. Pt making complete sentences without any difficulty breathing/talking.

## 2021-08-05 NOTE — ED Provider Notes (Addendum)
[name] Provider Note   CSN: [id number] Arrival date & time: 08/05/21  1054     History Chief Complaint  Patient presents with   Swallowed Foreign Body    Tracy Fisher is a 76 y.o. female who presents to the emergency department with a foreign body sensation in her throat started last night.  Patient states she was eating some soup accidentally swallowed some plastic wrapping about the size of the tip of her pinky.  After finishing her shift she then had a coughing spell and has had a foreign body sensation in the right side of her throat since then.  States she was up all night coughing.  No nausea or vomiting.  No fever or chills.   Swallowed Foreign Body      Home Medications Prior to Admission medications   Medication Sig Start Date End Date Taking? Authorizing Provider  acyclovir (ZOVIRAX) 400 MG tablet Take 1 tablet (400 mg total) by mouth 2 (two) times daily. 06/18/21   Tracy Genera, MD  amLODipine (NORVASC) 5 MG tablet  02/01/15   [provider]  aspirin EC 81 MG tablet Take 81 mg by mouth daily. Swallow whole.    [provider]  atorvastatin (LIPITOR) 20 MG tablet  11/25/16   [provider]  azithromycin (ZITHROMAX) 250 MG tablet 2 tabs (500mg ) on day 1 and then 1 tab (250mg ) daily x 4 days 07/09/21   Tracy Genera, MD  b complex vitamins capsule Take 1 capsule by mouth daily. 10/10/20   Tracy Genera, MD  brimonidine (ALPHAGAN) 0.2 % ophthalmic solution Place 1 drop into both eyes 2 (two) times daily.  01/30/18   [provider]  Calcium Carbonate-Vitamin D (CALCIUM + D PO) Take 1 tablet by mouth daily.    [provider]  dorzolamide-timolol (COSOPT) 22.3-6.8 MG/ML ophthalmic solution Place 1 drop into both eyes 2 (two) times daily.  11/21/16   [provider]  DULoxetine (CYMBALTA) 60 MG capsule Take 1 capsule (60 mg total) by mouth daily. 05/29/21   Tracy Genera, MD  gabapentin (NEURONTIN) 300 MG capsule TAKE 1 CAPSULE BY MOUTH THREE TIMES DAILY 07/30/21   Tracy Genera, MD  HYDROcodone-acetaminophen [facility]) 5-325 MG tablet Take 1 tablet by mouth every 6 (six) hours as needed for moderate pain. 06/19/21   Tracy Slick, MD  lenalidomide (REVLIMID) 10 MG capsule Take 1 capsule (10 mg total) by mouth daily. Take 21 days on, 7 days off, repeat every 28 days. 07/28/21   Tracy Genera, MD  LUMIGAN 0.01 % SOLN INSTILL 1 DROP INTO EACH EYE ONCE DAILY 07/17/20   [provider]  metoCLOPramide (REGLAN) 10 MG tablet Take 1 tablet (10 mg total) by mouth every 8 (eight) hours as needed for nausea or vomiting. Patient not taking: No sig reported 06/04/18   Orlie Dakin, MD  mirtazapine (REMERON) 15 MG tablet Take 15 mg by mouth at bedtime.    [provider]  Multiple Vitamin (MULTIVITAMIN) capsule Take by mouth.    [provider]  ondansetron (ZOFRAN) 8 MG tablet Take 1 tablet (8 mg total) by mouth 2 (two) times daily as needed (Nausea or vomiting). 01/08/21   Tracy Genera, MD  [facility] VERIO test strip USE STRIP TO CHECK GLUCOSE TWICE DAILY 11/07/20   [provider]  Potassium Chloride ER 20 MEQ TBCR Take 1 tablet by mouth twice daily 06/27/21   Tracy Fisher,  Tracy Spring, MD  prochlorperazine (COMPAZINE) 10 MG tablet Take 1 tablet (10 mg total) by mouth every 6 (six) hours as needed (Nausea or vomiting). 08/28/20 01/08/21  Tracy Genera, MD      Allergies    Bortezomib and Sulfa antibiotics    Review of Systems   Review of Systems  Physical Exam Updated Vital Signs BP (!) 146/83 (BP Location: Left Arm)    Pulse 96    Temp 98.1 F (36.7 C) (Oral)    Resp 18    Ht 5\' 4"  (1.626 m)    Wt 70.3 kg    SpO2 98%    BMI 26.61 kg/m  Physical Exam Vitals and nursing note reviewed.  Constitutional:      Appearance: Normal appearance.  HENT:     Head: Normocephalic and atraumatic.      Comments: Hoarse voice but per patient this is her baseline.  Eyes:     General:        Right eye: No discharge.        Left eye: No discharge.     Conjunctiva/sclera: Conjunctivae normal.  Pulmonary:     Effort: Pulmonary effort is normal.  Skin:    General: Skin is warm and dry.     Findings: No rash.  Neurological:     General: No focal deficit present.     Mental Status: She is alert.  Psychiatric:        Mood and Affect: Mood normal.        Behavior: Behavior normal.    ED Results / Procedures / Treatments   Labs (all labs ordered are listed, but only abnormal results are displayed) Labs Reviewed - No data to display  EKG None  Radiology DG Neck Soft Tissue  Result Date: 08/05/2021 CLINICAL DATA:  Foreign body sensation EXAM: NECK SOFT TISSUES - 1+ VIEW COMPARISON:  None FINDINGS: Epiglottis and aryepiglottic folds normal appearance. Prevertebral soft tissues normal thickness. No radiopaque foreign bodies identified. Airway grossly patent. IMPRESSION: No acute abnormalities. Electronically Signed   By: Lavonia Dana M.D.   On: 08/05/2021 13:45   DG Chest Portable 1 View  Result Date: 08/05/2021 CLINICAL DATA:  Foreign body sensation, may accidentally swallowed a small piece of a zip Lock bag, cough, irritation in throat/esophagus EXAM: PORTABLE CHEST 1 VIEW COMPARISON:  Portable exam 1324 hours compared to 08/25/2016 FINDINGS: Normal heart size, mediastinal contours, and pulmonary vascularity. Lungs clear. No infiltrate, pleural effusion, or pneumothorax. Osseous structures unremarkable. IMPRESSION: No acute abnormalities. Electronically Signed   By: Lavonia Dana M.D.   On: 08/05/2021 13:46    Procedures Procedures    Medications Ordered in ED Medications - No data to display  ED Course/ Medical Decision Making/ A&P                           Medical Decision Making Amount and/or Complexity of Data Reviewed Radiology: ordered.   Tracy Fisher Scarpino is a 76 y.o. female  with no significant complication back to care today who presents to the emergency department with a foreign body sensation in throat.  Given the mechanism I have a low suspicion for any residual pieces of plastic in her throat.  We will get a neck and chest x-ray to evaluate for other sources of foreign body.  She is in no respiratory distress at this time.  She is able to talk in complete sentences.  I will also plan  to fluid challenge her before discharge.  I personally reviewed the imaging which were negative for any foreign bodies.  I do agree with the radiologist interpretation.  On reevaluation, patient was tolerating soda without any problems.  Patient was talking in complete sentences in no respiratory distress.  Overall this is probably laryngeal irritation from her soup incident last night.  This will likely resolve on its own.  I discussed results with the patient at bedside. All questions and concerns addressed. She is safe for discharge. Strict return precautions given.    Final Clinical Impression(s) / ED Diagnoses Final diagnoses:  Foreign body sensation in throat    Rx / DC Orders ED Discharge Orders     None         Hendricks Limes, PA-C 08/05/21 1411    Hendricks Limes, PA-C 08/05/21 1415    Lajean Saver, MD 08/08/21 2213

## 2021-08-07 DIAGNOSIS — R0989 Other specified symptoms and signs involving the circulatory and respiratory systems: Secondary | ICD-10-CM | POA: Diagnosis not present

## 2021-08-07 DIAGNOSIS — R051 Acute cough: Secondary | ICD-10-CM | POA: Diagnosis not present

## 2021-08-07 MED ORDER — PROPOFOL 10 MG/ML IV BOLUS
INTRAVENOUS | Status: AC
  Filled 2021-08-07: qty 20

## 2021-08-07 MED ORDER — PROPOFOL 1000 MG/100ML IV EMUL
INTRAVENOUS | Status: AC
  Filled 2021-08-07: qty 100

## 2021-08-07 MED ORDER — LIDOCAINE HCL (PF) 2 % IJ SOLN
INTRAMUSCULAR | Status: AC
  Filled 2021-08-07: qty 10

## 2021-08-07 MED ORDER — ONDANSETRON HCL 4 MG/2ML IJ SOLN
INTRAMUSCULAR | Status: AC
  Filled 2021-08-07: qty 4

## 2021-08-07 MED ORDER — PHENYLEPHRINE HCL-NACL 20-0.9 MG/250ML-% IV SOLN
INTRAVENOUS | Status: AC
  Filled 2021-08-07: qty 750

## 2021-08-07 MED ORDER — DEXAMETHASONE SODIUM PHOSPHATE 10 MG/ML IJ SOLN
INTRAMUSCULAR | Status: AC
  Filled 2021-08-07: qty 2

## 2021-08-10 ENCOUNTER — Emergency Department (HOSPITAL_BASED_OUTPATIENT_CLINIC_OR_DEPARTMENT_OTHER): Payer: Medicare HMO | Admitting: Radiology

## 2021-08-10 ENCOUNTER — Other Ambulatory Visit: Payer: Self-pay

## 2021-08-10 ENCOUNTER — Encounter (HOSPITAL_BASED_OUTPATIENT_CLINIC_OR_DEPARTMENT_OTHER): Payer: Self-pay

## 2021-08-10 ENCOUNTER — Emergency Department (HOSPITAL_BASED_OUTPATIENT_CLINIC_OR_DEPARTMENT_OTHER)
Admission: EM | Admit: 2021-08-10 | Discharge: 2021-08-10 | Disposition: A | Discharge status: Home / Self Care | Payer: Medicare HMO | Attending: Emergency Medicine | Admitting: Emergency Medicine

## 2021-08-10 DIAGNOSIS — J189 Pneumonia, unspecified organism: Secondary | ICD-10-CM | POA: Diagnosis not present

## 2021-08-10 DIAGNOSIS — Z20822 Contact with and (suspected) exposure to covid-19: Secondary | ICD-10-CM | POA: Diagnosis not present

## 2021-08-10 DIAGNOSIS — J111 Influenza due to unidentified influenza virus with other respiratory manifestations: Secondary | ICD-10-CM

## 2021-08-10 DIAGNOSIS — Z7982 Long term (current) use of aspirin: Secondary | ICD-10-CM | POA: Diagnosis not present

## 2021-08-10 DIAGNOSIS — K0889 Other specified disorders of teeth and supporting structures: Secondary | ICD-10-CM | POA: Insufficient documentation

## 2021-08-10 DIAGNOSIS — R059 Cough, unspecified: Secondary | ICD-10-CM | POA: Diagnosis not present

## 2021-08-10 DIAGNOSIS — R0602 Shortness of breath: Secondary | ICD-10-CM | POA: Diagnosis not present

## 2021-08-10 DIAGNOSIS — Z8579 Personal history of other malignant neoplasms of lymphoid, hematopoietic and related tissues: Secondary | ICD-10-CM | POA: Diagnosis not present

## 2021-08-10 DIAGNOSIS — J101 Influenza due to other identified influenza virus with other respiratory manifestations: Secondary | ICD-10-CM | POA: Insufficient documentation

## 2021-08-10 DIAGNOSIS — Z79899 Other long term (current) drug therapy: Secondary | ICD-10-CM | POA: Diagnosis not present

## 2021-08-10 LAB — CBC WITH DIFFERENTIAL/PLATELET
Abs Immature Granulocytes: 0.01 10*3/uL (ref 0.00–0.07)
Basophils Absolute: 0 10*3/uL (ref 0.0–0.1)
Basophils Relative: 1 %
Eosinophils Absolute: 0 10*3/uL (ref 0.0–0.5)
Eosinophils Relative: 1 %
HCT: 34.4 % — ABNORMAL LOW (ref 36.0–46.0)
Hemoglobin: 11.4 g/dL — ABNORMAL LOW (ref 12.0–15.0)
Immature Granulocytes: 0 %
Lymphocytes Relative: 24 %
Lymphs Abs: 0.7 10*3/uL (ref 0.7–4.0)
MCH: 32.1 pg (ref 26.0–34.0)
MCHC: 33.1 g/dL (ref 30.0–36.0)
MCV: 96.9 fL (ref 80.0–100.0)
Monocytes Absolute: 0.1 10*3/uL (ref 0.1–1.0)
Monocytes Relative: 4 %
Neutro Abs: 1.9 10*3/uL (ref 1.7–7.7)
Neutrophils Relative %: 70 %
Platelets: 110 10*3/uL — ABNORMAL LOW (ref 150–400)
RBC: 3.55 MIL/uL — ABNORMAL LOW (ref 3.87–5.11)
RDW: 15.5 % (ref 11.5–15.5)
WBC: 2.8 10*3/uL — ABNORMAL LOW (ref 4.0–10.5)
nRBC: 0 % (ref 0.0–0.2)

## 2021-08-10 LAB — GROUP A STREP BY PCR: Group A Strep by PCR: NOT DETECTED

## 2021-08-10 LAB — BASIC METABOLIC PANEL
Anion gap: 10 (ref 5–15)
BUN: 12 mg/dL (ref 8–23)
CO2: 23 mmol/L (ref 22–32)
Calcium: 8.9 mg/dL (ref 8.9–10.3)
Chloride: 105 mmol/L (ref 98–111)
Creatinine, Ser: 0.83 mg/dL (ref 0.44–1.00)
GFR, Estimated: >60 mL/min (ref >60)
Glucose, Bld: 92 mg/dL (ref 70–99)
Potassium: 4.1 mmol/L (ref 3.5–5.1)
Sodium: 138 mmol/L (ref 135–145)

## 2021-08-10 LAB — RESP PANEL BY RT-PCR (FLU A&B, COVID) ARPGX2
Influenza A by PCR: POSITIVE — AB
Influenza B by PCR: NEGATIVE
SARS Coronavirus 2 by RT PCR: NEGATIVE

## 2021-08-10 MED ORDER — IPRATROPIUM-ALBUTEROL 0.5-2.5 (3) MG/3ML IN SOLN
3.0000 mL | Freq: Once | RESPIRATORY_TRACT | Status: AC
  Administered 2021-08-10: 3 mL via RESPIRATORY_TRACT
  Filled 2021-08-10: qty 3

## 2021-08-10 MED ORDER — AMOXICILLIN-POT CLAVULANATE 875-125 MG PO TABS: 1.0000 | ORAL_TABLET | Freq: Two times a day (BID) | ORAL | 0 refills | Status: DC

## 2021-08-10 MED ORDER — ALBUTEROL SULFATE HFA 108 (90 BASE) MCG/ACT IN AERS: 1.0000 | INHALATION_SPRAY | Freq: Four times a day (QID) | RESPIRATORY_TRACT | 1 refills | Status: DC | PRN

## 2021-08-10 MED ORDER — OSELTAMIVIR PHOSPHATE 75 MG PO CAPS: 75.0000 mg | ORAL_CAPSULE | Freq: Two times a day (BID) | ORAL | 0 refills | Status: DC

## 2021-08-10 MED ORDER — ONDANSETRON HCL 4 MG PO TABS
4.0000 mg | ORAL_TABLET | Freq: Four times a day (QID) | ORAL | 0 refills | Status: DC
Start: 2021-08-10 — End: 2023-10-11

## 2021-08-10 NOTE — ED Triage Notes (Signed)
Patient here POV from Home with Flu-Like Symptoms.   Patient states she began feeling ill for approximately 3-4 days. Endorses Sore Throat, Productive Cough, Mild Body Aches, Congestion.  OTC Medication has been ineffective at Home. No Fevers at Home.  NAD noted during Triage. A&Ox4. GCS 15. Ambulatory.

## 2021-08-10 NOTE — ED Notes (Signed)
EMT-P provided AVS using Teachback Method. Patient verbalizes understanding of Discharge Instructions. Opportunity for Questioning and Answers were provided by EMT-P. Patient Discharged from ED.  ? ?

## 2021-08-10 NOTE — Discharge Instructions (Signed)
You have the flu this is a viral infection that will likely start to improve after 7-10 days. You may take Tamiflu twice a day for the next 5 days this will help shorten the duration of the flu and prevent complications, this medication can cause some upset stomach, nausea and diarrhea. You may use Zofran as needed for nausea.  Please make sure you are drinking plenty of fluids. You can treat your symptoms supportively with tylenol 650 mg/1085m and ibuprofen 600 mg every 6 hours for fevers and pains. For nasal congestion you can use Zyrtec and Flonase to help with nasal congestion. To treat cough you can use over the counter cough medications such as Mucinex DM or Robitussin and throat lozenges.  I recommend that you follow-up with your primary care doctor in the next 2 to 3 days to ensure that your symptoms are improving.  In addition to the above we are covering you for a possible bacterial pneumonia in context of your history of multiple myeloma.  Please take the entire course of antibiotics that I prescribed.  I recommend that you take these on a full stomach.  You may want to take a probiotic at the same time as the antibiotics to help with stomach upset.  Finally I provided you with an inhaler that you can use if you develop more wheezing, shortness of breath.  If you develop persistent fevers, shortness of breath or difficulty breathing, chest pain, severe headache and neck pain, persistent nausea and vomiting or other new or concerning symptoms return to the Emergency department.

## 2021-08-10 NOTE — ED Provider Notes (Signed)
[place] EMERGENCY DEPT Provider Note   CSN: [id number] Arrival date & time: 08/10/21  1224     History  Chief Complaint  Patient presents with   Cough    Tracy Fisher is a 76 y.o. female with a past medical history significant for double myeloma which she reports has been recently in remission for the last 3 weeks who presents with 3 to 4 days of sore throat, productive cough, body aches, congestion.  Patient reports that she is taking over-the-counter cold and flu medication with minimal relief.  Patient denies any fever at home.  Patient denies any shortness of breath, chest pain.  Patient denies any nausea, vomiting, constipation, diarrhea.  Patient with no history of asthma, COPD.   Cough     Home Medications Prior to Admission medications   Medication Sig Start Date End Date Taking? Authorizing Provider  acyclovir (ZOVIRAX) 400 MG tablet Take 1 tablet (400 mg total) by mouth 2 (two) times daily. 06/18/21   Brunetta Genera, MD  amLODipine (NORVASC) 5 MG tablet  02/01/15   [provider]  aspirin EC 81 MG tablet Take 81 mg by mouth daily. Swallow whole.    [provider]  atorvastatin (LIPITOR) 20 MG tablet  11/25/16   [provider]  azithromycin (ZITHROMAX) 250 MG tablet 2 tabs (568m) on day 1 and then 1 tab (2524m daily x 4 days 07/09/21   KaBrunetta GeneraMD  b complex vitamins capsule Take 1 capsule by mouth daily. 10/10/20   KaBrunetta GeneraMD  brimonidine (ALPHAGAN) 0.2 % ophthalmic solution Place 1 drop into both eyes 2 (two) times daily.  01/30/18   [provider]  Calcium Carbonate-Vitamin D (CALCIUM + D PO) Take 1 tablet by mouth daily.    [provider]  dorzolamide-timolol (COSOPT) 22.3-6.8 MG/ML ophthalmic solution Place 1 drop into both eyes 2 (two) times daily.  11/21/16   [provider]  DULoxetine (CYMBALTA) 60 MG capsule Take 1 capsule (60 mg total) by mouth daily.  05/29/21   KaBrunetta GeneraMD  gabapentin (NEURONTIN) 300 MG capsule TAKE 1 CAPSULE BY MOUTH THREE TIMES DAILY 07/30/21   KaBrunetta GeneraMD  [facility]one[facility]acetaminophen [facility] tablet Take 1 tablet by mouth every 6 (six) hours as needed for moderate pain. 06/19/21   DoOrson SlickMD  lenalidomide (REVLIMID) 10 MG capsule Take 1 capsule (10 mg total) by mouth daily. Take 21 days on, 7 days off, repeat every 28 days. 07/28/21   KaBrunetta GeneraMD  LUMIGAN 0.01 % SOLN INSTILL 1 DROP INTO EACH EYE ONCE DAILY 07/17/20   [provider]  metoCLOPramide (REGLAN) 10 MG tablet Take 1 tablet (10 mg total) by mouth every 8 (eight) hours as needed for nausea or vomiting. Patient not taking: No sig reported 06/04/18   JaOrlie DakinMD  mirtazapine (REMERON) 15 MG tablet Take 15 mg by mouth at bedtime.    [provider]  Multiple Vitamin (MULTIVITAMIN) capsule Take by mouth.    [provider]  ondansetron (ZOFRAN) 8 MG tablet Take 1 tablet (8 mg total) by mouth 2 (two) times daily as needed (Nausea or vomiting). 01/08/21   KaBrunetta GeneraMD  [facility] test [facility] USE STRIP TO CHECK GLUCOSE TWICE DAILY 11/07/20   [provider]  Potassium Chloride ER 20 MEQ TBCR Take 1 tablet by mouth twice daily 06/27/21   KaBrunetta GeneraMD  prochlorperazine (  COMPAZINE) 10 MG tablet Take 1 tablet (10 mg total) by mouth every 6 (six) hours as needed (Nausea or vomiting). 08/28/20 01/08/21  Brunetta Genera, MD      Allergies    Bortezomib and Sulfa antibiotics    Review of Systems   Review of Systems  HENT:  Positive for congestion.   Respiratory:  Positive for cough.   All other systems reviewed and are negative.  Physical Exam Updated Vital Signs BP 137/76 (BP Location: Right Arm)    Pulse 93    Temp 98.5 F (36.9 C)    Resp 16    Ht '5\' 4"'  (1.626 m)    Wt 70.3 kg    SpO2 95%    BMI 26.60 kg/m  Physical Exam Vitals and nursing note  reviewed.  Constitutional:      General: She is not in acute distress.    Appearance: Normal appearance.     Comments: Somewhat chronically ill-appearing patient in no acute distress  HENT:     Head: Normocephalic and atraumatic.     Mouth/Throat:     Comments: Some redness of the posterior oropharynx without exudate.  Tonsils 1+, uvula midline.  No evidence of peritonsillar abscess.  No floor of mouth redness or swelling.  Some poor dentition noted. Eyes:     General:        Right eye: No discharge.        Left eye: No discharge.  Cardiovascular:     Rate and Rhythm: Normal rate and regular rhythm.     Heart sounds: No murmur heard.   No friction rub. No gallop.  Pulmonary:     Effort: Pulmonary effort is normal.     Breath sounds: Normal breath sounds.     Comments: Patient does have scattered crackles that clear with cough, as well as expiratory wheezing bilateral lung bases.  I do not note any focal consolidation, respiratory distress. Abdominal:     General: Bowel sounds are normal.     Palpations: Abdomen is soft.  Skin:    General: Skin is warm and dry.     Capillary Refill: Capillary refill takes less than 2 seconds.  Neurological:     Mental Status: She is alert and oriented to person, place, and time.  Psychiatric:        Mood and Affect: Mood normal.        Behavior: Behavior normal.    ED Results / Procedures / Treatments   Labs (all labs ordered are listed, but only abnormal results are displayed) Labs Reviewed  GROUP A STREP BY PCR  RESP PANEL BY RT-PCR (FLU A&B, COVID) [id number]  CBC WITH DIFFERENTIAL/PLATELET  BASIC METABOLIC PANEL    EKG None  Radiology No results found.  Procedures Procedures    Medications Ordered in ED Medications  ipratropium-albuterol (DUONEB) 0.5-2.5 (3) MG/3ML nebulizer solution 3 mL (has no administration in time range)    ED Course/ Medical Decision Making/ A&P                           Medical Decision  Making Amount and/or Complexity of Data Reviewed Labs: ordered. Radiology: ordered.  Risk Prescription drug management.  I discussed this case with my attending physician who cosigned this note including patient's presenting symptoms, physical exam, and planned diagnostics and interventions. Attending physician stated agreement with plan or made changes to plan which were implemented.   This patient presents to  the ED for concern of shortness of breath, productive cough, body aches, congestion, this involves an extensive number of treatment options, and is a complaint that carries with it a high risk of complications and morbidity. The emergent differential diagnosis includes, but is not limited to, pneumonia, acute bronchitis, sepsis, acute respiratory distress, acute hypoxic respiratory failure, additional concern for possible lung cancer, symptoms related to worsening of patient's multiple myeloma diagnosis.  This is not an exhaustive differential.   Co morbidities that complicate the patient evaluation: History of multiple myeloma, osteopenia.  Additional history obtained from patient's daughter. External records from outside source obtained and reviewed including recent PCP, emergency department visits.  Physical Exam: Physical exam performed. The pertinent findings include: Patient with some bilateral wheezing at lung bases, as well as some crackles that clear with cough.  She is not in any acute respiratory distress.  She is somewhat chronically ill-appearing.  No significant bruising or bleeding noted.  No tenderness to palpation of chest wall.  Lab Tests: I Ordered, and personally interpreted labs.  The pertinent results include: With low white blood cells, mild anemia, thrombocytopenia.  Her anemia, thrombocytopenia slightly lower than patient's average but not significantly so.  Minimal clinical concern for acute TTP most associated with patient's flu pneumonia at this time.  Her BMP  is significant for normal lab values.  Her RVP is positive for flu A.  Her strep is negative.   Imaging Studies: I ordered imaging studies including plain film radiograph of the chest. I independently visualized and interpreted imaging which showed multifocal infiltrate consistent with pneumonia. I agree with the radiologist interpretation.   Cardiac Monitoring:  The patient was maintained on a cardiac monitor.  My attending physician Dr. Regenia Skeeter viewed and interpreted the cardiac monitored which showed an underlying rhythm of: Sinus rhythm, probable left ventricular hypertrophy.  She maintained this rhythm for the entirety of her stay in the emergency department.   Medications: I ordered medication including DuoNeb for wheezing. Reevaluation of the patient after these medicines showed that the patient improved. I have reviewed the patients home medicines and have made adjustments as needed.  Patient still with some scattered crackles at lung bases, but wheezing has significantly improved with DuoNeb.  Disposition: After consideration of the diagnostic results and the patients response to treatment, I feel that patient's clinical picture is consistent with flu pneumonia at this time, however given her immunosuppression with history of multiple myeloma believe it is reasonable to cover her for possible early bacterial pneumonia at this time.  We will discharge with Augmentin, Tamiflu, inhaler for any additional wheezing or shortness of breath, as well as Zofran for nausea.  Encourage close follow-up with PCP in the next 2 to 3 days to ensure improvement of symptoms.  As patient's vital signs are stable, she is afebrile at this time, has not required any supplemental oxygen believe that she is stable for discharge to home at this time.  Discussed expectations for normal clinical course of flu pneumonia, and extensive return precautions given.   Final Clinical Impression(s) / ED Diagnoses Final  diagnoses:  None    Rx / DC Orders ED Discharge Orders     None         Dorien Chihuahua 08/10/21 1512    Sherwood Gambler, MD 08/13/21 [phone]

## 2021-08-13 ENCOUNTER — Telehealth: Payer: Self-pay | Admitting: Hematology

## 2021-08-13 NOTE — Telephone Encounter (Signed)
Rescheduled upcoming appointment due to provider's PAL. Patient is aware of changes. ?

## 2021-08-20 ENCOUNTER — Other Ambulatory Visit: Payer: Self-pay

## 2021-08-20 DIAGNOSIS — C9000 Multiple myeloma not having achieved remission: Secondary | ICD-10-CM

## 2021-08-20 MED ORDER — LENALIDOMIDE 10 MG PO CAPS: 10.0000 mg | ORAL_CAPSULE | Freq: Every day | ORAL | 0 refills | Status: DC

## 2021-08-21 DIAGNOSIS — J309 Allergic rhinitis, unspecified: Secondary | ICD-10-CM | POA: Diagnosis not present

## 2021-08-22 ENCOUNTER — Other Ambulatory Visit: Payer: Self-pay

## 2021-08-22 DIAGNOSIS — C9000 Multiple myeloma not having achieved remission: Secondary | ICD-10-CM

## 2021-08-24 ENCOUNTER — Encounter: Payer: Self-pay | Admitting: Hematology

## 2021-08-24 MED ORDER — HYDROCODONE-ACETAMINOPHEN 5-325 MG PO TABS: 1.0000 | ORAL_TABLET | Freq: Four times a day (QID) | ORAL | 0 refills | Status: DC | PRN

## 2021-08-25 ENCOUNTER — Other Ambulatory Visit: Payer: Self-pay

## 2021-08-25 DIAGNOSIS — C9000 Multiple myeloma not having achieved remission: Secondary | ICD-10-CM

## 2021-08-25 MED ORDER — LENALIDOMIDE 10 MG PO CAPS: 10.0000 mg | ORAL_CAPSULE | Freq: Every day | ORAL | 0 refills | Status: DC

## 2021-08-27 ENCOUNTER — Other Ambulatory Visit (HOSPITAL_COMMUNITY): Payer: Self-pay

## 2021-08-28 ENCOUNTER — Other Ambulatory Visit: Payer: Self-pay

## 2021-08-28 ENCOUNTER — Inpatient Hospital Stay: Payer: Medicare HMO

## 2021-08-28 ENCOUNTER — Inpatient Hospital Stay: Payer: Medicare HMO | Admitting: Hematology

## 2021-08-28 DIAGNOSIS — C9000 Multiple myeloma not having achieved remission: Secondary | ICD-10-CM

## 2021-08-28 MED ORDER — LENALIDOMIDE 10 MG PO CAPS: 10.0000 mg | ORAL_CAPSULE | Freq: Every day | ORAL | 0 refills | Status: DC

## 2021-09-02 ENCOUNTER — Ambulatory Visit (HOSPITAL_COMMUNITY)
Admission: RE | Admit: 2021-09-02 | Discharge: 2021-09-02 | Disposition: A | Discharge status: Home / Self Care | Payer: Medicare HMO | Source: Ambulatory Visit | Attending: Hematology | Admitting: Hematology

## 2021-09-02 ENCOUNTER — Inpatient Hospital Stay: Payer: Medicare HMO | Admitting: Hematology

## 2021-09-02 ENCOUNTER — Other Ambulatory Visit: Payer: Self-pay

## 2021-09-02 ENCOUNTER — Inpatient Hospital Stay: Payer: Medicare HMO | Attending: Hematology

## 2021-09-02 VITALS — BP 136/74 | HR 77 | Temp 98.1°F | Resp 20 | Wt 151.9 lb

## 2021-09-02 DIAGNOSIS — Z8709 Personal history of other diseases of the respiratory system: Secondary | ICD-10-CM | POA: Diagnosis not present

## 2021-09-02 DIAGNOSIS — R0789 Other chest pain: Secondary | ICD-10-CM | POA: Diagnosis not present

## 2021-09-02 DIAGNOSIS — C9001 Multiple myeloma in remission: Secondary | ICD-10-CM | POA: Insufficient documentation

## 2021-09-02 DIAGNOSIS — C9000 Multiple myeloma not having achieved remission: Secondary | ICD-10-CM

## 2021-09-02 LAB — CMP (CANCER CENTER ONLY)
ALT: 15 U/L (ref 0–44)
AST: 16 U/L (ref 15–41)
Albumin: 4.1 g/dL (ref 3.5–5.0)
Alkaline Phosphatase: 71 U/L (ref 38–126)
Anion gap: 3 — ABNORMAL LOW (ref 5–15)
BUN: 11 mg/dL (ref 8–23)
CO2: 27 mmol/L (ref 22–32)
Calcium: 9.5 mg/dL (ref 8.9–10.3)
Chloride: 112 mmol/L — ABNORMAL HIGH (ref 98–111)
Creatinine: 0.68 mg/dL (ref 0.44–1.00)
GFR, Estimated: >60 mL/min (ref >60)
Glucose, Bld: 106 mg/dL — ABNORMAL HIGH (ref 70–99)
Potassium: 3.8 mmol/L (ref 3.5–5.1)
Sodium: 142 mmol/L (ref 135–145)
Total Bilirubin: 0.4 mg/dL (ref 0.3–1.2)
Total Protein: 6.3 g/dL — ABNORMAL LOW (ref 6.5–8.1)

## 2021-09-02 LAB — CBC WITH DIFFERENTIAL (CANCER CENTER ONLY)
Abs Immature Granulocytes: 0 10*3/uL (ref 0.00–0.07)
Basophils Absolute: 0.1 10*3/uL (ref 0.0–0.1)
Basophils Relative: 3 %
Eosinophils Absolute: 0.1 10*3/uL (ref 0.0–0.5)
Eosinophils Relative: 2 %
HCT: 34.6 % — ABNORMAL LOW (ref 36.0–46.0)
Hemoglobin: 11.1 g/dL — ABNORMAL LOW (ref 12.0–15.0)
Immature Granulocytes: 0 %
Lymphocytes Relative: 33 %
Lymphs Abs: 1.2 10*3/uL (ref 0.7–4.0)
MCH: 31.1 pg (ref 26.0–34.0)
MCHC: 32.1 g/dL (ref 30.0–36.0)
MCV: 96.9 fL (ref 80.0–100.0)
Monocytes Absolute: 0.6 10*3/uL (ref 0.1–1.0)
Monocytes Relative: 15 %
Neutro Abs: 1.8 10*3/uL (ref 1.7–7.7)
Neutrophils Relative %: 47 %
Platelet Count: 208 10*3/uL (ref 150–400)
RBC: 3.57 MIL/uL — ABNORMAL LOW (ref 3.87–5.11)
RDW: 15.4 % (ref 11.5–15.5)
WBC Count: 3.8 10*3/uL — ABNORMAL LOW (ref 4.0–10.5)
nRBC: 0 % (ref 0.0–0.2)

## 2021-09-02 MED ORDER — GABAPENTIN 300 MG PO CAPS: 300.0000 mg | ORAL_CAPSULE | Freq: Three times a day (TID) | ORAL | 0 refills | Status: DC

## 2021-09-02 MED ORDER — ACYCLOVIR 400 MG PO TABS: 400.0000 mg | ORAL_TABLET | Freq: Two times a day (BID) | ORAL | 3 refills | Status: DC

## 2021-09-03 ENCOUNTER — Telehealth: Payer: Self-pay | Admitting: Hematology

## 2021-09-03 ENCOUNTER — Other Ambulatory Visit: Payer: Self-pay | Admitting: Hematology

## 2021-09-03 DIAGNOSIS — C9000 Multiple myeloma not having achieved remission: Secondary | ICD-10-CM

## 2021-09-03 LAB — KAPPA/LAMBDA LIGHT CHAINS
Kappa free light chain: 6.6 mg/L (ref 3.3–19.4)
Kappa, lambda light chain ratio: 1.94 — ABNORMAL HIGH (ref 0.26–1.65)
Lambda free light chains: 3.4 mg/L — ABNORMAL LOW (ref 5.7–26.3)

## 2021-09-03 NOTE — Telephone Encounter (Signed)
Left message with follow-up appointment per 3/7 los. ?

## 2021-09-04 ENCOUNTER — Encounter: Payer: Self-pay | Admitting: Hematology

## 2021-09-04 LAB — SURGICAL PATHOLOGY

## 2021-09-05 ENCOUNTER — Emergency Department (HOSPITAL_BASED_OUTPATIENT_CLINIC_OR_DEPARTMENT_OTHER)
Admission: EM | Admit: 2021-09-05 | Discharge: 2021-09-05 | Disposition: A | Discharge status: Home / Self Care | Payer: Medicare HMO | Attending: Emergency Medicine | Admitting: Emergency Medicine

## 2021-09-05 ENCOUNTER — Other Ambulatory Visit: Payer: Self-pay

## 2021-09-05 ENCOUNTER — Encounter (HOSPITAL_BASED_OUTPATIENT_CLINIC_OR_DEPARTMENT_OTHER): Payer: Self-pay | Admitting: Emergency Medicine

## 2021-09-05 DIAGNOSIS — Z7982 Long term (current) use of aspirin: Secondary | ICD-10-CM | POA: Insufficient documentation

## 2021-09-05 DIAGNOSIS — K1379 Other lesions of oral mucosa: Secondary | ICD-10-CM | POA: Insufficient documentation

## 2021-09-05 DIAGNOSIS — S0993XA Unspecified injury of face, initial encounter: Secondary | ICD-10-CM

## 2021-09-05 LAB — MULTIPLE MYELOMA PANEL, SERUM
Albumin SerPl Elph-Mcnc: 3.6 g/dL (ref 2.9–4.4)
Albumin/Glob SerPl: 1.6 (ref 0.7–1.7)
Alpha 1: 0.2 g/dL (ref 0.0–0.4)
Alpha2 Glob SerPl Elph-Mcnc: 0.8 g/dL (ref 0.4–1.0)
B-Globulin SerPl Elph-Mcnc: 0.8 g/dL (ref 0.7–1.3)
Gamma Glob SerPl Elph-Mcnc: 0.4 g/dL (ref 0.4–1.8)
Globulin, Total: 2.3 g/dL (ref 2.2–3.9)
IgA: 18 mg/dL — ABNORMAL LOW (ref 64–422)
IgG (Immunoglobin G), Serum: 413 mg/dL — ABNORMAL LOW (ref 586–1602)
IgM (Immunoglobulin M), Srm: 14 mg/dL — ABNORMAL LOW (ref 26–217)
Total Protein ELP: 5.9 g/dL — ABNORMAL LOW (ref 6.0–8.5)

## 2021-09-05 MED ORDER — CHLORHEXIDINE GLUCONATE 0.12 % MT SOLN
15.0000 mL | Freq: Two times a day (BID) | OROMUCOSAL | 0 refills | Status: DC
Start: 2021-09-05 — End: 2022-04-06

## 2021-09-05 NOTE — ED Provider Notes (Signed)
?[facility] ?Provider Note ? ? ?CSN: [id number] ?Arrival date & time: 09/05/21  1904 ? ?  ? ?History ? ?Chief Complaint  ?Patient presents with  ? Mouth Lesions  ? ? ?Tracy Fisher is a 76 y.o. female.  Who presents emergency department chief complaint of injury to the left buccal mucosa.  Patient states that she was eating very hot chili from [facility] yesterday which she noticed when she got home a large bulla on the inside of her cheek.  She was not sure what to do so she came to the emergency department.  She denies any significant pain. ? ? ?Mouth Lesions ? ?  ? ?Home Medications ?Prior to Admission medications   ?Medication Sig Start Date End Date Taking? Authorizing Provider  ?acyclovir (ZOVIRAX) 400 MG tablet Take 1 tablet (400 mg total) by mouth 2 (two) times daily. 09/02/21   Brunetta Genera, MD  ?albuterol (VENTOLIN HFA) 108 (90 Base) MCG/ACT inhaler Inhale 1-2 puffs into the lungs every 6 (six) hours as needed for wheezing or shortness of breath. 08/10/21   Prosperi, Christian H, PA-C  ?amLODipine (NORVASC) 5 MG tablet  02/01/15   [provider]  ?amoxicillin-clavulanate (AUGMENTIN) 875-125 MG tablet Take 1 tablet by mouth every 12 (twelve) hours. 08/10/21   Prosperi, Christian H, PA-C  ?aspirin EC 81 MG tablet Take 81 mg by mouth daily. Swallow whole.    [provider]  ?atorvastatin (LIPITOR) 20 MG tablet  11/25/16   [provider]  ?azithromycin (ZITHROMAX) 250 MG tablet 2 tabs ('500mg'$ ) on day 1 and then 1 tab ('250mg'$ ) daily x 4 days 07/09/21   Brunetta Genera, MD  ?b complex vitamins capsule Take 1 capsule by mouth daily. 10/10/20   Brunetta Genera, MD  ?brimonidine (ALPHAGAN) 0.2 % ophthalmic solution Place 1 drop into both eyes 2 (two) times daily.  01/30/18   [provider]  ?Calcium Carbonate-Vitamin D (CALCIUM + D PO) Take 1 tablet by mouth daily.    [provider]  ?dorzolamide-timolol (COSOPT) 22.3-6.8 MG/ML  ophthalmic solution Place 1 drop into both eyes 2 (two) times daily.  11/21/16   [provider]  ?DULoxetine (CYMBALTA) 60 MG capsule Take 1 capsule (60 mg total) by mouth daily. 05/29/21   Brunetta Genera, MD  ?gabapentin (NEURONTIN) 300 MG capsule Take 1 capsule (300 mg total) by mouth 3 (three) times daily. 09/02/21   Brunetta Genera, MD  ?HYDROcodone-acetaminophen (NORCO) 5-325 MG tablet Take 1 tablet by mouth every 6 (six) hours as needed for moderate pain. 08/24/21   Brunetta Genera, MD  ?lenalidomide (REVLIMID) 10 MG capsule Take 1 capsule (10 mg total) by mouth daily. Take 21 days on, 7 days off, repeat every 28 days. 08/28/21   Brunetta Genera, MD  ?LUMIGAN 0.01 % SOLN INSTILL 1 DROP INTO EACH EYE ONCE DAILY 07/17/20   [provider]  ?metoCLOPramide (REGLAN) 10 MG tablet Take 1 tablet (10 mg total) by mouth every 8 (eight) hours as needed for nausea or vomiting. ?Patient not taking: No sig reported 06/04/18   Orlie Dakin, MD  ?mirtazapine (REMERON) 15 MG tablet Take 15 mg by mouth at bedtime.    [provider]  ?Multiple Vitamin (MULTIVITAMIN) capsule Take by mouth.    [provider]  ?ondansetron (ZOFRAN) 4 MG tablet Take 1 tablet (4 mg total) by mouth every 6 (six) hours. 08/10/21   Prosperi, Darrick Meigs H, PA-C  ?ONETOUCH VERIO test strip  USE STRIP TO CHECK GLUCOSE TWICE DAILY 11/07/20   [provider]  ?oseltamivir (TAMIFLU) 75 MG capsule Take 1 capsule (75 mg total) by mouth every 12 (twelve) hours. 08/10/21   Prosperi, Darrick Meigs H, PA-C  ?Potassium Chloride ER 20 MEQ TBCR Take 1 tablet by mouth twice daily 09/04/21   Brunetta Genera, MD  ?prochlorperazine (COMPAZINE) 10 MG tablet Take 1 tablet (10 mg total) by mouth every 6 (six) hours as needed (Nausea or vomiting). 08/28/20 01/08/21  Brunetta Genera, MD  ?   ? ?Allergies    ?Bortezomib and Sulfa antibiotics   ? ?Review of Systems   ?Review of Systems  ?HENT:  Positive for mouth sores.    ? ?Physical Exam ?Updated Vital Signs ?BP 134/73 (BP Location: Right Arm)   Pulse 84   Temp 98.1 ?F (36.7 ?C)   Resp 18   Ht '5\' 3"'$  (1.6 m)   Wt 68.5 kg   SpO2 97%   BMI 26.75 kg/m?  ?Physical Exam ?Vitals and nursing note reviewed.  ?Constitutional:   ?   General: She is not in acute distress. ?   Appearance: She is well-developed. She is not diaphoretic.  ?HENT:  ?   Head: Normocephalic and atraumatic.  ?   Right Ear: External ear normal.  ?   Left Ear: External ear normal.  ?   Nose: Nose normal.  ?   Mouth/Throat:  ?   Mouth: Mucous membranes are moist.  ?   Comments: Traumatic hemorrhagic bulla of the left buccal mucosa ?Eyes:  ?   General: No scleral icterus. ?   Conjunctiva/sclera: Conjunctivae normal.  ?Cardiovascular:  ?   Rate and Rhythm: Normal rate and regular rhythm.  ?   Heart sounds: Normal heart sounds. No murmur heard. ?  No friction rub. No gallop.  ?Pulmonary:  ?   Effort: Pulmonary effort is normal. No respiratory distress.  ?   Breath sounds: Normal breath sounds.  ?Abdominal:  ?   General: Bowel sounds are normal. There is no distension.  ?   Palpations: Abdomen is soft. There is no mass.  ?   Tenderness: There is no abdominal tenderness. There is no guarding.  ?Musculoskeletal:  ?   Cervical back: Normal range of motion.  ?Skin: ?   General: Skin is warm and dry.  ?Neurological:  ?   Mental Status: She is alert and oriented to person, place, and time.  ?Psychiatric:     ?   Behavior: Behavior normal.  ? ? ?ED Results / Procedures / Treatments   ?Labs ?(all labs ordered are listed, but only abnormal results are displayed) ?Labs Reviewed - No data to display ? ?EKG ?None ? ?Radiology ?No results found. ? ?Procedures ?Procedures  ? ? ?Medications Ordered in ED ?Medications - No data to display ? ?ED Course/ Medical Decision Making/ A&P ?  ?                        ?Medical Decision Making ?Patient here with hemorrhagic bulla of the left buccal mucosa.  During her stay it spontaneously  burst.  Patient rinsed her mouth  thoroughly with water.  No other abnormal lesions.  Will discharge with Peridex and outpatient follow-up with PCP or dentist.  No other acute findings.  Patient appears appropriate for discharge ? ?Risk ?Prescription drug management. ? ? ? ?Final Clinical Impression(s) / ED Diagnoses ?Final diagnoses:  ?None  ? ? ?Rx / DC  Orders ?ED Discharge Orders   ? ? None  ? ?  ? ? ?  ?Margarita Mail, PA-C ?09/05/21 2241 ? ?  ?Gareth Morgan, MD ?09/06/21 1752 ? ?

## 2021-09-05 NOTE — ED Triage Notes (Signed)
Pt ate Tracy Fisher chili today and burnt the inside of cheek on left side. Pt states she looked in her mouth and noticed a red blister in mouth.  ?

## 2021-09-05 NOTE — ED Notes (Signed)
ED Provider at bedside. 

## 2021-09-05 NOTE — ED Notes (Signed)
EMT-P provided AVS using Teachback Method. Patient verbalizes understanding of Discharge Instructions. Opportunity for Questioning and Answers were provided by EMT-P. Patient Discharged from ED.  ? ?

## 2021-09-05 NOTE — Discharge Instructions (Signed)
Contact a doctor if: ?Medicine does not help your pain. ?You have a fever or chills. ?You have redness, swelling, or pain on your wound that is getting worse. ?You have fresh bleeding or pus coming from your wound. ?You have swollen or tender glands in your throat. ?Get help right away if: ?The edges of your wound break open. ?Your face or the area under your jaw gets swollen. ?You have trouble breathing or swallowing. ?

## 2021-09-09 ENCOUNTER — Encounter: Payer: Self-pay | Admitting: Hematology

## 2021-09-09 NOTE — Progress Notes (Addendum)
[facility] ?FOLLOW-UP progress notes ? ?DOS .09/02/2021 ? ?Patient Care Team: ?Harlan Stains, MD as PCP - General (Family Medicine) ?Brunetta Genera, MD as Consulting Physician (Hematology) ? ?CHIEF COMPLAINTS/PURPOSE OF VISIT:  ?Follow-up for continued evaluation and management of multiple myeloma ? ?HISTORY OF PRESENTING ILLNESS: Please see previous note ? ?INTERVAL HISTORY ? ?Tracy Fisher is here for continued evaluation and management of her multiple myeloma. ?She notes no acute new symptoms since her last clinic visit.  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 ?She did have an upper respiratory tract infection with influenza A and a possible secondary bacterial infection and was given Augmentin by her primary care physician and mid February.  She notes this is resolved. ?We discussed  PET CT scan on 07/16/2021 which showed no evidence of active multiple myeloma. ?We discussed her last bone marrow biopsy on 07/18/2021 showed normocellular marrow with no morphologic evidence of plasma cell myeloma. ? ?We discussed transitioning to maintenance Revlimid at 10 mg 3 weeks on 1 week off and she is agreeable with this. ?No other acute new symptoms. ? ?Oncology History  ?Smoldering multiple myeloma (SMM)  ?Multiple myeloma ([place])  ?08/28/2020 Initial Diagnosis  ? Multiple myeloma ([place]) ?  ?09/09/2020 - 10/07/2020 Chemotherapy  ?  ? ?  ? ?  ?01/20/2021 -  Chemotherapy  ? Patient is on Treatment Plan : MYELOMA NEWLY DIAGNOSED TRANSPLANT CANDIDATE DaraVRd (Daratumumab IV) q21d x 6 Cycles (Induction/Consolidation)  ?   ? ? ?MEDICAL HISTORY:  ?Past Medical History:  ?Diagnosis Date  ? Bone cancer ([place]) 2018  ? started chemo pill in  2018  ? Diabetes mellitus without complication ([place])   ? Hypertension   ? Vocal cord cyst   ? ? ?SURGICAL HISTORY: ?No past surgical history on file. ? ?SOCIAL HISTORY: ?Social History  ? ?Socioeconomic History  ? Marital  status: Divorced  ?  Spouse name: Not on file  ? Number of children: Not on file  ? Years of education: Not on file  ? Highest education level: Not on file  ?Occupational History  ? Not on file  ?Tobacco Use  ? Smoking status: Never  ? Smokeless tobacco: Never  ?Vaping Use  ? Vaping Use: Never used  ?Substance and Sexual Activity  ? Alcohol use: No  ? Drug use: Never  ? Sexual activity: Not on file  ?Other Topics Concern  ? Not on file  ?Social History Narrative  ? Not on file  ? ?Social Determinants of Health  ? ?Financial Resource Strain: Not on file  ?Food Insecurity: Not on file  ?Transportation Needs: Not on file  ?Physical Activity: Not on file  ?Stress: Not on file  ?Social Connections: Not on file  ?Intimate Partner Violence: Not on file  ? ? ?FAMILY HISTORY: ?Family History  ?Problem Relation Age of Onset  ? Breast cancer Neg Hx   ? ? ?ALLERGIES:  is allergic to bortezomib and sulfa antibiotics. ? ?MEDICATIONS:  ?Current Outpatient Medications  ?Medication Sig Dispense Refill  ? acyclovir (ZOVIRAX) 400 MG tablet Take 1 tablet (400 mg total) by mouth 2 (two) times daily. 60 tablet 3  ? albuterol (VENTOLIN HFA) 108 (90 Base) MCG/ACT inhaler Inhale 1-2 puffs into the lungs every 6 (six) hours as needed for wheezing or shortness of breath. 8 g 1  ? amLODipine (NORVASC) 5 MG tablet     ? amoxicillin-clavulanate (AUGMENTIN) 875-125 MG tablet Take 1 tablet by  mouth every 12 (twelve) hours. 14 tablet 0  ? aspirin EC 81 MG tablet Take 81 mg by mouth daily. Swallow whole.    ? atorvastatin (LIPITOR) 20 MG tablet     ? azithromycin (ZITHROMAX) 250 MG tablet 2 tabs (500mg) on day 1 and then 1 tab (250mg) daily x 4 days 6 each 0  ? b complex vitamins capsule Take 1 capsule by mouth daily. 30 capsule 11  ? brimonidine (ALPHAGAN) 0.2 % ophthalmic solution Place 1 drop into both eyes 2 (two) times daily.   3  ? Calcium Carbonate-Vitamin D (CALCIUM + D PO) Take 1 tablet by mouth daily.    ? chlorhexidine (PERIDEX) 0.12 %  solution Use as directed 15 mLs in the mouth or throat 2 (two) times daily. 120 mL 0  ? dorzolamide-timolol (COSOPT) 22.3-6.8 MG/ML ophthalmic solution Place 1 drop into both eyes 2 (two) times daily.   12  ? DULoxetine (CYMBALTA) 60 MG capsule Take 1 capsule (60 mg total) by mouth daily. 30 capsule 2  ? gabapentin (NEURONTIN) 300 MG capsule Take 1 capsule (300 mg total) by mouth 3 (three) times daily. 90 capsule 0  ? HYDROcodone-acetaminophen (NORCO) 5-325 MG tablet Take 1 tablet by mouth every 6 (six) hours as needed for moderate pain. 60 tablet 0  ? lenalidomide (REVLIMID) 10 MG capsule Take 1 capsule (10 mg total) by mouth daily. Take 21 days on, 7 days off, repeat every 28 days. 21 capsule 0  ? LUMIGAN 0.01 % SOLN INSTILL 1 DROP INTO EACH EYE ONCE DAILY    ? metoCLOPramide (REGLAN) 10 MG tablet Take 1 tablet (10 mg total) by mouth every 8 (eight) hours as needed for nausea or vomiting. (Patient not taking: No sig reported) 6 tablet 0  ? mirtazapine (REMERON) 15 MG tablet Take 15 mg by mouth at bedtime.    ? Multiple Vitamin (MULTIVITAMIN) capsule Take by mouth.    ? ondansetron (ZOFRAN) 4 MG tablet Take 1 tablet (4 mg total) by mouth every 6 (six) hours. 12 tablet 0  ? ONETOUCH VERIO test strip USE STRIP TO CHECK GLUCOSE TWICE DAILY    ? oseltamivir (TAMIFLU) 75 MG capsule Take 1 capsule (75 mg total) by mouth every 12 (twelve) hours. 10 capsule 0  ? Potassium Chloride ER 20 MEQ TBCR Take 1 tablet by mouth twice daily 60 tablet 0  ? ?No current facility-administered medications for this visit.  ? ? ?REVIEW OF SYSTEMS:   ?10 Point review of Systems was done is negative except as noted above. ? ?PHYSICAL EXAMINATION: ?ECOG PERFORMANCE STATUS: 1 - Symptomatic but completely ambulatory ? ?Vitals:  ? 09/02/21 1015  ?BP: 136/74  ?Pulse: 77  ?Resp: 20  ?Temp: 98.1 ?F (36.7 ?C)  ?SpO2: 97%  ? ?Filed Weights  ? 09/02/21 1015  ?Weight: 151 lb 14.4 oz (68.9 kg)  ?NAD ?GENERAL:alert, in no acute distress and  comfortable ?SKIN: no acute rashes, no significant lesions ?EYES: conjunctiva are pink and non-injected, sclera anicteric ?OROPHARYNX: MMM, no exudates, no oropharyngeal erythema or ulceration ?NECK: supple, no JVD ?LYMPH:  no palpable lymphadenopathy in the cervical, axillary or inguinal regions ?LUNGS: clear to auscultation b/l with normal respiratory effort ?HEART: regular rate & rhythm ?ABDOMEN:  normoactive bowel sounds , non tender, not distended. ?Extremity: no pedal edema ?PSYCH: alert & oriented x 3 with fluent speech ?NEURO: no focal motor/sensory deficits ? ? ?LABORATORY DATA:  ? ?CBC Latest Ref Rng & Units 09/02/2021 08/10/2021 07/28/2021  ?WBC 4.0 -   10.5 K/uL 3.8(L) 2.8(L) 2.4(L)  ?Hemoglobin 12.0 - 15.0 g/dL 11.1(L) 11.4(L) 12.1  ?Hematocrit 36.0 - 46.0 % 34.6(L) 34.4(L) 35.8(L)  ?Platelets 150 - 400 K/uL 208 110(L) 192  ? ?. ?CMP Latest Ref Rng & Units 09/02/2021 08/10/2021 07/28/2021  ?Glucose 70 - 99 mg/dL 106(H) 92 110(H)  ?BUN 8 - 23 mg/dL 11 12 11  ?Creatinine 0.44 - 1.00 mg/dL 0.68 0.83 0.76  ?Sodium 135 - 145 mmol/L 142 138 141  ?Potassium 3.5 - 5.1 mmol/L 3.8 4.1 3.9  ?Chloride 98 - 111 mmol/L 112(H) 105 109  ?CO2 22 - 32 mmol/L 27 23 25  ?Calcium 8.9 - 10.3 mg/dL 9.5 8.9 9.3  ?Total Protein 6.5 - 8.1 g/dL 6.3(L) - 6.3(L)  ?Total Bilirubin 0.3 - 1.2 mg/dL 0.4 - 0.5  ?Alkaline Phos 38 - 126 U/L 71 - 71  ?AST 15 - 41 U/L 16 - 18  ?ALT 0 - 44 U/L 15 - 20  ? ? ?RADIOGRAPHIC STUDIES: ?I have personally reviewed the radiological images as listed and agreed with the findings in the report. ?DG Chest 2 View ? ?Result Date: 09/02/2021 ?CLINICAL DATA:  History of myeloma, influenza, right lower chest pressure. Rule out bacterial pneumonia. EXAM: CHEST - 2 VIEW COMPARISON:  Chest radiograph 08/10/2021 FINDINGS: The cardiomediastinal silhouette is normal. There is no focal consolidation or pulmonary edema. There is no pleural effusion or pneumothorax. Previously seen opacities have resolved. There is no acute  osseous abnormality. IMPRESSION: No radiographic evidence of acute cardiopulmonary process. Previously seen opacities have resolved. Electronically Signed   By: Peter  Noone M.D.   On: 09/02/2021 12:11  ? ?DG Chest 2 View ? ?R

## 2021-09-09 NOTE — Addendum Note (Signed)
Addended by: Sullivan Lone on: 09/09/2021 08:09 AM ? ? Modules accepted: Level of Service ? ?

## 2021-09-12 ENCOUNTER — Other Ambulatory Visit: Payer: Self-pay

## 2021-09-12 DIAGNOSIS — C9000 Multiple myeloma not having achieved remission: Secondary | ICD-10-CM

## 2021-09-12 MED ORDER — LENALIDOMIDE 10 MG PO CAPS: 10.0000 mg | ORAL_CAPSULE | Freq: Every day | ORAL | 0 refills | Status: DC

## 2021-09-19 ENCOUNTER — Other Ambulatory Visit: Payer: Self-pay | Admitting: Family Medicine

## 2021-09-19 DIAGNOSIS — Z1231 Encounter for screening mammogram for malignant neoplasm of breast: Secondary | ICD-10-CM

## 2021-09-22 ENCOUNTER — Other Ambulatory Visit: Payer: Self-pay

## 2021-09-22 ENCOUNTER — Ambulatory Visit
Admission: RE | Admit: 2021-09-22 | Discharge: 2021-09-22 | Disposition: A | Discharge status: Home / Self Care | Payer: Medicare HMO | Source: Ambulatory Visit | Attending: Family Medicine | Admitting: Family Medicine

## 2021-09-22 DIAGNOSIS — M8589 Other specified disorders of bone density and structure, multiple sites: Secondary | ICD-10-CM | POA: Diagnosis not present

## 2021-09-22 DIAGNOSIS — C9000 Multiple myeloma not having achieved remission: Secondary | ICD-10-CM

## 2021-09-22 DIAGNOSIS — M8580 Other specified disorders of bone density and structure, unspecified site: Secondary | ICD-10-CM

## 2021-09-22 DIAGNOSIS — Z780 Asymptomatic menopausal state: Secondary | ICD-10-CM | POA: Diagnosis not present

## 2021-09-22 MED ORDER — LENALIDOMIDE 10 MG PO CAPS: 10.0000 mg | ORAL_CAPSULE | Freq: Every day | ORAL | 0 refills | Status: DC

## 2021-09-29 ENCOUNTER — Other Ambulatory Visit: Payer: Self-pay | Admitting: Hematology

## 2021-09-29 DIAGNOSIS — C9000 Multiple myeloma not having achieved remission: Secondary | ICD-10-CM

## 2021-10-02 ENCOUNTER — Inpatient Hospital Stay: Payer: Medicare HMO

## 2021-10-02 ENCOUNTER — Inpatient Hospital Stay: Payer: Medicare HMO | Attending: Hematology | Admitting: Hematology

## 2021-10-02 ENCOUNTER — Other Ambulatory Visit: Payer: Self-pay

## 2021-10-02 VITALS — BP 132/86 | HR 81 | Temp 97.5°F | Resp 18 | Wt 150.6 lb

## 2021-10-02 DIAGNOSIS — C9000 Multiple myeloma not having achieved remission: Secondary | ICD-10-CM

## 2021-10-02 DIAGNOSIS — G629 Polyneuropathy, unspecified: Secondary | ICD-10-CM | POA: Diagnosis not present

## 2021-10-02 DIAGNOSIS — Z79899 Other long term (current) drug therapy: Secondary | ICD-10-CM | POA: Diagnosis not present

## 2021-10-02 DIAGNOSIS — M25532 Pain in left wrist: Secondary | ICD-10-CM | POA: Diagnosis not present

## 2021-10-02 DIAGNOSIS — C9001 Multiple myeloma in remission: Secondary | ICD-10-CM

## 2021-10-02 DIAGNOSIS — Z5111 Encounter for antineoplastic chemotherapy: Secondary | ICD-10-CM | POA: Diagnosis not present

## 2021-10-02 DIAGNOSIS — Z7982 Long term (current) use of aspirin: Secondary | ICD-10-CM | POA: Insufficient documentation

## 2021-10-02 DIAGNOSIS — M8580 Other specified disorders of bone density and structure, unspecified site: Secondary | ICD-10-CM | POA: Diagnosis not present

## 2021-10-02 DIAGNOSIS — D509 Iron deficiency anemia, unspecified: Secondary | ICD-10-CM | POA: Diagnosis not present

## 2021-10-02 LAB — CBC WITH DIFFERENTIAL/PLATELET
Abs Immature Granulocytes: 0.01 10*3/uL (ref 0.00–0.07)
Basophils Absolute: 0.1 10*3/uL (ref 0.0–0.1)
Basophils Relative: 2 %
Eosinophils Absolute: 0.1 10*3/uL (ref 0.0–0.5)
Eosinophils Relative: 3 %
HCT: 35.2 % — ABNORMAL LOW (ref 36.0–46.0)
Hemoglobin: 11.6 g/dL — ABNORMAL LOW (ref 12.0–15.0)
Immature Granulocytes: 0 %
Lymphocytes Relative: 45 %
Lymphs Abs: 1.8 10*3/uL (ref 0.7–4.0)
MCH: 32.1 pg (ref 26.0–34.0)
MCHC: 33 g/dL (ref 30.0–36.0)
MCV: 97.5 fL (ref 80.0–100.0)
Monocytes Absolute: 0.7 10*3/uL (ref 0.1–1.0)
Monocytes Relative: 17 %
Neutro Abs: 1.3 10*3/uL — ABNORMAL LOW (ref 1.7–7.7)
Neutrophils Relative %: 33 %
Platelets: 192 10*3/uL (ref 150–400)
RBC: 3.61 MIL/uL — ABNORMAL LOW (ref 3.87–5.11)
RDW: 15.3 % (ref 11.5–15.5)
WBC: 4 10*3/uL (ref 4.0–10.5)
nRBC: 0 % (ref 0.0–0.2)

## 2021-10-02 LAB — CMP (CANCER CENTER ONLY)
ALT: 18 U/L (ref 0–44)
AST: 18 U/L (ref 15–41)
Albumin: 4.1 g/dL (ref 3.5–5.0)
Alkaline Phosphatase: 84 U/L (ref 38–126)
Anion gap: 5 (ref 5–15)
BUN: 16 mg/dL (ref 8–23)
CO2: 25 mmol/L (ref 22–32)
Calcium: 9.3 mg/dL (ref 8.9–10.3)
Chloride: 112 mmol/L — ABNORMAL HIGH (ref 98–111)
Creatinine: 0.74 mg/dL (ref 0.44–1.00)
GFR, Estimated: >60 mL/min (ref >60)
Glucose, Bld: 95 mg/dL (ref 70–99)
Potassium: 3.9 mmol/L (ref 3.5–5.1)
Sodium: 142 mmol/L (ref 135–145)
Total Bilirubin: 0.4 mg/dL (ref 0.3–1.2)
Total Protein: 6.3 g/dL — ABNORMAL LOW (ref 6.5–8.1)

## 2021-10-02 NOTE — Progress Notes (Signed)
HEMATOLOGY/ONCOLOGY CLINIC NOTE ? ?[facility] 10/02/2021 ? ?Patient Care Team: ?White, Cynthia, MD as PCP - General (Family Medicine) ?Kale, Gautam Kishore, MD as Consulting Physician (Hematology) ? ?CHIEF COMPLAINTS/PURPOSE OF VISIT:  ?Follow-up for continued evaluation and management of multiple myeloma ? ?HISTORY OF PRESENTING ILLNESS: Please see previous note ? ?INTERVAL HISTORY ? ?Tracy Fisher is here for continued evaluation and management of her multiple myeloma. She reports She is doing well with no new symptoms or concerns. ? ?She notes that she has been experiencing some pain in her left wrist. ? ?She notes some discoloration in the interior part of her right foot. ? ?We discussed using supplement Palmitoylethanolamine OTC at about 400 to 800 mg p.o. daily to help with her neuropathy. ? ?We discussed seeing a dentist within the next 2 months to be cleared to be able to get a bone strengthening infusion every 3 months for 2 years which pt was agreeable to. ? ?No dental issues. ?No other new or acute focal symptoms. ? ?We discussed Bone Density done 09/22/2021 which revealed "The BMD measured at Femur Neck Left is 0.777 g/cm2 with a T-score of ?-1.9. This patient is considered osteopenic/low bone mass according ?to World Health Organization (WHO) criteria" ? ?We discussed starting Zometa in 2 months in the context of history of myeloma and osteopenia. ? ?We discussed labs done today. ?CBC stable and shows mild microcytic anemia with RBC 3.61, improved WBC of 4.0, improved hemoglobin at 11.6, decreased ANC at 1.3, and improved HCT at 35.2. ?CMP unremarkable. ?MM panel done 09/02/2021 shows M protein spike not observed. ? ?Oncology History  ?Smoldering multiple myeloma (SMM)  ?Multiple myeloma ([facility])  ?08/28/2020 Initial Diagnosis  ? Multiple myeloma ([facility]) ?  ?09/09/2020 - 10/07/2020 Chemotherapy  ?  ? ?  ? ?  ?01/20/2021 -  Chemotherapy  ? Patient is on Treatment Plan : MYELOMA NEWLY DIAGNOSED TRANSPLANT CANDIDATE  DaraVRd (Daratumumab IV) q21d x 6 Cycles (Induction/Consolidation)  ?   ? ? ?MEDICAL HISTORY:  ?Past Medical History:  ?Diagnosis Date  ? Bone cancer ([facility]) 2018  ? started chemo pill in  2018  ? Diabetes mellitus without complication ([facility])   ? Hypertension   ? Vocal cord cyst   ? ? ?SURGICAL HISTORY: ?No past surgical history on file. ? ?SOCIAL HISTORY: ?Social History  ? ?Socioeconomic History  ? Marital status: Divorced  ?  Spouse name: Not on file  ? Number of children: Not on file  ? Years of education: Not on file  ? Highest education level: Not on file  ?Occupational History  ? Not on file  ?Tobacco Use  ? Smoking status: Never  ? Smokeless tobacco: Never  ?Vaping Use  ? Vaping Use: Never used  ?Substance and Sexual Activity  ? Alcohol use: No  ? Drug use: Never  ? Sexual activity: Not on file  ?Other Topics Concern  ? Not on file  ?Social History Narrative  ? Not on file  ? ?Social Determinants of Health  ? ?Financial Resource Strain: Not on file  ?Food Insecurity: Not on file  ?Transportation Needs: Not on file  ?Physical Activity: Not on file  ?Stress: Not on file  ?Social Connections: Not on file  ?Intimate Partner Violence: Not on file  ? ? ?FAMILY HISTORY: ?Family History  ?Problem Relation Age of Onset  ? Breast cancer Neg Hx   ? ? ?ALLERGIES:  is allergic to bortezomib and sulfa antibiotics. ? ?MEDICATIONS:  ?Current Outpatient Medications  ?Medication Sig   Dispense Refill  ? acyclovir (ZOVIRAX) 400 MG tablet Take 1 tablet (400 mg total) by mouth 2 (two) times daily. 60 tablet 3  ? albuterol (VENTOLIN HFA) 108 (90 Base) MCG/ACT inhaler Inhale 1-2 puffs into the lungs every 6 (six) hours as needed for wheezing or shortness of breath. 8 g 1  ? amLODipine (NORVASC) 5 MG tablet     ? amoxicillin-clavulanate (AUGMENTIN) 875-125 MG tablet Take 1 tablet by mouth every 12 (twelve) hours. 14 tablet 0  ? aspirin EC 81 MG tablet Take 81 mg by mouth daily. Swallow whole.    ? atorvastatin (LIPITOR) 20 MG tablet      ? azithromycin (ZITHROMAX) 250 MG tablet 2 tabs (579m) on day 1 and then 1 tab (251m daily x 4 days 6 each 0  ? b complex vitamins capsule Take 1 capsule by mouth daily. 30 capsule 11  ? brimonidine (ALPHAGAN) 0.2 % ophthalmic solution Place 1 drop into both eyes 2 (two) times daily.   3  ? Calcium Carbonate-Vitamin D (CALCIUM + D PO) Take 1 tablet by mouth daily.    ? chlorhexidine (PERIDEX) 0.12 % solution Use as directed 15 mLs in the mouth or throat 2 (two) times daily. 120 mL 0  ? dorzolamide-timolol (COSOPT) 22.3-6.8 MG/ML ophthalmic solution Place 1 drop into both eyes 2 (two) times daily.   12  ? DULoxetine (CYMBALTA) 60 MG capsule Take 1 capsule (60 mg total) by mouth daily. 30 capsule 2  ? gabapentin (NEURONTIN) 300 MG capsule TAKE 1 CAPSULE BY MOUTH THREE TIMES DAILY 90 capsule 0  ? HYDROcodone-acetaminophen (NORCO) 5-325 MG tablet Take 1 tablet by mouth every 6 (six) hours as needed for moderate pain. 60 tablet 0  ? lenalidomide (REVLIMID) 10 MG capsule Take 1 capsule (10 mg total) by mouth daily. Take 21 days on, 7 days off, repeat every 28 days. 21 capsule 0  ? LUMIGAN 0.01 % SOLN INSTILL 1 DROP INTO EACH EYE ONCE DAILY    ? metoCLOPramide (REGLAN) 10 MG tablet Take 1 tablet (10 mg total) by mouth every 8 (eight) hours as needed for nausea or vomiting. (Patient not taking: No sig reported) 6 tablet 0  ? mirtazapine (REMERON) 15 MG tablet Take 15 mg by mouth at bedtime.    ? Multiple Vitamin (MULTIVITAMIN) capsule Take by mouth.    ? ondansetron (ZOFRAN) 4 MG tablet Take 1 tablet (4 mg total) by mouth every 6 (six) hours. 12 tablet 0  ? ONETOUCH VERIO test strip USE STRIP TO CHECK GLUCOSE TWICE DAILY    ? oseltamivir (TAMIFLU) 75 MG capsule Take 1 capsule (75 mg total) by mouth every 12 (twelve) hours. 10 capsule 0  ? Potassium Chloride ER 20 MEQ TBCR Take 1 tablet by mouth twice daily 60 tablet 0  ? ?No current facility-administered medications for this visit.  ? ? ?REVIEW OF SYSTEMS:   ?10 Point  review of Systems was done is negative except as noted above. ? ?PHYSICAL EXAMINATION: ?ECOG PERFORMANCE STATUS: 1 - Symptomatic but completely ambulatory ? ?There were no vitals filed for this visit. ? ?There were no vitals filed for this visit. ?NAD  ?GENERAL:alert, in no acute distress and comfortable ?SKIN: no acute rashes, no significant lesions ?EYES: conjunctiva are pink and non-injected, sclera anicteric ?NECK: supple, no JVD ?LYMPH:  no palpable lymphadenopathy in the cervical, axillary or inguinal regions ?LUNGS: clear to auscultation b/l with normal respiratory effort ?HEART: regular rate & rhythm ?ABDOMEN:  normoactive bowel sounds ,  non tender, not distended. ?Extremity: no pedal edema ?PSYCH: alert & oriented x 3 with fluent speech ?NEURO: no focal motor/sensory deficits ? ?LABORATORY DATA:  ? ? ?  Latest Ref Rng & Units 09/02/2021  ?  9:42 AM 08/10/2021  ?  1:45 PM 07/28/2021  ?  9:07 AM  ?CBC  ?WBC 4.0 - 10.5 K/uL 3.8   2.8   2.4    ?Hemoglobin 12.0 - 15.0 g/dL 11.1   11.4   12.1    ?Hematocrit 36.0 - 46.0 % 34.6   34.4   35.8    ?Platelets 150 - 400 K/uL 208   110   192    ? ?. ? ?  Latest Ref Rng & Units 09/02/2021  ?  9:42 AM 08/10/2021  ?  1:45 PM 07/28/2021  ?  9:07 AM  ?CMP  ?Glucose 70 - 99 mg/dL 106   92   110    ?BUN 8 - 23 mg/dL 11   12   11    ?Creatinine 0.44 - 1.00 mg/dL 0.68   0.83   0.76    ?Sodium 135 - 145 mmol/L 142   138   141    ?Potassium 3.5 - 5.1 mmol/L 3.8   4.1   3.9    ?Chloride 98 - 111 mmol/L 112   105   109    ?CO2 22 - 32 mmol/L 27   23   25    ?Calcium 8.9 - 10.3 mg/dL 9.5   8.9   9.3    ?Total Protein 6.5 - 8.1 g/dL 6.3    6.3    ?Total Bilirubin 0.3 - 1.2 mg/dL 0.4    0.5    ?Alkaline Phos 38 - 126 U/L 71    71    ?AST 15 - 41 U/L 16    18    ?ALT 0 - 44 U/L 15    20    ? ? ?RADIOGRAPHIC STUDIES: ?I have personally reviewed the radiological images as listed and agreed with the findings in the report. ?DG Chest 2 View ? ?Result Date: 09/02/2021 ?CLINICAL DATA:  History of  myeloma, influenza, right lower chest pressure. Rule out bacterial pneumonia. EXAM: CHEST - 2 VIEW COMPARISON:  Chest radiograph 08/10/2021 FINDINGS: The cardiomediastinal silhouette is normal. There is no foca

## 2021-10-06 ENCOUNTER — Telehealth: Payer: Self-pay | Admitting: Hematology

## 2021-10-06 NOTE — Telephone Encounter (Signed)
Left message with follow-up appointment per 4/6 los. ?

## 2021-10-09 ENCOUNTER — Encounter: Payer: Self-pay | Admitting: Hematology

## 2021-10-14 ENCOUNTER — Other Ambulatory Visit: Payer: Self-pay

## 2021-10-14 DIAGNOSIS — C9000 Multiple myeloma not having achieved remission: Secondary | ICD-10-CM

## 2021-10-14 MED ORDER — LENALIDOMIDE 10 MG PO CAPS: 10.0000 mg | ORAL_CAPSULE | Freq: Every day | ORAL | 0 refills | Status: DC

## 2021-10-15 DIAGNOSIS — R058 Other specified cough: Secondary | ICD-10-CM | POA: Diagnosis not present

## 2021-10-16 DIAGNOSIS — M654 Radial styloid tenosynovitis [de Quervain]: Secondary | ICD-10-CM | POA: Diagnosis not present

## 2021-10-29 ENCOUNTER — Other Ambulatory Visit: Payer: Self-pay | Admitting: Hematology

## 2021-10-29 DIAGNOSIS — C9000 Multiple myeloma not having achieved remission: Secondary | ICD-10-CM

## 2021-10-30 DIAGNOSIS — Z961 Presence of intraocular lens: Secondary | ICD-10-CM | POA: Diagnosis not present

## 2021-10-30 DIAGNOSIS — H401123 Primary open-angle glaucoma, left eye, severe stage: Secondary | ICD-10-CM | POA: Diagnosis not present

## 2021-10-30 DIAGNOSIS — H401111 Primary open-angle glaucoma, right eye, mild stage: Secondary | ICD-10-CM | POA: Diagnosis not present

## 2021-11-11 DIAGNOSIS — M1712 Unilateral primary osteoarthritis, left knee: Secondary | ICD-10-CM | POA: Diagnosis not present

## 2021-11-11 DIAGNOSIS — M25562 Pain in left knee: Secondary | ICD-10-CM | POA: Diagnosis not present

## 2021-11-13 ENCOUNTER — Telehealth: Payer: Self-pay

## 2021-11-13 NOTE — Telephone Encounter (Signed)
Pt called regarding dental clearance for zometa infusion. Dental clearance form sent to pt dentist, [facility], via fax and per pt request a copy of dental clearance form was mailed to home address, confirmed by pt. No further questions or concerns at this time.

## 2021-11-14 ENCOUNTER — Other Ambulatory Visit: Payer: Self-pay | Admitting: Hematology

## 2021-11-14 DIAGNOSIS — C9000 Multiple myeloma not having achieved remission: Secondary | ICD-10-CM

## 2021-11-17 ENCOUNTER — Other Ambulatory Visit: Payer: Self-pay

## 2021-11-17 ENCOUNTER — Encounter: Payer: Self-pay | Admitting: Hematology

## 2021-11-17 DIAGNOSIS — C9000 Multiple myeloma not having achieved remission: Secondary | ICD-10-CM

## 2021-11-17 MED ORDER — LENALIDOMIDE 10 MG PO CAPS: 10.0000 mg | ORAL_CAPSULE | Freq: Every day | ORAL | 0 refills | Status: DC

## 2021-11-26 ENCOUNTER — Other Ambulatory Visit: Payer: Self-pay | Admitting: Hematology

## 2021-11-26 DIAGNOSIS — C9000 Multiple myeloma not having achieved remission: Secondary | ICD-10-CM

## 2021-12-01 DIAGNOSIS — I700 Atherosclerosis of aorta: Secondary | ICD-10-CM | POA: Diagnosis not present

## 2021-12-01 DIAGNOSIS — B009 Herpesviral infection, unspecified: Secondary | ICD-10-CM | POA: Diagnosis not present

## 2021-12-01 DIAGNOSIS — E785 Hyperlipidemia, unspecified: Secondary | ICD-10-CM | POA: Diagnosis not present

## 2021-12-01 DIAGNOSIS — Z79891 Long term (current) use of opiate analgesic: Secondary | ICD-10-CM | POA: Diagnosis not present

## 2021-12-01 DIAGNOSIS — C9000 Multiple myeloma not having achieved remission: Secondary | ICD-10-CM | POA: Diagnosis not present

## 2021-12-01 DIAGNOSIS — M545 Low back pain, unspecified: Secondary | ICD-10-CM | POA: Diagnosis not present

## 2021-12-01 DIAGNOSIS — E1142 Type 2 diabetes mellitus with diabetic polyneuropathy: Secondary | ICD-10-CM | POA: Diagnosis not present

## 2021-12-01 DIAGNOSIS — Z7961 Long term (current) use of immunomodulator: Secondary | ICD-10-CM | POA: Diagnosis not present

## 2021-12-01 DIAGNOSIS — H409 Unspecified glaucoma: Secondary | ICD-10-CM | POA: Diagnosis not present

## 2021-12-01 DIAGNOSIS — R69 Illness, unspecified: Secondary | ICD-10-CM | POA: Diagnosis not present

## 2021-12-01 DIAGNOSIS — I10 Essential (primary) hypertension: Secondary | ICD-10-CM | POA: Diagnosis not present

## 2021-12-01 DIAGNOSIS — G4700 Insomnia, unspecified: Secondary | ICD-10-CM | POA: Diagnosis not present

## 2021-12-03 ENCOUNTER — Other Ambulatory Visit: Payer: Self-pay | Admitting: Oncology

## 2021-12-04 ENCOUNTER — Other Ambulatory Visit: Payer: Self-pay

## 2021-12-04 DIAGNOSIS — C9001 Multiple myeloma in remission: Secondary | ICD-10-CM

## 2021-12-05 ENCOUNTER — Other Ambulatory Visit: Payer: Self-pay

## 2021-12-05 ENCOUNTER — Inpatient Hospital Stay: Payer: Medicare HMO

## 2021-12-05 ENCOUNTER — Inpatient Hospital Stay: Payer: Medicare HMO | Attending: Hematology | Admitting: Hematology

## 2021-12-05 VITALS — BP 149/90 | HR 66 | Temp 97.3°F | Resp 17 | Wt 154.0 lb

## 2021-12-05 DIAGNOSIS — Z7961 Long term (current) use of immunomodulator: Secondary | ICD-10-CM | POA: Diagnosis not present

## 2021-12-05 DIAGNOSIS — Z79899 Other long term (current) drug therapy: Secondary | ICD-10-CM | POA: Insufficient documentation

## 2021-12-05 DIAGNOSIS — M8580 Other specified disorders of bone density and structure, unspecified site: Secondary | ICD-10-CM | POA: Diagnosis not present

## 2021-12-05 DIAGNOSIS — C9000 Multiple myeloma not having achieved remission: Secondary | ICD-10-CM

## 2021-12-05 DIAGNOSIS — D649 Anemia, unspecified: Secondary | ICD-10-CM | POA: Diagnosis not present

## 2021-12-05 DIAGNOSIS — C9001 Multiple myeloma in remission: Secondary | ICD-10-CM

## 2021-12-05 DIAGNOSIS — Z5111 Encounter for antineoplastic chemotherapy: Secondary | ICD-10-CM | POA: Diagnosis not present

## 2021-12-05 DIAGNOSIS — D72819 Decreased white blood cell count, unspecified: Secondary | ICD-10-CM | POA: Insufficient documentation

## 2021-12-05 DIAGNOSIS — Z7982 Long term (current) use of aspirin: Secondary | ICD-10-CM | POA: Diagnosis not present

## 2021-12-05 LAB — CMP (CANCER CENTER ONLY)
ALT: 20 U/L (ref 0–44)
AST: 18 U/L (ref 15–41)
Albumin: 4.2 g/dL (ref 3.5–5.0)
Alkaline Phosphatase: 79 U/L (ref 38–126)
Anion gap: 5 (ref 5–15)
BUN: 17 mg/dL (ref 8–23)
CO2: 24 mmol/L (ref 22–32)
Calcium: 9.2 mg/dL (ref 8.9–10.3)
Chloride: 109 mmol/L (ref 98–111)
Creatinine: 0.72 mg/dL (ref 0.44–1.00)
GFR, Estimated: >60 mL/min (ref >60)
Glucose, Bld: 85 mg/dL (ref 70–99)
Potassium: 3.9 mmol/L (ref 3.5–5.1)
Sodium: 138 mmol/L (ref 135–145)
Total Bilirubin: 0.3 mg/dL (ref 0.3–1.2)
Total Protein: 6.2 g/dL — ABNORMAL LOW (ref 6.5–8.1)

## 2021-12-05 LAB — CBC WITH DIFFERENTIAL/PLATELET
Abs Immature Granulocytes: 0 10*3/uL (ref 0.00–0.07)
Basophils Absolute: 0.1 10*3/uL (ref 0.0–0.1)
Basophils Relative: 2 %
Eosinophils Absolute: 0.2 10*3/uL (ref 0.0–0.5)
Eosinophils Relative: 6 %
HCT: 36.5 % (ref 36.0–46.0)
Hemoglobin: 12.3 g/dL (ref 12.0–15.0)
Immature Granulocytes: 0 %
Lymphocytes Relative: 47 %
Lymphs Abs: 1.3 10*3/uL (ref 0.7–4.0)
MCH: 32.4 pg (ref 26.0–34.0)
MCHC: 33.7 g/dL (ref 30.0–36.0)
MCV: 96.1 fL (ref 80.0–100.0)
Monocytes Absolute: 0.5 10*3/uL (ref 0.1–1.0)
Monocytes Relative: 17 %
Neutro Abs: 0.8 10*3/uL — ABNORMAL LOW (ref 1.7–7.7)
Neutrophils Relative %: 28 %
Platelets: 142 10*3/uL — ABNORMAL LOW (ref 150–400)
RBC: 3.8 MIL/uL — ABNORMAL LOW (ref 3.87–5.11)
RDW: 14.7 % (ref 11.5–15.5)
WBC: 2.9 10*3/uL — ABNORMAL LOW (ref 4.0–10.5)
nRBC: 0 % (ref 0.0–0.2)

## 2021-12-05 MED ORDER — AMOXICILLIN 500 MG PO CAPS
2000.0000 mg | ORAL_CAPSULE | Freq: Once | ORAL | 0 refills | Status: AC
Start: 2021-12-05 — End: 2021-12-05

## 2021-12-05 NOTE — Progress Notes (Signed)
HEMATOLOGY/ONCOLOGY CLINIC NOTE  DOS  12/05/2021   Patient Care Team: Harlan Stains, MD as PCP - General (Family Medicine) Brunetta Genera, MD as Consulting Physician (Hematology)  CHIEF COMPLAINTS/PURPOSE OF VISIT:  Follow-up for continued evaluation management of multiple myeloma  HISTORY OF PRESENTING ILLNESS: Please see previous note  Tracy Fisher Tracy Fisher is here for continued evaluation and management of her multiple myeloma. She notes that she has been doing well and has no acute new focal symptoms. No new focal bone pains. Grade 1 residual neuropathy. Mild grade 1 fatigue which is not limiting her daily activities. No other significant toxicities from her current dose of maintenance Revlimid. She does note that she is having a dental issue with one of her teeth and will need a dental extraction coming up. We discussed that we shall hold off on initiating her Zometa at this time. Labs done today were reviewed with her in detail.   Oncology History  Smoldering multiple myeloma (SMM)  Multiple myeloma ([place])  08/28/2020 Initial Diagnosis   Multiple myeloma ([employer])   09/09/2020 - 10/07/2020 Chemotherapy         01/20/2021 - 07/07/2021 Chemotherapy   Patient is on Treatment Plan : MYELOMA NEWLY DIAGNOSED TRANSPLANT CANDIDATE DaraVRd (Daratumumab IV) q21d x 6 Cycles (Induction/Consolidation)       MEDICAL HISTORY:  Past Medical History:  Diagnosis Date   Bone cancer ([place]) 2018   started chemo pill in  2018   Diabetes mellitus without complication ([place])    Hypertension    Vocal cord cyst     SURGICAL HISTORY: No past surgical history on file.  SOCIAL HISTORY: Social History   Socioeconomic History   Marital status: Divorced    Spouse name: Not on file   Number of children: Not on file   Years of education: Not on file   Highest education level: Not on file  Occupational History   Not on file  Tobacco Use   Smoking status: Never    Smokeless tobacco: Never  Vaping Use   Vaping Use: Never used  Substance and Sexual Activity   Alcohol use: No   Drug use: Never   Sexual activity: Not on file  Other Topics Concern   Not on file  Social History Narrative   Not on file   Social Determinants of Health   Financial Resource Strain: Not on file  Food Insecurity: Not on file  Transportation Needs: Not on file  Physical Activity: Not on file  Stress: Not on file  Social Connections: Not on file  Intimate Partner Violence: Not on file    FAMILY HISTORY: Family History  Problem Relation Age of Onset   Breast cancer Neg Hx     ALLERGIES:  is allergic to bortezomib and sulfa antibiotics.  MEDICATIONS:  Current Outpatient Medications  Medication Sig Dispense Refill   acyclovir (ZOVIRAX) 400 MG tablet Take 1 tablet (400 mg total) by mouth 2 (two) times daily. 60 tablet 3   albuterol (VENTOLIN HFA) 108 (90 Base) MCG/ACT inhaler Inhale 1-2 puffs into the lungs every 6 (six) hours as needed for wheezing or shortness of breath. 8 g 1   amLODipine (NORVASC) 5 MG tablet      amoxicillin-clavulanate (AUGMENTIN) 875-125 MG tablet Take 1 tablet by mouth every 12 (twelve) hours. 14 tablet 0   aspirin EC 81 MG tablet Take 81 mg by mouth daily. Swallow whole.     atorvastatin (LIPITOR) 20 MG tablet  azithromycin (ZITHROMAX) 250 MG tablet 2 tabs (530m) on day 1 and then 1 tab (2529m daily x 4 days 6 each 0   b complex vitamins capsule Take 1 capsule by mouth daily. 30 capsule 11   brimonidine (ALPHAGAN) 0.2 % ophthalmic solution Place 1 drop into both eyes 2 (two) times daily.   3   Calcium Carbonate-Vitamin D (CALCIUM + D PO) Take 1 tablet by mouth daily.     chlorhexidine (PERIDEX) 0.12 % solution Use as directed 15 mLs in the mouth or throat 2 (two) times daily. 120 mL 0   dorzolamide-timolol (COSOPT) 22.3-6.8 MG/ML ophthalmic solution Place 1 drop into both eyes 2 (two) times daily.   12   DULoxetine (CYMBALTA) 60 MG  capsule Take 1 capsule by mouth once daily 30 capsule 0   gabapentin (NEURONTIN) 300 MG capsule TAKE 1 CAPSULE BY MOUTH THREE TIMES DAILY 90 capsule 0   HYDROcodone-acetaminophen (NORCO) 5-325 MG tablet Take 1 tablet by mouth every 6 (six) hours as needed for moderate pain. 60 tablet 0   lenalidomide (REVLIMID) 10 MG capsule Take 1 capsule (10 mg total) by mouth daily. Take 21 days on, 7 days off, repeat every 28 days. 21 capsule 0   LUMIGAN 0.01 % SOLN INSTILL 1 DROP INTO EACH EYE ONCE DAILY     metoCLOPramide (REGLAN) 10 MG tablet Take 1 tablet (10 mg total) by mouth every 8 (eight) hours as needed for nausea or vomiting. (Patient not taking: No sig reported) 6 tablet 0   mirtazapine (REMERON) 15 MG tablet Take 15 mg by mouth at bedtime.     Multiple Vitamin (MULTIVITAMIN) capsule Take by mouth.     ondansetron (ZOFRAN) 4 MG tablet Take 1 tablet (4 mg total) by mouth every 6 (six) hours. 12 tablet 0   ONETOUCH VERIO test strip USE STRIP TO CHECK GLUCOSE TWICE DAILY     oseltamivir (TAMIFLU) 75 MG capsule Take 1 capsule (75 mg total) by mouth every 12 (twelve) hours. 10 capsule 0   Potassium Chloride ER 20 MEQ TBCR Take 1 tablet by mouth twice daily 60 tablet 0   No current facility-administered medications for this visit.    REVIEW OF SYSTEMS:   10 Point review of Systems was done is negative except as noted above. PHYSICAL EXAMINATION: ECOG PERFORMANCE STATUS: 1 - Symptomatic but completely ambulatory  Vitals:   12/05/21 0957  BP: (!) 149/90  Pulse: 66  Resp: 17  Temp: (!) 97.3 F (36.3 C)  SpO2: 96%    Filed Weights   12/05/21 0957  Weight: 154 lb (69.9 kg)  NAD GENERAL:alert, in no acute distress and comfortable SKIN: no acute rashes, no significant lesions EYES: conjunctiva are pink and non-injected, sclera anicteric OROPHARYNX: MMM, no exudates, no oropharyngeal erythema or ulceration NECK: supple, no JVD LYMPH:  no palpable lymphadenopathy in the cervical, axillary or  inguinal regions LUNGS: clear to auscultation b/l with normal respiratory effort HEART: regular rate & rhythm ABDOMEN:  normoactive bowel sounds , non tender, not distended. Extremity: no pedal edema PSYCH: alert & oriented x 3 with fluent speech NEURO: no focal motor/sensory deficits  LABORATORY DATA:      Latest Ref Rng & Units 12/05/2021    9:03 AM 10/02/2021    9:07 AM 09/02/2021    9:42 AM  CBC  WBC 4.0 - 10.5 K/uL 2.9  4.0  3.8   Hemoglobin 12.0 - 15.0 g/dL 12.3  11.6  11.1   Hematocrit 36.0 -  46.0 % 36.5  35.2  34.6   Platelets 150 - 400 K/uL 142  192  208    .    Latest Ref Rng & Units 12/05/2021    9:03 AM 10/02/2021    9:07 AM 09/02/2021    9:42 AM  CMP  Glucose 70 - 99 mg/dL 85  95  106   BUN 8 - 23 mg/dL '17  16  11   ' Creatinine 0.44 - 1.00 mg/dL 0.72  0.74  0.68   Sodium 135 - 145 mmol/L 138  142  142   Potassium 3.5 - 5.1 mmol/L 3.9  3.9  3.8   Chloride 98 - 111 mmol/L 109  112  112   CO2 22 - 32 mmol/L '24  25  27   ' Calcium 8.9 - 10.3 mg/dL 9.2  9.3  9.5   Total Protein 6.5 - 8.1 g/dL 6.2  6.3  6.3   Total Bilirubin 0.3 - 1.2 mg/dL 0.3  0.4  0.4   Alkaline Phos 38 - 126 U/L 79  84  71   AST 15 - 41 U/L '18  18  16   ' ALT 0 - 44 U/L '20  18  15     ' RADIOGRAPHIC STUDIES: I have personally reviewed the radiological images as listed and agreed with the findings in the report. No results found.  ASSESSMENT & PLAN:   76 y.o. female here for follow-up of her multiple myeloma and next treatment   #1 IgG kappa multiple myeloma.   Initially presented as smoldering myeloma and had M spike of 1.9 g/dL which was noted to be IgG kappa on immunofixation electrophoresis. -Bone survey shows no overt evidence of osseous involvement suggestive of multiple myeloma. Beta 2 microglobulin, sedimentation rate an LDH levels are within normal limits. -Bone marrow biopsy shows previously showed 17% monoclonal plasma cells Cytogenetics/MolCy - trisomy 16 (consistent with myeloma)   -Bone  study from 09/27/17 showed some osteopenic changes without osteoporosis. Previous bone studies are not yet available  -09/08/2019 FISH Panel revealed "No Mutations Detected". -09/08/2019 Cytogenetics revealed "Normal female karyotype". -09/08/2019 BM Bx Report ([id number])  revealed "BONE MARROW, ASPIRATE, CLOT, CORE: -Hypercellular bone marrow for age with plasma cell neoplasm PERIPHERAL BLOOD: -Macrocytic anemia -Leukopenia." - 20% Plasma Cells  -09/15/2019 PET/CT ([id number]) revealed "1. No findings of active myeloma or other hypermetabolic malignancy. 2.  Aortic Atherosclerosis (ICD10-I70.0). 3. Photopenic hypodense hepatic lesions most compatible with cysts."   A treatment initiation active Multiple myeloma Bone Marrow Biopsy with nearly 60% plasma cells. With M spike of 3.6g/dl Starting to develop mild anemia (does not meet anemia criteria yet) No hypercalcemia, renal insufficiency  PET/CT 07/31/2020 - No abnormal hypermetabolism in the neck, chest, abdomen or pelvis.  -PET CT done 07/18/2021 - No hypermetabolic bone lesions suggestive of multiple myeloma.   #2  Chronic leukopenia with intermittent neutropenia. -B12 and folate levels within normal limits. -Reasonable to take a daily vitamin B complex tablet. -No indication for G-CSF at this time. -Now also additionally possibly due to Revlimid.   #3.  Mild anemia and leukopenia likely related to myeloma and Revlimid.  #4 recent influenza A infection in February 2023 -Patient's influenza A URI has resolved.  She was given Augmentin for secondary bacterial infection by her primary care physician.  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 CMP within normal limits with normal creatinine and normal calcium levels Myeloma panel shows no monoclonal protein spike Serum kappa lambda free light chains within normal limits Patient notes no significant new toxicities from her Revlimid at this time. We shall continue the maintenance Revlimid at 10 mg p.o. daily 3 weeks on 1 week off. -Patient needs a dental extraction and we should hold off on starting her maintenance Zometa at this time. -Given prescription for amoxicillin for dental prophylaxis    FOLLOW UP: Holding zometa for upcoming dental extraction RTC with Dr Irene Limbo with labs in 2 months with appointment for Zometa  The total time spent in the appointment was 25 minutes*.  All of the patient's questions were answered with apparent satisfaction. The patient knows to call the clinic with any problems, questions or concerns.   Sullivan Lone MD MS AAHIVMS [facility] Hematology/Oncology Physician [facility]  .*Total Encounter Time as defined by the Centers for Medicare and Medicaid Services includes, in addition to the face-to-face time of a patient visit (documented in the note above) non-face-to-face time: obtaining and reviewing outside history, ordering and reviewing medications, tests or procedures, care coordination (communications with other health care professionals or caregivers) and documentation in the medical record.

## 2021-12-08 ENCOUNTER — Telehealth: Payer: Self-pay | Admitting: Hematology

## 2021-12-08 LAB — KAPPA/LAMBDA LIGHT CHAINS
Kappa free light chain: 9.2 mg/L (ref 3.3–19.4)
Kappa, lambda light chain ratio: 2.49 — ABNORMAL HIGH (ref 0.26–1.65)
Lambda free light chains: 3.7 mg/L — ABNORMAL LOW (ref 5.7–26.3)

## 2021-12-08 NOTE — Telephone Encounter (Signed)
Scheduled follow-up appointment per 6/9 los. Patient is aware.

## 2021-12-09 LAB — MULTIPLE MYELOMA PANEL, SERUM
Albumin SerPl Elph-Mcnc: 3.6 g/dL (ref 2.9–4.4)
Albumin/Glob SerPl: 1.8 — ABNORMAL HIGH (ref 0.7–1.7)
Alpha 1: 0.2 g/dL (ref 0.0–0.4)
Alpha2 Glob SerPl Elph-Mcnc: 0.7 g/dL (ref 0.4–1.0)
B-Globulin SerPl Elph-Mcnc: 0.8 g/dL (ref 0.7–1.3)
Gamma Glob SerPl Elph-Mcnc: 0.4 g/dL (ref 0.4–1.8)
Globulin, Total: 2.1 g/dL — ABNORMAL LOW (ref 2.2–3.9)
IgA: 17 mg/dL — ABNORMAL LOW (ref 64–422)
IgG (Immunoglobin G), Serum: 430 mg/dL — ABNORMAL LOW (ref 586–1602)
IgM (Immunoglobulin M), Srm: 15 mg/dL — ABNORMAL LOW (ref 26–217)
Total Protein ELP: 5.7 g/dL — ABNORMAL LOW (ref 6.0–8.5)

## 2021-12-10 ENCOUNTER — Other Ambulatory Visit: Payer: Self-pay

## 2021-12-10 DIAGNOSIS — C9000 Multiple myeloma not having achieved remission: Secondary | ICD-10-CM

## 2021-12-10 MED ORDER — LENALIDOMIDE 10 MG PO CAPS: 10.0000 mg | ORAL_CAPSULE | Freq: Every day | ORAL | 0 refills | Status: DC

## 2021-12-18 ENCOUNTER — Other Ambulatory Visit: Payer: Self-pay | Admitting: Hematology

## 2021-12-18 DIAGNOSIS — C9000 Multiple myeloma not having achieved remission: Secondary | ICD-10-CM

## 2021-12-25 ENCOUNTER — Other Ambulatory Visit: Payer: Self-pay | Admitting: Hematology

## 2021-12-25 DIAGNOSIS — C9000 Multiple myeloma not having achieved remission: Secondary | ICD-10-CM

## 2021-12-31 ENCOUNTER — Other Ambulatory Visit: Payer: Self-pay | Admitting: Hematology

## 2021-12-31 DIAGNOSIS — C9000 Multiple myeloma not having achieved remission: Secondary | ICD-10-CM

## 2022-01-02 ENCOUNTER — Other Ambulatory Visit: Payer: Self-pay | Admitting: Family Medicine

## 2022-01-02 DIAGNOSIS — Z1231 Encounter for screening mammogram for malignant neoplasm of breast: Secondary | ICD-10-CM

## 2022-01-08 DIAGNOSIS — I10 Essential (primary) hypertension: Secondary | ICD-10-CM | POA: Diagnosis not present

## 2022-01-08 DIAGNOSIS — E1169 Type 2 diabetes mellitus with other specified complication: Secondary | ICD-10-CM | POA: Diagnosis not present

## 2022-01-08 DIAGNOSIS — R69 Illness, unspecified: Secondary | ICD-10-CM | POA: Diagnosis not present

## 2022-01-08 DIAGNOSIS — G620 Drug-induced polyneuropathy: Secondary | ICD-10-CM | POA: Diagnosis not present

## 2022-01-08 DIAGNOSIS — E785 Hyperlipidemia, unspecified: Secondary | ICD-10-CM | POA: Diagnosis not present

## 2022-01-08 DIAGNOSIS — C9001 Multiple myeloma in remission: Secondary | ICD-10-CM | POA: Diagnosis not present

## 2022-01-09 ENCOUNTER — Other Ambulatory Visit: Payer: Self-pay

## 2022-01-09 DIAGNOSIS — C9000 Multiple myeloma not having achieved remission: Secondary | ICD-10-CM

## 2022-01-09 MED ORDER — LENALIDOMIDE 10 MG PO CAPS: 10.0000 mg | ORAL_CAPSULE | Freq: Every day | ORAL | 0 refills | Status: DC

## 2022-01-15 DIAGNOSIS — J40 Bronchitis, not specified as acute or chronic: Secondary | ICD-10-CM | POA: Diagnosis not present

## 2022-01-16 ENCOUNTER — Ambulatory Visit
Admission: RE | Admit: 2022-01-16 | Discharge: 2022-01-16 | Disposition: A | Discharge status: Home / Self Care | Payer: Medicare HMO | Source: Ambulatory Visit | Attending: Family Medicine | Admitting: Family Medicine

## 2022-01-16 ENCOUNTER — Other Ambulatory Visit: Payer: Self-pay

## 2022-01-16 DIAGNOSIS — Z1231 Encounter for screening mammogram for malignant neoplasm of breast: Secondary | ICD-10-CM

## 2022-01-16 DIAGNOSIS — C9000 Multiple myeloma not having achieved remission: Secondary | ICD-10-CM

## 2022-01-16 MED ORDER — POTASSIUM CHLORIDE ER 20 MEQ PO TBCR: 1.0000 | EXTENDED_RELEASE_TABLET | Freq: Two times a day (BID) | ORAL | 0 refills | Status: DC

## 2022-01-16 MED ORDER — ACYCLOVIR 400 MG PO TABS: 400.0000 mg | ORAL_TABLET | Freq: Two times a day (BID) | ORAL | 3 refills | Status: DC

## 2022-01-20 ENCOUNTER — Other Ambulatory Visit: Payer: Self-pay | Admitting: Hematology

## 2022-01-20 DIAGNOSIS — C9000 Multiple myeloma not having achieved remission: Secondary | ICD-10-CM

## 2022-01-31 ENCOUNTER — Other Ambulatory Visit: Payer: Self-pay | Admitting: Hematology

## 2022-01-31 DIAGNOSIS — C9000 Multiple myeloma not having achieved remission: Secondary | ICD-10-CM

## 2022-02-02 ENCOUNTER — Other Ambulatory Visit: Payer: Self-pay

## 2022-02-02 DIAGNOSIS — C9000 Multiple myeloma not having achieved remission: Secondary | ICD-10-CM

## 2022-02-03 ENCOUNTER — Other Ambulatory Visit: Payer: Self-pay

## 2022-02-03 DIAGNOSIS — C9000 Multiple myeloma not having achieved remission: Secondary | ICD-10-CM

## 2022-02-03 MED ORDER — HYDROCODONE-ACETAMINOPHEN 5-325 MG PO TABS: 1.0000 | ORAL_TABLET | Freq: Four times a day (QID) | ORAL | 0 refills | Status: DC | PRN

## 2022-02-03 MED ORDER — LENALIDOMIDE 10 MG PO CAPS: 10.0000 mg | ORAL_CAPSULE | Freq: Every day | ORAL | 0 refills | Status: DC

## 2022-02-04 ENCOUNTER — Inpatient Hospital Stay: Payer: Medicare HMO

## 2022-02-04 ENCOUNTER — Telehealth: Payer: Self-pay | Admitting: *Deleted

## 2022-02-04 ENCOUNTER — Inpatient Hospital Stay: Payer: Medicare HMO | Attending: Hematology | Admitting: Hematology

## 2022-02-04 ENCOUNTER — Other Ambulatory Visit: Payer: Self-pay

## 2022-02-04 VITALS — BP 123/94 | HR 62 | Temp 97.5°F | Resp 18 | Wt 154.9 lb

## 2022-02-04 DIAGNOSIS — C9000 Multiple myeloma not having achieved remission: Secondary | ICD-10-CM | POA: Diagnosis not present

## 2022-02-04 DIAGNOSIS — D649 Anemia, unspecified: Secondary | ICD-10-CM | POA: Insufficient documentation

## 2022-02-04 DIAGNOSIS — C9001 Multiple myeloma in remission: Secondary | ICD-10-CM | POA: Diagnosis not present

## 2022-02-04 DIAGNOSIS — Z7982 Long term (current) use of aspirin: Secondary | ICD-10-CM | POA: Diagnosis not present

## 2022-02-04 DIAGNOSIS — Z5111 Encounter for antineoplastic chemotherapy: Secondary | ICD-10-CM

## 2022-02-04 DIAGNOSIS — D72819 Decreased white blood cell count, unspecified: Secondary | ICD-10-CM | POA: Diagnosis not present

## 2022-02-04 DIAGNOSIS — Z79899 Other long term (current) drug therapy: Secondary | ICD-10-CM | POA: Diagnosis not present

## 2022-02-04 DIAGNOSIS — Z79624 Long term (current) use of inhibitors of nucleotide synthesis: Secondary | ICD-10-CM | POA: Insufficient documentation

## 2022-02-04 LAB — CMP (CANCER CENTER ONLY)
ALT: 18 U/L (ref 0–44)
AST: 17 U/L (ref 15–41)
Albumin: 4 g/dL (ref 3.5–5.0)
Alkaline Phosphatase: 77 U/L (ref 38–126)
Anion gap: 4 — ABNORMAL LOW (ref 5–15)
BUN: 12 mg/dL (ref 8–23)
CO2: 26 mmol/L (ref 22–32)
Calcium: 9.1 mg/dL (ref 8.9–10.3)
Chloride: 111 mmol/L (ref 98–111)
Creatinine: 0.7 mg/dL (ref 0.44–1.00)
GFR, Estimated: >60 mL/min (ref >60)
Glucose, Bld: 108 mg/dL — ABNORMAL HIGH (ref 70–99)
Potassium: 3.9 mmol/L (ref 3.5–5.1)
Sodium: 141 mmol/L (ref 135–145)
Total Bilirubin: 0.4 mg/dL (ref 0.3–1.2)
Total Protein: 6 g/dL — ABNORMAL LOW (ref 6.5–8.1)

## 2022-02-04 LAB — CBC WITH DIFFERENTIAL/PLATELET
Abs Immature Granulocytes: 0.01 10*3/uL (ref 0.00–0.07)
Basophils Absolute: 0.1 10*3/uL (ref 0.0–0.1)
Basophils Relative: 2 %
Eosinophils Absolute: 0.3 10*3/uL (ref 0.0–0.5)
Eosinophils Relative: 8 %
HCT: 36 % (ref 36.0–46.0)
Hemoglobin: 12.5 g/dL (ref 12.0–15.0)
Immature Granulocytes: 0 %
Lymphocytes Relative: 46 %
Lymphs Abs: 1.6 10*3/uL (ref 0.7–4.0)
MCH: 32.3 pg (ref 26.0–34.0)
MCHC: 34.7 g/dL (ref 30.0–36.0)
MCV: 93 fL (ref 80.0–100.0)
Monocytes Absolute: 0.6 10*3/uL (ref 0.1–1.0)
Monocytes Relative: 19 %
Neutro Abs: 0.9 10*3/uL — ABNORMAL LOW (ref 1.7–7.7)
Neutrophils Relative %: 25 %
Platelets: 147 10*3/uL — ABNORMAL LOW (ref 150–400)
RBC: 3.87 MIL/uL (ref 3.87–5.11)
RDW: 14.8 % (ref 11.5–15.5)
WBC: 3.5 10*3/uL — ABNORMAL LOW (ref 4.0–10.5)
nRBC: 0 % (ref 0.0–0.2)

## 2022-02-04 NOTE — Telephone Encounter (Signed)
LM for Dr Ander Purpura McNeil's office requesting dental clearance for Zometa. Pt had extraction ~ 2 months ago at oral surgeon. Next appt with Dr Manfred Arch is January 2024.

## 2022-02-05 ENCOUNTER — Telehealth: Payer: Self-pay | Admitting: *Deleted

## 2022-02-05 ENCOUNTER — Encounter: Payer: Self-pay | Admitting: *Deleted

## 2022-02-05 ENCOUNTER — Telehealth: Payer: Self-pay | Admitting: Hematology

## 2022-02-05 NOTE — Telephone Encounter (Signed)
Scheduled follow-up appointments per 8/9 los. Patient is aware.

## 2022-02-05 NOTE — Telephone Encounter (Signed)
Contacted by Arby Barrette w/[facility]. [phone]  Dental Clearance form dated 11/13/21 (scanned in [place], under [facility]) stated overall dental health did not allow for patient to proceed with treatment with Zometa at that time.   Update per Paige: Patient healed after extraction in June. Cleaning completed.  Still needs more restorative work for noted decay, cracked teeth and crowns. Concern expressed that teeth could break off. No active infection at this time.   Message routed to Dr. Irene Limbo and desk support nurse/pod

## 2022-02-06 ENCOUNTER — Telehealth: Payer: Self-pay | Admitting: *Deleted

## 2022-02-06 NOTE — Telephone Encounter (Signed)
Fax already documented

## 2022-02-11 ENCOUNTER — Encounter: Payer: Self-pay | Admitting: Hematology

## 2022-02-11 NOTE — Progress Notes (Signed)
HEMATOLOGY/ONCOLOGY CLINIC NOTE  DOS  02/04/2022   Patient Care Team: Harlan Stains, MD as PCP - General (Family Medicine) Brunetta Genera, MD as Consulting Physician (Hematology)  CHIEF COMPLAINTS/PURPOSE OF VISIT:  Follow-up for continued evaluation management of multiple myeloma  HISTORY OF PRESENTING ILLNESS: Please see previous note  Tracy Fisher is here for continued evaluation and management of her multiple myeloma on maintenance Revlimid. She notes no acute new symptoms since her last clinic visit. No focal new bone pains. Still has some grade 1 neuropathy which is stable/improved.  Well-controlled with current medications. Labs done today were discussed in detail with the patient. Zometa today was held pending dental clearance.  Oncology History  Smoldering multiple myeloma (SMM)  Multiple myeloma ([place])  08/28/2020 Initial Diagnosis   Multiple myeloma ([place])   09/09/2020 - 10/07/2020 Chemotherapy         01/20/2021 - 07/07/2021 Chemotherapy   Patient is on Treatment Plan : MYELOMA NEWLY DIAGNOSED TRANSPLANT CANDIDATE DaraVRd (Daratumumab IV) q21d x 6 Cycles (Induction/Consolidation)       MEDICAL HISTORY:  Past Medical History:  Diagnosis Date   Bone cancer ([place]) 2018   started chemo pill in  2018   Diabetes mellitus without complication ([place])    Hypertension    Vocal cord cyst     SURGICAL HISTORY: No past surgical history on file.  SOCIAL HISTORY: Social History   Socioeconomic History   Marital status: Divorced    Spouse name: Not on file   Number of children: Not on file   Years of education: Not on file   Highest education level: Not on file  Occupational History   Not on file  Tobacco Use   Smoking status: Never   Smokeless tobacco: Never  Vaping Use   Vaping Use: Never used  Substance and Sexual Activity   Alcohol use: No   Drug use: Never   Sexual activity: Not on file  Other Topics Concern   Not on file   Social History Narrative   Not on file   Social Determinants of Health   Financial Resource Strain: Not on file  Food Insecurity: Not on file  Transportation Needs: Not on file  Physical Activity: Not on file  Stress: Not on file  Social Connections: Not on file  Intimate Partner Violence: Not on file    FAMILY HISTORY: Family History  Problem Relation Age of Onset   Breast cancer Neg Hx     ALLERGIES:  is allergic to bortezomib and sulfa antibiotics.  MEDICATIONS:  Current Outpatient Medications  Medication Sig Dispense Refill   acyclovir (ZOVIRAX) 400 MG tablet Take 1 tablet (400 mg total) by mouth 2 (two) times daily. 60 tablet 3   albuterol (VENTOLIN HFA) 108 (90 Base) MCG/ACT inhaler Inhale 1-2 puffs into the lungs every 6 (six) hours as needed for wheezing or shortness of breath. 8 g 1   amLODipine (NORVASC) 5 MG tablet      amoxicillin-clavulanate (AUGMENTIN) 875-125 MG tablet Take 1 tablet by mouth every 12 (twelve) hours. 14 tablet 0   aspirin EC 81 MG tablet Take 81 mg by mouth daily. Swallow whole.     atorvastatin (LIPITOR) 20 MG tablet      azithromycin (ZITHROMAX) 250 MG tablet 2 tabs (558m) on day 1 and then 1 tab (2548m daily x 4 days 6 each 0   b complex vitamins capsule Take 1 capsule by mouth daily. 30 capsule 11   brimonidine (  ALPHAGAN) 0.2 % ophthalmic solution Place 1 drop into both eyes 2 (two) times daily.   3   Calcium Carbonate-Vitamin D (CALCIUM + D PO) Take 1 tablet by mouth daily.     chlorhexidine (PERIDEX) 0.12 % solution Use as directed 15 mLs in the mouth or throat 2 (two) times daily. 120 mL 0   dorzolamide-timolol (COSOPT) 22.3-6.8 MG/ML ophthalmic solution Place 1 drop into both eyes 2 (two) times daily.   12   DULoxetine (CYMBALTA) 60 MG capsule Take 1 capsule by mouth once daily 30 capsule 0   gabapentin (NEURONTIN) 300 MG capsule TAKE 1 CAPSULE BY MOUTH THREE TIMES DAILY 90 capsule 0   HYDROcodone-acetaminophen (NORCO) 5-325 MG tablet  Take 1 tablet by mouth every 6 (six) hours as needed for moderate pain. 60 tablet 0   lenalidomide (REVLIMID) 10 MG capsule Take 1 capsule (10 mg total) by mouth daily. Take 21 days on, 7 days off, repeat every 28 days. 21 capsule 0   LUMIGAN 0.01 % SOLN INSTILL 1 DROP INTO EACH EYE ONCE DAILY     metoCLOPramide (REGLAN) 10 MG tablet Take 1 tablet (10 mg total) by mouth every 8 (eight) hours as needed for nausea or vomiting. (Patient not taking: No sig reported) 6 tablet 0   mirtazapine (REMERON) 15 MG tablet Take 15 mg by mouth at bedtime.     Multiple Vitamin (MULTIVITAMIN) capsule Take by mouth.     ondansetron (ZOFRAN) 4 MG tablet Take 1 tablet (4 mg total) by mouth every 6 (six) hours. 12 tablet 0   ONETOUCH VERIO test strip USE STRIP TO CHECK GLUCOSE TWICE DAILY     oseltamivir (TAMIFLU) 75 MG capsule Take 1 capsule (75 mg total) by mouth every 12 (twelve) hours. 10 capsule 0   Potassium Chloride ER 20 MEQ TBCR Take 1 tablet by mouth 2 (two) times daily. 60 tablet 0   No current facility-administered medications for this visit.    REVIEW OF SYSTEMS:   10 Point review of Systems was done is negative except as noted above.  PHYSICAL EXAMINATION: ECOG PERFORMANCE STATUS: 1 - Symptomatic but completely ambulatory  Vitals:   02/04/22 0934  BP: (!) 123/94  Pulse: 62  Resp: 18  Temp: (!) 97.5 F (36.4 C)  SpO2: 98%    Filed Weights   02/04/22 0934  Weight: 154 lb 14.4 oz (70.3 kg)  NAD GENERAL:alert, in no acute distress and comfortable SKIN: no acute rashes, no significant lesions EYES: conjunctiva are pink and non-injected, sclera anicteric OROPHARYNX: MMM, no exudates, no oropharyngeal erythema or ulceration NECK: supple, no JVD LYMPH:  no palpable lymphadenopathy in the cervical, axillary or inguinal regions LUNGS: clear to auscultation b/l with normal respiratory effort HEART: regular rate & rhythm ABDOMEN:  normoactive bowel sounds , non tender, not  distended. Extremity: no pedal edema PSYCH: alert & oriented x 3 with fluent speech NEURO: no focal motor/sensory deficits   LABORATORY DATA:      Latest Ref Rng & Units 02/04/2022    9:09 AM 12/05/2021    9:03 AM 10/02/2021    9:07 AM  CBC  WBC 4.0 - 10.5 K/uL 3.5  2.9  4.0   Hemoglobin 12.0 - 15.0 g/dL 12.5  12.3  11.6   Hematocrit 36.0 - 46.0 % 36.0  36.5  35.2   Platelets 150 - 400 K/uL 147  142  192    .    Latest Ref Rng & Units 02/04/2022  9:09 AM 12/05/2021    9:03 AM 10/02/2021    9:07 AM  CMP  Glucose 70 - 99 mg/dL 108  85  95   BUN 8 - 23 mg/dL '12  17  16   ' Creatinine 0.44 - 1.00 mg/dL 0.70  0.72  0.74   Sodium 135 - 145 mmol/L 141  138  142   Potassium 3.5 - 5.1 mmol/L 3.9  3.9  3.9   Chloride 98 - 111 mmol/L 111  109  112   CO2 22 - 32 mmol/L '26  24  25   ' Calcium 8.9 - 10.3 mg/dL 9.1  9.2  9.3   Total Protein 6.5 - 8.1 g/dL 6.0  6.2  6.3   Total Bilirubin 0.3 - 1.2 mg/dL 0.4  0.3  0.4   Alkaline Phos 38 - 126 U/L 77  79  84   AST 15 - 41 U/L '17  18  18   ' ALT 0 - 44 U/L '18  20  18     ' RADIOGRAPHIC STUDIES: I have personally reviewed the radiological images as listed and agreed with the findings in the report. MM 3D SCREEN BREAST BILATERAL  Result Date: 01/19/2022 CLINICAL DATA:  Screening. EXAM: DIGITAL SCREENING BILATERAL MAMMOGRAM WITH TOMOSYNTHESIS AND CAD TECHNIQUE: Bilateral screening digital craniocaudal and mediolateral oblique mammograms were obtained. Bilateral screening digital breast tomosynthesis was performed. The images were evaluated with computer-aided detection. COMPARISON:  Previous exam(s). ACR Breast Density Category b: There are scattered areas of fibroglandular density. FINDINGS: There are no findings suspicious for malignancy. IMPRESSION: No mammographic evidence of malignancy. A result letter of this screening mammogram will be mailed directly to the patient. RECOMMENDATION: Screening mammogram in one year. (Code:SM-B-01Y) BI-RADS CATEGORY  1:  Negative. Electronically Signed   By: Lillia Mountain M.D.   On: 01/19/2022 09:28    ASSESSMENT & PLAN:   76 y.o. female here for follow-up of her multiple myeloma and next treatment   #1 IgG kappa multiple myeloma.   Initially presented as smoldering myeloma and had M spike of 1.9 g/dL which was noted to be IgG kappa on immunofixation electrophoresis. -Bone survey shows no overt evidence of osseous involvement suggestive of multiple myeloma. Beta 2 microglobulin, sedimentation rate an LDH levels are within normal limits. -Bone marrow biopsy shows previously showed 17% monoclonal plasma cells Cytogenetics/MolCy - trisomy 52 (consistent with myeloma)   -Bone study from 09/27/17 showed some osteopenic changes without osteoporosis. Previous bone studies are not yet available  -09/08/2019 FISH Panel revealed "No Mutations Detected". -09/08/2019 Cytogenetics revealed "Normal female karyotype". -09/08/2019 BM Bx Report ([id number])  revealed "BONE MARROW, ASPIRATE, CLOT, CORE: -Hypercellular bone marrow for age with plasma cell neoplasm PERIPHERAL BLOOD: -Macrocytic anemia -Leukopenia." - 20% Plasma Cells  -09/15/2019 PET/CT ([id number]) revealed "1. No findings of active myeloma or other hypermetabolic malignancy. 2.  Aortic Atherosclerosis (ICD10-I70.0). 3. Photopenic hypodense hepatic lesions most compatible with cysts."   A treatment initiation active Multiple myeloma Bone Marrow Biopsy with nearly 60% plasma cells. With M spike of 3.6g/dl Starting to develop mild anemia (does not meet anemia criteria yet) No hypercalcemia, renal insufficiency  PET/CT 07/31/2020 - No abnormal hypermetabolism in the neck, chest, abdomen or pelvis.  -PET CT done 07/18/2021 - No hypermetabolic bone lesions suggestive of multiple myeloma.   #2  Chronic leukopenia with intermittent neutropenia. -B12 and folate levels within normal limits. -Reasonable to take a daily vitamin B complex tablet. -No indication for  G-CSF at this time. -Now also  additionally possibly due to Revlimid.   #3.  Mild anemia and leukopenia likely related to myeloma and Revlimid.  #4 recent influenza A infection in February 2023 -Patient's influenza A URI has resolved.  She was given Augmentin for secondary bacterial infection by her primary care physician.  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 Continue Revlimid 10 mg 3 weeks on 1 week off We will schedule Zometa pending dental clearance might need to be counseled if dental clearance is not obtained.   FOLLOW UP: No zometa today Labs and Zometa in 2 weeks RTC with Dr Irene Limbo with labs and subsequent dose of zometa in 3 months  The total time spent in the appointment was 20 minutes*.  All of the patient's questions were answered with apparent satisfaction. The patient knows to call the clinic with any problems, questions or concerns.   Sullivan Lone MD MS AAHIVMS [facility] Hematology/Oncology Physician [facility]  .*Total Encounter Time as defined by the Centers for Medicare and Medicaid Services includes, in addition to the face-to-face time of a patient visit (documented in the note above) non-face-to-face time: obtaining and reviewing outside history, ordering and reviewing medications, tests or procedures, care coordination (communications with other health care professionals or caregivers) and documentation in the medical record.  [place]

## 2022-02-18 ENCOUNTER — Other Ambulatory Visit: Payer: Self-pay

## 2022-02-18 ENCOUNTER — Inpatient Hospital Stay (HOSPITAL_BASED_OUTPATIENT_CLINIC_OR_DEPARTMENT_OTHER): Payer: Medicare HMO

## 2022-02-18 ENCOUNTER — Inpatient Hospital Stay: Payer: Medicare HMO

## 2022-02-18 VITALS — BP 117/78 | HR 76 | Temp 98.3°F | Resp 20 | Wt 157.4 lb

## 2022-02-18 DIAGNOSIS — Z7982 Long term (current) use of aspirin: Secondary | ICD-10-CM | POA: Diagnosis not present

## 2022-02-18 DIAGNOSIS — D72819 Decreased white blood cell count, unspecified: Secondary | ICD-10-CM | POA: Diagnosis not present

## 2022-02-18 DIAGNOSIS — C9000 Multiple myeloma not having achieved remission: Secondary | ICD-10-CM

## 2022-02-18 DIAGNOSIS — Z79899 Other long term (current) drug therapy: Secondary | ICD-10-CM | POA: Diagnosis not present

## 2022-02-18 DIAGNOSIS — M8589 Other specified disorders of bone density and structure, multiple sites: Secondary | ICD-10-CM

## 2022-02-18 DIAGNOSIS — D472 Monoclonal gammopathy: Secondary | ICD-10-CM

## 2022-02-18 DIAGNOSIS — Z7189 Other specified counseling: Secondary | ICD-10-CM

## 2022-02-18 DIAGNOSIS — Z79624 Long term (current) use of inhibitors of nucleotide synthesis: Secondary | ICD-10-CM | POA: Diagnosis not present

## 2022-02-18 DIAGNOSIS — D649 Anemia, unspecified: Secondary | ICD-10-CM | POA: Diagnosis not present

## 2022-02-18 LAB — CMP (CANCER CENTER ONLY)
ALT: 19 U/L (ref 0–44)
AST: 18 U/L (ref 15–41)
Albumin: 4.2 g/dL (ref 3.5–5.0)
Alkaline Phosphatase: 83 U/L (ref 38–126)
Anion gap: 4 — ABNORMAL LOW (ref 5–15)
BUN: 13 mg/dL (ref 8–23)
CO2: 27 mmol/L (ref 22–32)
Calcium: 9.6 mg/dL (ref 8.9–10.3)
Chloride: 109 mmol/L (ref 98–111)
Creatinine: 0.61 mg/dL (ref 0.44–1.00)
GFR, Estimated: >60 mL/min (ref >60)
Glucose, Bld: 93 mg/dL (ref 70–99)
Potassium: 4.2 mmol/L (ref 3.5–5.1)
Sodium: 140 mmol/L (ref 135–145)
Total Bilirubin: 0.5 mg/dL (ref 0.3–1.2)
Total Protein: 6 g/dL — ABNORMAL LOW (ref 6.5–8.1)

## 2022-02-18 LAB — CBC WITH DIFFERENTIAL/PLATELET
Abs Immature Granulocytes: 0 10*3/uL (ref 0.00–0.07)
Basophils Absolute: 0.1 10*3/uL (ref 0.0–0.1)
Basophils Relative: 2 %
Eosinophils Absolute: 0.2 10*3/uL (ref 0.0–0.5)
Eosinophils Relative: 5 %
HCT: 35.4 % — ABNORMAL LOW (ref 36.0–46.0)
Hemoglobin: 12.2 g/dL (ref 12.0–15.0)
Immature Granulocytes: 0 %
Lymphocytes Relative: 53 %
Lymphs Abs: 1.9 10*3/uL (ref 0.7–4.0)
MCH: 32.7 pg (ref 26.0–34.0)
MCHC: 34.5 g/dL (ref 30.0–36.0)
MCV: 94.9 fL (ref 80.0–100.0)
Monocytes Absolute: 0.5 10*3/uL (ref 0.1–1.0)
Monocytes Relative: 16 %
Neutro Abs: 0.8 10*3/uL — ABNORMAL LOW (ref 1.7–7.7)
Neutrophils Relative %: 24 %
Platelets: 157 10*3/uL (ref 150–400)
RBC: 3.73 MIL/uL — ABNORMAL LOW (ref 3.87–5.11)
RDW: 14.6 % (ref 11.5–15.5)
WBC: 3.5 10*3/uL — ABNORMAL LOW (ref 4.0–10.5)
nRBC: 0 % (ref 0.0–0.2)

## 2022-02-18 MED ORDER — SODIUM CHLORIDE 0.9 % IV SOLN: Freq: Once | INTRAVENOUS | Status: AC

## 2022-02-18 MED ORDER — ZOLEDRONIC ACID 4 MG/100ML IV SOLN
4.0000 mg | Freq: Once | INTRAVENOUS | Status: AC
  Administered 2022-02-18: 4 mg via INTRAVENOUS
  Filled 2022-02-18: qty 100

## 2022-02-18 MED ORDER — ACYCLOVIR 400 MG PO TABS: 400.0000 mg | ORAL_TABLET | Freq: Two times a day (BID) | ORAL | 3 refills | Status: DC

## 2022-02-18 NOTE — Patient Instructions (Signed)

## 2022-02-25 ENCOUNTER — Other Ambulatory Visit: Payer: Self-pay | Admitting: Hematology

## 2022-02-25 DIAGNOSIS — C9000 Multiple myeloma not having achieved remission: Secondary | ICD-10-CM

## 2022-03-03 ENCOUNTER — Encounter: Payer: Self-pay | Admitting: Hematology

## 2022-03-04 ENCOUNTER — Other Ambulatory Visit: Payer: Self-pay

## 2022-03-04 ENCOUNTER — Other Ambulatory Visit: Payer: Self-pay | Admitting: Hematology

## 2022-03-04 DIAGNOSIS — C9000 Multiple myeloma not having achieved remission: Secondary | ICD-10-CM

## 2022-03-04 MED ORDER — LENALIDOMIDE 10 MG PO CAPS: 10.0000 mg | ORAL_CAPSULE | Freq: Every day | ORAL | 0 refills | Status: DC

## 2022-03-07 DIAGNOSIS — R109 Unspecified abdominal pain: Secondary | ICD-10-CM | POA: Diagnosis not present

## 2022-03-31 ENCOUNTER — Other Ambulatory Visit: Payer: Self-pay | Admitting: Hematology

## 2022-03-31 DIAGNOSIS — C9000 Multiple myeloma not having achieved remission: Secondary | ICD-10-CM

## 2022-04-05 ENCOUNTER — Other Ambulatory Visit: Payer: Self-pay | Admitting: Hematology

## 2022-04-05 DIAGNOSIS — C9000 Multiple myeloma not having achieved remission: Secondary | ICD-10-CM

## 2022-04-06 ENCOUNTER — Encounter: Payer: Self-pay | Admitting: Neurology

## 2022-04-06 ENCOUNTER — Encounter: Payer: Self-pay | Admitting: Hematology

## 2022-04-06 ENCOUNTER — Telehealth: Payer: Self-pay | Admitting: Neurology

## 2022-04-06 ENCOUNTER — Ambulatory Visit: Payer: Medicare HMO | Admitting: Neurology

## 2022-04-06 VITALS — BP 134/81 | HR 72 | Ht 64.0 in | Wt 156.8 lb

## 2022-04-06 DIAGNOSIS — M79672 Pain in left foot: Secondary | ICD-10-CM

## 2022-04-06 DIAGNOSIS — M722 Plantar fascial fibromatosis: Secondary | ICD-10-CM | POA: Diagnosis not present

## 2022-04-06 DIAGNOSIS — M79671 Pain in right foot: Secondary | ICD-10-CM | POA: Diagnosis not present

## 2022-04-06 DIAGNOSIS — M79641 Pain in right hand: Secondary | ICD-10-CM

## 2022-04-06 DIAGNOSIS — G5603 Carpal tunnel syndrome, bilateral upper limbs: Secondary | ICD-10-CM

## 2022-04-06 DIAGNOSIS — M79642 Pain in left hand: Secondary | ICD-10-CM | POA: Diagnosis not present

## 2022-04-06 NOTE — Progress Notes (Signed)
[facility]    Provider:  Dr Jaynee Eagles Requesting Provider: Harlan Stains, MD Primary Care Provider:  Harlan Stains, MD  CC:  foot and hand pain  HPI:  Tracy Fisher is a 76 y.o. female here as requested by Harlan Stains, MD for foot and hand pain. She has a PMHx of multiple myeloma, diabetes, HTN. I reviewed Dr. Grier Mitts notes: She is on Cymbalta and gabapentin and hydrocodone which are medications that can be used in polyneuropathy.  I reviewed Dr. Pollie Meyer physical examination which was normal including general skin eyes oropharynx neck lymph lungs heart abdomen extremities psych and neuro.  She has a history of IgG kappa myeloma multiple myeloma, initially presented as smoldering myeloma and had an M spike of 1.9 g/dL which was noted to be IgG kappa on immunofixation electrophoresis per Dr. Irene Limbo, bone survey showed no overt evidence of osseous involvement suggestive of multiple myeloma, bone marrow biopsy showed previously showed 70% monoclonal plasma cells, cytogenetics trisomy 11 consistent with myeloma, bone study from April 2019 showed some osteopenic changes without osteoporosis, PAD in 2021 revealed no findings of active myeloma or other hypermetabolic malignancy, treatment was initiated and PET 2022 again showed no abnormal hypermetabolism in the neck chest abdomen or pelvis.  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  - Symptoms are in her hands: pins and pricks in the fingers, decreased sensation, difficulty opening water. Drops things.   - Both feet and and both hands, mostly in the arch it hurts. More stiffness and not in the toes. And in the heels. Mor painful and stiff and not burning. Also some  hyperpigmentation may have CVI. Needs follow up with podiatry and/or pcp.  Reviewed notes, labs and imaging from outside physicians, which showed: see above  Recent CBC testing shows slightly decreased white blood cells and platelets otherwise normal, CMP showed slightly increased glucose 108 otherwise unremarkable both drawn October 2023.  Review of Systems: Patient complains of symptoms per HPI as well as the following symptoms hand pain. Pertinent negatives and positives per HPI. All others negative.   Social History   Socioeconomic History   Marital status: Divorced    Spouse name: Not on file   Number of children: Not on file   Years of education: Not on file   Highest education level: Not on file  Occupational History   Not on file  Tobacco Use   Smoking status: Never   Smokeless tobacco: Never  Vaping Use   Vaping Use: Never used  Substance and Sexual Activity   Alcohol use: No   Drug use: Never   Sexual activity: Not on file  Other Topics Concern   Not on file  Social History Narrative   Not on file   Social Determinants of Health   Financial Resource Strain: Not on file  Food Insecurity: Not on file  Transportation Needs: Not on file  Physical Activity: Not on file  Stress: Not on file  Social Connections: Not on file  Intimate Partner Violence: Not on file    Family History  Problem Relation Age of Onset   Breast cancer Neg Hx    Neuropathy Neg Hx     Past Medical History:  Diagnosis Date   Bone cancer ([place])  2018   started chemo pill in  2018   Diabetes mellitus without complication ([place])    Hypertension    Vocal cord cyst     Patient Active Problem List   Diagnosis Date Noted   Pancytopenia, acquired ([place]) 02/10/2021   Multiple myeloma ([place]) 08/28/2020   Osteopenia of multiple sites 11/07/2018   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 60 tablet 3   amLODipine (NORVASC) 5 MG tablet      aspirin EC 81 MG tablet Take 81 mg by mouth daily. Swallow whole.     atorvastatin (LIPITOR) 20 MG tablet      b complex vitamins capsule Take 1 capsule by mouth daily. 30 capsule 11   brimonidine (ALPHAGAN) 0.2 % ophthalmic solution Place 1 drop into both eyes 2 (two) times daily.   3   Calcium Carbonate-Vitamin D (CALCIUM + D PO) Take 1 tablet by mouth daily.     dorzolamide-timolol (COSOPT) 22.3-6.8 MG/ML ophthalmic solution Place 1 drop into both eyes 2 (two) times daily.   12   DULoxetine (CYMBALTA) 60 MG capsule Take 1 capsule by mouth once daily 30 capsule 0   gabapentin (NEURONTIN) 300 MG capsule TAKE 1 CAPSULE BY MOUTH THREE TIMES DAILY 90 capsule 0   HYDROcodone-acetaminophen (NORCO) 5-325 MG tablet Take 1 tablet by mouth every 6 (six) hours as needed for moderate pain. 60 tablet 0   lenalidomide (REVLIMID) 10 MG capsule Take 1 capsule (10 mg total) by mouth daily. Take 21 days on, 7 days off, repeat every 28 days. 21 capsule 0   LUMIGAN 0.01 % SOLN INSTILL 1 DROP INTO EACH EYE ONCE DAILY     mirtazapine (REMERON) 15 MG tablet Take 15 mg by mouth at bedtime.     Multiple Vitamin (MULTIVITAMIN) capsule Take by mouth.     ondansetron (ZOFRAN) 4 MG tablet Take 1 tablet (4 mg total) by mouth every 6 (six) hours. 12 tablet 0   ONETOUCH VERIO test strip USE STRIP TO CHECK GLUCOSE TWICE DAILY     potassium chloride SA (KLOR-CON M) 20 MEQ tablet TAKE 1  BY MOUTH TWICE DAILY 60 tablet 0   No current facility-administered medications for this visit.    Allergies as of 04/06/2022 - Review Complete 04/06/2022  Allergen Reaction Noted   Bortezomib Other (See Comments) 10/17/2020   Sulfa antibiotics  Itching 11/20/2013    Vitals: BP 134/81   Pulse 72   Ht _0  (1.626 m)   Wt 156 lb 12.8 oz (71.1 kg)   BMI 26.91 kg/m  Last Weight:  Wt Readings from Last 1 Encounters:  04/06/22 156 lb 12.8 oz (71.1 kg)   Last Height:   Ht Readings from Last 1 Encounters:  04/06/22 _1  (1.626 m)     Physical exam: Exam: Gen: NAD, conversant, well nourised, well groomed                     CV: RRR, no MRG. No Carotid Bruits. No peripheral edema, warm, nontender Eyes: Conjunctivae clear without exudates or hemorrhage  Neuro: Detailed Neurologic Exam  Speech:  Speech is normal; fluent and spontaneous with normal comprehension.  Cognition:    The patient is oriented to person, place, and time;     recent and remote memory intact;     language fluent;     normal attention, concentration,     fund of knowledge Cranial Nerves:    The pupils are equal, round, and reactive to light. The fundi are normal and spontaneous venous pulsations are present. Visual fields are full to finger confrontation. Extraocular movements are intact. Trigeminal sensation is intact and the muscles of mastication are normal. The face is symmetric. The palate elevates in the midline. Hearing intact. Voice is normal. Shoulder shrug is normal. The tongue has normal motion without fasciculations.   Coordination:    Normal   Gait:    Antalgic due to foot pain on walking   Motor Observation:    No asymmetry, no atrophy, and no involuntary movements noted. Tone:    Normal muscle tone.    Posture:    Posture is normal. normal erect    Strength:    Strength is V/V in the upper and lower limbs.      Sensation: intact to LT, pin prick, temp, a few secs vibration     Reflex Exam:  DTR's: trace AJs, 1+ patellars,      Toes:    The toes are downgoing bilaterally.   Clonus:    Clonus is absent.    Assessment/Plan:  76 y.o. female here as requested by Harlan Stains, MD for pain in the hands and  feet  Carpal Tunnel Syndrome: Dr [facility] Feet: Feel like it is orthopaedic not nerev damage, maybe plantar fasciitis(pain in heel and arch of foot) or concomitant chronic venous insufficiency(hyperpigmentation at ankles) or both. She sees an orthopaedist Dr, Dorna Leitz or could see podiatry, recommend either will sent [facility] fasciitis or ortho foot problems per patient request Chronic Venous Insufficiency - see primary care for evaluation  - Symptoms in her hands: pins and pricks in the fingers, decreased sensation, difficulty opening water. Drops things. Numbness/ sensory changes digits 1-4. Need to check for CTS.   - Both feet hurt especially with first steps in the morning or on her feet, intact to light touch, pinprick and can feel temp, trace AJs, hurts mostly in the arch and heels and with steps it hurts. More stiffness and no sensory changes in the toes. And in the heels radiating to the arch. More painful and stiff and not burning. Also some hyperpigmentation may have CVI.  Needs follow up with podiatry and/or pcp, maybe plantar fascciitis or something other structural.   Orders Placed This Encounter  Procedures   Ambulatory referral to Orthopedic Surgery   Ambulatory referral to Hand Surgery   No orders of the defined types were placed in this encounter.   Cc: Harlan Stains, MD,  Harlan Stains, MD  Sarina Ill, MD  [facility] [address] [place], [place] [address]  Phone [phone] Fax [phone]  I spent 60 minutes of face-to-face and non-face-to-face time with patient on the  1. Plantar fasciitis   2. Bilateral carpal tunnel syndrome   3. Pain in both feet   4. Pain in both hands    diagnosis.  This included previsit chart review, lab review, study review, order entry, electronic health record documentation, patient education on the different diagnostic and therapeutic options, counseling and  coordination of care, risks and benefits of management,  compliance, or risk factor reduction

## 2022-04-06 NOTE — Telephone Encounter (Signed)
Referral for hand surgery sent to [facility]. Phone:  [phone], Fax: [phone]

## 2022-04-06 NOTE — Telephone Encounter (Addendum)
Referral for Orthopedic Surgery with Dr. Dorna Leitz sent to [facility]. Phone: [phone], Fax: [phone]

## 2022-04-06 NOTE — Patient Instructions (Addendum)
Carpal Tunnel Syndrome: Dr [facility] Feet: Feel like it is orthopaedic not nerev damage, maybe plantar fasciitis or chronic venous insufficiency or both. She sees an orthopaedist Dr, Dorna Leitz or podiatry Chronic Venous Insufficiency - see primary care for evaluation  Plantar Fasciitis - need ortho or podiatry  Plantar fasciitis is a painful foot condition that affects the heel and arch. It occurs when the band of tissue that connects the toes to the heel bone (plantar fascia) becomes irritated. This can happen as the result of exercising too much or doing other repetitive activities (overuse injury). Plantar fasciitis can cause mild irritation to severe pain that makes it difficult to walk or move. The pain is usually worse in the morning after sleeping, or after sitting or lying down for a period of time. Pain may also be worse after long periods of walking or standing. What are the causes? This condition may be caused by: Standing for long periods of time. Wearing shoes that do not have good arch support. Doing activities that put stress on joints (high-impact activities). This includes ballet and exercise that makes your heart beat faster (aerobic exercise), such as running. Being overweight. An abnormal way of walking (gait). Tight muscles in the back of your lower leg (calf). High arches in your feet or flat feet. Starting a new athletic activity. What are the signs or symptoms? The main symptom of this condition is heel pain. Pain may get worse after the following: Taking the first steps after a time of rest, especially in the morning after awakening, or after you have been sitting or lying down for a while. Long periods of standing still. Pain may decrease after 30-45 minutes of activity, such as gentle walking. How is this diagnosed? This condition may be diagnosed based on your medical history, a physical exam, and your symptoms. Your health care provider will  check for: A tender area on the bottom of your foot. A high arch in your foot or flat feet. Pain when you move your foot. Difficulty moving your foot. You may have imaging tests to confirm the diagnosis, such as: X-rays. Ultrasound. MRI. How is this treated? Treatment for plantar fasciitis depends on how severe your condition is. Treatment may include: Rest, ice, pressure (compression), and raising (elevating) the affected foot. This is called RICE therapy. Your health care provider may recommend RICE therapy along with over-the-counter pain medicines to manage your pain. Exercises to stretch your calves and your plantar fascia. A splint that holds your foot in a stretched, upward position while you sleep (night splint). Physical therapy to relieve symptoms and prevent problems in the future. Injections of steroid medicine (cortisone) to relieve pain and inflammation. Stimulating your plantar fascia with electrical impulses (extracorporeal shock wave therapy). This is usually the last treatment option before surgery. Surgery, if other treatments have not worked after 12 months. Follow these instructions at home: Managing pain, stiffness, and swelling  If directed, put ice on the painful area. To do this: Put ice in a plastic bag, or use a frozen bottle of water. Place a towel between your skin and the bag or bottle. Roll the bottom of your foot over the bag or bottle. Do this for 20 minutes, 2-3 times a day. Wear athletic shoes that have air-sole or gel-sole cushions, or try soft shoe inserts that are designed for plantar fasciitis. Elevate your foot above the level of your heart while you are sitting or lying down. Activity Avoid activities  that cause pain. Ask your health care provider what activities are safe for you. Do physical therapy exercises and stretches as told by your health care provider. Try activities and forms of exercise that are easier on your joints (low impact).  Examples include swimming, water aerobics, and biking. General instructions Take over-the-counter and prescription medicines only as told by your health care provider. Wear a night splint while sleeping, if told by your health care provider. Loosen the splint if your toes tingle, become numb, or turn cold and blue. Maintain a healthy weight, or work with your health care provider to lose weight as needed. Keep all follow-up visits. This is important. Contact a health care provider if you have: Symptoms that do not go away with home treatment. Pain that gets worse. Pain that affects your ability to move or do daily activities. Summary Plantar fasciitis is a painful foot condition that affects the heel. It occurs when the band of tissue that connects the toes to the heel bone (plantar fascia) becomes irritated. Heel pain is the main symptom of this condition. It may get worse after exercising too much or standing still for a long time. Treatment varies, but it usually starts with rest, ice, pressure (compression), and raising (elevating) the affected foot. This is called RICE therapy. Over-the-counter medicines can also be used to manage pain. This information is not intended to replace advice given to you by your health care provider. Make sure you discuss any questions you have with your health care provider. Document Revised: 10/02/2019 Document Reviewed: 10/02/2019 Elsevier Patient Education  2023 [employer]   Carpal Tunnel Syndrome - Dr. Fredna Dow at [place] tunnel syndrome is a condition that causes pain, numbness, and weakness in your hand and fingers. The carpal tunnel is a narrow area located on the palm side of your wrist. Repeated wrist motion or certain diseases may cause swelling within the tunnel. This swelling pinches the main nerve in the wrist. The main nerve in the wrist is called the median nerve. What are the causes? This condition may be caused by: Repeated and  forceful wrist and hand motions. Wrist injuries. Arthritis. A cyst or tumor in the carpal tunnel. Fluid buildup during pregnancy. Use of tools that vibrate. Sometimes the cause of this condition is not known. What increases the risk? The following factors may make you more likely to develop this condition: Having a job that requires you to repeatedly or forcefully move your wrist or hand or requires you to use tools that vibrate. This may include jobs that involve using computers, working on an [employer], or working with [place] such as Pension scheme manager. Being a woman. Having certain conditions, such as: Diabetes. Obesity. An underactive thyroid (hypothyroidism). Kidney failure. Rheumatoid arthritis. What are the signs or symptoms? Symptoms of this condition include: A tingling feeling in your fingers, especially in your thumb, index, and middle fingers. Tingling or numbness in your hand. An aching feeling in your entire arm, especially when your wrist and elbow are bent for a long time. Wrist pain that goes up your arm to your shoulder. Pain that goes down into your palm or fingers. A weak feeling in your hands. You may have trouble grabbing and holding items. Your symptoms may feel worse during the night. How is this diagnosed? This condition is diagnosed with a medical history and physical exam. You may also have tests, including: Electromyogram (EMG). This test measures electrical signals sent by your nerves  into the muscles. Nerve conduction study. This test measures how well electrical signals pass through your nerves. Imaging tests, such as X-rays, ultrasound, and MRI. These tests check for possible causes of your condition. How is this treated? This condition may be treated with: Lifestyle changes. It is important to stop or change the activity that caused your condition. Doing exercise and activities to strengthen and stretch your muscles and tendons (physical  therapy). Making lifestyle changes to help with your condition and learning how to do your daily activities safely (occupational therapy). Medicines for pain and inflammation. This may include medicine that is injected into your wrist. A wrist splint or brace. Surgery. Follow these instructions at home: If you have a splint or brace: Wear the splint or brace as told by your health care provider. Remove it only as told by your health care provider. Loosen the splint or brace if your fingers tingle, become numb, or turn cold and blue. Keep the splint or brace clean. If the splint or brace is not waterproof: Do not let it get wet. Cover it with a watertight covering when you take a bath or shower. Managing pain, stiffness, and swelling If directed, put ice on the painful area. To do this: If you have a removeable splint or brace, remove it as told by your health care provider. Put ice in a plastic bag. Place a towel between your skin and the bag or between the splint or brace and the bag. Leave the ice on for 20 minutes, 2-3 times a day. Do not fall asleep with the cold pack on your skin. Remove the ice if your skin turns bright red. This is very important. If you cannot feel pain, heat, or cold, you have a greater risk of damage to the area. Move your fingers often to reduce stiffness and swelling. General instructions Take over-the-counter and prescription medicines only as told by your health care provider. Rest your wrist and hand from any activity that may be causing your pain. If your condition is work related, talk with your employer about changes that can be made, such as getting a wrist pad to use while typing. Do any exercises as told by your health care provider, physical therapist, or occupational therapist. Keep all follow-up visits. This is important. Contact a health care provider if: You have new symptoms. Your pain is not controlled with medicines. Your symptoms get  worse. Get help right away if: You have severe numbness or tingling in your wrist or hand. Summary Carpal tunnel syndrome is a condition that causes pain, numbness, and weakness in your hand and fingers. It is usually caused by repeated wrist motions. Lifestyle changes and medicines are used to treat carpal tunnel syndrome. Surgery may be recommended. Follow your health care provider's instructions about wearing a splint, resting from activity, keeping follow-up visits, and calling for help. This information is not intended to replace advice given to you by your health care provider. Make sure you discuss any questions you have with your health care provider. Document Revised: 10/26/2019 Document Reviewed: 10/26/2019   Chronic Venous Insufficiency - primary care Chronic venous insufficiency is a condition where the leg veins cannot effectively pump blood from the legs to the heart. This happens when the vein walls are either stretched, weakened, or damaged, or when the valves inside the vein are damaged. With the right treatment, you should be able to continue with an active life. This condition is also called venous stasis. What are the  causes? Common causes of this condition include: High blood pressure inside the veins (venous hypertension). Sitting or standing too long, causing increased blood pressure in the leg veins. A blood clot that blocks blood flow in a vein (deep vein thrombosis, DVT). Inflammation of a vein (phlebitis) that causes a blood clot to form. Tumors in the pelvis that cause blood to back up. What increases the risk? The following factors may make you more likely to develop this condition: Having a family history of this condition. Obesity. Pregnancy. Living without enough regular physical activity or exercise (sedentary lifestyle). Smoking. Having a job that requires long periods of standing or sitting in one place. Being a certain age. Women in their 58s and 82s  and men in their 44s are more likely to develop this condition. What are the signs or symptoms? Symptoms of this condition include: Veins that are enlarged, bulging, or twisted (varicose veins). Skin breakdown or ulcers. Reddened skin or dark discoloration of skin on the leg between the knee and ankle. Brown, smooth, tight, and painful skin just above the ankle, usually on the inside of the leg (lipodermatosclerosis). Swelling of the legs. How is this diagnosed? This condition may be diagnosed based on: Your medical history. A physical exam. Tests, such as: A procedure that creates an image of a blood vessel and nearby organs and provides information about blood flow through the blood vessel (duplex ultrasound). A procedure that tests blood flow (plethysmography). A procedure that looks at the veins using X-ray and dye (venogram). How is this treated? The goals of treatment are to help you return to an active life and to minimize pain or disability. Treatment depends on the severity of your condition, and it may include: Wearing compression stockings. These can help relieve symptoms and help prevent your condition from getting worse. However, they do not cure the condition. Sclerotherapy. This procedure involves an injection of a solution that shrinks damaged veins. Surgery. This may involve: Removing a diseased vein (vein stripping). Cutting off blood flow through the vein (laser ablation surgery). Repairing or reconstructing a valve within the affected vein. Follow these instructions at home:     Wear compression stockings as told by your health care provider. These stockings help to prevent blood clots and reduce swelling in your legs. Take over-the-counter and prescription medicines only as told by your health care provider. Stay active by exercising, walking, or doing different activities. Ask your health care provider what activities are safe for you and how much exercise you  need. Drink enough fluid to keep your urine pale yellow. Do not use any products that contain nicotine or tobacco, such as cigarettes, e-cigarettes, and chewing tobacco. If you need help quitting, ask your health care provider. Keep all follow-up visits as told by your health care provider. This is important. Contact a health care provider if you: Have redness, swelling, or more pain in the affected area. See a red streak or line that goes up or down from the affected area. Have skin breakdown or skin loss in the affected area, even if the breakdown is small. Get an injury in the affected area. Get help right away if: You get an injury and an open wound in the affected area. You have: Severe pain that does not get better with medicine. Sudden numbness or weakness in the foot or ankle below the affected area. Trouble moving your foot or ankle. A fever. Worse or persistent symptoms. Chest pain. Shortness of breath. Summary Chronic venous  insufficiency is a condition where the leg veins cannot effectively pump blood from the legs to the heart. Chronic venous insufficiency occurs when the vein walls become stretched, weakened, or damaged, or when valves within the vein are damaged. Treatment depends on how severe your condition is. It often involves wearing compression stockings and may involve having a procedure. Make sure you stay active by exercising, walking, or doing different activities. Ask your health care provider what activities are safe for you and how much exercise you need. This information is not intended to replace advice given to you by your health care provider. Make sure you discuss any questions you have with your health care provider. Document Revised: 08/27/2020 Document Reviewed: 08/27/2020 Elsevier Patient Education  [facility] Patient Education  [place].

## 2022-04-07 DIAGNOSIS — M722 Plantar fascial fibromatosis: Secondary | ICD-10-CM | POA: Diagnosis not present

## 2022-04-07 DIAGNOSIS — M5451 Vertebrogenic low back pain: Secondary | ICD-10-CM | POA: Diagnosis not present

## 2022-04-10 ENCOUNTER — Other Ambulatory Visit: Payer: Self-pay

## 2022-04-10 DIAGNOSIS — C9000 Multiple myeloma not having achieved remission: Secondary | ICD-10-CM

## 2022-04-10 MED ORDER — LENALIDOMIDE 10 MG PO CAPS: 10.0000 mg | ORAL_CAPSULE | Freq: Every day | ORAL | 0 refills | Status: DC

## 2022-04-27 DIAGNOSIS — M1712 Unilateral primary osteoarthritis, left knee: Secondary | ICD-10-CM | POA: Diagnosis not present

## 2022-04-29 DIAGNOSIS — G5603 Carpal tunnel syndrome, bilateral upper limbs: Secondary | ICD-10-CM | POA: Diagnosis not present

## 2022-04-29 DIAGNOSIS — M79641 Pain in right hand: Secondary | ICD-10-CM | POA: Diagnosis not present

## 2022-04-29 DIAGNOSIS — M79642 Pain in left hand: Secondary | ICD-10-CM | POA: Diagnosis not present

## 2022-04-30 ENCOUNTER — Other Ambulatory Visit: Payer: Self-pay | Admitting: Hematology

## 2022-04-30 DIAGNOSIS — C9000 Multiple myeloma not having achieved remission: Secondary | ICD-10-CM

## 2022-05-05 ENCOUNTER — Other Ambulatory Visit: Payer: Self-pay

## 2022-05-05 DIAGNOSIS — H401123 Primary open-angle glaucoma, left eye, severe stage: Secondary | ICD-10-CM | POA: Diagnosis not present

## 2022-05-05 DIAGNOSIS — C9000 Multiple myeloma not having achieved remission: Secondary | ICD-10-CM

## 2022-05-05 DIAGNOSIS — Z961 Presence of intraocular lens: Secondary | ICD-10-CM | POA: Diagnosis not present

## 2022-05-05 MED ORDER — LENALIDOMIDE 10 MG PO CAPS: 10.0000 mg | ORAL_CAPSULE | Freq: Every day | ORAL | 0 refills | Status: DC

## 2022-05-07 ENCOUNTER — Other Ambulatory Visit: Payer: Self-pay | Admitting: Hematology

## 2022-05-07 DIAGNOSIS — C9000 Multiple myeloma not having achieved remission: Secondary | ICD-10-CM

## 2022-05-08 DIAGNOSIS — G5702 Lesion of sciatic nerve, left lower limb: Secondary | ICD-10-CM | POA: Diagnosis not present

## 2022-05-13 ENCOUNTER — Other Ambulatory Visit: Payer: Self-pay

## 2022-05-13 ENCOUNTER — Inpatient Hospital Stay: Payer: Medicare HMO

## 2022-05-13 ENCOUNTER — Inpatient Hospital Stay: Payer: Medicare HMO | Attending: Hematology | Admitting: Hematology

## 2022-05-13 VITALS — BP 155/80 | HR 74 | Temp 97.8°F | Resp 18 | Ht 64.0 in | Wt 154.9 lb

## 2022-05-13 DIAGNOSIS — Z7189 Other specified counseling: Secondary | ICD-10-CM

## 2022-05-13 DIAGNOSIS — C9000 Multiple myeloma not having achieved remission: Secondary | ICD-10-CM | POA: Insufficient documentation

## 2022-05-13 DIAGNOSIS — C9001 Multiple myeloma in remission: Secondary | ICD-10-CM

## 2022-05-13 DIAGNOSIS — D472 Monoclonal gammopathy: Secondary | ICD-10-CM

## 2022-05-13 DIAGNOSIS — M8589 Other specified disorders of bone density and structure, multiple sites: Secondary | ICD-10-CM

## 2022-05-13 LAB — CMP (CANCER CENTER ONLY)
ALT: 35 U/L (ref 0–44)
AST: 18 U/L (ref 15–41)
Albumin: 4.1 g/dL (ref 3.5–5.0)
Alkaline Phosphatase: 66 U/L (ref 38–126)
Anion gap: 7 (ref 5–15)
BUN: 21 mg/dL (ref 8–23)
CO2: 25 mmol/L (ref 22–32)
Calcium: 9.9 mg/dL (ref 8.9–10.3)
Chloride: 106 mmol/L (ref 98–111)
Creatinine: 0.81 mg/dL (ref 0.44–1.00)
GFR, Estimated: >60 mL/min (ref >60)
Glucose, Bld: 153 mg/dL — ABNORMAL HIGH (ref 70–99)
Potassium: 3.7 mmol/L (ref 3.5–5.1)
Sodium: 138 mmol/L (ref 135–145)
Total Bilirubin: 0.4 mg/dL (ref 0.3–1.2)
Total Protein: 6.9 g/dL (ref 6.5–8.1)

## 2022-05-13 LAB — CBC WITH DIFFERENTIAL/PLATELET
Abs Immature Granulocytes: 0.01 10*3/uL (ref 0.00–0.07)
Basophils Absolute: 0.1 10*3/uL (ref 0.0–0.1)
Basophils Relative: 3 %
Eosinophils Absolute: 0.1 10*3/uL (ref 0.0–0.5)
Eosinophils Relative: 2 %
HCT: 36.2 % (ref 36.0–46.0)
Hemoglobin: 12.6 g/dL (ref 12.0–15.0)
Immature Granulocytes: 0 %
Lymphocytes Relative: 34 %
Lymphs Abs: 1.4 10*3/uL (ref 0.7–4.0)
MCH: 32.1 pg (ref 26.0–34.0)
MCHC: 34.8 g/dL (ref 30.0–36.0)
MCV: 92.1 fL (ref 80.0–100.0)
Monocytes Absolute: 0.5 10*3/uL (ref 0.1–1.0)
Monocytes Relative: 11 %
Neutro Abs: 2 10*3/uL (ref 1.7–7.7)
Neutrophils Relative %: 50 %
Platelets: 229 10*3/uL (ref 150–400)
RBC: 3.93 MIL/uL (ref 3.87–5.11)
RDW: 14.5 % (ref 11.5–15.5)
WBC: 4 10*3/uL (ref 4.0–10.5)
nRBC: 0 % (ref 0.0–0.2)

## 2022-05-13 MED ORDER — SODIUM CHLORIDE 0.9 % IV SOLN: Freq: Once | INTRAVENOUS | Status: AC

## 2022-05-13 MED ORDER — ZOLEDRONIC ACID 4 MG/100ML IV SOLN
4.0000 mg | Freq: Once | INTRAVENOUS | Status: AC
  Administered 2022-05-13: 4 mg via INTRAVENOUS
  Filled 2022-05-13: qty 100

## 2022-05-13 NOTE — Progress Notes (Signed)
HEMATOLOGY/ONCOLOGY CLINIC NOTE  DOS: 05/13/22   Patient Care Team: Harlan Stains, MD as PCP - General (Family Medicine) Brunetta Genera, MD as Consulting Physician (Hematology)  CHIEF COMPLAINTS/PURPOSE OF VISIT:  Follow-up for continued evaluation management of multiple myeloma  HISTORY OF PRESENTING ILLNESS: Please see previous note  Tracy Fisher is here for continued evaluation and management of her multiple myeloma on maintenance Revlimid.  Patient was last seen by me on 02/04/2022 and was doing well without any new medical concerns.   Patient reports she is doing well during today's visit. She notes she went to ED on 05/08/2022 due to leg pain which radiates to her lower back. She notes they started on muscle relaxer, but is not sure the name of the medication. Her leg pain is better with her muscle relaxer during today's visit.   She denies of any toxicity with her Revlimid. She denies of any toxicities with her infusion.   She denies fever, chills, new dental issues, new infections, back pain, abdominal pain, and leg swelling during today's visit.   She complains of nerve problems on her bilateral hands and bilateral legs. She has an upcoming appointment with her Neurologist. She regularly takes Gabapentin. Patient has received her influenza vaccine and COVID-19 Booster, but not RSV vaccine.    Oncology History  Smoldering multiple myeloma (SMM)  Multiple myeloma ([place])  08/28/2020 Initial Diagnosis   Multiple myeloma ([place])   09/09/2020 - 10/07/2020 Chemotherapy         01/20/2021 - 07/07/2021 Chemotherapy   Patient is on Treatment Plan : MYELOMA NEWLY DIAGNOSED TRANSPLANT CANDIDATE DaraVRd (Daratumumab IV) q21d x 6 Cycles (Induction/Consolidation)       MEDICAL HISTORY:  Past Medical History:  Diagnosis Date   Bone cancer ([place]) 2018   started chemo pill in  2018   Diabetes mellitus without complication ([place])    Hypertension    Vocal  cord cyst     SURGICAL HISTORY: Past Surgical History:  Procedure Laterality Date   NO PAST SURGERIES      SOCIAL HISTORY: Social History   Socioeconomic History   Marital status: Divorced    Spouse name: Not on file   Number of children: Not on file   Years of education: Not on file   Highest education level: Not on file  Occupational History   Not on file  Tobacco Use   Smoking status: Never   Smokeless tobacco: Never  Vaping Use   Vaping Use: Never used  Substance and Sexual Activity   Alcohol use: No   Drug use: Never   Sexual activity: Not on file  Other Topics Concern   Not on file  Social History Narrative   Not on file   Social Determinants of Health   Financial Resource Strain: Not on file  Food Insecurity: Not on file  Transportation Needs: Not on file  Physical Activity: Not on file  Stress: Not on file  Social Connections: Not on file  Intimate Partner Violence: Not on file    FAMILY HISTORY: Family History  Problem Relation Age of Onset   Breast cancer Neg Hx    Neuropathy Neg Hx     ALLERGIES:  is allergic to bortezomib and sulfa antibiotics.  MEDICATIONS:  Current Outpatient Medications  Medication Sig Dispense Refill   acyclovir (ZOVIRAX) 400 MG tablet Take 1 tablet (400 mg total) by mouth 2 (two) times daily. 60 tablet 3   amLODipine (NORVASC) 5 MG tablet  aspirin EC 81 MG tablet Take 81 mg by mouth daily. Swallow whole.     atorvastatin (LIPITOR) 20 MG tablet      b complex vitamins capsule Take 1 capsule by mouth daily. 30 capsule 11   brimonidine (ALPHAGAN) 0.2 % ophthalmic solution Place 1 drop into both eyes 2 (two) times daily.   3   Calcium Carbonate-Vitamin D (CALCIUM + D PO) Take 1 tablet by mouth daily.     dorzolamide-timolol (COSOPT) 22.3-6.8 MG/ML ophthalmic solution Place 1 drop into both eyes 2 (two) times daily.   12   DULoxetine (CYMBALTA) 60 MG capsule Take 1 capsule by mouth once daily 30 capsule 0   gabapentin  (NEURONTIN) 300 MG capsule TAKE 1 CAPSULE BY MOUTH THREE TIMES DAILY 90 capsule 0   HYDROcodone-acetaminophen (NORCO) 5-325 MG tablet Take 1 tablet by mouth every 6 (six) hours as needed for moderate pain. 60 tablet 0   lenalidomide (REVLIMID) 10 MG capsule Take 1 capsule (10 mg total) by mouth daily. Take 21 days on, 7 days off, repeat every 28 days. 21 capsule 0   LUMIGAN 0.01 % SOLN INSTILL 1 DROP INTO EACH EYE ONCE DAILY     mirtazapine (REMERON) 15 MG tablet Take 15 mg by mouth at bedtime.     Multiple Vitamin (MULTIVITAMIN) capsule Take by mouth.     ondansetron (ZOFRAN) 4 MG tablet Take 1 tablet (4 mg total) by mouth every 6 (six) hours. 12 tablet 0   ONETOUCH VERIO test strip USE STRIP TO CHECK GLUCOSE TWICE DAILY     potassium chloride SA (KLOR-CON M) 20 MEQ tablet TAKE 1  BY MOUTH TWICE DAILY 60 tablet 0   No current facility-administered medications for this visit.    REVIEW OF SYSTEMS:   10 Point review of Systems was done is negative except as noted above.  PHYSICAL EXAMINATION: ECOG PERFORMANCE STATUS: 1 - Symptomatic but completely ambulatory  Vitals:   05/13/22 0923  BP: (!) 155/80  Pulse: 74  Resp: 18  Temp: 97.8 F (36.6 C)  SpO2: 98%   Filed Weights   05/13/22 0923  Weight: 154 lb 14.4 oz (70.3 kg)   NAD GENERAL:alert, in no acute distress and comfortable SKIN: no acute rashes, no significant lesions EYES: conjunctiva are pink and non-injected, sclera anicteric OROPHARYNX: MMM, no exudates, no oropharyngeal erythema or ulceration NECK: supple, no JVD LYMPH:  no palpable lymphadenopathy in the cervical, axillary or inguinal regions LUNGS: clear to auscultation b/l with normal respiratory effort HEART: regular rate & rhythm ABDOMEN:  normoactive bowel sounds , non tender, not distended. Extremity: no pedal edema PSYCH: alert & oriented x 3 with fluent speech NEURO: no focal motor/sensory deficits   LABORATORY DATA:      Latest Ref Rng & Units  05/13/2022    9:07 AM 02/18/2022    8:55 AM 02/04/2022    9:09 AM  CBC  WBC 4.0 - 10.5 K/uL 4.0  3.5  3.5   Hemoglobin 12.0 - 15.0 g/dL 12.6  12.2  12.5   Hematocrit 36.0 - 46.0 % 36.2  35.4  36.0   Platelets 150 - 400 K/uL 229  157  147    .    Latest Ref Rng & Units 05/13/2022    9:07 AM 02/18/2022    8:55 AM 02/04/2022    9:09 AM  CMP  Glucose 70 - 99 mg/dL 153  93  108   BUN 8 - 23 mg/dL 21  13  12   Creatinine 0.44 - 1.00 mg/dL 0.81  0.61  0.70   Sodium 135 - 145 mmol/L 138  140  141   Potassium 3.5 - 5.1 mmol/L 3.7  4.2  3.9   Chloride 98 - 111 mmol/L 106  109  111   CO2 22 - 32 mmol/L _0 Calcium 8.9 - 10.3 mg/dL 9.9  9.6  9.1   Total Protein 6.5 - 8.1 g/dL 6.9  6.0  6.0   Total Bilirubin 0.3 - 1.2 mg/dL 0.4  0.5  0.4   Alkaline Phos 38 - 126 U/L 66  83  77   AST 15 - 41 U/L _1 ALT 0 - 44 U/L 35  19  18     RADIOGRAPHIC STUDIES: I have personally reviewed the radiological images as listed and agreed with the findings in the report. No results found.  ASSESSMENT & PLAN:   76 y.o. female here for follow-up of her multiple myeloma and next treatment   #1 IgG kappa multiple myeloma.   Initially presented as smoldering myeloma and had M spike of 1.9 g/dL which was noted to be IgG kappa on immunofixation electrophoresis. -Bone survey shows no overt evidence of osseous involvement suggestive of multiple myeloma. Beta 2 microglobulin, sedimentation rate an LDH levels are within normal limits. -Bone marrow biopsy shows previously showed 17% monoclonal plasma cells Cytogenetics/MolCy - trisomy 24 (consistent with myeloma)   -Bone study from 09/27/17 showed some osteopenic changes without osteoporosis. Previous bone studies are not yet available  -09/08/2019 FISH Panel revealed "No Mutations Detected". -09/08/2019 Cytogenetics revealed "Normal female karyotype". -09/08/2019 BM Bx Report ([id number])  revealed "BONE MARROW, ASPIRATE, CLOT, CORE:  -Hypercellular bone marrow for age with plasma cell neoplasm PERIPHERAL BLOOD: -Macrocytic anemia -Leukopenia." - 20% Plasma Cells  -09/15/2019 PET/CT ([id number]) revealed "1. No findings of active myeloma or other hypermetabolic malignancy. 2.  Aortic Atherosclerosis (ICD10-I70.0). 3. Photopenic hypodense hepatic lesions most compatible with cysts."   A treatment initiation active Multiple myeloma Bone Marrow Biopsy with nearly 60% plasma cells. With M spike of 3.6g/dl Starting to develop mild anemia (does not meet anemia criteria yet) No hypercalcemia, renal insufficiency  PET/CT 07/31/2020 - No abnormal hypermetabolism in the neck, chest, abdomen or pelvis.  -PET CT done 07/18/2021 - No hypermetabolic bone lesions suggestive of multiple myeloma.   #2  Chronic leukopenia with intermittent neutropenia. -B12 and folate levels within normal limits. -Reasonable to take a daily vitamin B complex tablet. -No indication for G-CSF at this time. -Now also additionally possibly due to Revlimid.   #3.  Mild anemia and leukopenia likely related to myeloma and Revlimid.  #4 recent influenza A infection in February 2023 -Patient's influenza A URI has resolved.  She was given Augmentin for secondary bacterial infection by her primary care physician.  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 -Continue Revlimid 10 mg 3 weeks on 1 week off.    FOLLOW UP: RTC with Dr Irene Limbo with labs and subsequent dose of zometa in 3  months   The total time spent in the appointment was 20 minutes* .  All of  the patient's questions were answered with apparent satisfaction. The patient knows to call the clinic with any problems, questions or concerns.   Sullivan Lone MD MS AAHIVMS [facility] Hematology/Oncology Physician [facility]  .*Total Encounter Time as defined by the Centers for Medicare and Medicaid Services includes, in addition to the face-to-face time of a patient visit (documented in the note above) non-face-to-face time: obtaining and reviewing outside history, ordering and reviewing medications, tests or procedures, care coordination (communications with other health care professionals or caregivers) and documentation in the medical record.   Zettie Cooley, am acting as a Education administrator for Sullivan Lone, MD.

## 2022-05-13 NOTE — Patient Instructions (Signed)

## 2022-05-14 ENCOUNTER — Telehealth: Payer: Self-pay | Admitting: Hematology

## 2022-05-14 NOTE — Telephone Encounter (Signed)
Per 11/15 los called and spoke to pt about appointments

## 2022-05-18 ENCOUNTER — Other Ambulatory Visit: Payer: Self-pay | Admitting: Hematology

## 2022-05-18 DIAGNOSIS — C9000 Multiple myeloma not having achieved remission: Secondary | ICD-10-CM

## 2022-05-18 LAB — MULTIPLE MYELOMA PANEL, SERUM
Albumin SerPl Elph-Mcnc: 3.5 g/dL (ref 2.9–4.4)
Albumin/Glob SerPl: 1.4 (ref 0.7–1.7)
Alpha 1: 0.2 g/dL (ref 0.0–0.4)
Alpha2 Glob SerPl Elph-Mcnc: 0.9 g/dL (ref 0.4–1.0)
B-Globulin SerPl Elph-Mcnc: 0.7 g/dL (ref 0.7–1.3)
Gamma Glob SerPl Elph-Mcnc: 0.7 g/dL (ref 0.4–1.8)
Globulin, Total: 2.6 g/dL (ref 2.2–3.9)
IgA: 57 mg/dL — ABNORMAL LOW (ref 64–422)
IgG (Immunoglobin G), Serum: 749 mg/dL (ref 586–1602)
IgM (Immunoglobulin M), Srm: 29 mg/dL (ref 26–217)
Total Protein ELP: 6.1 g/dL (ref 6.0–8.5)

## 2022-05-19 ENCOUNTER — Encounter: Payer: Self-pay | Admitting: Hematology

## 2022-05-23 ENCOUNTER — Emergency Department (HOSPITAL_BASED_OUTPATIENT_CLINIC_OR_DEPARTMENT_OTHER)
Admission: EM | Admit: 2022-05-23 | Discharge: 2022-05-23 | Disposition: A | Discharge status: Home / Self Care | Payer: Medicare HMO | Attending: Emergency Medicine | Admitting: Emergency Medicine

## 2022-05-23 ENCOUNTER — Other Ambulatory Visit: Payer: Self-pay

## 2022-05-23 ENCOUNTER — Other Ambulatory Visit (HOSPITAL_BASED_OUTPATIENT_CLINIC_OR_DEPARTMENT_OTHER): Payer: Self-pay

## 2022-05-23 ENCOUNTER — Encounter: Payer: Self-pay | Admitting: Hematology

## 2022-05-23 ENCOUNTER — Emergency Department (HOSPITAL_BASED_OUTPATIENT_CLINIC_OR_DEPARTMENT_OTHER): Payer: Medicare HMO

## 2022-05-23 DIAGNOSIS — Z1152 Encounter for screening for COVID-19: Secondary | ICD-10-CM | POA: Diagnosis not present

## 2022-05-23 DIAGNOSIS — I10 Essential (primary) hypertension: Secondary | ICD-10-CM | POA: Diagnosis not present

## 2022-05-23 DIAGNOSIS — Z79899 Other long term (current) drug therapy: Secondary | ICD-10-CM | POA: Insufficient documentation

## 2022-05-23 DIAGNOSIS — D72819 Decreased white blood cell count, unspecified: Secondary | ICD-10-CM | POA: Insufficient documentation

## 2022-05-23 DIAGNOSIS — J209 Acute bronchitis, unspecified: Secondary | ICD-10-CM | POA: Insufficient documentation

## 2022-05-23 DIAGNOSIS — E119 Type 2 diabetes mellitus without complications: Secondary | ICD-10-CM | POA: Diagnosis not present

## 2022-05-23 DIAGNOSIS — Z7982 Long term (current) use of aspirin: Secondary | ICD-10-CM | POA: Insufficient documentation

## 2022-05-23 DIAGNOSIS — D696 Thrombocytopenia, unspecified: Secondary | ICD-10-CM | POA: Insufficient documentation

## 2022-05-23 DIAGNOSIS — R059 Cough, unspecified: Secondary | ICD-10-CM | POA: Diagnosis not present

## 2022-05-23 LAB — BASIC METABOLIC PANEL
Anion gap: 8 (ref 5–15)
BUN: 14 mg/dL (ref 8–23)
CO2: 24 mmol/L (ref 22–32)
Calcium: 9.1 mg/dL (ref 8.9–10.3)
Chloride: 107 mmol/L (ref 98–111)
Creatinine, Ser: 0.93 mg/dL (ref 0.44–1.00)
GFR, Estimated: >60 mL/min (ref >60)
Glucose, Bld: 118 mg/dL — ABNORMAL HIGH (ref 70–99)
Potassium: 3.9 mmol/L (ref 3.5–5.1)
Sodium: 139 mmol/L (ref 135–145)

## 2022-05-23 LAB — CBC WITH DIFFERENTIAL/PLATELET
Abs Immature Granulocytes: 0.02 10*3/uL (ref 0.00–0.07)
Basophils Absolute: 0 10*3/uL (ref 0.0–0.1)
Basophils Relative: 1 %
Eosinophils Absolute: 0.1 10*3/uL (ref 0.0–0.5)
Eosinophils Relative: 2 %
HCT: 37.2 % (ref 36.0–46.0)
Hemoglobin: 12.3 g/dL (ref 12.0–15.0)
Immature Granulocytes: 1 %
Lymphocytes Relative: 40 %
Lymphs Abs: 1.3 10*3/uL (ref 0.7–4.0)
MCH: 31.3 pg (ref 26.0–34.0)
MCHC: 33.1 g/dL (ref 30.0–36.0)
MCV: 94.7 fL (ref 80.0–100.0)
Monocytes Absolute: 0.5 10*3/uL (ref 0.1–1.0)
Monocytes Relative: 15 %
Neutro Abs: 1.3 10*3/uL — ABNORMAL LOW (ref 1.7–7.7)
Neutrophils Relative %: 41 %
Platelets: 136 10*3/uL — ABNORMAL LOW (ref 150–400)
RBC: 3.93 MIL/uL (ref 3.87–5.11)
RDW: 15.7 % — ABNORMAL HIGH (ref 11.5–15.5)
WBC: 3.1 10*3/uL — ABNORMAL LOW (ref 4.0–10.5)
nRBC: 0 % (ref 0.0–0.2)

## 2022-05-23 LAB — TROPONIN I (HIGH SENSITIVITY)
Troponin I (High Sensitivity): 31 ng/L — ABNORMAL HIGH (ref <18)
Troponin I (High Sensitivity): 28 ng/L — ABNORMAL HIGH (ref <18)

## 2022-05-23 LAB — RESP PANEL BY RT-PCR (FLU A&B, COVID) ARPGX2
Influenza A by PCR: NEGATIVE
Influenza B by PCR: NEGATIVE
SARS Coronavirus 2 by RT PCR: NEGATIVE

## 2022-05-23 LAB — MAGNESIUM: Magnesium: 1.9 mg/dL (ref 1.7–2.4)

## 2022-05-23 MED ORDER — ALBUTEROL SULFATE HFA 108 (90 BASE) MCG/ACT IN AERS
1.0000 | INHALATION_SPRAY | Freq: Four times a day (QID) | RESPIRATORY_TRACT | 0 refills | Status: DC | PRN
  Filled 2022-05-23: qty 18, 25d supply, fill #0

## 2022-05-23 MED ORDER — IPRATROPIUM-ALBUTEROL 0.5-2.5 (3) MG/3ML IN SOLN
3.0000 mL | Freq: Once | RESPIRATORY_TRACT | Status: AC
  Administered 2022-05-23: 3 mL via RESPIRATORY_TRACT
  Filled 2022-05-23: qty 3

## 2022-05-23 MED ORDER — BENZONATATE 100 MG PO CAPS
100.0000 mg | ORAL_CAPSULE | Freq: Three times a day (TID) | ORAL | 0 refills | Status: DC
  Filled 2022-05-23: qty 21, 7d supply, fill #0

## 2022-05-23 MED ORDER — METHYLPREDNISOLONE 4 MG PO TBPK
ORAL_TABLET | ORAL | 0 refills | Status: DC
  Filled 2022-05-23: qty 21, 6d supply, fill #0

## 2022-05-23 NOTE — ED Notes (Signed)
Dc instructions reviewed with patient. Patient voiced understanding. Dc with belongings.  °

## 2022-05-23 NOTE — Discharge Instructions (Addendum)
Your symptoms are likely due to acute bronchitis, which is often caused by a virus. Your cardiac enzymes were mildly elevated but downtrending, low concern for acute heart attack at this time, your basic metabolic panel was reassuring, your magnesium was reassuring, your CBC did not reveal evidence of an elevated white blood cell count, your white blood cell count was decreased which can be seen in the setting of a viral infection, your red blood cell count was normal, your COVID-19 and influenza PCR testing was also normal.  Your chest x-ray did not reveal evidence of a bacterial pneumonia. Recommend continued supportive care, to include antitussives, decongestant, and Tylenol and Motrin for pain control. Your chest XR and labs did not point towards a bacterial infection.

## 2022-05-23 NOTE — ED Provider Notes (Signed)
[facility] EMERGENCY DEPT Provider Note   CSN: [id number] Arrival date & time: 05/23/22  2409     History  Chief Complaint  Patient presents with   Cough   Nasal Congestion    Tracy Fisher is a 76 y.o. female.   Cough    76 year old female with medical history significant for HTN, DM 2, multiple myeloma and remission who presents emerged department with cough and shortness of breath for the past week.  The patient states that she has had a cough productive of sputum for the past 7 days.  She endorses generalized body aches and bilateral chest discomfort along.  She denies any nausea or vomiting.  She been taking a nasal decongestant without relief.  Home Medications Prior to Admission medications   Medication Sig Start Date End Date Taking? Authorizing Provider  acyclovir (ZOVIRAX) 400 MG tablet Take 1 tablet (400 mg total) by mouth 2 (two) times daily. 02/18/22   Brunetta Genera, MD  amLODipine (NORVASC) 5 MG tablet  02/01/15   [provider]  aspirin EC 81 MG tablet Take 81 mg by mouth daily. Swallow whole.    [provider]  atorvastatin (LIPITOR) 20 MG tablet  11/25/16   [provider]  b complex vitamins capsule Take 1 capsule by mouth daily. 10/10/20   Brunetta Genera, MD  brimonidine (ALPHAGAN) 0.2 % ophthalmic solution Place 1 drop into both eyes 2 (two) times daily.  01/30/18   [provider]  Calcium Carbonate-Vitamin D (CALCIUM + D PO) Take 1 tablet by mouth daily.    [provider]  dorzolamide-timolol (COSOPT) 22.3-6.8 MG/ML ophthalmic solution Place 1 drop into both eyes 2 (two) times daily.  11/21/16   [provider]  DULoxetine (CYMBALTA) 60 MG capsule Take 1 capsule by mouth once daily 05/07/22   Brunetta Genera, MD  gabapentin (NEURONTIN) 300 MG capsule TAKE 1 CAPSULE BY MOUTH THREE TIMES DAILY 05/18/22   Brunetta Genera, MD  HYDROcodone-acetaminophen [facility]) 5-325 MG  tablet Take 1 tablet by mouth every 6 (six) hours as needed for moderate pain. 02/03/22   Brunetta Genera, MD  lenalidomide (REVLIMID) 10 MG capsule Take 1 capsule (10 mg total) by mouth daily. Take 21 days on, 7 days off, repeat every 28 days. 05/05/22   Brunetta Genera, MD  LUMIGAN 0.01 % SOLN INSTILL 1 DROP INTO EACH EYE ONCE DAILY 07/17/20   [provider]  mirtazapine (REMERON) 15 MG tablet Take 15 mg by mouth at bedtime.    [provider]  Multiple Vitamin (MULTIVITAMIN) capsule Take by mouth.    [provider]  ondansetron (ZOFRAN) 4 MG tablet Take 1 tablet (4 mg total) by mouth every 6 (six) hours. 08/10/21   Prosperi, Darrick Meigs H, PA-C  ONETOUCH VERIO test strip USE STRIP TO CHECK GLUCOSE TWICE DAILY 11/07/20   [provider]  potassium chloride SA (KLOR-CON M) 20 MEQ tablet TAKE 1  BY MOUTH TWICE DAILY 04/30/22   Brunetta Genera, MD  prochlorperazine (COMPAZINE) 10 MG tablet Take 1 tablet (10 mg total) by mouth every 6 (six) hours as needed (Nausea or vomiting). 08/28/20 01/08/21  Brunetta Genera, MD      Allergies    Bortezomib and Sulfa antibiotics    Review of Systems   Review of Systems  Respiratory:  Positive for cough.     Physical Exam Updated Vital Signs BP 118/75 (BP Location: Right Arm)   Pulse 81  Temp 98.2 F (36.8 C) (Oral)   Resp 17   Ht _0  (1.626 m)   Wt 70.3 kg   SpO2 100%   BMI 26.60 kg/m  Physical Exam  ED Results / Procedures / Treatments   Labs (all labs ordered are listed, but only abnormal results are displayed) Labs Reviewed  RESP PANEL BY RT-PCR (FLU A&B, COVID) [id number]    EKG None  Radiology DG Chest Portable 1 View  Result Date: 05/23/2022 CLINICAL DATA:  Cough and congestion for 1 week. EXAM: PORTABLE CHEST 1 VIEW COMPARISON:  09/02/2021 FINDINGS: Heart size and mediastinal contours are normal. There is no pleural effusion or edema. No airspace opacities identified. The visualized  osseous structures are unremarkable. IMPRESSION: No active disease. Electronically Signed   By: Kerby Moors M.D.   On: 05/23/2022 11:15    Procedures Procedures  {Document cardiac monitor, telemetry assessment procedure when appropriate:1}  Medications Ordered in ED Medications - No data to display  ED Course/ Medical Decision Making/ A&P                           Medical Decision Making Amount and/or Complexity of Data Reviewed Labs: ordered. Radiology: ordered.  Risk Prescription drug management.   ***  {Document critical care time when appropriate:1} {Document review of labs and clinical decision tools ie heart score, Chads2Vasc2 etc:1}  {Document your independent review of radiology images, and any outside records:1} {Document your discussion with family members, caretakers, and with consultants:1} {Document social determinants of health affecting pt's care:1} {Document your decision making why or why not admission, treatments were needed:1} Final Clinical Impression(s) / ED Diagnoses Final diagnoses:  None    Rx / DC Orders ED Discharge Orders     None

## 2022-05-23 NOTE — ED Triage Notes (Signed)
Patient arrives ambulatory to triage with complaints of productive cough with green sputum and body aches x1 week. Rates body pain a 7/10.  Patient has been taken OTC meds with minimal relief.

## 2022-05-29 DIAGNOSIS — J208 Acute bronchitis due to other specified organisms: Secondary | ICD-10-CM | POA: Diagnosis not present

## 2022-06-02 ENCOUNTER — Other Ambulatory Visit: Payer: Self-pay

## 2022-06-02 ENCOUNTER — Other Ambulatory Visit: Payer: Self-pay | Admitting: Hematology

## 2022-06-02 DIAGNOSIS — C9000 Multiple myeloma not having achieved remission: Secondary | ICD-10-CM

## 2022-06-02 MED ORDER — LENALIDOMIDE 10 MG PO CAPS: 10.0000 mg | ORAL_CAPSULE | Freq: Every day | ORAL | 0 refills | Status: DC

## 2022-06-04 ENCOUNTER — Emergency Department (HOSPITAL_BASED_OUTPATIENT_CLINIC_OR_DEPARTMENT_OTHER): Payer: Medicare HMO

## 2022-06-04 ENCOUNTER — Other Ambulatory Visit: Payer: Self-pay

## 2022-06-04 ENCOUNTER — Emergency Department (HOSPITAL_BASED_OUTPATIENT_CLINIC_OR_DEPARTMENT_OTHER)
Admission: EM | Admit: 2022-06-04 | Discharge: 2022-06-04 | Disposition: A | Discharge status: Home / Self Care | Payer: Medicare HMO | Attending: Emergency Medicine | Admitting: Emergency Medicine

## 2022-06-04 ENCOUNTER — Encounter (HOSPITAL_BASED_OUTPATIENT_CLINIC_OR_DEPARTMENT_OTHER): Payer: Self-pay | Admitting: Emergency Medicine

## 2022-06-04 DIAGNOSIS — D72819 Decreased white blood cell count, unspecified: Secondary | ICD-10-CM | POA: Insufficient documentation

## 2022-06-04 DIAGNOSIS — D696 Thrombocytopenia, unspecified: Secondary | ICD-10-CM | POA: Diagnosis not present

## 2022-06-04 DIAGNOSIS — R519 Headache, unspecified: Secondary | ICD-10-CM | POA: Diagnosis not present

## 2022-06-04 DIAGNOSIS — Z8579 Personal history of other malignant neoplasms of lymphoid, hematopoietic and related tissues: Secondary | ICD-10-CM | POA: Diagnosis not present

## 2022-06-04 DIAGNOSIS — E119 Type 2 diabetes mellitus without complications: Secondary | ICD-10-CM | POA: Insufficient documentation

## 2022-06-04 DIAGNOSIS — I10 Essential (primary) hypertension: Secondary | ICD-10-CM | POA: Diagnosis not present

## 2022-06-04 DIAGNOSIS — S7012XA Contusion of left thigh, initial encounter: Secondary | ICD-10-CM | POA: Diagnosis not present

## 2022-06-04 DIAGNOSIS — X58XXXA Exposure to other specified factors, initial encounter: Secondary | ICD-10-CM | POA: Diagnosis not present

## 2022-06-04 DIAGNOSIS — Z7982 Long term (current) use of aspirin: Secondary | ICD-10-CM | POA: Insufficient documentation

## 2022-06-04 DIAGNOSIS — S79922A Unspecified injury of left thigh, initial encounter: Secondary | ICD-10-CM | POA: Diagnosis not present

## 2022-06-04 DIAGNOSIS — Z79899 Other long term (current) drug therapy: Secondary | ICD-10-CM | POA: Insufficient documentation

## 2022-06-04 DIAGNOSIS — D649 Anemia, unspecified: Secondary | ICD-10-CM | POA: Insufficient documentation

## 2022-06-04 DIAGNOSIS — M7989 Other specified soft tissue disorders: Secondary | ICD-10-CM | POA: Diagnosis not present

## 2022-06-04 LAB — CBC WITH DIFFERENTIAL/PLATELET
Abs Immature Granulocytes: 0.01 10*3/uL (ref 0.00–0.07)
Basophils Absolute: 0.1 10*3/uL (ref 0.0–0.1)
Basophils Relative: 1 %
Eosinophils Absolute: 0.1 10*3/uL (ref 0.0–0.5)
Eosinophils Relative: 2 %
HCT: 33.9 % — ABNORMAL LOW (ref 36.0–46.0)
Hemoglobin: 11.2 g/dL — ABNORMAL LOW (ref 12.0–15.0)
Immature Granulocytes: 0 %
Lymphocytes Relative: 34 %
Lymphs Abs: 1.6 10*3/uL (ref 0.7–4.0)
MCH: 32.1 pg (ref 26.0–34.0)
MCHC: 33 g/dL (ref 30.0–36.0)
MCV: 97.1 fL (ref 80.0–100.0)
Monocytes Absolute: 0.6 10*3/uL (ref 0.1–1.0)
Monocytes Relative: 13 %
Neutro Abs: 2.2 10*3/uL (ref 1.7–7.7)
Neutrophils Relative %: 50 %
Platelets: 177 10*3/uL (ref 150–400)
RBC: 3.49 MIL/uL — ABNORMAL LOW (ref 3.87–5.11)
RDW: 15.9 % — ABNORMAL HIGH (ref 11.5–15.5)
WBC: 4.6 10*3/uL (ref 4.0–10.5)
nRBC: 0 % (ref 0.0–0.2)

## 2022-06-04 LAB — BASIC METABOLIC PANEL
Anion gap: 9 (ref 5–15)
BUN: 19 mg/dL (ref 8–23)
CO2: 24 mmol/L (ref 22–32)
Calcium: 9.1 mg/dL (ref 8.9–10.3)
Chloride: 108 mmol/L (ref 98–111)
Creatinine, Ser: 0.69 mg/dL (ref 0.44–1.00)
GFR, Estimated: >60 mL/min (ref >60)
Glucose, Bld: 114 mg/dL — ABNORMAL HIGH (ref 70–99)
Potassium: 4.2 mmol/L (ref 3.5–5.1)
Sodium: 141 mmol/L (ref 135–145)

## 2022-06-04 LAB — CBG MONITORING, ED: Glucose-Capillary: 125 mg/dL — ABNORMAL HIGH (ref 70–99)

## 2022-06-04 NOTE — Discharge Instructions (Signed)
At this time there does not appear to be the presence of an emergent medical condition, however there is always the potential for conditions to change. Please read and follow the below instructions.  Please return to the Emergency Department immediately for any new or worsening symptoms  . Please be sure to follow up with your Primary Care Provider within one week regarding your visit today; please call their office to schedule an appointment even if you are feeling better for a follow-up visit.   Please read the additional information packets attached to your discharge summary.  Go to the nearest Emergency Department immediately if: You have fever or chills Your headache: Gets very bad quickly. Gets worse after a lot of physical activity. You have any of these symptoms: You continue to vomit. A stiff neck. Trouble seeing. Your eye or ear hurts. Trouble speaking. Weak muscles or you lose muscle control. You lose your balance or have trouble walking. You feel like you will pass out (faint) or you pass out. You are mixed up (confused). You have a seizure. You have any new/concerning or worsening symptoms.  Do not take your medicine if  develop an itchy rash, swelling in your mouth or lips, or difficulty breathing; call 911 and seek immediate emergency medical attention if this occurs.  You may review your lab tests and imaging results in their entirety on your MyChart account.  Please discuss all results of fully with your primary care provider and other specialist at your follow-up visit.  Note: Portions of this text may have been transcribed using voice recognition software. Every effort was made to ensure accuracy; however, inadvertent computerized transcription errors may still be present.

## 2022-06-04 NOTE — ED Provider Notes (Signed)
[facility] EMERGENCY DEPT Provider Note   CSN: [id number] Arrival date & time: 06/04/22  1121     History  No chief complaint on file.   Tracy Fisher is a 76 y.o. female history includes diabetes, hypertension, osteopenia, multiple myeloma.  Patient presents to the ER today for 2 separate issues.  Left leg bruising: Patient noticed bruise to her left inner thigh 2 days ago, she does not recall any fall or injury to the area she reports this tender to palpation she describes a mild aching throbbing pain to the area and improves with not touching the area does not radiate she denies similar in the past.  She does report seeing her primary care provider for an ER follow-up last week and she was told that her blood cell count was low at that time.  She denies history of DVT.  She denies low back pain, leg numbness/weakness, incontinence, fever, chills or any additional concerns regards to her leg.  Headache: Patient reports that over the past 2 weeks she has noticed a mild aching headache it is worse in the morning gradually improves with Tylenol and throughout the day, pain does not radiate it is generalized headache.  She denies history of headaches or migraines.  She does report having a viral illness around 2-3 weeks ago and is unsure if it was related.  She denies fever, chills, vision changes, numbness, tingling, weakness, difficulty speaking, dizziness, neck pain or any additional concerns.    HPI     Home Medications Prior to Admission medications   Medication Sig Start Date End Date Taking? Authorizing Provider  acyclovir (ZOVIRAX) 400 MG tablet Take 1 tablet (400 mg total) by mouth 2 (two) times daily. 02/18/22   Brunetta Genera, MD  albuterol (VENTOLIN HFA) 108 (90 Base) MCG/ACT inhaler Inhale 1-2 puffs into the lungs every 6 (six) hours as needed for wheezing or shortness of breath. 05/23/22   Regan Lemming, MD  amLODipine (NORVASC) 5 MG tablet   02/01/15   [provider]  aspirin EC 81 MG tablet Take 81 mg by mouth daily. Swallow whole.    [provider]  atorvastatin (LIPITOR) 20 MG tablet  11/25/16   [provider]  b complex vitamins capsule Take 1 capsule by mouth daily. 10/10/20   Brunetta Genera, MD  benzonatate (TESSALON) 100 MG capsule Take 1 capsule (100 mg total) by mouth every 8 (eight) hours. 05/23/22   Regan Lemming, MD  brimonidine (ALPHAGAN) 0.2 % ophthalmic solution Place 1 drop into both eyes 2 (two) times daily.  01/30/18   [provider]  Calcium Carbonate-Vitamin D (CALCIUM + D PO) Take 1 tablet by mouth daily.    [provider]  dorzolamide-timolol (COSOPT) 22.3-6.8 MG/ML ophthalmic solution Place 1 drop into both eyes 2 (two) times daily.  11/21/16   [provider]  DULoxetine (CYMBALTA) 60 MG capsule Take 1 capsule by mouth once daily 05/07/22   Brunetta Genera, MD  gabapentin (NEURONTIN) 300 MG capsule TAKE 1 CAPSULE BY MOUTH THREE TIMES DAILY 05/18/22   Brunetta Genera, MD  HYDROcodone-acetaminophen [facility]) 5-325 MG tablet Take 1 tablet by mouth every 6 (six) hours as needed for moderate pain. 02/03/22   Brunetta Genera, MD  lenalidomide (REVLIMID) 10 MG capsule Take 1 capsule (10 mg total) by mouth daily. Take 21 days on, 7 days off, repeat every 28 days. 06/02/22   Brunetta Genera, MD  LUMIGAN 0.01 % SOLN INSTILL  1 DROP INTO EACH EYE ONCE DAILY 07/17/20   [provider]  methylPREDNISolone (MEDROL DOSEPAK) 4 MG TBPK tablet Take as directed on the box 05/23/22   Regan Lemming, MD  mirtazapine (REMERON) 15 MG tablet Take 15 mg by mouth at bedtime.    [provider]  Multiple Vitamin (MULTIVITAMIN) capsule Take by mouth.    [provider]  ondansetron (ZOFRAN) 4 MG tablet Take 1 tablet (4 mg total) by mouth every 6 (six) hours. 08/10/21   Prosperi, Darrick Meigs H, PA-C  ONETOUCH VERIO test strip USE STRIP TO CHECK  GLUCOSE TWICE DAILY 11/07/20   [provider]  potassium chloride SA (KLOR-CON M) 20 MEQ tablet Take 1 tablet by mouth twice daily 06/03/22   Brunetta Genera, MD  prochlorperazine (COMPAZINE) 10 MG tablet Take 1 tablet (10 mg total) by mouth every 6 (six) hours as needed (Nausea or vomiting). 08/28/20 01/08/21  Brunetta Genera, MD      Allergies    Bortezomib and Sulfa antibiotics    Review of Systems   Review of Systems Ten systems are reviewed and are negative for acute change except as noted in the HPI  Physical Exam Updated Vital Signs BP 136/83   Pulse 85   Temp 98.1 F (36.7 C) (Oral)   Resp 16   SpO2 100%  Physical Exam Constitutional:      General: She is not in acute distress.    Appearance: Normal appearance. She is well-developed. She is not ill-appearing or diaphoretic.  HENT:     Head: Normocephalic and atraumatic.  Eyes:     General: Vision grossly intact. Gaze aligned appropriately.     Pupils: Pupils are equal, round, and reactive to light.  Neck:     Trachea: Trachea and phonation normal.  Pulmonary:     Effort: Pulmonary effort is normal. No respiratory distress.  Abdominal:     General: There is no distension.     Palpations: Abdomen is soft.     Tenderness: There is no abdominal tenderness. There is no guarding or rebound.  Musculoskeletal:        General: Normal range of motion.     Cervical back: Normal range of motion.  Skin:    General: Skin is warm and dry.          Comments: 12 x 6 cm of ecchymosis to the left inner thigh.  No erythema or skin break.  No induration or fluctuance.  Area is mildly tender to touch.  She has full range of motion of the hip and knee without pain.  Pedal pulses intact and equal.  Sensation intact and equal distally.  Compartments soft.  Neurological:     Mental Status: She is alert.     GCS: GCS eye subscore is 4. GCS verbal subscore is 5. GCS motor subscore is 6.     Comments: Speech is clear and goal  oriented, follows commands Major Cranial nerves without deficit, no facial droop Normal strength in upper and lower extremities bilaterally including dorsiflexion and plantar flexion, strong and equal grip strength Sensation normal to light and sharp touch Moves extremities without ataxia, coordination intact Normal finger to nose and rapid alternating movements Neg romberg, no pronator drift Normal gait  Psychiatric:        Behavior: Behavior normal.     ED Results / Procedures / Treatments   Labs (all labs ordered are listed, but only abnormal results are displayed) Labs Reviewed  CBC  WITH DIFFERENTIAL/PLATELET - Abnormal; Notable for the following components:      Result Value   RBC 3.49 (*)    Hemoglobin 11.2 (*)    HCT 33.9 (*)    RDW 15.9 (*)    All other components within normal limits  BASIC METABOLIC PANEL - Abnormal; Notable for the following components:   Glucose, Bld 114 (*)    All other components within normal limits  CBG MONITORING, ED - Abnormal; Notable for the following components:   Glucose-Capillary 125 (*)    All other components within normal limits    EKG None  Radiology CT Head Wo Contrast  Result Date: 06/04/2022 CLINICAL DATA:  Headaches x2 weeks EXAM: CT HEAD WITHOUT CONTRAST TECHNIQUE: Contiguous axial images were obtained from the base of the skull through the vertex without intravenous contrast. RADIATION DOSE REDUCTION: This exam was performed according to the departmental dose-optimization program which includes automated exposure control, adjustment of the mA and/or kV according to patient size and/or use of iterative reconstruction technique. COMPARISON:  None Available. FINDINGS: Brain: No acute intracranial findings are seen in noncontrast CT brain. There are no signs of bleeding within the cranium. Ventricles are not dilated. Cortical sulci are prominent. Vascular: Unremarkable. Skull: Unremarkable. Sinuses/Orbits: There is mucosal thickening  in ethmoid sinuses. Other: None. IMPRESSION: No acute intracranial findings are seen in noncontrast CT brain. Atrophy. Chronic ethmoid sinusitis. Electronically Signed   By: Elmer Picker M.D.   On: 06/04/2022 14:06    Procedures Procedures    Medications Ordered in ED Medications - No data to display  ED Course/ Medical Decision Making/ A&P Clinical Course as of 06/04/22 1750  Thu Jun 04, 2022  1409 CT Head Wo Contrast Personal reviewed and interpreted patient CT head.  On my interpretation I do not appreciate any obvious hemorrhage or mass effect.  Please refer to radiologist interpretation. [BM]  0488 Basic metabolic panel(!) BMP shows no emergent left-right derangement, AKI or gap. [BM]  1745 CBC with Differential(!) CBC shows mild anemia of 11.2.  No leukocytosis to suggest infectious process.  No thrombocytopenia. [BM]  8916 POC CBG, ED(!) CBG slightly elevated at 125. [BM]  9450 US Venous Img Lower Unilateral Left Radiology report negative DVT study left leg. [BM]  3888 Patient reassessed she is sitting comfortably bed no acute distress.  Vital signs stable on room air.  She reports she is feeling well at this time and no complaints. [BM]    Clinical Course User Index [BM] Gari Crown                           Medical Decision Making 76 year old female presented for 2 separate issues today.  Bruising of the left inner thigh: No history of trauma or injury to the area.  She is neurovascularly intact to the leg.  Compartments are soft.  No overlying skin changes to suggest infection.  No evidence for cellulitis or abscess.  Patient is concerned because some of her blood work was abnormal by PCP visit last week she reports her counts were low.  This is an area of ecchymosis due to injury she may not quite remember however will obtain Doppler to rule out DVT and repeat her basic labs.  Headache: Reassuring neuro examination today, no infectious symptoms.  No  evidence for meningitis.  No sudden onset to suggest subarachnoid hemorrhage.  Given 2 weeks of new headache and advanced age I did discuss  a CT head with the patient for further evaluation.  Risk versus benefits were discussed with patient patient wished to proceed.  Amount and/or Complexity of Data Reviewed External Data Reviewed: notes.    Details: Reviewed patient's ER visit on May 23, 2022 she was diagnosed with acute bronchitis and treated with albuterol, benzonatate and methylprednisolone.  At that time her COVID flu test were negative.  Labs were overall reassuring she did have slight thrombocytopenia at 136 and mild leukopenia of 3.1.  Her troponin levels were slightly elevated at 31 and 28. Labs: ordered. Radiology: ordered. Decision-making details documented in ED Course.  Risk Risk Details: Patient workup today was overall reassuring.  No clear etiology of her intermittent headache however no red flags to suggest subarachnoid hemorrhage, meningitis, dissection, glaucoma, encephalitis, arteritis or other emergent pathologies.  She has no headache at this time.  I encouraged her to follow-up with her PCP regarding her headache.  ER precautions discussed and patient states understanding.  Asked to patient's bruise to her left upper leg no clear etiology however there is not appear to be any evidence for DVT, abscess, cellulitis or other emergent pathologies.  She has good mobility of the joints above and below without pain.  Suspect patient may have injured the area and not recall.  I encouraged her to follow-up with her primary care provider for recheck of this.  RICE therapy and warm compresses discussed.  Strict ER precautions given.       At this time there does not appear to be any evidence of an acute emergency medical condition and the patient appears stable for discharge with appropriate outpatient follow up. Diagnosis was discussed with patient who verbalizes understanding of  care plan and is agreeable to discharge. I have discussed return precautions with patient who verbalizes understanding. Patient encouraged to follow-up with their PCP. All questions answered.  Patient's case discussed with Dr. Tomi Bamberger who agrees with plan to discharge with follow-up.   Note: Portions of this report may have been transcribed using voice recognition software. Every effort was made to ensure accuracy; however, inadvertent computerized transcription errors may still be present.         Final Clinical Impression(s) / ED Diagnoses Final diagnoses:  Nonintractable headache, unspecified chronicity pattern, unspecified headache type  Contusion of left thigh, initial encounter    Rx / DC Orders ED Discharge Orders     None         Gari Crown 06/04/22 1750    Dorie Rank, MD 06/05/22 1455

## 2022-06-04 NOTE — ED Triage Notes (Signed)
Pt with 2 weeks for headache.left thigh bruise noticed on Tuesday,causing her to limp.

## 2022-06-10 ENCOUNTER — Other Ambulatory Visit: Payer: Self-pay | Admitting: Hematology

## 2022-06-10 DIAGNOSIS — C9000 Multiple myeloma not having achieved remission: Secondary | ICD-10-CM

## 2022-06-12 DIAGNOSIS — T148XXA Other injury of unspecified body region, initial encounter: Secondary | ICD-10-CM | POA: Diagnosis not present

## 2022-06-12 DIAGNOSIS — J988 Other specified respiratory disorders: Secondary | ICD-10-CM | POA: Diagnosis not present

## 2022-06-12 DIAGNOSIS — B9689 Other specified bacterial agents as the cause of diseases classified elsewhere: Secondary | ICD-10-CM | POA: Diagnosis not present

## 2022-06-26 ENCOUNTER — Other Ambulatory Visit: Payer: Self-pay

## 2022-06-26 DIAGNOSIS — C9000 Multiple myeloma not having achieved remission: Secondary | ICD-10-CM

## 2022-06-26 MED ORDER — LENALIDOMIDE 10 MG PO CAPS: 10.0000 mg | ORAL_CAPSULE | Freq: Every day | ORAL | 0 refills | Status: DC

## 2022-07-03 ENCOUNTER — Other Ambulatory Visit: Payer: Self-pay | Admitting: Hematology

## 2022-07-03 DIAGNOSIS — C9000 Multiple myeloma not having achieved remission: Secondary | ICD-10-CM

## 2022-07-09 ENCOUNTER — Other Ambulatory Visit: Payer: Self-pay | Admitting: Hematology

## 2022-07-09 DIAGNOSIS — I10 Essential (primary) hypertension: Secondary | ICD-10-CM | POA: Diagnosis not present

## 2022-07-09 DIAGNOSIS — R69 Illness, unspecified: Secondary | ICD-10-CM | POA: Diagnosis not present

## 2022-07-09 DIAGNOSIS — C9001 Multiple myeloma in remission: Secondary | ICD-10-CM | POA: Diagnosis not present

## 2022-07-09 DIAGNOSIS — Z0000 Encounter for general adult medical examination without abnormal findings: Secondary | ICD-10-CM | POA: Diagnosis not present

## 2022-07-09 DIAGNOSIS — E785 Hyperlipidemia, unspecified: Secondary | ICD-10-CM | POA: Diagnosis not present

## 2022-07-09 DIAGNOSIS — D509 Iron deficiency anemia, unspecified: Secondary | ICD-10-CM | POA: Diagnosis not present

## 2022-07-09 DIAGNOSIS — D709 Neutropenia, unspecified: Secondary | ICD-10-CM | POA: Diagnosis not present

## 2022-07-09 DIAGNOSIS — G620 Drug-induced polyneuropathy: Secondary | ICD-10-CM | POA: Diagnosis not present

## 2022-07-09 DIAGNOSIS — E1169 Type 2 diabetes mellitus with other specified complication: Secondary | ICD-10-CM | POA: Diagnosis not present

## 2022-07-09 DIAGNOSIS — M8588 Other specified disorders of bone density and structure, other site: Secondary | ICD-10-CM | POA: Diagnosis not present

## 2022-07-09 DIAGNOSIS — E559 Vitamin D deficiency, unspecified: Secondary | ICD-10-CM | POA: Diagnosis not present

## 2022-07-09 DIAGNOSIS — C9000 Multiple myeloma not having achieved remission: Secondary | ICD-10-CM

## 2022-07-21 DIAGNOSIS — G629 Polyneuropathy, unspecified: Secondary | ICD-10-CM | POA: Diagnosis not present

## 2022-07-23 ENCOUNTER — Other Ambulatory Visit: Payer: Self-pay

## 2022-07-23 DIAGNOSIS — C9000 Multiple myeloma not having achieved remission: Secondary | ICD-10-CM

## 2022-07-23 MED ORDER — LENALIDOMIDE 10 MG PO CAPS: 10.0000 mg | ORAL_CAPSULE | Freq: Every day | ORAL | 0 refills | Status: DC

## 2022-07-28 DIAGNOSIS — G603 Idiopathic progressive neuropathy: Secondary | ICD-10-CM | POA: Diagnosis not present

## 2022-07-28 DIAGNOSIS — Z8249 Family history of ischemic heart disease and other diseases of the circulatory system: Secondary | ICD-10-CM | POA: Diagnosis not present

## 2022-07-28 DIAGNOSIS — R69 Illness, unspecified: Secondary | ICD-10-CM | POA: Diagnosis not present

## 2022-07-28 DIAGNOSIS — Z7961 Long term (current) use of immunomodulator: Secondary | ICD-10-CM | POA: Diagnosis not present

## 2022-07-28 DIAGNOSIS — Z833 Family history of diabetes mellitus: Secondary | ICD-10-CM | POA: Diagnosis not present

## 2022-07-28 DIAGNOSIS — E785 Hyperlipidemia, unspecified: Secondary | ICD-10-CM | POA: Diagnosis not present

## 2022-07-28 DIAGNOSIS — Z008 Encounter for other general examination: Secondary | ICD-10-CM | POA: Diagnosis not present

## 2022-07-28 DIAGNOSIS — I10 Essential (primary) hypertension: Secondary | ICD-10-CM | POA: Diagnosis not present

## 2022-07-28 DIAGNOSIS — C9000 Multiple myeloma not having achieved remission: Secondary | ICD-10-CM | POA: Diagnosis not present

## 2022-07-28 DIAGNOSIS — Z882 Allergy status to sulfonamides status: Secondary | ICD-10-CM | POA: Diagnosis not present

## 2022-07-28 DIAGNOSIS — H409 Unspecified glaucoma: Secondary | ICD-10-CM | POA: Diagnosis not present

## 2022-07-30 DIAGNOSIS — M25531 Pain in right wrist: Secondary | ICD-10-CM | POA: Diagnosis not present

## 2022-07-30 DIAGNOSIS — M25552 Pain in left hip: Secondary | ICD-10-CM | POA: Diagnosis not present

## 2022-07-30 DIAGNOSIS — M5442 Lumbago with sciatica, left side: Secondary | ICD-10-CM | POA: Diagnosis not present

## 2022-07-30 DIAGNOSIS — M654 Radial styloid tenosynovitis [de Quervain]: Secondary | ICD-10-CM | POA: Diagnosis not present

## 2022-08-05 ENCOUNTER — Other Ambulatory Visit: Payer: Self-pay | Admitting: Hematology

## 2022-08-05 DIAGNOSIS — C9000 Multiple myeloma not having achieved remission: Secondary | ICD-10-CM

## 2022-08-13 ENCOUNTER — Other Ambulatory Visit: Payer: Self-pay | Admitting: Hematology

## 2022-08-13 DIAGNOSIS — C9000 Multiple myeloma not having achieved remission: Secondary | ICD-10-CM

## 2022-08-14 ENCOUNTER — Inpatient Hospital Stay: Payer: Medicare HMO | Admitting: Hematology

## 2022-08-14 ENCOUNTER — Inpatient Hospital Stay: Payer: Medicare HMO

## 2022-08-14 ENCOUNTER — Other Ambulatory Visit: Payer: Self-pay

## 2022-08-14 ENCOUNTER — Inpatient Hospital Stay: Payer: Medicare HMO | Attending: Hematology

## 2022-08-14 VITALS — BP 155/78 | HR 57 | Temp 97.5°F | Resp 18 | Wt 162.6 lb

## 2022-08-14 DIAGNOSIS — C9000 Multiple myeloma not having achieved remission: Secondary | ICD-10-CM

## 2022-08-14 DIAGNOSIS — D472 Monoclonal gammopathy: Secondary | ICD-10-CM

## 2022-08-14 DIAGNOSIS — Z7189 Other specified counseling: Secondary | ICD-10-CM

## 2022-08-14 DIAGNOSIS — C9001 Multiple myeloma in remission: Secondary | ICD-10-CM | POA: Diagnosis not present

## 2022-08-14 DIAGNOSIS — M8589 Other specified disorders of bone density and structure, multiple sites: Secondary | ICD-10-CM

## 2022-08-14 LAB — CBC WITH DIFFERENTIAL (CANCER CENTER ONLY)
Abs Immature Granulocytes: 0.01 10*3/uL (ref 0.00–0.07)
Basophils Absolute: 0.1 10*3/uL (ref 0.0–0.1)
Basophils Relative: 2 %
Eosinophils Absolute: 0.2 10*3/uL (ref 0.0–0.5)
Eosinophils Relative: 5 %
HCT: 35.2 % — ABNORMAL LOW (ref 36.0–46.0)
Hemoglobin: 11.7 g/dL — ABNORMAL LOW (ref 12.0–15.0)
Immature Granulocytes: 0 %
Lymphocytes Relative: 34 %
Lymphs Abs: 1.5 10*3/uL (ref 0.7–4.0)
MCH: 32.4 pg (ref 26.0–34.0)
MCHC: 33.2 g/dL (ref 30.0–36.0)
MCV: 97.5 fL (ref 80.0–100.0)
Monocytes Absolute: 0.6 10*3/uL (ref 0.1–1.0)
Monocytes Relative: 13 %
Neutro Abs: 2 10*3/uL (ref 1.7–7.7)
Neutrophils Relative %: 46 %
Platelet Count: 143 10*3/uL — ABNORMAL LOW (ref 150–400)
RBC: 3.61 MIL/uL — ABNORMAL LOW (ref 3.87–5.11)
RDW: 15.3 % (ref 11.5–15.5)
WBC Count: 4.4 10*3/uL (ref 4.0–10.5)
nRBC: 0 % (ref 0.0–0.2)

## 2022-08-14 LAB — CMP (CANCER CENTER ONLY)
ALT: 22 U/L (ref 0–44)
AST: 17 U/L (ref 15–41)
Albumin: 4 g/dL (ref 3.5–5.0)
Alkaline Phosphatase: 51 U/L (ref 38–126)
Anion gap: 5 (ref 5–15)
BUN: 12 mg/dL (ref 8–23)
CO2: 25 mmol/L (ref 22–32)
Calcium: 9.2 mg/dL (ref 8.9–10.3)
Chloride: 110 mmol/L (ref 98–111)
Creatinine: 0.7 mg/dL (ref 0.44–1.00)
GFR, Estimated: >60 mL/min (ref >60)
Glucose, Bld: 91 mg/dL (ref 70–99)
Potassium: 3.9 mmol/L (ref 3.5–5.1)
Sodium: 140 mmol/L (ref 135–145)
Total Bilirubin: 0.3 mg/dL (ref 0.3–1.2)
Total Protein: 6.4 g/dL — ABNORMAL LOW (ref 6.5–8.1)

## 2022-08-14 MED ORDER — SODIUM CHLORIDE 0.9 % IV SOLN: Freq: Once | INTRAVENOUS | Status: AC

## 2022-08-14 MED ORDER — ZOLEDRONIC ACID 4 MG/100ML IV SOLN
4.0000 mg | Freq: Once | INTRAVENOUS | Status: AC
  Administered 2022-08-14: 4 mg via INTRAVENOUS
  Filled 2022-08-14: qty 100

## 2022-08-14 NOTE — Patient Instructions (Signed)

## 2022-08-14 NOTE — Progress Notes (Signed)
HEMATOLOGY/ONCOLOGY CLINIC NOTE  DOS: 08/14/22   Patient Care Team: Harlan Stains, MD as PCP - General (Family Medicine) Brunetta Genera, MD as Consulting Physician (Hematology)  CHIEF COMPLAINTS/PURPOSE OF VISIT:  Follow-up for continued evaluation management of multiple myeloma  HISTORY OF PRESENTING ILLNESS: Please see previous note  Tracy Fisher is here for continued evaluation and management of her multiple myeloma on maintenance Revlimid.  Patient was last seen by me on 02/04/2022 and reported improved leg pain. However, she complained of nerve problems on her bilateral hands and bilateral legs.   Today, she reports that her neuropathy is okay. She is tolerating Revlimid and denies any stomach issues, abdominal pain, or any new bone pains.  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 She reports that steroid injection has improved her symptoms.  She complains of lower back pain which prevents her from being able to bend over. She reports that she sometimes struggles to stand after sitting down.  She has had some slight diarrhea which she attributes to her other medication. She does not drink sodas and regularly drinks water. She is planning on using an exercise machine regularly in her home. She will see a dentist next week for a cleaning. She reports that she will schedule her mammogram and colonoscopy next week.   Oncology History  Smoldering multiple myeloma (SMM)  Multiple myeloma ([place])  08/28/2020 Initial Diagnosis   Multiple myeloma ([place])   09/09/2020 - 10/07/2020 Chemotherapy         01/20/2021 - 07/07/2021 Chemotherapy   Patient is on Treatment Plan : MYELOMA NEWLY DIAGNOSED TRANSPLANT CANDIDATE DaraVRd (Daratumumab IV) q21d x 6 Cycles (Induction/Consolidation)       MEDICAL HISTORY:  Past Medical History:  Diagnosis Date   Bone cancer ([place]) 2018   started chemo pill in   2018   Diabetes mellitus without complication ([place])    Hypertension    Vocal cord cyst     SURGICAL HISTORY: Past Surgical History:  Procedure Laterality Date   NO PAST SURGERIES      SOCIAL HISTORY: Social History   Socioeconomic History   Marital status: Divorced    Spouse name: Not on file   Number of children: Not on file   Years of education: Not on file   Highest education level: Not on file  Occupational History   Not on file  Tobacco Use   Smoking status: Never   Smokeless tobacco: Never  Vaping Use   Vaping Use: Never used  Substance and Sexual Activity   Alcohol use: No   Drug use: Never   Sexual activity: Not on file  Other Topics Concern   Not on file  Social History Narrative   Not on file   Social Determinants of Health   Financial Resource Strain: Not on file  Food Insecurity: Not on file  Transportation Needs: Not on file  Physical Activity: Not on file  Stress: Not on file  Social Connections: Not on file  Intimate Partner Violence: Not on file    FAMILY HISTORY: Family History  Problem Relation Age of Onset   Breast cancer Neg Hx    Neuropathy Neg Hx     ALLERGIES:  is allergic to bortezomib and sulfa antibiotics.  MEDICATIONS:  Current Outpatient Medications  Medication Sig Dispense Refill   acyclovir (ZOVIRAX) 400 MG tablet Take 1 tablet (400 mg total) by mouth 2 (two) times  daily. 60 tablet 3   albuterol (VENTOLIN HFA) 108 (90 Base) MCG/ACT inhaler Inhale 1-2 puffs into the lungs every 6 (six) hours as needed for wheezing or shortness of breath. 18 g 0   amLODipine (NORVASC) 5 MG tablet      aspirin EC 81 MG tablet Take 81 mg by mouth daily. Swallow whole.     atorvastatin (LIPITOR) 20 MG tablet      b complex vitamins capsule Take 1 capsule by mouth daily. 30 capsule 11   benzonatate (TESSALON) 100 MG capsule Take 1 capsule (100 mg total) by mouth every 8 (eight) hours. 21 capsule 0   brimonidine (ALPHAGAN) 0.2 % ophthalmic  solution Place 1 drop into both eyes 2 (two) times daily.   3   Calcium Carbonate-Vitamin D (CALCIUM + D PO) Take 1 tablet by mouth daily.     dorzolamide-timolol (COSOPT) 22.3-6.8 MG/ML ophthalmic solution Place 1 drop into both eyes 2 (two) times daily.   12   DULoxetine (CYMBALTA) 60 MG capsule Take 1 capsule by mouth once daily 30 capsule 0   gabapentin (NEURONTIN) 300 MG capsule TAKE 1 CAPSULE BY MOUTH THREE TIMES DAILY 90 capsule 0   HYDROcodone-acetaminophen (NORCO) 5-325 MG tablet Take 1 tablet by mouth every 6 (six) hours as needed for moderate pain. 60 tablet 0   lenalidomide (REVLIMID) 10 MG capsule Take 1 capsule (10 mg total) by mouth daily. Take 21 days on, 7 days off, repeat every 28 days. 21 capsule 0   LUMIGAN 0.01 % SOLN INSTILL 1 DROP INTO EACH EYE ONCE DAILY     methylPREDNISolone (MEDROL DOSEPAK) 4 MG TBPK tablet Take as directed on the box 21 tablet 0   mirtazapine (REMERON) 15 MG tablet Take 15 mg by mouth at bedtime.     Multiple Vitamin (MULTIVITAMIN) capsule Take by mouth.     ondansetron (ZOFRAN) 4 MG tablet Take 1 tablet (4 mg total) by mouth every 6 (six) hours. 12 tablet 0   ONETOUCH VERIO test strip USE STRIP TO CHECK GLUCOSE TWICE DAILY     potassium chloride SA (KLOR-CON M) 20 MEQ tablet TAKE 1  BY MOUTH TWICE DAILY 60 tablet 0   No current facility-administered medications for this visit.    REVIEW OF SYSTEMS:    10 Point review of Systems was done is negative except as noted above.  PHYSICAL EXAMINATION: ECOG PERFORMANCE STATUS: 1 - Symptomatic but completely ambulatory  Vitals:   08/14/22 1150  BP: (!) 155/78  Pulse: (!) 57  Resp: 18  Temp: (!) 97.5 F (36.4 C)  SpO2: 97%    Filed Weights   08/14/22 1150  Weight: 162 lb 9.6 oz (73.8 kg)     GENERAL:alert, in no acute distress and comfortable SKIN: no acute rashes, no significant lesions EYES: conjunctiva are pink and non-injected, sclera anicteric OROPHARYNX: MMM, no exudates, no  oropharyngeal erythema or ulceration NECK: supple, no JVD LYMPH:  no palpable lymphadenopathy in the cervical, axillary or inguinal regions LUNGS: clear to auscultation b/l with normal respiratory effort HEART: regular rate & rhythm ABDOMEN:  normoactive bowel sounds , non tender, not distended. Extremity: no pedal edema PSYCH: alert & oriented x 3 with fluent speech NEURO: no focal motor/sensory deficits    LABORATORY DATA:      Latest Ref Rng & Units 08/14/2022   11:24 AM 06/04/2022    2:10 PM 05/23/2022   12:25 PM  CBC  WBC 4.0 - 10.5 K/uL 4.4  4.6  3.1   Hemoglobin 12.0 - 15.0 g/dL 11.7  11.2  12.3   Hematocrit 36.0 - 46.0 % 35.2  33.9  37.2   Platelets 150 - 400 K/uL 143  177  136    .    Latest Ref Rng & Units 08/14/2022   11:24 AM 06/04/2022    2:10 PM 05/23/2022   12:25 PM  CMP  Glucose 70 - 99 mg/dL 91  114  118   BUN 8 - 23 mg/dL 12  19  14   $ Creatinine 0.44 - 1.00 mg/dL 0.70  0.69  0.93   Sodium 135 - 145 mmol/L 140  141  139   Potassium 3.5 - 5.1 mmol/L 3.9  4.2  3.9   Chloride 98 - 111 mmol/L 110  108  107   CO2 22 - 32 mmol/L 25  24  24   $ Calcium 8.9 - 10.3 mg/dL 9.2  9.1  9.1   Total Protein 6.5 - 8.1 g/dL 6.4     Total Bilirubin 0.3 - 1.2 mg/dL 0.3     Alkaline Phos 38 - 126 U/L 51     AST 15 - 41 U/L 17     ALT 0 - 44 U/L 22       RADIOGRAPHIC STUDIES: I have personally reviewed the radiological images as listed and agreed with the findings in the report. No results found.  ASSESSMENT & PLAN:   78 y.o. female here for follow-up of her multiple myeloma and next treatment   #1 IgG kappa multiple myeloma.   Initially presented as smoldering myeloma and had M spike of 1.9 g/dL which was noted to be IgG kappa on immunofixation electrophoresis. -Bone survey shows no overt evidence of osseous involvement suggestive of multiple myeloma. Beta 2 microglobulin, sedimentation rate an LDH levels are within normal limits. -Bone marrow biopsy shows previously  showed 17% monoclonal plasma cells Cytogenetics/MolCy - trisomy 37 (consistent with myeloma)   -Bone study from 09/27/17 showed some osteopenic changes without osteoporosis. Previous bone studies are not yet available  -09/08/2019 FISH Panel revealed "No Mutations Detected". -09/08/2019 Cytogenetics revealed "Normal female karyotype". -09/08/2019 BM Bx Report [id number])  revealed "BONE MARROW, ASPIRATE, CLOT, CORE: -Hypercellular bone marrow for age with plasma cell neoplasm PERIPHERAL BLOOD: -Macrocytic anemia -Leukopenia." - 20% Plasma Cells  -09/15/2019 PET/CT ([id number]) revealed "1. No findings of active myeloma or other hypermetabolic malignancy. 2.  Aortic Atherosclerosis (ICD10-I70.0). 3. Photopenic hypodense hepatic lesions most compatible with cysts."   A treatment initiation active Multiple myeloma Bone Marrow Biopsy with nearly 60% plasma cells. With M spike of 3.6g/dl Starting to develop mild anemia (does not meet anemia criteria yet) No hypercalcemia, renal insufficiency  PET/CT 07/31/2020 - No abnormal hypermetabolism in the neck, chest, abdomen or pelvis.  -PET CT done 07/18/2021 - No hypermetabolic bone lesions suggestive of multiple myeloma.   #2  Chronic leukopenia with intermittent neutropenia. -B12 and folate levels within normal limits. -Reasonable to take a daily vitamin B complex tablet. -No indication for G-CSF at this time. -Now also additionally possibly due to Revlimid.   #3.  Mild anemia and leukopenia likely related to myeloma and Revlimid.  #4 rh/o influenza A infection in February 2023 -Patient's influenza A URI has resolved.  She was given Augmentin for secondary bacterial infection by her primary care physician.  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 CBC stable, showed WBC of 4.4 K, hemoglobin of 11.7, and platelets of  143 K.  -CMP stable -last myeloma lab from a few months ago revealed that patient continues to be in remission  -no evidence of active myeloma on last blood tests, will follow with remaining labs -No notable toxicities from patient's maintenance Revlimid at this time and she will continue the same. -Patient has no signs or symptoms suggestive of multiple myeloma progression at this time   FOLLOW UP:  RTC with Dr Irene Limbo with labs and subsequent dose of zometa in 3 months   The total time spent in the appointment was 20 minutes* .  All of the patient's questions were answered with apparent satisfaction. The patient knows to call the clinic with any problems, questions or concerns.   Sullivan Lone MD MS AAHIVMS [facility] Hematology/Oncology Physician [facility]  .*Total Encounter Time as defined by the Centers for Medicare and Medicaid Services includes, in addition to the face-to-face time of a patient visit (documented in the note above) non-face-to-face time: obtaining and reviewing outside history, ordering and reviewing medications, tests or procedures, care coordination (communications with other health care professionals or caregivers) and documentation in the medical record.   I,Mitra Faeizi,acting as a Education administrator for Sullivan Lone, MD.,have documented all relevant documentation on the behalf of Sullivan Lone, MD,as directed by  Sullivan Lone, MD while in the presence of Sullivan Lone, MD.  .I have reviewed the above documentation for accuracy and completeness, and I agree with the above. Brunetta Genera MD

## 2022-08-14 NOTE — Progress Notes (Signed)
Patient seen by MD today  Vitals are within treatment parameters.  Labs reviewed: and are within treatment parameters. Dr. Irene Limbo aware plts 143  Per physician team, patient is ready for treatment and there are NO modifications to the treatment plan.

## 2022-08-17 ENCOUNTER — Telehealth: Payer: Self-pay | Admitting: Hematology

## 2022-08-17 NOTE — Telephone Encounter (Signed)
Called patient per 2/16 los notes to schedule f/u. Patient scheduled and notified.

## 2022-08-18 ENCOUNTER — Other Ambulatory Visit: Payer: Self-pay | Admitting: Hematology

## 2022-08-18 DIAGNOSIS — C9000 Multiple myeloma not having achieved remission: Secondary | ICD-10-CM

## 2022-08-19 ENCOUNTER — Other Ambulatory Visit: Payer: Self-pay

## 2022-08-19 DIAGNOSIS — C9000 Multiple myeloma not having achieved remission: Secondary | ICD-10-CM

## 2022-08-19 MED ORDER — LENALIDOMIDE 10 MG PO CAPS: 10.0000 mg | ORAL_CAPSULE | Freq: Every day | ORAL | 0 refills | Status: DC

## 2022-08-20 ENCOUNTER — Encounter: Payer: Self-pay | Admitting: Hematology

## 2022-08-20 LAB — MULTIPLE MYELOMA PANEL, SERUM
Albumin SerPl Elph-Mcnc: 3.9 g/dL (ref 2.9–4.4)
Albumin/Glob SerPl: 1.7 (ref 0.7–1.7)
Alpha 1: 0.2 g/dL (ref 0.0–0.4)
Alpha2 Glob SerPl Elph-Mcnc: 0.6 g/dL (ref 0.4–1.0)
B-Globulin SerPl Elph-Mcnc: 0.7 g/dL (ref 0.7–1.3)
Gamma Glob SerPl Elph-Mcnc: 0.8 g/dL (ref 0.4–1.8)
Globulin, Total: 2.4 g/dL (ref 2.2–3.9)
IgA: 59 mg/dL — ABNORMAL LOW (ref 64–422)
IgG (Immunoglobin G), Serum: 986 mg/dL (ref 586–1602)
IgM (Immunoglobulin M), Srm: 38 mg/dL (ref 26–217)
Total Protein ELP: 6.3 g/dL (ref 6.0–8.5)

## 2022-08-24 DIAGNOSIS — M25531 Pain in right wrist: Secondary | ICD-10-CM | POA: Diagnosis not present

## 2022-08-24 DIAGNOSIS — M545 Low back pain, unspecified: Secondary | ICD-10-CM | POA: Diagnosis not present

## 2022-09-01 ENCOUNTER — Other Ambulatory Visit (HOSPITAL_COMMUNITY): Payer: Self-pay

## 2022-09-03 ENCOUNTER — Other Ambulatory Visit: Payer: Self-pay | Admitting: Hematology

## 2022-09-03 DIAGNOSIS — C9000 Multiple myeloma not having achieved remission: Secondary | ICD-10-CM

## 2022-09-03 DIAGNOSIS — H401123 Primary open-angle glaucoma, left eye, severe stage: Secondary | ICD-10-CM | POA: Diagnosis not present

## 2022-09-12 DIAGNOSIS — R197 Diarrhea, unspecified: Secondary | ICD-10-CM | POA: Diagnosis not present

## 2022-09-12 DIAGNOSIS — T451X5A Adverse effect of antineoplastic and immunosuppressive drugs, initial encounter: Secondary | ICD-10-CM | POA: Diagnosis not present

## 2022-09-12 DIAGNOSIS — K521 Toxic gastroenteritis and colitis: Secondary | ICD-10-CM | POA: Diagnosis not present

## 2022-09-15 ENCOUNTER — Other Ambulatory Visit: Payer: Self-pay | Admitting: Hematology

## 2022-09-15 DIAGNOSIS — C9000 Multiple myeloma not having achieved remission: Secondary | ICD-10-CM

## 2022-09-24 DIAGNOSIS — M25531 Pain in right wrist: Secondary | ICD-10-CM | POA: Diagnosis not present

## 2022-09-25 ENCOUNTER — Other Ambulatory Visit: Payer: Self-pay

## 2022-09-25 DIAGNOSIS — C9000 Multiple myeloma not having achieved remission: Secondary | ICD-10-CM

## 2022-09-25 MED ORDER — LENALIDOMIDE 10 MG PO CAPS: 10.0000 mg | ORAL_CAPSULE | Freq: Every day | ORAL | 0 refills | Status: DC

## 2022-09-29 ENCOUNTER — Other Ambulatory Visit: Payer: Self-pay

## 2022-09-29 DIAGNOSIS — C9000 Multiple myeloma not having achieved remission: Secondary | ICD-10-CM

## 2022-09-29 MED ORDER — POTASSIUM CHLORIDE CRYS ER 20 MEQ PO TBCR: 20.0000 meq | EXTENDED_RELEASE_TABLET | Freq: Two times a day (BID) | ORAL | 0 refills | Status: DC

## 2022-09-29 MED ORDER — ACYCLOVIR 400 MG PO TABS: 400.0000 mg | ORAL_TABLET | Freq: Two times a day (BID) | ORAL | 3 refills | Status: DC

## 2022-09-29 MED ORDER — GABAPENTIN 300 MG PO CAPS: 300.0000 mg | ORAL_CAPSULE | Freq: Three times a day (TID) | ORAL | 0 refills | Status: DC

## 2022-10-01 ENCOUNTER — Other Ambulatory Visit: Payer: Self-pay | Admitting: Hematology

## 2022-10-01 DIAGNOSIS — C9000 Multiple myeloma not having achieved remission: Secondary | ICD-10-CM

## 2022-10-02 ENCOUNTER — Encounter: Payer: Self-pay | Admitting: Hematology

## 2022-10-19 ENCOUNTER — Other Ambulatory Visit: Payer: Self-pay

## 2022-10-19 ENCOUNTER — Other Ambulatory Visit: Payer: Self-pay | Admitting: Hematology

## 2022-10-19 DIAGNOSIS — C9000 Multiple myeloma not having achieved remission: Secondary | ICD-10-CM

## 2022-10-19 MED ORDER — LENALIDOMIDE 10 MG PO CAPS
10.0000 mg | ORAL_CAPSULE | Freq: Every day | ORAL | 0 refills | Status: DC
Start: 2022-10-19 — End: 2022-11-02

## 2022-10-26 DIAGNOSIS — S76312A Strain of muscle, fascia and tendon of the posterior muscle group at thigh level, left thigh, initial encounter: Secondary | ICD-10-CM | POA: Diagnosis not present

## 2022-10-29 ENCOUNTER — Other Ambulatory Visit: Payer: Self-pay

## 2022-10-29 DIAGNOSIS — C9000 Multiple myeloma not having achieved remission: Secondary | ICD-10-CM

## 2022-10-29 MED ORDER — ACYCLOVIR 400 MG PO TABS
400.0000 mg | ORAL_TABLET | Freq: Two times a day (BID) | ORAL | 3 refills | Status: DC
Start: 2022-10-29 — End: 2022-12-30

## 2022-11-02 ENCOUNTER — Other Ambulatory Visit: Payer: Self-pay

## 2022-11-02 DIAGNOSIS — C9000 Multiple myeloma not having achieved remission: Secondary | ICD-10-CM

## 2022-11-02 MED ORDER — LENALIDOMIDE 10 MG PO CAPS
10.0000 mg | ORAL_CAPSULE | Freq: Every day | ORAL | 0 refills | Status: DC
Start: 2022-11-02 — End: 2022-11-24

## 2022-11-05 ENCOUNTER — Other Ambulatory Visit: Payer: Self-pay | Admitting: Hematology

## 2022-11-05 ENCOUNTER — Other Ambulatory Visit: Payer: Self-pay

## 2022-11-05 DIAGNOSIS — C9000 Multiple myeloma not having achieved remission: Secondary | ICD-10-CM

## 2022-11-13 ENCOUNTER — Inpatient Hospital Stay: Payer: Medicare HMO | Admitting: Hematology

## 2022-11-13 ENCOUNTER — Inpatient Hospital Stay: Payer: Medicare HMO | Attending: Hematology

## 2022-11-13 ENCOUNTER — Inpatient Hospital Stay: Payer: Medicare HMO

## 2022-11-13 VITALS — BP 139/85 | HR 77 | Temp 97.9°F | Resp 16 | Wt 159.5 lb

## 2022-11-13 DIAGNOSIS — Z79899 Other long term (current) drug therapy: Secondary | ICD-10-CM | POA: Diagnosis not present

## 2022-11-13 DIAGNOSIS — Z7982 Long term (current) use of aspirin: Secondary | ICD-10-CM | POA: Diagnosis not present

## 2022-11-13 DIAGNOSIS — R252 Cramp and spasm: Secondary | ICD-10-CM | POA: Insufficient documentation

## 2022-11-13 DIAGNOSIS — Z79624 Long term (current) use of inhibitors of nucleotide synthesis: Secondary | ICD-10-CM | POA: Insufficient documentation

## 2022-11-13 DIAGNOSIS — Z7961 Long term (current) use of immunomodulator: Secondary | ICD-10-CM | POA: Diagnosis not present

## 2022-11-13 DIAGNOSIS — M8589 Other specified disorders of bone density and structure, multiple sites: Secondary | ICD-10-CM

## 2022-11-13 DIAGNOSIS — C9000 Multiple myeloma not having achieved remission: Secondary | ICD-10-CM | POA: Diagnosis not present

## 2022-11-13 DIAGNOSIS — D472 Monoclonal gammopathy: Secondary | ICD-10-CM

## 2022-11-13 DIAGNOSIS — Z7189 Other specified counseling: Secondary | ICD-10-CM

## 2022-11-13 DIAGNOSIS — I10 Essential (primary) hypertension: Secondary | ICD-10-CM | POA: Insufficient documentation

## 2022-11-13 LAB — CBC WITH DIFFERENTIAL (CANCER CENTER ONLY)
Abs Immature Granulocytes: 0 10*3/uL (ref 0.00–0.07)
Basophils Absolute: 0 10*3/uL (ref 0.0–0.1)
Basophils Relative: 1 %
Eosinophils Absolute: 0.2 10*3/uL (ref 0.0–0.5)
Eosinophils Relative: 6 %
HCT: 36.1 % (ref 36.0–46.0)
Hemoglobin: 12.3 g/dL (ref 12.0–15.0)
Immature Granulocytes: 0 %
Lymphocytes Relative: 50 %
Lymphs Abs: 1.3 10*3/uL (ref 0.7–4.0)
MCH: 32.6 pg (ref 26.0–34.0)
MCHC: 34.1 g/dL (ref 30.0–36.0)
MCV: 95.8 fL (ref 80.0–100.0)
Monocytes Absolute: 0.3 10*3/uL (ref 0.1–1.0)
Monocytes Relative: 11 %
Neutro Abs: 0.8 10*3/uL — ABNORMAL LOW (ref 1.7–7.7)
Neutrophils Relative %: 32 %
Platelet Count: 159 10*3/uL (ref 150–400)
RBC: 3.77 MIL/uL — ABNORMAL LOW (ref 3.87–5.11)
RDW: 15.2 % (ref 11.5–15.5)
WBC Count: 2.5 10*3/uL — ABNORMAL LOW (ref 4.0–10.5)
nRBC: 0 % (ref 0.0–0.2)

## 2022-11-13 LAB — CMP (CANCER CENTER ONLY)
ALT: 26 U/L (ref 0–44)
AST: 20 U/L (ref 15–41)
Albumin: 4.2 g/dL (ref 3.5–5.0)
Alkaline Phosphatase: 52 U/L (ref 38–126)
Anion gap: 4 — ABNORMAL LOW (ref 5–15)
BUN: 14 mg/dL (ref 8–23)
CO2: 25 mmol/L (ref 22–32)
Calcium: 9.2 mg/dL (ref 8.9–10.3)
Chloride: 113 mmol/L — ABNORMAL HIGH (ref 98–111)
Creatinine: 0.81 mg/dL (ref 0.44–1.00)
GFR, Estimated: >60 mL/min (ref >60)
Glucose, Bld: 86 mg/dL (ref 70–99)
Potassium: 4.2 mmol/L (ref 3.5–5.1)
Sodium: 142 mmol/L (ref 135–145)
Total Bilirubin: 0.4 mg/dL (ref 0.3–1.2)
Total Protein: 6.6 g/dL (ref 6.5–8.1)

## 2022-11-13 MED ORDER — ZOLEDRONIC ACID 4 MG/100ML IV SOLN
4.0000 mg | Freq: Once | INTRAVENOUS | Status: AC
  Administered 2022-11-13: 4 mg via INTRAVENOUS
  Filled 2022-11-13: qty 100

## 2022-11-13 MED ORDER — SODIUM CHLORIDE 0.9 % IV SOLN: Freq: Once | INTRAVENOUS | Status: AC

## 2022-11-13 NOTE — Progress Notes (Signed)
HEMATOLOGY/ONCOLOGY CLINIC NOTE  DOS: 11/13/22   Patient Care Team: [name], MD as PCP - General (Family Medicine) [name], MD as Consulting Physician (Hematology)  CHIEF COMPLAINTS/PURPOSE OF VISIT:  Follow-up for continued evaluation management of multiple myeloma  HISTORY OF PRESENTING ILLNESS: Please see previous note  INTERVAL HISTORY  Tracy Fisher is a 77 y.o. female here for continued evaluation and management of her multiple myeloma on maintenance Revlimid.  Patient was last seen by me on 08/14/2022 and reported recent diagnosis of carpal tunnel in her wrist, leg pain, lower back pain, and mild diarrhea.  Today, she reports that she previously endorsed cramping in her lower extremities and was given medication which she is unsure of the name of. She notes that the medication did resolve symptoms.  She reports that she is tolerating Revlimid well with no major toxicities. She denies any diarrhea or new dental pain. She denies any recent infection issues or medications changes. Patient endorses normal p.o. intake. She has been taking Vitamin D supplements  and drinking water regularly.   She reports that she did recently receive crowns on her teeth but denies any recent dental extractions.  She does complain of back pain after bending over and sometimes takes Tylenol to manage symptoms.  Oncology History  Smoldering multiple myeloma (SMM)  Multiple myeloma (HCC)  08/28/2020 Initial Diagnosis   Multiple myeloma (HCC)   09/09/2020 - 10/07/2020 Chemotherapy         01/20/2021 - 07/07/2021 Chemotherapy   Patient is on Treatment Plan : MYELOMA NEWLY DIAGNOSED TRANSPLANT CANDIDATE DaraVRd (Daratumumab IV) q21d x 6 Cycles (Induction/Consolidation)       MEDICAL HISTORY:  Past Medical History:  Diagnosis Date   Bone cancer (HCC) 2018   started chemo pill in  2018   Diabetes mellitus without complication (HCC)    Hypertension    Vocal cord cyst      SURGICAL HISTORY: Past Surgical History:  Procedure Laterality Date   NO PAST SURGERIES      SOCIAL HISTORY: Social History   Socioeconomic History   Marital status: Divorced    Spouse name: Not on file   Number of children: Not on file   Years of education: Not on file   Highest education level: Not on file  Occupational History   Not on file  Tobacco Use   Smoking status: Never   Smokeless tobacco: Never  Vaping Use   Vaping Use: Never used  Substance and Sexual Activity   Alcohol use: No   Drug use: Never   Sexual activity: Not on file  Other Topics Concern   Not on file  Social History Narrative   Not on file   Social Determinants of Health   Financial Resource Strain: Not on file  Food Insecurity: Not on file  Transportation Needs: Not on file  Physical Activity: Not on file  Stress: Not on file  Social Connections: Not on file  Intimate Partner Violence: Not on file    FAMILY HISTORY: Family History  Problem Relation Age of Onset   Breast cancer Neg Hx    Neuropathy Neg Hx     ALLERGIES:  is allergic to bortezomib and sulfa antibiotics.  MEDICATIONS:  Current Outpatient Medications  Medication Sig Dispense Refill   acyclovir (ZOVIRAX) 400 MG tablet Take 1 tablet (400 mg total) by mouth 2 (two) times daily. 60 tablet 3   albuterol (VENTOLIN HFA) 108 (90 Base) MCG/ACT inhaler Inhale 1-2 puffs into the  lungs every 6 (six) hours as needed for wheezing or shortness of breath. 18 g 0   amLODipine (NORVASC) 5 MG tablet      aspirin EC 81 MG tablet Take 81 mg by mouth daily. Swallow whole.     atorvastatin (LIPITOR) 20 MG tablet      b complex vitamins capsule Take 1 capsule by mouth daily. 30 capsule 11   benzonatate (TESSALON) 100 MG capsule Take 1 capsule (100 mg total) by mouth every 8 (eight) hours. 21 capsule 0   brimonidine (ALPHAGAN) 0.2 % ophthalmic solution Place 1 drop into both eyes 2 (two) times daily.   3   Calcium Carbonate-Vitamin D  (CALCIUM + D PO) Take 1 tablet by mouth daily.     dorzolamide-timolol (COSOPT) 22.3-6.8 MG/ML ophthalmic solution Place 1 drop into both eyes 2 (two) times daily.   12   DULoxetine (CYMBALTA) 60 MG capsule Take 1 capsule by mouth once daily 30 capsule 0   gabapentin (NEURONTIN) 300 MG capsule TAKE 1 CAPSULE BY MOUTH THREE TIMES DAILY 90 capsule 0   HYDROcodone-acetaminophen (NORCO) 5-325 MG tablet Take 1 tablet by mouth every 6 (six) hours as needed for moderate pain. 60 tablet 0   lenalidomide (REVLIMID) 10 MG capsule Take 1 capsule (10 mg total) by mouth daily. Take 21 days on, 7 days off, repeat every 28 days. 21 capsule 0   LUMIGAN 0.01 % SOLN INSTILL 1 DROP INTO EACH EYE ONCE DAILY     methylPREDNISolone (MEDROL DOSEPAK) 4 MG TBPK tablet Take as directed on the box 21 tablet 0   mirtazapine (REMERON) 15 MG tablet Take 15 mg by mouth at bedtime.     Multiple Vitamin (MULTIVITAMIN) capsule Take by mouth.     ondansetron (ZOFRAN) 4 MG tablet Take 1 tablet (4 mg total) by mouth every 6 (six) hours. 12 tablet 0   ONETOUCH VERIO test strip USE STRIP TO CHECK GLUCOSE TWICE DAILY     potassium chloride SA (KLOR-CON M) 20 MEQ tablet Take 1 tablet by mouth twice daily 60 tablet 0   No current facility-administered medications for this visit.    REVIEW OF SYSTEMS:    10 Point review of Systems was done is negative except as noted above.   PHYSICAL EXAMINATION: ECOG PERFORMANCE STATUS: 1 - Symptomatic but completely ambulatory  Vitals:   11/13/22 1305  BP: 139/85  Pulse: 77  Resp: 16  Temp: 97.9 F (36.6 C)  SpO2: 95%   Filed Weights   11/13/22 1305  Weight: 159 lb 8 oz (72.3 kg)   GENERAL:alert, in no acute distress and comfortable SKIN: no acute rashes, no significant lesions EYES: conjunctiva are pink and non-injected, sclera anicteric OROPHARYNX: MMM, no exudates, no oropharyngeal erythema or ulceration NECK: supple, no JVD LYMPH:  no palpable lymphadenopathy in the cervical,  axillary or inguinal regions LUNGS: clear to auscultation b/l with normal respiratory effort HEART: regular rate & rhythm ABDOMEN:  normoactive bowel sounds , non tender, not distended. Extremity: no pedal edema PSYCH: alert & oriented x 3 with fluent speech NEURO: no focal motor/sensory deficits    LABORATORY DATA:      Latest Ref Rng & Units 11/13/2022   12:51 PM 08/14/2022   11:24 AM 06/04/2022    2:10 PM  CBC  WBC 4.0 - 10.5 K/uL 2.5  4.4  4.6   Hemoglobin 12.0 - 15.0 g/dL 16.1  09.6  04.5   Hematocrit 36.0 - 46.0 % 36.1  35.2  33.9   Platelets 150 - 400 K/uL 159  143  177    .    Latest Ref Rng & Units 11/13/2022   12:51 PM 08/14/2022   11:24 AM 06/04/2022    2:10 PM  CMP  Glucose 70 - 99 mg/dL 86  91  161   BUN 8 - 23 mg/dL 14  12  19    Creatinine 0.44 - 1.00 mg/dL 0.96  0.45  4.09   Sodium 135 - 145 mmol/L 142  140  141   Potassium 3.5 - 5.1 mmol/L 4.2  3.9  4.2   Chloride 98 - 111 mmol/L 113  110  108   CO2 22 - 32 mmol/L 25  25  24    Calcium 8.9 - 10.3 mg/dL 9.2  9.2  9.1   Total Protein 6.5 - 8.1 g/dL 6.6  6.4    Total Bilirubin 0.3 - 1.2 mg/dL 0.4  0.3    Alkaline Phos 38 - 126 U/L 52  51    AST 15 - 41 U/L 20  17    ALT 0 - 44 U/L 26  22      RADIOGRAPHIC STUDIES: I have personally reviewed the radiological images as listed and agreed with the findings in the report. No results found.  ASSESSMENT & PLAN:   77 y.o. female here for follow-up of her multiple myeloma and next treatment   #1 IgG kappa multiple myeloma.   Initially presented as smoldering myeloma and had M spike of 1.9 g/dL which was noted to be IgG kappa on immunofixation electrophoresis. -Bone survey shows no overt evidence of osseous involvement suggestive of multiple myeloma. Beta 2 microglobulin, sedimentation rate an LDH levels are within normal limits. -Bone marrow biopsy shows previously showed 17% monoclonal plasma cells Cytogenetics/MolCy - trisomy 41 (consistent with myeloma)    -Bone study from 09/27/17 showed some osteopenic changes without osteoporosis. Previous bone studies are not yet available  -09/08/2019 FISH Panel revealed "No Mutations Detected". -09/08/2019 Cytogenetics revealed "Normal female karyotype". -09/08/2019 BM Bx Report ([id number])  revealed "BONE MARROW, ASPIRATE, CLOT, CORE: -Hypercellular bone marrow for age with plasma cell neoplasm PERIPHERAL BLOOD: -Macrocytic anemia -Leukopenia." - 20% Plasma Cells  -09/15/2019 PET/CT ([id number]) revealed "1. No findings of active myeloma or other hypermetabolic malignancy. 2.  Aortic Atherosclerosis (ICD10-I70.0). 3. Photopenic hypodense hepatic lesions most compatible with cysts."   A treatment initiation active Multiple myeloma Bone Marrow Biopsy with nearly 60% plasma cells. With M spike of 3.6g/dl Starting to develop mild anemia (does not meet anemia criteria yet) No hypercalcemia, renal insufficiency  PET/CT 07/31/2020 - No abnormal hypermetabolism in the neck, chest, abdomen or pelvis.  -PET CT done 07/18/2021 - No hypermetabolic bone lesions suggestive of multiple myeloma.   #2  Chronic leukopenia with intermittent neutropenia. -B12 and folate levels within normal limits. -Reasonable to take a daily vitamin B complex tablet. -No indication for G-CSF at this time. -Now also additionally possibly due to Revlimid.   #3.  Mild anemia and leukopenia likely related to myeloma and Revlimid.  #4 rh/o influenza A infection in February 2023 -Patient's influenza A URI has resolved.  She was given Augmentin for secondary bacterial infection by her primary care physician.  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 CBC showed WBC of 2.5K, hemoglobin of 12.3, and platelets of 159K. -no  anemia -mild neutropenia -informed patient that WBC may fluctuate with  Revlimid -CMP normal  -last myeloma panel revealed that patient continued to be in remission with undetectable M protein -today's myeloma panel pending -no evidence of active myeloma on last blood tests, will follow with remaining labs -No notable toxicities from patient's maintenance Revlimid at this time and she will continue the same. -Patient has no signs or symptoms suggestive of multiple myeloma progression at this time -Continue to monitor with labs and subsequent dose of Zometa in 3 months  FOLLOW UP: RTC with Dr Candise Che with labs and subsequent dose of zometa in 3 months   The total time spent in the appointment was 25 minutes* .  All of the patient's questions were answered with apparent satisfaction. The patient knows to call the clinic with any problems, questions or concerns.   Wyvonnia Lora MD MS AAHIVMS [facility] Hematology/Oncology Physician [facility]  .*Total Encounter Time as defined by the Centers for Medicare and Medicaid Services includes, in addition to the face-to-face time of a patient visit (documented in the note above) non-face-to-face time: obtaining and reviewing outside history, ordering and reviewing medications, tests or procedures, care coordination (communications with other health care professionals or caregivers) and documentation in the medical record.    I,Mitra Faeizi,acting as a Neurosurgeon for Wyvonnia Lora, MD.,have documented all relevant documentation on the behalf of Wyvonnia Lora, MD,as directed by  Wyvonnia Lora, MD while in the presence of Wyvonnia Lora, MD.  .I have reviewed the above documentation for accuracy and completeness, and I agree with the above. [name] MD

## 2022-11-13 NOTE — Patient Instructions (Signed)

## 2022-11-16 ENCOUNTER — Telehealth: Payer: Self-pay | Admitting: Hematology

## 2022-11-18 NOTE — Progress Notes (Incomplete)
HEMATOLOGY/ONCOLOGY CLINIC NOTE  DOS: 11/13/22   Patient Care Team: [name], MD as PCP - General (Family Medicine) [name], MD as Consulting Physician (Hematology)  CHIEF COMPLAINTS/PURPOSE OF VISIT:  Follow-up for continued evaluation management of multiple myeloma  HISTORY OF PRESENTING ILLNESS: Please see previous note  INTERVAL HISTORY  Tracy Fisher is a 77 y.o. female here for continued evaluation and management of her multiple myeloma on maintenance Revlimid.  Patient was last seen by me on 08/14/2022 and reported recent diagnosis of carpal tunnel in her wrist, leg pain, lower back pain, and mild diarrhea.  Today, she reports that she previously endorsed cramping in her lower extremities and was given medication which she is unsure of the name of. She notes that the medication did resolve symptoms.  She reports that she is tolerating Revlimid well with no major toxicities. She denies any diarrhea or new dental pain. She denies any recent infection issues or medications changes. Patient endorses normal p.o. intake. She has been taking Vitamin D supplements  and drinking water regularly.   She reports that she did recently receive crowns on her teeth but denies any recent dental extractions.  She does complain of back pain after bending over and sometimes takes Tylenol to manage symptoms.  Oncology History  Smoldering multiple myeloma (SMM)  Multiple myeloma (HCC)  08/28/2020 Initial Diagnosis   Multiple myeloma (HCC)   09/09/2020 - 10/07/2020 Chemotherapy         01/20/2021 - 07/07/2021 Chemotherapy   Patient is on Treatment Plan : MYELOMA NEWLY DIAGNOSED TRANSPLANT CANDIDATE DaraVRd (Daratumumab IV) q21d x 6 Cycles (Induction/Consolidation)       MEDICAL HISTORY:  Past Medical History:  Diagnosis Date  . Bone cancer (HCC) 2018   started chemo pill in  2018  . Diabetes mellitus without complication (HCC)   . Hypertension   . Vocal cord cyst      SURGICAL HISTORY: Past Surgical History:  Procedure Laterality Date  . NO PAST SURGERIES      SOCIAL HISTORY: Social History   Socioeconomic History  . Marital status: Divorced    Spouse name: Not on file  . Number of children: Not on file  . Years of education: Not on file  . Highest education level: Not on file  Occupational History  . Not on file  Tobacco Use  . Smoking status: Never  . Smokeless tobacco: Never  Vaping Use  . Vaping Use: Never used  Substance and Sexual Activity  . Alcohol use: No  . Drug use: Never  . Sexual activity: Not on file  Other Topics Concern  . Not on file  Social History Narrative  . Not on file   Social Determinants of Health   Financial Resource Strain: Not on file  Food Insecurity: Not on file  Transportation Needs: Not on file  Physical Activity: Not on file  Stress: Not on file  Social Connections: Not on file  Intimate Partner Violence: Not on file    FAMILY HISTORY: Family History  Problem Relation Age of Onset  . Breast cancer Neg Hx   . Neuropathy Neg Hx     ALLERGIES:  is allergic to bortezomib and sulfa antibiotics.  MEDICATIONS:  Current Outpatient Medications  Medication Sig Dispense Refill  . acyclovir (ZOVIRAX) 400 MG tablet Take 1 tablet (400 mg total) by mouth 2 (two) times daily. 60 tablet 3  . albuterol (VENTOLIN HFA) 108 (90 Base) MCG/ACT inhaler Inhale 1-2 puffs into the  lungs every 6 (six) hours as needed for wheezing or shortness of breath. 18 g 0  . amLODipine (NORVASC) 5 MG tablet     . aspirin EC 81 MG tablet Take 81 mg by mouth daily. Swallow whole.    [name] atorvastatin (LIPITOR) 20 MG tablet     . b complex vitamins capsule Take 1 capsule by mouth daily. 30 capsule 11  . benzonatate (TESSALON) 100 MG capsule Take 1 capsule (100 mg total) by mouth every 8 (eight) hours. 21 capsule 0  . brimonidine (ALPHAGAN) 0.2 % ophthalmic solution Place 1 drop into both eyes 2 (two) times daily.   3  .  Calcium Carbonate-Vitamin D (CALCIUM + D PO) Take 1 tablet by mouth daily.    . dorzolamide-timolol (COSOPT) 22.3-6.8 MG/ML ophthalmic solution Place 1 drop into both eyes 2 (two) times daily.   12  . DULoxetine (CYMBALTA) 60 MG capsule Take 1 capsule by mouth once daily 30 capsule 0  . gabapentin (NEURONTIN) 300 MG capsule TAKE 1 CAPSULE BY MOUTH THREE TIMES DAILY 90 capsule 0  . HYDROcodone-acetaminophen (NORCO) 5-325 MG tablet Take 1 tablet by mouth every 6 (six) hours as needed for moderate pain. 60 tablet 0  . lenalidomide (REVLIMID) 10 MG capsule Take 1 capsule (10 mg total) by mouth daily. Take 21 days on, 7 days off, repeat every 28 days. 21 capsule 0  . LUMIGAN 0.01 % SOLN INSTILL 1 DROP INTO EACH EYE ONCE DAILY    . methylPREDNISolone (MEDROL DOSEPAK) 4 MG TBPK tablet Take as directed on the box 21 tablet 0  . mirtazapine (REMERON) 15 MG tablet Take 15 mg by mouth at bedtime.    . Multiple Vitamin (MULTIVITAMIN) capsule Take by mouth.    . ondansetron (ZOFRAN) 4 MG tablet Take 1 tablet (4 mg total) by mouth every 6 (six) hours. 12 tablet 0  . ONETOUCH VERIO test strip USE STRIP TO CHECK GLUCOSE TWICE DAILY    . potassium chloride SA (KLOR-CON M) 20 MEQ tablet Take 1 tablet by mouth twice daily 60 tablet 0   No current facility-administered medications for this visit.    REVIEW OF SYSTEMS:    10 Point review of Systems was done is negative except as noted above.   PHYSICAL EXAMINATION: ECOG PERFORMANCE STATUS: 1 - Symptomatic but completely ambulatory  Vitals:   11/13/22 1305  BP: 139/85  Pulse: 77  Resp: 16  Temp: 97.9 F (36.6 C)  SpO2: 95%   Filed Weights   11/13/22 1305  Weight: 159 lb 8 oz (72.3 kg)   GENERAL:alert, in no acute distress and comfortable SKIN: no acute rashes, no significant lesions EYES: conjunctiva are pink and non-injected, sclera anicteric OROPHARYNX: MMM, no exudates, no oropharyngeal erythema or ulceration NECK: supple, no JVD LYMPH:  no  palpable lymphadenopathy in the cervical, axillary or inguinal regions LUNGS: clear to auscultation b/l with normal respiratory effort HEART: regular rate & rhythm ABDOMEN:  normoactive bowel sounds , non tender, not distended. Extremity: no pedal edema PSYCH: alert & oriented x 3 with fluent speech NEURO: no focal motor/sensory deficits    LABORATORY DATA:      Latest Ref Rng & Units 11/13/2022   12:51 PM 08/14/2022   11:24 AM 06/04/2022    2:10 PM  CBC  WBC 4.0 - 10.5 K/uL 2.5  4.4  4.6   Hemoglobin 12.0 - 15.0 g/dL 16.1  09.6  04.5   Hematocrit 36.0 - 46.0 % 36.1  35.2  33.9   Platelets 150 - 400 K/uL 159  143  177    .    Latest Ref Rng & Units 11/13/2022   12:51 PM 08/14/2022   11:24 AM 06/04/2022    2:10 PM  CMP  Glucose 70 - 99 mg/dL 86  91  409   BUN 8 - 23 mg/dL 14  12  19    Creatinine 0.44 - 1.00 mg/dL 8.11  9.14  7.82   Sodium 135 - 145 mmol/L 142  140  141   Potassium 3.5 - 5.1 mmol/L 4.2  3.9  4.2   Chloride 98 - 111 mmol/L 113  110  108   CO2 22 - 32 mmol/L 25  25  24    Calcium 8.9 - 10.3 mg/dL 9.2  9.2  9.1   Total Protein 6.5 - 8.1 g/dL 6.6  6.4    Total Bilirubin 0.3 - 1.2 mg/dL 0.4  0.3    Alkaline Phos 38 - 126 U/L 52  51    AST 15 - 41 U/L 20  17    ALT 0 - 44 U/L 26  22      RADIOGRAPHIC STUDIES: I have personally reviewed the radiological images as listed and agreed with the findings in the report. No results found.  ASSESSMENT & PLAN:   77 y.o. female here for follow-up of her multiple myeloma and next treatment   #1 IgG kappa multiple myeloma.   Initially presented as smoldering myeloma and had M spike of 1.9 g/dL which was noted to be IgG kappa on immunofixation electrophoresis. -Bone survey shows no overt evidence of osseous involvement suggestive of multiple myeloma. Beta 2 microglobulin, sedimentation rate an LDH levels are within normal limits. -Bone marrow biopsy shows previously showed 17% monoclonal plasma cells Cytogenetics/MolCy -  trisomy 71 (consistent with myeloma)   -Bone study from 09/27/17 showed some osteopenic changes without osteoporosis. Previous bone studies are not yet available  -09/08/2019 FISH Panel revealed "No Mutations Detected". -09/08/2019 Cytogenetics revealed "Normal female karyotype". -09/08/2019 BM Bx Report ([id number])  revealed "BONE MARROW, ASPIRATE, CLOT, CORE: -Hypercellular bone marrow for age with plasma cell neoplasm PERIPHERAL BLOOD: -Macrocytic anemia -Leukopenia." - 20% Plasma Cells  -09/15/2019 PET/CT ([id number]) revealed "1. No findings of active myeloma or other hypermetabolic malignancy. 2.  Aortic Atherosclerosis (ICD10-I70.0). 3. Photopenic hypodense hepatic lesions most compatible with cysts."   A treatment initiation active Multiple myeloma Bone Marrow Biopsy with nearly 60% plasma cells. With M spike of 3.6g/dl Starting to develop mild anemia (does not meet anemia criteria yet) No hypercalcemia, renal insufficiency  PET/CT 07/31/2020 - No abnormal hypermetabolism in the neck, chest, abdomen or pelvis.  -PET CT done 07/18/2021 - No hypermetabolic bone lesions suggestive of multiple myeloma.   #2  Chronic leukopenia with intermittent neutropenia. -B12 and folate levels within normal limits. -Reasonable to take a daily vitamin B complex tablet. -No indication for G-CSF at this time. -Now also additionally possibly due to Revlimid.   #3.  Mild anemia and leukopenia likely related to myeloma and Revlimid.  #4 rh/o influenza A infection in February 2023 -Patient's influenza A URI has resolved.  She was given Augmentin for secondary bacterial infection by her primary care physician.  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 CBC showed WBC of 2.5K, hemoglobin of 12.3, and platelets of 159K. -no  anemia -mild neutropenia -informed patient  that WBC may fluctuate with Revlimid -CMP normal  -last myeloma panel revealed that patient continued to be in remission with undetectable M protein -today's myeloma panel pending -no evidence of active myeloma on last blood tests, will follow with remaining labs -No notable toxicities from patient's maintenance Revlimid at this time and she will continue the same. -Patient has no signs or symptoms suggestive of multiple myeloma progression at this time -Continue to monitor with labs and subsequent dose of Zometa in 3 months  FOLLOW UP: RTC with Dr Candise Che with labs and subsequent dose of zometa in 3 months   The total time spent in the appointment was *** minutes* .  All of the patient's questions were answered with apparent satisfaction. The patient knows to call the clinic with any problems, questions or concerns.   Wyvonnia Lora MD MS AAHIVMS [facility] Hematology/Oncology Physician [facility]  .*Total Encounter Time as defined by the Centers for Medicare and Medicaid Services includes, in addition to the face-to-face time of a patient visit (documented in the note above) non-face-to-face time: obtaining and reviewing outside history, ordering and reviewing medications, tests or procedures, care coordination (communications with other health care professionals or caregivers) and documentation in the medical record.    I,Mitra Faeizi,acting as a Neurosurgeon for Wyvonnia Lora, MD.,have documented all relevant documentation on the behalf of Wyvonnia Lora, MD,as directed by  Wyvonnia Lora, MD while in the presence of Wyvonnia Lora, MD.  ***

## 2022-11-19 ENCOUNTER — Encounter: Payer: Self-pay | Admitting: Hematology

## 2022-11-24 ENCOUNTER — Other Ambulatory Visit: Payer: Self-pay

## 2022-11-24 ENCOUNTER — Other Ambulatory Visit: Payer: Self-pay | Admitting: Hematology

## 2022-11-24 DIAGNOSIS — C9000 Multiple myeloma not having achieved remission: Secondary | ICD-10-CM

## 2022-11-24 LAB — MULTIPLE MYELOMA PANEL, SERUM
Albumin SerPl Elph-Mcnc: 3.7 g/dL (ref 2.9–4.4)
Albumin/Glob SerPl: 1.5 (ref 0.7–1.7)
Alpha 1: 0.2 g/dL (ref 0.0–0.4)
Alpha2 Glob SerPl Elph-Mcnc: 0.7 g/dL (ref 0.4–1.0)
B-Globulin SerPl Elph-Mcnc: 0.8 g/dL (ref 0.7–1.3)
Gamma Glob SerPl Elph-Mcnc: 0.8 g/dL (ref 0.4–1.8)
Globulin, Total: 2.6 g/dL (ref 2.2–3.9)
IgA: 89 mg/dL (ref 64–422)
IgG (Immunoglobin G), Serum: 911 mg/dL (ref 586–1602)
IgM (Immunoglobulin M), Srm: 45 mg/dL (ref 26–217)
Total Protein ELP: 6.3 g/dL (ref 6.0–8.5)

## 2022-11-24 MED ORDER — LENALIDOMIDE 10 MG PO CAPS
10.0000 mg | ORAL_CAPSULE | Freq: Every day | ORAL | 0 refills | Status: DC
Start: 2022-11-24 — End: 2022-12-17

## 2022-11-28 DIAGNOSIS — S29011A Strain of muscle and tendon of front wall of thorax, initial encounter: Secondary | ICD-10-CM | POA: Diagnosis not present

## 2022-12-07 ENCOUNTER — Other Ambulatory Visit: Payer: Self-pay | Admitting: Hematology

## 2022-12-07 DIAGNOSIS — C9000 Multiple myeloma not having achieved remission: Secondary | ICD-10-CM

## 2022-12-10 ENCOUNTER — Other Ambulatory Visit: Payer: Self-pay | Admitting: Hematology

## 2022-12-10 ENCOUNTER — Other Ambulatory Visit: Payer: Self-pay | Admitting: Family Medicine

## 2022-12-10 DIAGNOSIS — Z0000 Encounter for general adult medical examination without abnormal findings: Secondary | ICD-10-CM

## 2022-12-10 DIAGNOSIS — C9000 Multiple myeloma not having achieved remission: Secondary | ICD-10-CM

## 2022-12-17 ENCOUNTER — Other Ambulatory Visit: Payer: Self-pay

## 2022-12-17 DIAGNOSIS — C9000 Multiple myeloma not having achieved remission: Secondary | ICD-10-CM

## 2022-12-17 MED ORDER — LENALIDOMIDE 10 MG PO CAPS
10.0000 mg | ORAL_CAPSULE | Freq: Every day | ORAL | 0 refills | Status: DC
Start: 2022-12-17 — End: 2023-01-13

## 2022-12-23 ENCOUNTER — Other Ambulatory Visit: Payer: Self-pay | Admitting: Hematology

## 2022-12-23 DIAGNOSIS — M25562 Pain in left knee: Secondary | ICD-10-CM | POA: Diagnosis not present

## 2022-12-23 DIAGNOSIS — S8012XA Contusion of left lower leg, initial encounter: Secondary | ICD-10-CM | POA: Diagnosis not present

## 2022-12-23 DIAGNOSIS — C9000 Multiple myeloma not having achieved remission: Secondary | ICD-10-CM

## 2022-12-30 ENCOUNTER — Other Ambulatory Visit: Payer: Self-pay

## 2022-12-30 DIAGNOSIS — C9000 Multiple myeloma not having achieved remission: Secondary | ICD-10-CM

## 2022-12-30 MED ORDER — ACYCLOVIR 400 MG PO TABS
400.0000 mg | ORAL_TABLET | Freq: Two times a day (BID) | ORAL | 3 refills | Status: DC
Start: 2022-12-30 — End: 2023-10-12

## 2022-12-30 MED ORDER — GABAPENTIN 300 MG PO CAPS
300.0000 mg | ORAL_CAPSULE | Freq: Three times a day (TID) | ORAL | 0 refills | Status: DC
Start: 2022-12-30 — End: 2023-04-14

## 2023-01-05 ENCOUNTER — Other Ambulatory Visit: Payer: Self-pay

## 2023-01-05 ENCOUNTER — Other Ambulatory Visit: Payer: Self-pay | Admitting: Hematology

## 2023-01-05 DIAGNOSIS — C9000 Multiple myeloma not having achieved remission: Secondary | ICD-10-CM

## 2023-01-05 MED ORDER — POTASSIUM CHLORIDE CRYS ER 20 MEQ PO TBCR
EXTENDED_RELEASE_TABLET | ORAL | 0 refills | Status: DC
Start: 2023-01-05 — End: 2023-01-05

## 2023-01-07 DIAGNOSIS — F325 Major depressive disorder, single episode, in full remission: Secondary | ICD-10-CM | POA: Diagnosis not present

## 2023-01-07 DIAGNOSIS — E119 Type 2 diabetes mellitus without complications: Secondary | ICD-10-CM | POA: Diagnosis not present

## 2023-01-07 DIAGNOSIS — C9001 Multiple myeloma in remission: Secondary | ICD-10-CM | POA: Diagnosis not present

## 2023-01-07 DIAGNOSIS — G620 Drug-induced polyneuropathy: Secondary | ICD-10-CM | POA: Diagnosis not present

## 2023-01-07 DIAGNOSIS — E785 Hyperlipidemia, unspecified: Secondary | ICD-10-CM | POA: Diagnosis not present

## 2023-01-07 DIAGNOSIS — E1169 Type 2 diabetes mellitus with other specified complication: Secondary | ICD-10-CM | POA: Diagnosis not present

## 2023-01-07 DIAGNOSIS — I10 Essential (primary) hypertension: Secondary | ICD-10-CM | POA: Diagnosis not present

## 2023-01-07 DIAGNOSIS — R29818 Other symptoms and signs involving the nervous system: Secondary | ICD-10-CM | POA: Diagnosis not present

## 2023-01-07 DIAGNOSIS — H401123 Primary open-angle glaucoma, left eye, severe stage: Secondary | ICD-10-CM | POA: Diagnosis not present

## 2023-01-07 DIAGNOSIS — F419 Anxiety disorder, unspecified: Secondary | ICD-10-CM | POA: Diagnosis not present

## 2023-01-13 ENCOUNTER — Other Ambulatory Visit: Payer: Self-pay

## 2023-01-13 DIAGNOSIS — C9000 Multiple myeloma not having achieved remission: Secondary | ICD-10-CM

## 2023-01-13 MED ORDER — LENALIDOMIDE 10 MG PO CAPS
10.0000 mg | ORAL_CAPSULE | Freq: Every day | ORAL | 0 refills | Status: DC
Start: 2023-01-13 — End: 2023-02-12

## 2023-01-16 IMAGING — MG MM DIGITAL SCREENING BILAT W/ TOMO AND CAD
8 series · 8 of 24 positions shown · non-contrast
Comparison: Previous exam(s).

CLINICAL DATA: Screening.

EXAM:
DIGITAL SCREENING BILATERAL MAMMOGRAM WITH TOMOSYNTHESIS AND CAD
TECHNIQUE: Bilateral screening digital craniocaudal and mediolateral oblique
mammograms were obtained. Bilateral screening digital breast
tomosynthesis was performed. The images were evaluated with
computer-aided detection.

[R MLO synth-2D]
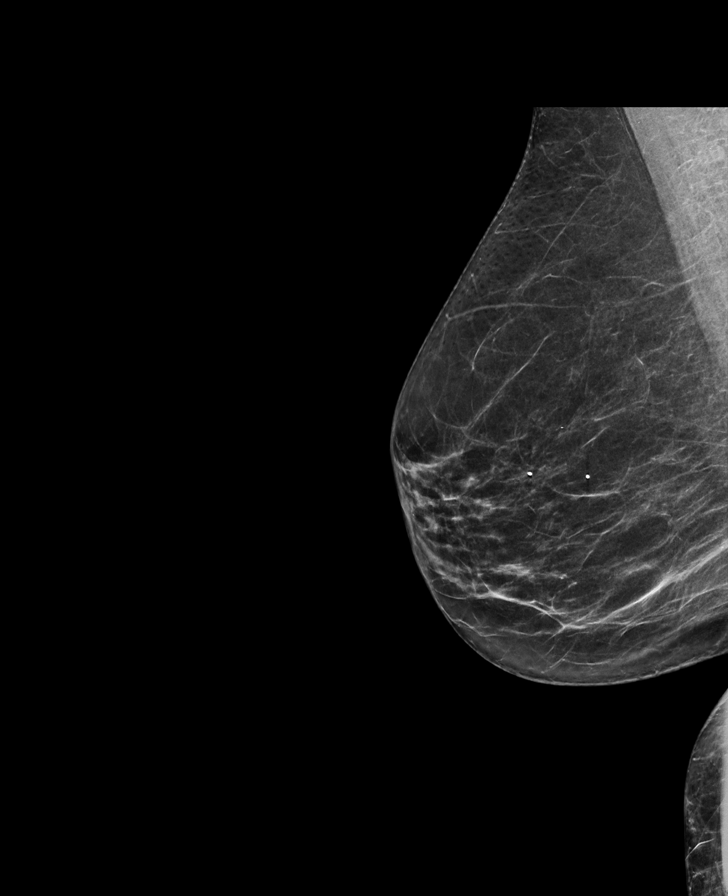

[L MLO synth-2D]
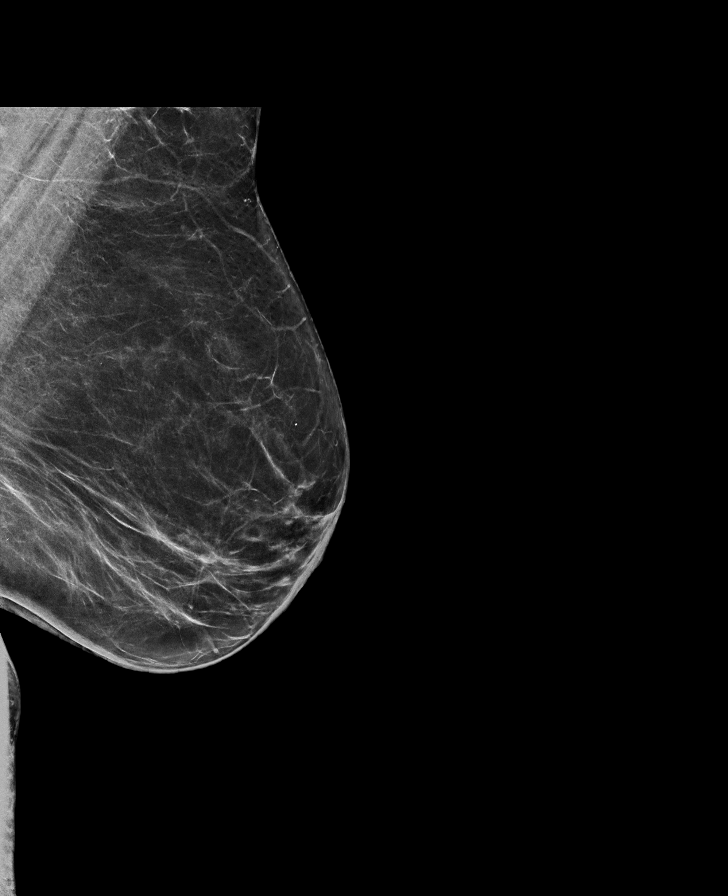

[R CC synth-2D]
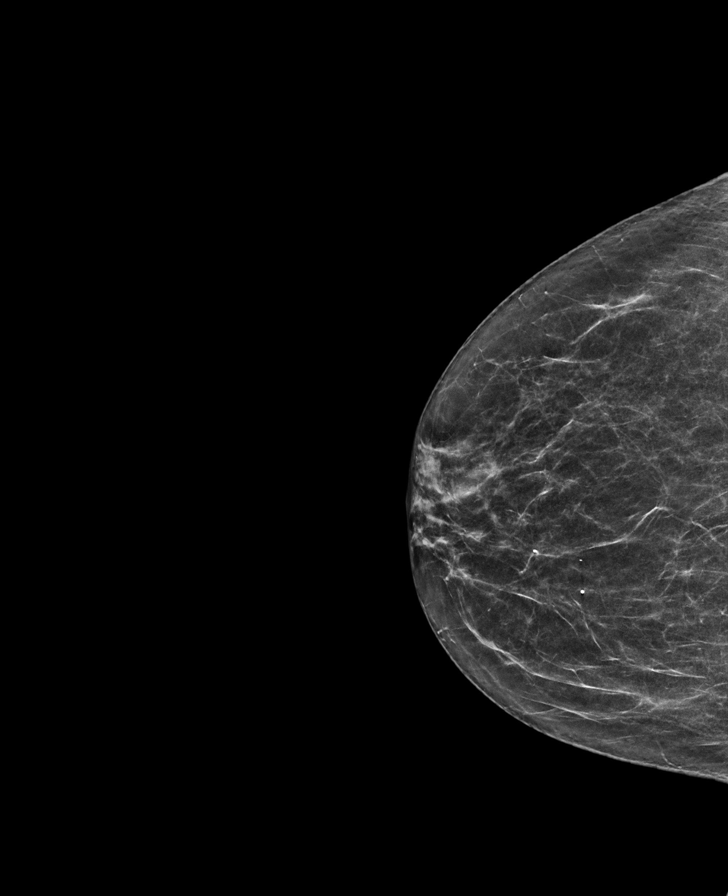

[L CC synth-2D]
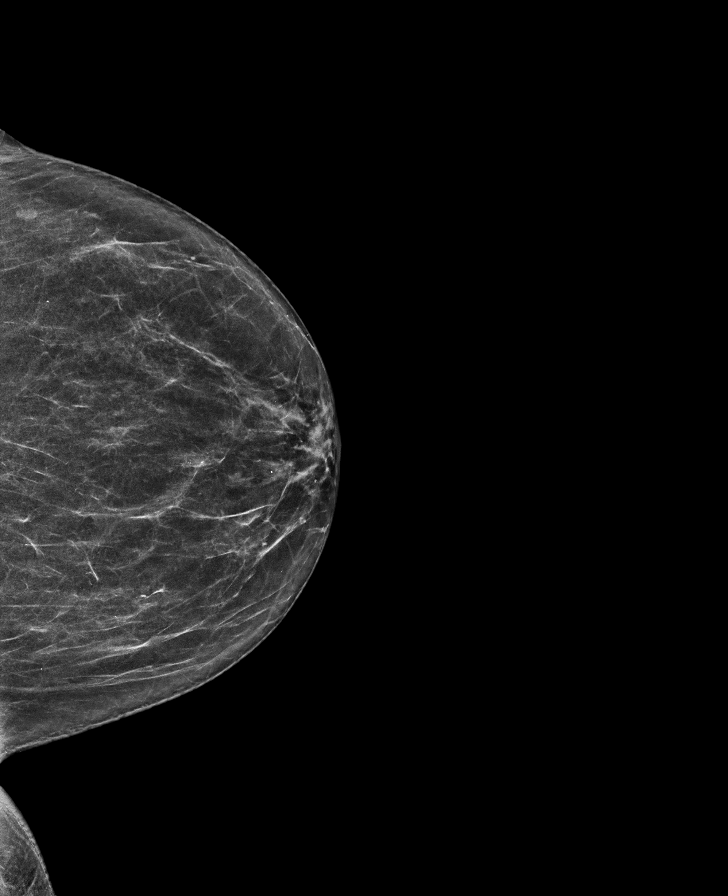

[R MLO tomo · tomo slice 31/62.0]
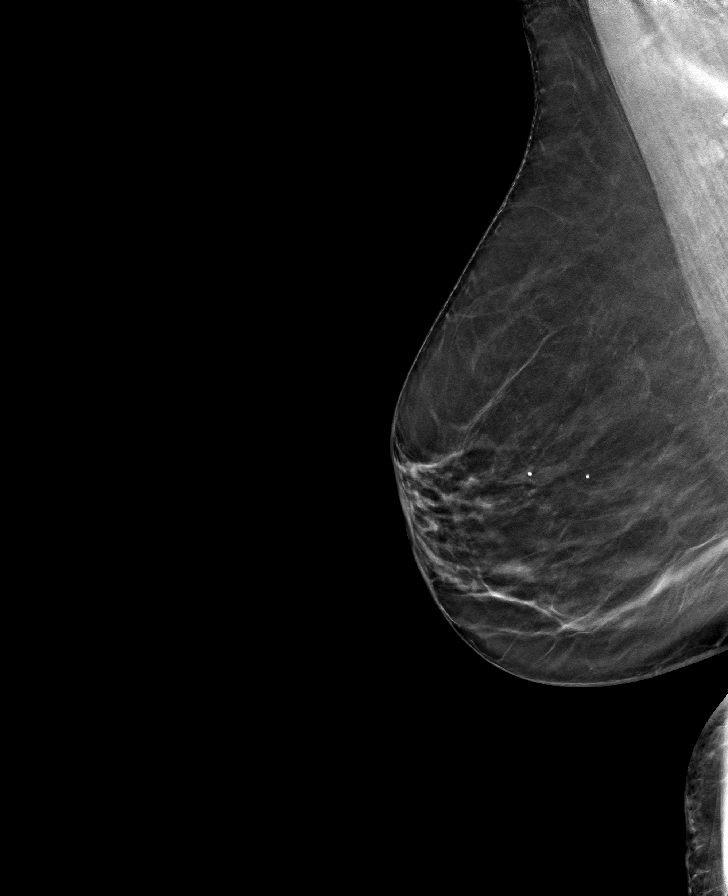

[L MLO tomo · tomo slice 37/74.0]
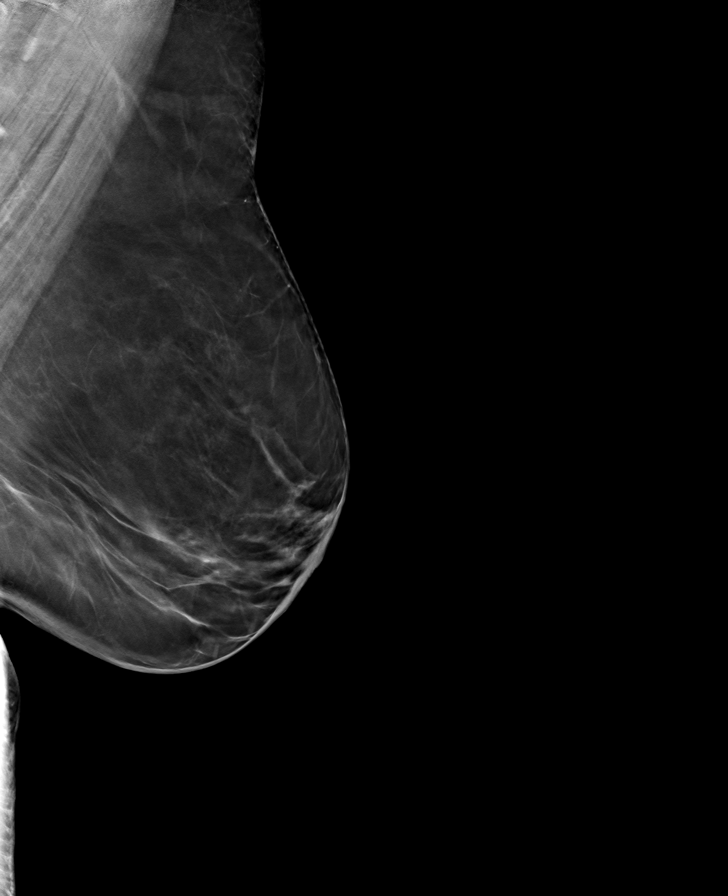

[R CC tomo · tomo slice 30/59.0]
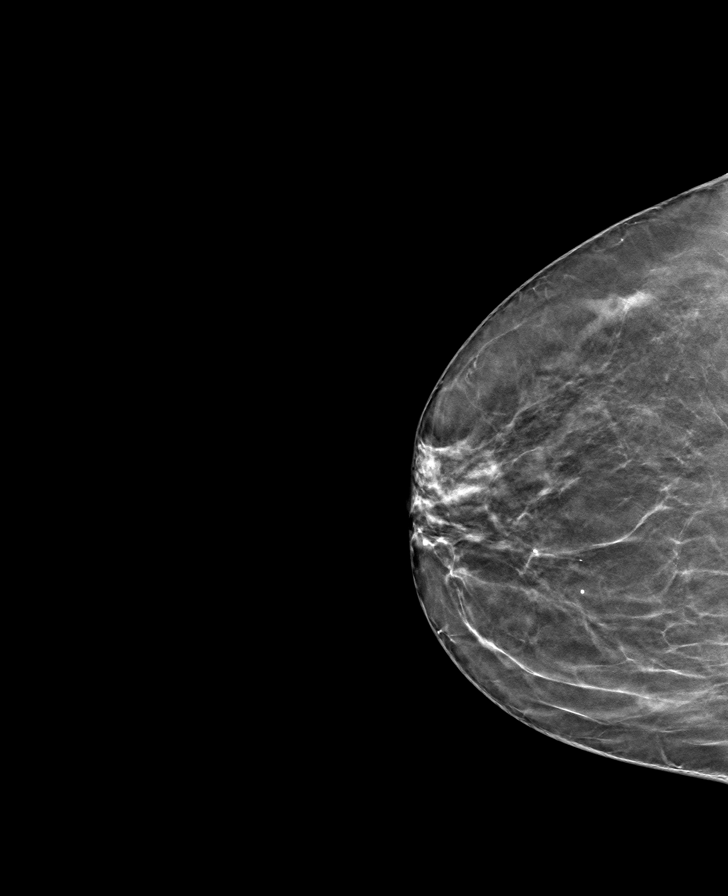

[L CC tomo · tomo slice 31/62.0]
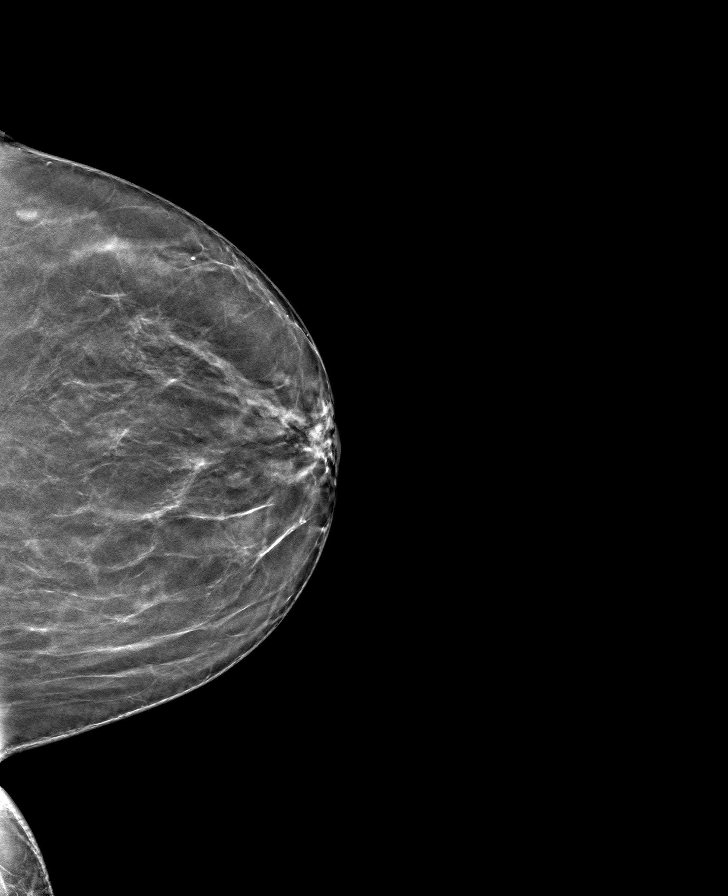

[8 of 24 positions shown; findings below may reference images not displayed]

ACR Breast Density Category b: There are scattered areas of
fibroglandular density.
FINDINGS: There are no findings suspicious for malignancy.
IMPRESSION: No mammographic evidence of malignancy. A result letter of this
screening mammogram will be mailed directly to the patient.

RECOMMENDATION:
Screening mammogram in one year. (Code:51-O-LD2)

BI-RADS CATEGORY  1: Negative.

## 2023-01-18 ENCOUNTER — Ambulatory Visit
Admission: RE | Admit: 2023-01-18 | Discharge: 2023-01-18 | Disposition: A | Discharge status: Home / Self Care | Payer: Medicare HMO | Source: Ambulatory Visit | Attending: Family Medicine | Admitting: Family Medicine

## 2023-01-18 DIAGNOSIS — Z1231 Encounter for screening mammogram for malignant neoplasm of breast: Secondary | ICD-10-CM | POA: Diagnosis not present

## 2023-01-18 DIAGNOSIS — Z0000 Encounter for general adult medical examination without abnormal findings: Secondary | ICD-10-CM

## 2023-01-19 DIAGNOSIS — M1712 Unilateral primary osteoarthritis, left knee: Secondary | ICD-10-CM | POA: Diagnosis not present

## 2023-01-26 ENCOUNTER — Other Ambulatory Visit: Payer: Self-pay | Admitting: Hematology

## 2023-01-26 DIAGNOSIS — C9000 Multiple myeloma not having achieved remission: Secondary | ICD-10-CM

## 2023-02-04 ENCOUNTER — Other Ambulatory Visit: Payer: Self-pay | Admitting: Hematology

## 2023-02-04 ENCOUNTER — Other Ambulatory Visit: Payer: Self-pay

## 2023-02-04 DIAGNOSIS — D472 Monoclonal gammopathy: Secondary | ICD-10-CM

## 2023-02-05 ENCOUNTER — Inpatient Hospital Stay: Payer: Medicare HMO

## 2023-02-05 ENCOUNTER — Inpatient Hospital Stay: Payer: Medicare HMO | Attending: Hematology

## 2023-02-05 ENCOUNTER — Inpatient Hospital Stay: Payer: Medicare HMO | Admitting: Hematology

## 2023-02-05 ENCOUNTER — Other Ambulatory Visit: Payer: Self-pay

## 2023-02-05 VITALS — BP 142/78 | HR 69 | Temp 98.4°F | Resp 16 | Wt 163.8 lb

## 2023-02-05 VITALS — BP 123/90 | HR 76 | Resp 16

## 2023-02-05 DIAGNOSIS — D709 Neutropenia, unspecified: Secondary | ICD-10-CM

## 2023-02-05 DIAGNOSIS — M8580 Other specified disorders of bone density and structure, unspecified site: Secondary | ICD-10-CM | POA: Insufficient documentation

## 2023-02-05 DIAGNOSIS — Z79899 Other long term (current) drug therapy: Secondary | ICD-10-CM | POA: Diagnosis not present

## 2023-02-05 DIAGNOSIS — D472 Monoclonal gammopathy: Secondary | ICD-10-CM

## 2023-02-05 DIAGNOSIS — Z79624 Long term (current) use of inhibitors of nucleotide synthesis: Secondary | ICD-10-CM | POA: Insufficient documentation

## 2023-02-05 DIAGNOSIS — C9001 Multiple myeloma in remission: Secondary | ICD-10-CM

## 2023-02-05 DIAGNOSIS — M8589 Other specified disorders of bone density and structure, multiple sites: Secondary | ICD-10-CM

## 2023-02-05 DIAGNOSIS — D72819 Decreased white blood cell count, unspecified: Secondary | ICD-10-CM | POA: Insufficient documentation

## 2023-02-05 DIAGNOSIS — Z7982 Long term (current) use of aspirin: Secondary | ICD-10-CM | POA: Diagnosis not present

## 2023-02-05 DIAGNOSIS — Z7189 Other specified counseling: Secondary | ICD-10-CM

## 2023-02-05 DIAGNOSIS — C9000 Multiple myeloma not having achieved remission: Secondary | ICD-10-CM | POA: Diagnosis not present

## 2023-02-05 DIAGNOSIS — Z7961 Long term (current) use of immunomodulator: Secondary | ICD-10-CM | POA: Insufficient documentation

## 2023-02-05 DIAGNOSIS — D649 Anemia, unspecified: Secondary | ICD-10-CM | POA: Diagnosis not present

## 2023-02-05 LAB — CBC WITH DIFFERENTIAL (CANCER CENTER ONLY)
Abs Immature Granulocytes: 0.04 10*3/uL (ref 0.00–0.07)
Basophils Absolute: 0.1 10*3/uL (ref 0.0–0.1)
Basophils Relative: 2 %
Eosinophils Absolute: 0.2 10*3/uL (ref 0.0–0.5)
Eosinophils Relative: 6 %
HCT: 35.9 % — ABNORMAL LOW (ref 36.0–46.0)
Hemoglobin: 12.1 g/dL (ref 12.0–15.0)
Immature Granulocytes: 2 %
Lymphocytes Relative: 50 %
Lymphs Abs: 1.3 10*3/uL (ref 0.7–4.0)
MCH: 32.6 pg (ref 26.0–34.0)
MCHC: 33.7 g/dL (ref 30.0–36.0)
MCV: 96.8 fL (ref 80.0–100.0)
Monocytes Absolute: 0.3 10*3/uL (ref 0.1–1.0)
Monocytes Relative: 12 %
Neutro Abs: 0.8 10*3/uL — ABNORMAL LOW (ref 1.7–7.7)
Neutrophils Relative %: 28 %
Platelet Count: 129 10*3/uL — ABNORMAL LOW (ref 150–400)
RBC: 3.71 MIL/uL — ABNORMAL LOW (ref 3.87–5.11)
RDW: 14.6 % (ref 11.5–15.5)
WBC Count: 2.7 10*3/uL — ABNORMAL LOW (ref 4.0–10.5)
nRBC: 0 % (ref 0.0–0.2)

## 2023-02-05 LAB — CMP (CANCER CENTER ONLY)
ALT: 25 U/L (ref 0–44)
AST: 17 U/L (ref 15–41)
Albumin: 4 g/dL (ref 3.5–5.0)
Alkaline Phosphatase: 46 U/L (ref 38–126)
Anion gap: 7 (ref 5–15)
BUN: 13 mg/dL (ref 8–23)
CO2: 22 mmol/L (ref 22–32)
Calcium: 8.6 mg/dL — ABNORMAL LOW (ref 8.9–10.3)
Chloride: 112 mmol/L — ABNORMAL HIGH (ref 98–111)
Creatinine: 0.7 mg/dL (ref 0.44–1.00)
GFR, Estimated: >60 mL/min (ref >60)
Glucose, Bld: 112 mg/dL — ABNORMAL HIGH (ref 70–99)
Potassium: 3.6 mmol/L (ref 3.5–5.1)
Sodium: 141 mmol/L (ref 135–145)
Total Bilirubin: 0.3 mg/dL (ref 0.3–1.2)
Total Protein: 6.3 g/dL — ABNORMAL LOW (ref 6.5–8.1)

## 2023-02-05 MED ORDER — SODIUM CHLORIDE 0.9 % IV SOLN: Freq: Once | INTRAVENOUS | Status: AC

## 2023-02-05 MED ORDER — ZOLEDRONIC ACID 4 MG/100ML IV SOLN
4.0000 mg | Freq: Once | INTRAVENOUS | Status: AC
  Administered 2023-02-05: 4 mg via INTRAVENOUS
  Filled 2023-02-05: qty 100

## 2023-02-05 NOTE — Progress Notes (Signed)
Per Dr. Candise Che - okay to proceed with zometa today with calcium of 8.6 and creatinine of 0.7.  Patient states that she does not have any upcoming dental procedures.

## 2023-02-05 NOTE — Progress Notes (Signed)
Patient seen by Dr. Addison Naegeli are within treatment parameters.  Labs reviewed: and are not all within treatment parameters. Dr Candise Che aware ANC 0.8,  CA 8.6,   Per physician team, patient is ready for treatment and there are NO modifications to the treatment plan.

## 2023-02-05 NOTE — Patient Instructions (Signed)

## 2023-02-05 NOTE — Progress Notes (Signed)
HEMATOLOGY/ONCOLOGY CLINIC NOTE  DOS: 02/05/23   Patient Care Team: [name], MD as PCP - General (Family Medicine) [name], MD as Consulting Physician (Hematology)  CHIEF COMPLAINTS/PURPOSE OF VISIT:  Follow-up for continued evaluation management of multiple myeloma  HISTORY OF PRESENTING ILLNESS: Please see previous note  INTERVAL HISTORY  Tracy Fisher is a 77 y.o. female here for continued evaluation and management of her multiple myeloma on maintenance Revlimid.  Patient was last seen by me on 11/13/2022 and complained of back pain after bending. She noted that she did previously endorse lower extremity cramping which had resolved.  Today, she reports that she has been doing well overall since her last clinical visit. She has been tolerating Revlimid well with no toxicity issues.   Patient reports discoloration in her right foot which she attributes to Revlimid. She reports that her right big toe nail had broken and had been glued back in place. Patient also notes that her right ring fingernail had also broken off.   She denies any infection issues, change in bowel habits, abdominal pain, or new dental issues. Patient does endorse back pain sometimes when bending which requires her to rest for some time.  She denies any other bone pains.   She does endorse carpal tunnel and does use a splint regularly throughout the day. Patient reports that surgery was discussed with her as a potential interventional procedure.  She reports that she sometimes experiences tingling in her bilateral hands. Patient denies engaging in any frequent irregular activities involving her hands.  Patient endorses normal eating habit and does not take any multivitamins. She reports that she functions well independently at home.   She did have a recent mamogram which showed normal results. Patient does have a colonoscopy scheduled soon.   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[facility])  Multiple myeloma (HCC)  08/28/2020 Initial Diagnosis   Multiple myeloma (HCC)   09/09/2020 - 10/07/2020 Chemotherapy         01/20/2021 - 07/07/2021 Chemotherapy   Patient is on Treatment Plan : MYELOMA NEWLY DIAGNOSED TRANSPLANT CANDIDATE DaraVRd (Daratumumab IV) q21d x 6 Cycles (Induction/Consolidation)       MEDICAL HISTORY:  Past Medical History:  Diagnosis Date   Bone cancer (HCC) 2018   started chemo pill in  2018   Diabetes mellitus without complication (HCC)    Hypertension    Vocal cord cyst     SURGICAL HISTORY: Past Surgical History:  Procedure Laterality Date   NO PAST SURGERIES      SOCIAL HISTORY: Social History   Socioeconomic History   Marital status: Divorced    Spouse name: Not on file   Number of children: Not on file   Years of education: Not on file   Highest education level: Not on file  Occupational History   Not on file  Tobacco Use   Smoking status: Never   Smokeless tobacco: Never  Vaping Use   Vaping status: Never Used  Substance and Sexual Activity   Alcohol use: No   Drug use: Never   Sexual activity: Not on file  Other Topics Concern   Not on file  Social History Narrative   Not on file   Social Determinants of Health   Financial Resource Strain: Not on file  Food Insecurity: Not on file  Transportation Needs: Not on file  Physical Activity: Not on file  Stress: Not on file  Social Connections: Not on file  Intimate Partner Violence: Not  on file    FAMILY HISTORY: Family History  Problem Relation Age of Onset   Breast cancer Neg Hx    Neuropathy Neg Hx     ALLERGIES:  is allergic to bortezomib and sulfa antibiotics.  MEDICATIONS:  Current Outpatient Medications  Medication Sig Dispense Refill   acyclovir (ZOVIRAX) 400 MG tablet Take 1 tablet (400 mg total) by mouth 2 (two) times daily. 60 tablet 3   albuterol (VENTOLIN HFA) 108 (90 Base) MCG/ACT inhaler Inhale 1-2 puffs into the lungs every 6 (six) hours as  needed for wheezing or shortness of breath. 18 g 0   amLODipine (NORVASC) 5 MG tablet      aspirin EC 81 MG tablet Take 81 mg by mouth daily. Swallow whole.     atorvastatin (LIPITOR) 20 MG tablet      b complex vitamins capsule Take 1 capsule by mouth daily. 30 capsule 11   benzonatate (TESSALON) 100 MG capsule Take 1 capsule (100 mg total) by mouth every 8 (eight) hours. 21 capsule 0   brimonidine (ALPHAGAN) 0.2 % ophthalmic solution Place 1 drop into both eyes 2 (two) times daily.   3   Calcium Carbonate-Vitamin D (CALCIUM + D PO) Take 1 tablet by mouth daily.     dorzolamide-timolol (COSOPT) 22.3-6.8 MG/ML ophthalmic solution Place 1 drop into both eyes 2 (two) times daily.   12   DULoxetine (CYMBALTA) 60 MG capsule Take 1 capsule by mouth once daily 30 capsule 0   gabapentin (NEURONTIN) 300 MG capsule Take 1 capsule (300 mg total) by mouth 3 (three) times daily. 90 capsule 0   HYDROcodone-acetaminophen (NORCO) 5-325 MG tablet Take 1 tablet by mouth every 6 (six) hours as needed for moderate pain. 60 tablet 0   lenalidomide (REVLIMID) 10 MG capsule Take 1 capsule (10 mg total) by mouth daily. Take 21 days on, 7 days off, repeat every 28 days. 21 capsule 0   LUMIGAN 0.01 % SOLN INSTILL 1 DROP INTO EACH EYE ONCE DAILY     methylPREDNISolone (MEDROL DOSEPAK) 4 MG TBPK tablet Take as directed on the box 21 tablet 0   mirtazapine (REMERON) 15 MG tablet Take 15 mg by mouth at bedtime.     Multiple Vitamin (MULTIVITAMIN) capsule Take by mouth.     ondansetron (ZOFRAN) 4 MG tablet Take 1 tablet (4 mg total) by mouth every 6 (six) hours. 12 tablet 0   ONETOUCH VERIO test strip USE STRIP TO CHECK GLUCOSE TWICE DAILY     potassium chloride SA (KLOR-CON M) 20 MEQ tablet TAKE 1  BY MOUTH TWICE DAILY 60 tablet 0   No current facility-administered medications for this visit.    REVIEW OF SYSTEMS:    10 Point review of Systems was done is negative except as noted above.   PHYSICAL EXAMINATION: ECOG  PERFORMANCE STATUS: 1 - Symptomatic but completely ambulatory  Vitals:   02/05/23 0943  BP: (!) 142/78  Pulse: 69  Resp: 16  Temp: 98.4 F (36.9 C)  SpO2: 98%    Filed Weights   02/05/23 0943  Weight: 163 lb 12.8 oz (74.3 kg)   GENERAL:alert, in no acute distress and comfortable SKIN: no acute rashes, no significant lesions EYES: conjunctiva are pink and non-injected, sclera anicteric OROPHARYNX: MMM, no exudates, no oropharyngeal erythema or ulceration NECK: supple, no JVD LYMPH:  no palpable lymphadenopathy in the cervical, axillary or inguinal regions LUNGS: clear to auscultation b/l with normal respiratory effort HEART: regular rate & rhythm ABDOMEN:  normoactive bowel sounds , non tender, not distended. Extremity: no pedal edema PSYCH: alert & oriented x 3 with fluent speech NEURO: no focal motor/sensory deficits    LABORATORY DATA:      Latest Ref Rng & Units 02/05/2023    9:17 AM 11/13/2022   12:51 PM 08/14/2022   11:24 AM  CBC  WBC 4.0 - 10.5 K/uL 2.7  2.5  4.4   Hemoglobin 12.0 - 15.0 g/dL 16.1  09.6  04.5   Hematocrit 36.0 - 46.0 % 35.9  36.1  35.2   Platelets 150 - 400 K/uL 129  159  143    .    Latest Ref Rng & Units 02/05/2023    9:17 AM 11/13/2022   12:51 PM 08/14/2022   11:24 AM  CMP  Glucose 70 - 99 mg/dL 409  86  91   BUN 8 - 23 mg/dL 13  14  12    Creatinine 0.44 - 1.00 mg/dL 8.11  9.14  7.82   Sodium 135 - 145 mmol/L 141  142  140   Potassium 3.5 - 5.1 mmol/L 3.6  4.2  3.9   Chloride 98 - 111 mmol/L 112  113  110   CO2 22 - 32 mmol/L 22  25  25    Calcium 8.9 - 10.3 mg/dL 8.6  9.2  9.2   Total Protein 6.5 - 8.1 g/dL 6.3  6.6  6.4   Total Bilirubin 0.3 - 1.2 mg/dL 0.3  0.4  0.3   Alkaline Phos 38 - 126 U/L 46  52  51   AST 15 - 41 U/L 17  20  17    ALT 0 - 44 U/L 25  26  22      RADIOGRAPHIC STUDIES: I have personally reviewed the radiological images as listed and agreed with the findings in the report. MM 3D SCREENING MAMMOGRAM BILATERAL  BREAST  Result Date: 01/20/2023 CLINICAL DATA:  Screening. EXAM: DIGITAL SCREENING BILATERAL MAMMOGRAM WITH TOMOSYNTHESIS AND CAD TECHNIQUE: Bilateral screening digital craniocaudal and mediolateral oblique mammograms were obtained. Bilateral screening digital breast tomosynthesis was performed. The images were evaluated with computer-aided detection. COMPARISON:  Previous exam(s). ACR Breast Density Category b: There are scattered areas of fibroglandular density. FINDINGS: There are no findings suspicious for malignancy. IMPRESSION: No mammographic evidence of malignancy. A result letter of this screening mammogram will be mailed directly to the patient. RECOMMENDATION: Screening mammogram in one year. (Code:SM-B-01Y) BI-RADS CATEGORY  1: Negative. Electronically Signed   By: Bary Richard M.D.   On: 01/20/2023 09:15    ASSESSMENT & PLAN:   77 y.o. female here for follow-up of her multiple myeloma and next treatment   #1 IgG kappa multiple myeloma.   Initially presented as smoldering myeloma and had M spike of 1.9 g/dL which was noted to be IgG kappa on immunofixation electrophoresis. -Bone survey shows no overt evidence of osseous involvement suggestive of multiple myeloma. Beta 2 microglobulin, sedimentation rate an LDH levels are within normal limits. -Bone marrow biopsy shows previously showed 17% monoclonal plasma cells Cytogenetics/MolCy - trisomy 21 (consistent with myeloma)   -Bone study from 09/27/17 showed some osteopenic changes without osteoporosis. Previous bone studies are not yet available  -09/08/2019 FISH Panel revealed "No Mutations Detected". -09/08/2019 Cytogenetics revealed "Normal female karyotype". -09/08/2019 BM Bx Report ([id number])  revealed "BONE MARROW, ASPIRATE, CLOT, CORE: -Hypercellular bone marrow for age with plasma cell neoplasm PERIPHERAL BLOOD: -Macrocytic anemia -Leukopenia." - 20% Plasma Cells  -09/15/2019 PET/CT ([id number]) revealed "1. No  findings of  active myeloma or other hypermetabolic malignancy. 2.  Aortic Atherosclerosis (ICD10-I70.0). 3. Photopenic hypodense hepatic lesions most compatible with cysts."   A treatment initiation active Multiple myeloma Bone Marrow Biopsy with nearly 60% plasma cells. With M spike of 3.6g/dl Starting to develop mild anemia (does not meet anemia criteria yet) No hypercalcemia, renal insufficiency  PET/CT 07/31/2020 - No abnormal hypermetabolism in the neck, chest, abdomen or pelvis.  -PET CT done 07/18/2021 - No hypermetabolic bone lesions suggestive of multiple myeloma.   #2  Chronic leukopenia with intermittent neutropenia. -B12 and folate levels within normal limits. -Reasonable to take a daily vitamin B complex tablet. -No indication for G-CSF at this time. -Now also additionally possibly due to Revlimid.   #3.  Mild anemia and leukopenia likely related to myeloma and Revlimid.  #4 rh/o influenza A infection in February 2023 -Patient's influenza A URI has resolved.  She was given Augmentin for secondary bacterial infection by her primary care physician.  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 CBC showed WBC of 2.7K, hemoglobin of 12.1, and platelets of 129K. -no anemia, mild leukopenia and thrombocytopenia likely form Revlimid, and not concerning -CMP normal -last myeloma panel from May 2024 showed M protein undetectable -today's myeloma panel pending -No notable toxicities from patient's maintenance Revlimid at this time  -continue maintenance Revlimid at current dose -Patient has no signs or symptoms suggestive of multiple myeloma progression at this time -Continue to monitor with labs and subsequent dose of Zometa in 3 months -discussed that if pain from carpal tunnel is uncontrolled despite use of splint and antiinflamatory medications, surgery may  be needed. Surgery to is not emergent at this time -advised pattient to regularly wear splint to address carpul tunnel by taking pressure off of the nerve  -recommend patient to take a general multivitamin -informed patient that in the case of having any new dental issues, Zometa would need to be held -will continue to monitor with labs in 3 months  FOLLOW UP: RTC with Dr Candise Che with labs and subsequent dose of zometa in 3 months   The total time spent in the appointment was 21 minutes* .  All of the patient's questions were answered with apparent satisfaction. The patient knows to call the clinic with any problems, questions or concerns.   Wyvonnia Lora MD MS AAHIVMS [facility] Hematology/Oncology Physician [facility]  .*Total Encounter Time as defined by the Centers for Medicare and Medicaid Services includes, in addition to the face-to-face time of a patient visit (documented in the note above) non-face-to-face time: obtaining and reviewing outside history, ordering and reviewing medications, tests or procedures, care coordination (communications with other health care professionals or caregivers) and documentation in the medical record.    I,Mitra Faeizi,acting as a Neurosurgeon for Wyvonnia Lora, MD.,have documented all relevant documentation on the behalf of Wyvonnia Lora, MD,as directed by  Wyvonnia Lora, MD while in the presence of Wyvonnia Lora, MD.  .I have reviewed the above documentation for accuracy and completeness, and I agree with the above. [name] MD

## 2023-02-11 ENCOUNTER — Encounter: Payer: Self-pay | Admitting: Hematology

## 2023-02-12 ENCOUNTER — Other Ambulatory Visit: Payer: Self-pay

## 2023-02-12 DIAGNOSIS — C9000 Multiple myeloma not having achieved remission: Secondary | ICD-10-CM

## 2023-02-12 MED ORDER — LENALIDOMIDE 10 MG PO CAPS: 10.0000 mg | ORAL_CAPSULE | Freq: Every day | ORAL | 0 refills | Status: DC

## 2023-02-18 ENCOUNTER — Telehealth: Payer: Self-pay | Admitting: Hematology

## 2023-02-18 NOTE — Telephone Encounter (Signed)
Patient is aware if scheduled appointment times/dates

## 2023-02-25 ENCOUNTER — Other Ambulatory Visit: Payer: Self-pay | Admitting: Hematology

## 2023-02-25 DIAGNOSIS — C9000 Multiple myeloma not having achieved remission: Secondary | ICD-10-CM

## 2023-03-10 ENCOUNTER — Other Ambulatory Visit: Payer: Self-pay

## 2023-03-10 DIAGNOSIS — C9000 Multiple myeloma not having achieved remission: Secondary | ICD-10-CM

## 2023-03-10 MED ORDER — POTASSIUM CHLORIDE CRYS ER 20 MEQ PO TBCR
EXTENDED_RELEASE_TABLET | ORAL | 0 refills | Status: DC
Start: 2023-03-10 — End: 2023-03-12

## 2023-03-11 ENCOUNTER — Other Ambulatory Visit: Payer: Self-pay

## 2023-03-11 DIAGNOSIS — C9000 Multiple myeloma not having achieved remission: Secondary | ICD-10-CM

## 2023-03-11 MED ORDER — LENALIDOMIDE 10 MG PO CAPS: 10.0000 mg | ORAL_CAPSULE | Freq: Every day | ORAL | 0 refills | Status: DC

## 2023-03-12 ENCOUNTER — Other Ambulatory Visit: Payer: Self-pay | Admitting: Hematology

## 2023-03-12 DIAGNOSIS — C9000 Multiple myeloma not having achieved remission: Secondary | ICD-10-CM

## 2023-03-18 DIAGNOSIS — J069 Acute upper respiratory infection, unspecified: Secondary | ICD-10-CM | POA: Diagnosis not present

## 2023-03-18 DIAGNOSIS — Z03818 Encounter for observation for suspected exposure to other biological agents ruled out: Secondary | ICD-10-CM | POA: Diagnosis not present

## 2023-03-18 DIAGNOSIS — R051 Acute cough: Secondary | ICD-10-CM | POA: Diagnosis not present

## 2023-03-30 ENCOUNTER — Other Ambulatory Visit: Payer: Self-pay | Admitting: Hematology

## 2023-03-30 DIAGNOSIS — C9000 Multiple myeloma not having achieved remission: Secondary | ICD-10-CM

## 2023-04-06 ENCOUNTER — Other Ambulatory Visit: Payer: Self-pay

## 2023-04-06 DIAGNOSIS — C9000 Multiple myeloma not having achieved remission: Secondary | ICD-10-CM

## 2023-04-06 MED ORDER — LENALIDOMIDE 10 MG PO CAPS: 10.0000 mg | ORAL_CAPSULE | Freq: Every day | ORAL | 0 refills | Status: DC

## 2023-04-14 ENCOUNTER — Other Ambulatory Visit: Payer: Self-pay | Admitting: Hematology

## 2023-04-14 DIAGNOSIS — C9000 Multiple myeloma not having achieved remission: Secondary | ICD-10-CM

## 2023-04-15 DIAGNOSIS — M1712 Unilateral primary osteoarthritis, left knee: Secondary | ICD-10-CM | POA: Diagnosis not present

## 2023-05-01 ENCOUNTER — Other Ambulatory Visit: Payer: Self-pay | Admitting: Hematology

## 2023-05-01 ENCOUNTER — Other Ambulatory Visit (HOSPITAL_BASED_OUTPATIENT_CLINIC_OR_DEPARTMENT_OTHER): Payer: Self-pay

## 2023-05-01 DIAGNOSIS — C9000 Multiple myeloma not having achieved remission: Secondary | ICD-10-CM

## 2023-05-02 ENCOUNTER — Encounter: Payer: Self-pay | Admitting: Hematology

## 2023-05-04 ENCOUNTER — Other Ambulatory Visit: Payer: Self-pay

## 2023-05-04 DIAGNOSIS — D472 Monoclonal gammopathy: Secondary | ICD-10-CM

## 2023-05-04 DIAGNOSIS — C9000 Multiple myeloma not having achieved remission: Secondary | ICD-10-CM

## 2023-05-04 MED ORDER — LENALIDOMIDE 10 MG PO CAPS: 10.0000 mg | ORAL_CAPSULE | Freq: Every day | ORAL | 0 refills | Status: DC

## 2023-05-05 ENCOUNTER — Inpatient Hospital Stay: Payer: Medicare HMO | Admitting: Hematology

## 2023-05-05 ENCOUNTER — Telehealth: Payer: Self-pay | Admitting: Pharmacy Technician

## 2023-05-05 ENCOUNTER — Inpatient Hospital Stay: Payer: Medicare HMO

## 2023-05-05 ENCOUNTER — Inpatient Hospital Stay: Payer: Medicare HMO | Attending: Hematology

## 2023-05-05 VITALS — BP 128/88 | HR 68 | Temp 98.0°F | Resp 18

## 2023-05-05 VITALS — BP 139/79 | HR 70 | Temp 97.5°F | Resp 20 | Wt 162.7 lb

## 2023-05-05 DIAGNOSIS — C9001 Multiple myeloma in remission: Secondary | ICD-10-CM

## 2023-05-05 DIAGNOSIS — C9000 Multiple myeloma not having achieved remission: Secondary | ICD-10-CM | POA: Insufficient documentation

## 2023-05-05 DIAGNOSIS — M8580 Other specified disorders of bone density and structure, unspecified site: Secondary | ICD-10-CM | POA: Diagnosis not present

## 2023-05-05 DIAGNOSIS — Z7961 Long term (current) use of immunomodulator: Secondary | ICD-10-CM | POA: Insufficient documentation

## 2023-05-05 DIAGNOSIS — Z7189 Other specified counseling: Secondary | ICD-10-CM

## 2023-05-05 DIAGNOSIS — Z79899 Other long term (current) drug therapy: Secondary | ICD-10-CM | POA: Diagnosis not present

## 2023-05-05 DIAGNOSIS — Z7982 Long term (current) use of aspirin: Secondary | ICD-10-CM | POA: Diagnosis not present

## 2023-05-05 DIAGNOSIS — Z79624 Long term (current) use of inhibitors of nucleotide synthesis: Secondary | ICD-10-CM | POA: Insufficient documentation

## 2023-05-05 DIAGNOSIS — M8589 Other specified disorders of bone density and structure, multiple sites: Secondary | ICD-10-CM

## 2023-05-05 DIAGNOSIS — D649 Anemia, unspecified: Secondary | ICD-10-CM | POA: Diagnosis not present

## 2023-05-05 DIAGNOSIS — D472 Monoclonal gammopathy: Secondary | ICD-10-CM

## 2023-05-05 LAB — CBC WITH DIFFERENTIAL (CANCER CENTER ONLY)
Abs Immature Granulocytes: 0.01 10*3/uL (ref 0.00–0.07)
Basophils Absolute: 0.1 10*3/uL (ref 0.0–0.1)
Basophils Relative: 2 %
Eosinophils Absolute: 0.2 10*3/uL (ref 0.0–0.5)
Eosinophils Relative: 7 %
HCT: 37.2 % (ref 36.0–46.0)
Hemoglobin: 12.7 g/dL (ref 12.0–15.0)
Immature Granulocytes: 0 %
Lymphocytes Relative: 45 %
Lymphs Abs: 1.4 10*3/uL (ref 0.7–4.0)
MCH: 33.2 pg (ref 26.0–34.0)
MCHC: 34.1 g/dL (ref 30.0–36.0)
MCV: 97.1 fL (ref 80.0–100.0)
Monocytes Absolute: 0.4 10*3/uL (ref 0.1–1.0)
Monocytes Relative: 13 %
Neutro Abs: 1 10*3/uL — ABNORMAL LOW (ref 1.7–7.7)
Neutrophils Relative %: 33 %
Platelet Count: 132 10*3/uL — ABNORMAL LOW (ref 150–400)
RBC: 3.83 MIL/uL — ABNORMAL LOW (ref 3.87–5.11)
RDW: 14.6 % (ref 11.5–15.5)
WBC Count: 3.2 10*3/uL — ABNORMAL LOW (ref 4.0–10.5)
nRBC: 0 % (ref 0.0–0.2)

## 2023-05-05 LAB — CMP (CANCER CENTER ONLY)
ALT: 28 U/L (ref 0–44)
AST: 21 U/L (ref 15–41)
Albumin: 4.2 g/dL (ref 3.5–5.0)
Alkaline Phosphatase: 48 U/L (ref 38–126)
Anion gap: 5 (ref 5–15)
BUN: 15 mg/dL (ref 8–23)
CO2: 25 mmol/L (ref 22–32)
Calcium: 9.1 mg/dL (ref 8.9–10.3)
Chloride: 109 mmol/L (ref 98–111)
Creatinine: 0.73 mg/dL (ref 0.44–1.00)
GFR, Estimated: >60 mL/min (ref >60)
Glucose, Bld: 106 mg/dL — ABNORMAL HIGH (ref 70–99)
Potassium: 3.8 mmol/L (ref 3.5–5.1)
Sodium: 139 mmol/L (ref 135–145)
Total Bilirubin: 0.5 mg/dL (ref <1.2)
Total Protein: 6.8 g/dL (ref 6.5–8.1)

## 2023-05-05 MED ORDER — SODIUM CHLORIDE 0.9 % IV SOLN: INTRAVENOUS | Status: DC

## 2023-05-05 MED ORDER — ZOLEDRONIC ACID 4 MG/100ML IV SOLN
4.0000 mg | Freq: Once | INTRAVENOUS | Status: AC
  Administered 2023-05-05: 4 mg via INTRAVENOUS
  Filled 2023-05-05: qty 100

## 2023-05-05 NOTE — Progress Notes (Signed)
HEMATOLOGY/ONCOLOGY CLINIC NOTE  DOS: 05/05/23   Patient Care Team: [name], MD as PCP - General (Family Medicine) [name], MD as Consulting Physician (Hematology)  CHIEF COMPLAINTS/PURPOSE OF VISIT:  Follow-up for continued evaluation management of multiple myeloma  HISTORY OF PRESENTING ILLNESS: Please see previous note  INTERVAL HISTORY  Tracy Fisher is a 77 y.o. female here for continued evaluation and management of her multiple myeloma on maintenance Revlimid.  Patient was last seen by me on 02/05/2023 and reported discoloration in her right foot, broken right big toe nail and right ring finger nail, back pain sometimes, carpal tunnel, and bilateral hand tingling.   Today, she reports that she has been doing well overall over the last 3 months.  She reports that she did receive the flu shot two months ago as well as another shot, but is unsure of which additional vaccine she received. Patinet reports having one planned vaccine remaining.   Patient denies any pain in the neck or along the back. She denies any abdominal pain, leg swelling, infection issues, new dental issues, or new bone pains. She reports normal p.o. intake.   Patient complains of back pain sometimes when bending over, requiring her to sit.   Patient complains of pain in her posterior legs. She reports receiving a shot in her knee which did improve pain. Patient reports dark discoloration in her right foot.   She reports neuropathy, which is controlled with gabapentin.   She sometimes endorses mild diarrhea from Revlimid, but has otherwise been tolerating Revlimid well with no severe toxicity issues.   She denies having any travel plans at this time.   Oncology History  Smoldering multiple myeloma (SMM)  Multiple myeloma (HCC)  08/28/2020 Initial Diagnosis   Multiple myeloma (HCC)   09/09/2020 - 10/07/2020 Chemotherapy         01/20/2021 - 07/07/2021 Chemotherapy   Patient is on  Treatment Plan : MYELOMA NEWLY DIAGNOSED TRANSPLANT CANDIDATE DaraVRd (Daratumumab IV) q21d x 6 Cycles (Induction/Consolidation)       MEDICAL HISTORY:  Past Medical History:  Diagnosis Date   Bone cancer (HCC) 2018   started chemo pill in  2018   Diabetes mellitus without complication (HCC)    Hypertension    Vocal cord cyst     SURGICAL HISTORY: Past Surgical History:  Procedure Laterality Date   NO PAST SURGERIES      SOCIAL HISTORY: Social History   Socioeconomic History   Marital status: Divorced    Spouse name: Not on file   Number of children: Not on file   Years of education: Not on file   Highest education level: Not on file  Occupational History   Not on file  Tobacco Use   Smoking status: Never   Smokeless tobacco: Never  Vaping Use   Vaping status: Never Used  Substance and Sexual Activity   Alcohol use: No   Drug use: Never   Sexual activity: Not on file  Other Topics Concern   Not on file  Social History Narrative   Not on file   Social Determinants of Health   Financial Resource Strain: Not on file  Food Insecurity: Not on file  Transportation Needs: Not on file  Physical Activity: Not on file  Stress: Not on file  Social Connections: Not on file  Intimate Partner Violence: Not on file    FAMILY HISTORY: Family History  Problem Relation Age of Onset   Breast cancer Neg Hx  Neuropathy Neg Hx     ALLERGIES:  is allergic to bortezomib and sulfa antibiotics.  MEDICATIONS:  Current Outpatient Medications  Medication Sig Dispense Refill   acyclovir (ZOVIRAX) 400 MG tablet Take 1 tablet (400 mg total) by mouth 2 (two) times daily. 60 tablet 3   albuterol (VENTOLIN HFA) 108 (90 Base) MCG/ACT inhaler Inhale 1-2 puffs into the lungs every 6 (six) hours as needed for wheezing or shortness of breath. 18 g 0   amLODipine (NORVASC) 5 MG tablet      aspirin EC 81 MG tablet Take 81 mg by mouth daily. Swallow whole.     atorvastatin (LIPITOR) 20  MG tablet      b complex vitamins capsule Take 1 capsule by mouth daily. 30 capsule 11   benzonatate (TESSALON) 100 MG capsule Take 1 capsule (100 mg total) by mouth every 8 (eight) hours. 21 capsule 0   brimonidine (ALPHAGAN) 0.2 % ophthalmic solution Place 1 drop into both eyes 2 (two) times daily.   3   Calcium Carbonate-Vitamin D (CALCIUM + D PO) Take 1 tablet by mouth daily.     dorzolamide-timolol (COSOPT) 22.3-6.8 MG/ML ophthalmic solution Place 1 drop into both eyes 2 (two) times daily.   12   DULoxetine (CYMBALTA) 60 MG capsule Take 1 capsule by mouth once daily 30 capsule 0   gabapentin (NEURONTIN) 300 MG capsule TAKE 1 CAPSULE BY MOUTH THREE TIMES DAILY 90 capsule 0   HYDROcodone-acetaminophen (NORCO) 5-325 MG tablet Take 1 tablet by mouth every 6 (six) hours as needed for moderate pain. 60 tablet 0   lenalidomide (REVLIMID) 10 MG capsule Take 1 capsule (10 mg total) by mouth daily. Take 21 days on, 7 days off, repeat every 28 days. 21 capsule 0   LUMIGAN 0.01 % SOLN INSTILL 1 DROP INTO EACH EYE ONCE DAILY     methylPREDNISolone (MEDROL DOSEPAK) 4 MG TBPK tablet Take as directed on the box 21 tablet 0   mirtazapine (REMERON) 15 MG tablet Take 15 mg by mouth at bedtime.     Multiple Vitamin (MULTIVITAMIN) capsule Take by mouth.     ondansetron (ZOFRAN) 4 MG tablet Take 1 tablet (4 mg total) by mouth every 6 (six) hours. 12 tablet 0   ONETOUCH VERIO test strip USE STRIP TO CHECK GLUCOSE TWICE DAILY     potassium chloride SA (KLOR-CON M) 20 MEQ tablet Take 1 tablet by mouth twice daily 60 tablet 0   No current facility-administered medications for this visit.    REVIEW OF SYSTEMS:    10 Point review of Systems was done is negative except as noted above.   PHYSICAL EXAMINATION: ECOG PERFORMANCE STATUS: 1 - Symptomatic but completely ambulatory  Vitals:   05/05/23 1030  BP: 139/79  Pulse: 70  Resp: 20  Temp: (!) 97.5 F (36.4 C)  SpO2: 98%   Filed Weights   05/05/23 1030   Weight: 162 lb 11.2 oz (73.8 kg)   GENERAL:alert, in no acute distress and comfortable SKIN: no acute rashes, no significant lesions EYES: conjunctiva are pink and non-injected, sclera anicteric OROPHARYNX: MMM, no exudates, no oropharyngeal erythema or ulceration NECK: supple, no JVD LYMPH:  no palpable lymphadenopathy in the cervical, axillary or inguinal regions LUNGS: clear to auscultation b/l with normal respiratory effort HEART: regular rate & rhythm ABDOMEN:  normoactive bowel sounds , non tender, not distended. Extremity: no pedal edema PSYCH: alert & oriented x 3 with fluent speech NEURO: no focal motor/sensory deficits  LABORATORY DATA:      Latest Ref Rng & Units 05/05/2023    9:45 AM 02/05/2023    9:17 AM 11/13/2022   12:51 PM  CBC  WBC 4.0 - 10.5 K/uL 3.2  2.7  2.5   Hemoglobin 12.0 - 15.0 g/dL 16.1  09.6  04.5   Hematocrit 36.0 - 46.0 % 37.2  35.9  36.1   Platelets 150 - 400 K/uL 132  129  159    .    Latest Ref Rng & Units 05/05/2023    9:45 AM 02/05/2023    9:17 AM 11/13/2022   12:51 PM  CMP  Glucose 70 - 99 mg/dL 409  811  86   BUN 8 - 23 mg/dL 15  13  14    Creatinine 0.44 - 1.00 mg/dL 9.14  7.82  9.56   Sodium 135 - 145 mmol/L 139  141  142   Potassium 3.5 - 5.1 mmol/L 3.8  3.6  4.2   Chloride 98 - 111 mmol/L 109  112  113   CO2 22 - 32 mmol/L 25  22  25    Calcium 8.9 - 10.3 mg/dL 9.1  8.6  9.2   Total Protein 6.5 - 8.1 g/dL 6.8  6.3  6.6   Total Bilirubin <1.2 mg/dL 0.5  0.3  0.4   Alkaline Phos 38 - 126 U/L 48  46  52   AST 15 - 41 U/L 21  17  20    ALT 0 - 44 U/L 28  25  26      RADIOGRAPHIC STUDIES: I have personally reviewed the radiological images as listed and agreed with the findings in the report. No results found.  ASSESSMENT & PLAN:   77 y.o. female here for follow-up of her multiple myeloma and next treatment   #1 IgG kappa multiple myeloma.   Initially presented as smoldering myeloma and had M spike of 1.9 g/dL which was noted to  be IgG kappa on immunofixation electrophoresis. -Bone survey shows no overt evidence of osseous involvement suggestive of multiple myeloma. Beta 2 microglobulin, sedimentation rate an LDH levels are within normal limits. -Bone marrow biopsy shows previously showed 17% monoclonal plasma cells Cytogenetics/MolCy - trisomy 37 (consistent with myeloma)   -Bone study from 09/27/17 showed some osteopenic changes without osteoporosis. Previous bone studies are not yet available  -09/08/2019 FISH Panel revealed "No Mutations Detected". -09/08/2019 Cytogenetics revealed "Normal female karyotype". -09/08/2019 BM Bx Report ([id number])  revealed "BONE MARROW, ASPIRATE, CLOT, CORE: -Hypercellular bone marrow for age with plasma cell neoplasm PERIPHERAL BLOOD: -Macrocytic anemia -Leukopenia." - 20% Plasma Cells  -09/15/2019 PET/CT ([id number]) revealed "1. No findings of active myeloma or other hypermetabolic malignancy. 2.  Aortic Atherosclerosis (ICD10-I70.0). 3. Photopenic hypodense hepatic lesions most compatible with cysts."   A treatment initiation active Multiple myeloma Bone Marrow Biopsy with nearly 60% plasma cells. With M spike of 3.6g/dl Starting to develop mild anemia (does not meet anemia criteria yet) No hypercalcemia, renal insufficiency  PET/CT 07/31/2020 - No abnormal hypermetabolism in the neck, chest, abdomen or pelvis.  -PET CT done 07/18/2021 - No hypermetabolic bone lesions suggestive of multiple myeloma.   #2  Chronic leukopenia with intermittent neutropenia. -B12 and folate levels within normal limits. -Reasonable to take a daily vitamin B complex tablet. -No indication for G-CSF at this time. -Now also additionally possibly due to Revlimid.   #3.  Mild anemia and leukopenia likely related to myeloma and Revlimid.  #4 rh/o influenza A infection in  February 2023 -Patient's influenza A URI has resolved.  She was given Augmentin for secondary bacterial infection by her primary  care physician.  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 CBC stable, showed WBC of 3.2K, hemoglobin of 12.7, and platelets of 132K. -mild neutropenia, mild thrombocytopenia -CMP stable -last myeloma lab from 3 months ago showed that her M protein remained undetectable -Patient has no signs or symptoms suggestive of multiple myeloma progression at this time -patient reports mild toxicity of diarrhea from Revlimid sometimes, but has otherwise been tolerating Revlimid well with no severe toxicity issues.  -continue maintenance Revlimid at current dose -proceed with Zometa 3 months -educated patient that pigment change in her foot may be from certain medication use or some element of venous congestion -answered all of patient's questions in detail  FOLLOW UP: RTC with Dr Candise Che with labs and subsequent dose of zometa in 3 months   The total time spent in the appointment was 23 minutes* .  All of the patient's questions were answered with apparent satisfaction. The patient knows to call the clinic with any problems, questions or concerns.   Wyvonnia Lora MD MS AAHIVMS [facility] Hematology/Oncology Physician [facility]  .*Total Encounter Time as defined by the Centers for Medicare and Medicaid Services includes, in addition to the face-to-face time of a patient visit (documented in the note above) non-face-to-face time: obtaining and reviewing outside history, ordering and reviewing medications, tests or procedures, care coordination (communications with other health care professionals or caregivers) and documentation in the medical record.    I,Mitra Faeizi,acting as a Neurosurgeon for Wyvonnia Lora, MD.,have documented all relevant documentation on the behalf of Wyvonnia Lora, MD,as directed by  Wyvonnia Lora, MD while in the presence of Wyvonnia Lora,  MD.  .I have reviewed the above documentation for accuracy and completeness, and I agree with the above. [name] MD

## 2023-05-05 NOTE — Patient Instructions (Signed)

## 2023-05-05 NOTE — Telephone Encounter (Signed)
Oral Oncology Patient Advocate Encounter  Was successful in securing patient a $12,000 grant from [facility] to provide copayment coverage for lenalidomide.  This will keep the out of pocket expense at $0.     [employer] ID: [id number]   The billing information is as follows and has been shared with [employer].    RxBin: [id number] PCN: [id number] Member ID: [id number] Group ID: [id number] Dates of Eligibility: 05/06/23 through 05/04/24  Fund:  MM  Jinger Neighbors, CPhT-Adv Oncology Pharmacy Patient Advocate [facility] Direct Number: [phone]  Fax: [phone]

## 2023-05-05 NOTE — Progress Notes (Signed)
Patient seen by Dr. Addison Naegeli are within treatment parameters.  Labs reviewed: and are within treatment parameters. Dr Candise Che aware Plts 132  Per physician team, patient is ready for treatment and there are NO modifications to the treatment plan.

## 2023-05-10 LAB — MULTIPLE MYELOMA PANEL, SERUM
Albumin SerPl Elph-Mcnc: 4.1 g/dL (ref 2.9–4.4)
Albumin/Glob SerPl: 1.7 (ref 0.7–1.7)
Alpha 1: 0.2 g/dL (ref 0.0–0.4)
Alpha2 Glob SerPl Elph-Mcnc: 0.7 g/dL (ref 0.4–1.0)
B-Globulin SerPl Elph-Mcnc: 0.8 g/dL (ref 0.7–1.3)
Gamma Glob SerPl Elph-Mcnc: 0.8 g/dL (ref 0.4–1.8)
Globulin, Total: 2.5 g/dL (ref 2.2–3.9)
IgA: 83 mg/dL (ref 64–422)
IgG (Immunoglobin G), Serum: 921 mg/dL (ref 586–1602)
IgM (Immunoglobulin M), Srm: 52 mg/dL (ref 26–217)
Total Protein ELP: 6.6 g/dL (ref 6.0–8.5)

## 2023-05-11 ENCOUNTER — Encounter: Payer: Self-pay | Admitting: Hematology

## 2023-05-11 DIAGNOSIS — H401123 Primary open-angle glaucoma, left eye, severe stage: Secondary | ICD-10-CM | POA: Diagnosis not present

## 2023-05-11 DIAGNOSIS — Z961 Presence of intraocular lens: Secondary | ICD-10-CM | POA: Diagnosis not present

## 2023-05-13 ENCOUNTER — Other Ambulatory Visit: Payer: Self-pay | Admitting: Hematology

## 2023-05-13 DIAGNOSIS — C9000 Multiple myeloma not having achieved remission: Secondary | ICD-10-CM

## 2023-05-17 DIAGNOSIS — M62838 Other muscle spasm: Secondary | ICD-10-CM | POA: Diagnosis not present

## 2023-05-17 DIAGNOSIS — S0003XA Contusion of scalp, initial encounter: Secondary | ICD-10-CM | POA: Diagnosis not present

## 2023-05-17 DIAGNOSIS — S0990XA Unspecified injury of head, initial encounter: Secondary | ICD-10-CM | POA: Diagnosis not present

## 2023-05-20 ENCOUNTER — Encounter: Payer: Self-pay | Admitting: Hematology

## 2023-05-20 NOTE — Telephone Encounter (Signed)
Telephone call  

## 2023-05-31 ENCOUNTER — Other Ambulatory Visit: Payer: Self-pay

## 2023-05-31 DIAGNOSIS — C9000 Multiple myeloma not having achieved remission: Secondary | ICD-10-CM

## 2023-05-31 MED ORDER — LENALIDOMIDE 10 MG PO CAPS: 10.0000 mg | ORAL_CAPSULE | Freq: Every day | ORAL | 0 refills | Status: DC

## 2023-06-04 ENCOUNTER — Other Ambulatory Visit: Payer: Self-pay | Admitting: Hematology

## 2023-06-04 DIAGNOSIS — C9000 Multiple myeloma not having achieved remission: Secondary | ICD-10-CM

## 2023-06-07 ENCOUNTER — Other Ambulatory Visit: Payer: Self-pay | Admitting: Hematology

## 2023-06-07 DIAGNOSIS — C9000 Multiple myeloma not having achieved remission: Secondary | ICD-10-CM

## 2023-06-08 DIAGNOSIS — M7918 Myalgia, other site: Secondary | ICD-10-CM | POA: Diagnosis not present

## 2023-06-08 DIAGNOSIS — M7062 Trochanteric bursitis, left hip: Secondary | ICD-10-CM | POA: Diagnosis not present

## 2023-06-08 DIAGNOSIS — M545 Low back pain, unspecified: Secondary | ICD-10-CM | POA: Diagnosis not present

## 2023-06-14 ENCOUNTER — Other Ambulatory Visit: Payer: Self-pay | Admitting: Hematology

## 2023-06-14 DIAGNOSIS — C9000 Multiple myeloma not having achieved remission: Secondary | ICD-10-CM

## 2023-06-29 ENCOUNTER — Other Ambulatory Visit: Payer: Self-pay

## 2023-06-29 DIAGNOSIS — C9000 Multiple myeloma not having achieved remission: Secondary | ICD-10-CM

## 2023-06-29 MED ORDER — LENALIDOMIDE 10 MG PO CAPS: 10.0000 mg | ORAL_CAPSULE | Freq: Every day | ORAL | 0 refills | Status: DC

## 2023-07-01 DIAGNOSIS — M545 Low back pain, unspecified: Secondary | ICD-10-CM | POA: Diagnosis not present

## 2023-07-07 ENCOUNTER — Other Ambulatory Visit (HOSPITAL_COMMUNITY): Payer: Self-pay

## 2023-07-07 ENCOUNTER — Other Ambulatory Visit: Payer: Self-pay

## 2023-07-07 DIAGNOSIS — C9000 Multiple myeloma not having achieved remission: Secondary | ICD-10-CM

## 2023-07-07 MED ORDER — LENALIDOMIDE 10 MG PO CAPS: 10.0000 mg | ORAL_CAPSULE | Freq: Every day | ORAL | 0 refills | Status: DC

## 2023-07-08 ENCOUNTER — Other Ambulatory Visit: Payer: Self-pay | Admitting: Hematology

## 2023-07-08 DIAGNOSIS — C9000 Multiple myeloma not having achieved remission: Secondary | ICD-10-CM

## 2023-07-15 ENCOUNTER — Other Ambulatory Visit: Payer: Self-pay | Admitting: Hematology

## 2023-07-15 DIAGNOSIS — J309 Allergic rhinitis, unspecified: Secondary | ICD-10-CM | POA: Diagnosis not present

## 2023-07-15 DIAGNOSIS — I10 Essential (primary) hypertension: Secondary | ICD-10-CM | POA: Diagnosis not present

## 2023-07-15 DIAGNOSIS — C9001 Multiple myeloma in remission: Secondary | ICD-10-CM | POA: Diagnosis not present

## 2023-07-15 DIAGNOSIS — C9000 Multiple myeloma not having achieved remission: Secondary | ICD-10-CM

## 2023-07-15 DIAGNOSIS — D849 Immunodeficiency, unspecified: Secondary | ICD-10-CM | POA: Diagnosis not present

## 2023-07-15 DIAGNOSIS — F419 Anxiety disorder, unspecified: Secondary | ICD-10-CM | POA: Diagnosis not present

## 2023-07-15 DIAGNOSIS — F325 Major depressive disorder, single episode, in full remission: Secondary | ICD-10-CM | POA: Diagnosis not present

## 2023-07-15 DIAGNOSIS — E119 Type 2 diabetes mellitus without complications: Secondary | ICD-10-CM | POA: Diagnosis not present

## 2023-07-15 DIAGNOSIS — E785 Hyperlipidemia, unspecified: Secondary | ICD-10-CM | POA: Diagnosis not present

## 2023-07-15 DIAGNOSIS — D709 Neutropenia, unspecified: Secondary | ICD-10-CM | POA: Diagnosis not present

## 2023-07-15 DIAGNOSIS — Z0000 Encounter for general adult medical examination without abnormal findings: Secondary | ICD-10-CM | POA: Diagnosis not present

## 2023-07-15 DIAGNOSIS — G620 Drug-induced polyneuropathy: Secondary | ICD-10-CM | POA: Diagnosis not present

## 2023-07-15 DIAGNOSIS — M8588 Other specified disorders of bone density and structure, other site: Secondary | ICD-10-CM | POA: Diagnosis not present

## 2023-07-16 ENCOUNTER — Encounter: Payer: Self-pay | Admitting: Hematology

## 2023-07-19 ENCOUNTER — Ambulatory Visit: Payer: Medicare HMO | Admitting: Podiatry

## 2023-07-19 ENCOUNTER — Encounter: Payer: Self-pay | Admitting: Podiatry

## 2023-07-19 VITALS — Ht 64.0 in

## 2023-07-19 DIAGNOSIS — M79674 Pain in right toe(s): Secondary | ICD-10-CM

## 2023-07-19 DIAGNOSIS — M79675 Pain in left toe(s): Secondary | ICD-10-CM | POA: Diagnosis not present

## 2023-07-19 DIAGNOSIS — E1142 Type 2 diabetes mellitus with diabetic polyneuropathy: Secondary | ICD-10-CM | POA: Diagnosis not present

## 2023-07-19 DIAGNOSIS — B351 Tinea unguium: Secondary | ICD-10-CM | POA: Diagnosis not present

## 2023-07-19 NOTE — Progress Notes (Unsigned)
       Subjective:  Patient ID: Tracy Fisher, female    DOB: Dec 25, 1945,  MRN: [id number]  Tracy Fisher presents to clinic today for:  Chief Complaint  Patient presents with   [facility]    She is here for [facility], she does not know her last A1C level, She reports she also neuropathy in her feet,    Patient notes nails are thick, discolored, elongated and painful in shoegear when trying to ambulate.  Patient notes that she does have burning and numbness in her feet and toes.  She has some concerns about some discoloration "streaks" within the toenails mostly located along the side margins of the nails.  She has no pain associated with this.  PCP is [name], MD.  Past Medical History:  Diagnosis Date   Bone cancer (HCC) 2018   started chemo pill in  2018   Diabetes mellitus without complication (HCC)    Hypertension    Vocal cord cyst     Past Surgical History:  Procedure Laterality Date   NO PAST SURGERIES      Allergies  Allergen Reactions   Bortezomib Other (See Comments)    paresthesias to bilateral hands and feet.  Soreness/neuropathy   Sulfa Antibiotics Itching    Review of Systems: Negative except as noted in the HPI.  Objective:  [name] is a pleasant 78 y.o. female in NAD. AAO x 3.  Vascular Examination: Capillary refill time is 3-5 seconds to toes bilateral. Palpable pedal pulses b/l LE. Digital hair present b/l.  Skin temperature gradient WNL b/l. No varicosities b/l. No cyanosis noted b/l.   Dermatological Examination: Pedal skin with normal turgor, texture and tone b/l. No open wounds. No interdigital macerations b/l. Toenails x10 are 3mm thick, discolored, dystrophic with subungual debris. There is pain with compression of the nail plates.  They are elongated x10.  There are longitudinal melanin streaks noted in almost all 10 toenails.  These are not central.  They are mostly located along the medial and lateral nail margins.  The melanin  pigment does not extend onto the skin beyond the nail plate.  Neurological Examination: Protective sensation intact bilateral LE. Vibratory sensation intact bilateral LE.  Decreased light touch sensation to the toes bilateral.  Assessment/Plan: 1. Pain due to onychomycosis of toenails of both feet   2. Type 2 diabetes mellitus with peripheral neuropathy (HCC)    The mycotic toenails were sharply debrided x10 with sterile nail nippers and a power debriding burr to decrease bulk/thickness and length.    Discussed good habits for the patient to get into regarding having some neuropathy in the feet secondary to the diabetes.  She was given patient education today.  Return in about 3 months (around 10/17/2023) for [facility].   Clerance Lav, DPM, FACFAS [facility]     [address] [place], [place] [address]                Office [phone]  Fax [phone]

## 2023-07-21 ENCOUNTER — Other Ambulatory Visit: Payer: Self-pay | Admitting: Family Medicine

## 2023-07-21 DIAGNOSIS — M8580 Other specified disorders of bone density and structure, unspecified site: Secondary | ICD-10-CM

## 2023-07-26 ENCOUNTER — Other Ambulatory Visit: Payer: Self-pay | Admitting: Family Medicine

## 2023-07-26 DIAGNOSIS — Z0000 Encounter for general adult medical examination without abnormal findings: Secondary | ICD-10-CM

## 2023-07-30 ENCOUNTER — Other Ambulatory Visit: Payer: Self-pay

## 2023-07-30 DIAGNOSIS — D472 Monoclonal gammopathy: Secondary | ICD-10-CM

## 2023-08-02 ENCOUNTER — Inpatient Hospital Stay: Payer: Medicare HMO | Attending: Hematology

## 2023-08-02 ENCOUNTER — Inpatient Hospital Stay: Payer: Medicare HMO

## 2023-08-02 ENCOUNTER — Inpatient Hospital Stay: Payer: Medicare HMO | Admitting: Hematology

## 2023-08-02 VITALS — BP 140/92 | HR 79 | Temp 97.6°F | Resp 16 | Ht 64.0 in | Wt 164.8 lb

## 2023-08-02 VITALS — BP 159/86 | HR 76 | Temp 97.7°F | Resp 18

## 2023-08-02 DIAGNOSIS — C9001 Multiple myeloma in remission: Secondary | ICD-10-CM | POA: Diagnosis not present

## 2023-08-02 DIAGNOSIS — D472 Monoclonal gammopathy: Secondary | ICD-10-CM

## 2023-08-02 DIAGNOSIS — C9000 Multiple myeloma not having achieved remission: Secondary | ICD-10-CM | POA: Diagnosis not present

## 2023-08-02 DIAGNOSIS — Z7961 Long term (current) use of immunomodulator: Secondary | ICD-10-CM | POA: Diagnosis not present

## 2023-08-02 DIAGNOSIS — Z7982 Long term (current) use of aspirin: Secondary | ICD-10-CM | POA: Diagnosis not present

## 2023-08-02 DIAGNOSIS — Z79624 Long term (current) use of inhibitors of nucleotide synthesis: Secondary | ICD-10-CM | POA: Diagnosis not present

## 2023-08-02 DIAGNOSIS — Z79899 Other long term (current) drug therapy: Secondary | ICD-10-CM | POA: Insufficient documentation

## 2023-08-02 DIAGNOSIS — M8589 Other specified disorders of bone density and structure, multiple sites: Secondary | ICD-10-CM

## 2023-08-02 DIAGNOSIS — Z7189 Other specified counseling: Secondary | ICD-10-CM

## 2023-08-02 LAB — CMP (CANCER CENTER ONLY)
ALT: 22 U/L (ref 0–44)
AST: 18 U/L (ref 15–41)
Albumin: 4.2 g/dL (ref 3.5–5.0)
Alkaline Phosphatase: 46 U/L (ref 38–126)
Anion gap: 6 (ref 5–15)
BUN: 11 mg/dL (ref 8–23)
CO2: 24 mmol/L (ref 22–32)
Calcium: 9.6 mg/dL (ref 8.9–10.3)
Chloride: 109 mmol/L (ref 98–111)
Creatinine: 0.67 mg/dL (ref 0.44–1.00)
GFR, Estimated: >60 mL/min (ref >60)
Glucose, Bld: 132 mg/dL — ABNORMAL HIGH (ref 70–99)
Potassium: 3.6 mmol/L (ref 3.5–5.1)
Sodium: 139 mmol/L (ref 135–145)
Total Bilirubin: 0.4 mg/dL (ref 0.0–1.2)
Total Protein: 6.6 g/dL (ref 6.5–8.1)

## 2023-08-02 LAB — CBC WITH DIFFERENTIAL (CANCER CENTER ONLY)
Abs Immature Granulocytes: 0 10*3/uL (ref 0.00–0.07)
Basophils Absolute: 0 10*3/uL (ref 0.0–0.1)
Basophils Relative: 1 %
Eosinophils Absolute: 0.1 10*3/uL (ref 0.0–0.5)
Eosinophils Relative: 3 %
HCT: 36.8 % (ref 36.0–46.0)
Hemoglobin: 12.4 g/dL (ref 12.0–15.0)
Immature Granulocytes: 0 %
Lymphocytes Relative: 37 %
Lymphs Abs: 1.1 10*3/uL (ref 0.7–4.0)
MCH: 33.9 pg (ref 26.0–34.0)
MCHC: 33.7 g/dL (ref 30.0–36.0)
MCV: 100.5 fL — ABNORMAL HIGH (ref 80.0–100.0)
Monocytes Absolute: 0.4 10*3/uL (ref 0.1–1.0)
Monocytes Relative: 12 %
Neutro Abs: 1.4 10*3/uL — ABNORMAL LOW (ref 1.7–7.7)
Neutrophils Relative %: 47 %
Platelet Count: 164 10*3/uL (ref 150–400)
RBC: 3.66 MIL/uL — ABNORMAL LOW (ref 3.87–5.11)
RDW: 14.8 % (ref 11.5–15.5)
WBC Count: 2.9 10*3/uL — ABNORMAL LOW (ref 4.0–10.5)
nRBC: 0 % (ref 0.0–0.2)

## 2023-08-02 MED ORDER — ZOLEDRONIC ACID 4 MG/100ML IV SOLN
4.0000 mg | Freq: Once | INTRAVENOUS | Status: AC
  Administered 2023-08-02: 4 mg via INTRAVENOUS
  Filled 2023-08-02: qty 100

## 2023-08-02 NOTE — Progress Notes (Signed)
Patient seen by Dr. Addison Naegeli are within treatment parameters.Dr Candise Che aware BP 140/92  Labs reviewed: and are not all within treatment parameters.   Dr Candise Che aware : ANC: 1.4,   Per physician team, patient is ready for treatment and there are NO modifications to the treatment plan.

## 2023-08-02 NOTE — Progress Notes (Signed)
Pt tolerated Zometa well. Pt ambulated out of facility stable. Advised to f/u as scheduled

## 2023-08-02 NOTE — Progress Notes (Incomplete)
HEMATOLOGY/ONCOLOGY CLINIC NOTE  DOS: 08/02/23   Patient Care Team: [name], MD as PCP - General (Family Medicine) [name], MD as Consulting Physician (Hematology)  CHIEF COMPLAINTS/PURPOSE OF VISIT:  Follow-up for continued evaluation management of multiple myeloma  HISTORY OF PRESENTING ILLNESS: Please see previous note  INTERVAL HISTORY  Tracy Fisher is a 78 y.o. female here for continued evaluation and management of her multiple myeloma on maintenance Revlimid.  Patient was last seen by me on 05/05/2023 and she complained of back pain, bilateral leg pain, neuropathy, and mild diarrhea from Revlimid.  She reports neuropathy, which is controlled with gabapentin.   Patient notes she has been doing well overall since our last visit. She complains of intermittent upper back pain, which occasionally improves after taking tylenol.   She denies any new infection issues, fever, chills, night sweats, unexpected weight loss, chest pain, abdominal pain, or leg swelling. She does endorse post-nasal congestion due to allergies.   She is compliant with her medication. She has started taking Vitamin D-3 supplement.   Patient notes she has been tolerating her current dose of Revlimid with mild toxicity of diarrhea. She occasionally takes Imodium, which improves her diarrhea.   Patient notes she has been eating well and has been staying well-hydrated.   She notes that her neuropathy is stable with her current dose of Gabapentin and Cymbalta.    Oncology History  Smoldering multiple myeloma (SMM)  Multiple myeloma (HCC)  08/28/2020 Initial Diagnosis   Multiple myeloma (HCC)   09/09/2020 - 10/07/2020 Chemotherapy         01/20/2021 - 07/07/2021 Chemotherapy   Patient is on Treatment Plan : MYELOMA NEWLY DIAGNOSED TRANSPLANT CANDIDATE DaraVRd (Daratumumab IV) q21d x 6 Cycles (Induction/Consolidation)       MEDICAL HISTORY:  Past Medical History:  Diagnosis Date    Bone cancer (HCC) 2018   started chemo pill in  2018   Diabetes mellitus without complication (HCC)    Hypertension    Vocal cord cyst     SURGICAL HISTORY: Past Surgical History:  Procedure Laterality Date   NO PAST SURGERIES      SOCIAL HISTORY: Social History   Socioeconomic History   Marital status: Divorced    Spouse name: Not on file   Number of children: Not on file   Years of education: Not on file   Highest education level: Not on file  Occupational History   Not on file  Tobacco Use   Smoking status: Never   Smokeless tobacco: Never  Vaping Use   Vaping status: Never Used  Substance and Sexual Activity   Alcohol use: No   Drug use: Never   Sexual activity: Not on file  Other Topics Concern   Not on file  Social History Narrative   Not on file   Social Drivers of Health   Financial Resource Strain: Not on file  Food Insecurity: Not on file  Transportation Needs: Not on file  Physical Activity: Not on file  Stress: Not on file  Social Connections: Not on file  Intimate Partner Violence: Not on file    FAMILY HISTORY: Family History  Problem Relation Age of Onset   Breast cancer Neg Hx    Neuropathy Neg Hx     ALLERGIES:  is allergic to bortezomib and sulfa antibiotics.  MEDICATIONS:  Current Outpatient Medications  Medication Sig Dispense Refill   acyclovir (ZOVIRAX) 400 MG tablet Take 1 tablet (400 mg total) by mouth 2 (  two) times daily. 60 tablet 3   albuterol (VENTOLIN HFA) 108 (90 Base) MCG/ACT inhaler Inhale 1-2 puffs into the lungs every 6 (six) hours as needed for wheezing or shortness of breath. 18 g 0   amLODipine (NORVASC) 5 MG tablet      aspirin EC 81 MG tablet Take 81 mg by mouth daily. Swallow whole.     atorvastatin (LIPITOR) 20 MG tablet      b complex vitamins capsule Take 1 capsule by mouth daily. 30 capsule 11   benzonatate (TESSALON) 100 MG capsule Take 1 capsule (100 mg total) by mouth every 8 (eight) hours. 21 capsule 0    brimonidine (ALPHAGAN) 0.2 % ophthalmic solution Place 1 drop into both eyes 2 (two) times daily.   3   Calcium Carbonate-Vitamin D (CALCIUM + D PO) Take 1 tablet by mouth daily.     dorzolamide-timolol (COSOPT) 22.3-6.8 MG/ML ophthalmic solution Place 1 drop into both eyes 2 (two) times daily.   12   DULoxetine (CYMBALTA) 60 MG capsule Take 1 capsule by mouth once daily 30 capsule 0   gabapentin (NEURONTIN) 300 MG capsule TAKE 1 CAPSULE BY MOUTH THREE TIMES DAILY 90 capsule 0   HYDROcodone-acetaminophen (NORCO) 5-325 MG tablet Take 1 tablet by mouth every 6 (six) hours as needed for moderate pain. 60 tablet 0   lenalidomide (REVLIMID) 10 MG capsule Take 1 capsule (10 mg total) by mouth daily. Take 21 days on, 7 days off, repeat every 28 days. 21 capsule 0   LUMIGAN 0.01 % SOLN INSTILL 1 DROP INTO EACH EYE ONCE DAILY     methylPREDNISolone (MEDROL DOSEPAK) 4 MG TBPK tablet Take as directed on the box 21 tablet 0   mirtazapine (REMERON) 15 MG tablet Take 15 mg by mouth at bedtime.     Multiple Vitamin (MULTIVITAMIN) capsule Take by mouth.     ondansetron (ZOFRAN) 4 MG tablet Take 1 tablet (4 mg total) by mouth every 6 (six) hours. 12 tablet 0   ONETOUCH VERIO test strip USE STRIP TO CHECK GLUCOSE TWICE DAILY     Potassium Chloride ER 20 MEQ TBCR Take 1 tablet by mouth twice daily 60 tablet 0   potassium chloride SA (KLOR-CON M) 20 MEQ tablet Take 1 tablet by mouth twice daily 60 tablet 0   No current facility-administered medications for this visit.    REVIEW OF SYSTEMS:    10 Point review of Systems was done is negative except as noted above.   PHYSICAL EXAMINATION: ECOG PERFORMANCE STATUS: 1 - Symptomatic but completely ambulatory  There were no vitals filed for this visit.  There were no vitals filed for this visit.  GENERAL:alert, in no acute distress and comfortable SKIN: no acute rashes, no significant lesions EYES: conjunctiva are pink and non-injected, sclera  anicteric OROPHARYNX: MMM, no exudates, no oropharyngeal erythema or ulceration NECK: supple, no JVD LYMPH:  no palpable lymphadenopathy in the cervical, axillary or inguinal regions LUNGS: clear to auscultation b/l with normal respiratory effort HEART: regular rate & rhythm ABDOMEN:  normoactive bowel sounds , non tender, not distended. Extremity: no pedal edema PSYCH: alert & oriented x 3 with fluent speech NEURO: no focal motor/sensory deficits    LABORATORY DATA:      Latest Ref Rng & Units 05/05/2023    9:45 AM 02/05/2023    9:17 AM 11/13/2022   12:51 PM  CBC  WBC 4.0 - 10.5 K/uL 3.2  2.7  2.5   Hemoglobin 12.0 - 15.0  g/dL 16.1  09.6  04.5   Hematocrit 36.0 - 46.0 % 37.2  35.9  36.1   Platelets 150 - 400 K/uL 132  129  159    .    Latest Ref Rng & Units 05/05/2023    9:45 AM 02/05/2023    9:17 AM 11/13/2022   12:51 PM  CMP  Glucose 70 - 99 mg/dL 409  811  86   BUN 8 - 23 mg/dL 15  13  14    Creatinine 0.44 - 1.00 mg/dL 9.14  7.82  9.56   Sodium 135 - 145 mmol/L 139  141  142   Potassium 3.5 - 5.1 mmol/L 3.8  3.6  4.2   Chloride 98 - 111 mmol/L 109  112  113   CO2 22 - 32 mmol/L 25  22  25    Calcium 8.9 - 10.3 mg/dL 9.1  8.6  9.2   Total Protein 6.5 - 8.1 g/dL 6.8  6.3  6.6   Total Bilirubin <1.2 mg/dL 0.5  0.3  0.4   Alkaline Phos 38 - 126 U/L 48  46  52   AST 15 - 41 U/L 21  17  20    ALT 0 - 44 U/L 28  25  26      RADIOGRAPHIC STUDIES: I have personally reviewed the radiological images as listed and agreed with the findings in the report. No results found.  ASSESSMENT & PLAN:   78 y.o. female here for follow-up of her multiple myeloma and next treatment   #1 IgG kappa multiple myeloma.   Initially presented as smoldering myeloma and had M spike of 1.9 g/dL which was noted to be IgG kappa on immunofixation electrophoresis. -Bone survey shows no overt evidence of osseous involvement suggestive of multiple myeloma. Beta 2 microglobulin, sedimentation rate an LDH  levels are within normal limits. -Bone marrow biopsy shows previously showed 17% monoclonal plasma cells Cytogenetics/MolCy - trisomy 57 (consistent with myeloma)   -Bone study from 09/27/17 showed some osteopenic changes without osteoporosis. Previous bone studies are not yet available  -09/08/2019 FISH Panel revealed "No Mutations Detected". -09/08/2019 Cytogenetics revealed "Normal female karyotype". -09/08/2019 BM Bx Report ([id number])  revealed "BONE MARROW, ASPIRATE, CLOT, CORE: -Hypercellular bone marrow for age with plasma cell neoplasm PERIPHERAL BLOOD: -Macrocytic anemia -Leukopenia." - 20% Plasma Cells  -09/15/2019 PET/CT ([id number]) revealed "1. No findings of active myeloma or other hypermetabolic malignancy. 2.  Aortic Atherosclerosis (ICD10-I70.0). 3. Photopenic hypodense hepatic lesions most compatible with cysts."   A treatment initiation active Multiple myeloma Bone Marrow Biopsy with nearly 60% plasma cells. With M spike of 3.6g/dl Starting to develop mild anemia (does not meet anemia criteria yet) No hypercalcemia, renal insufficiency  PET/CT 07/31/2020 - No abnormal hypermetabolism in the neck, chest, abdomen or pelvis.  -PET CT done 07/18/2021 - No hypermetabolic bone lesions suggestive of multiple myeloma.   #2  Chronic leukopenia with intermittent neutropenia. -B12 and folate levels within normal limits. -Reasonable to take a daily vitamin B complex tablet. -No indication for G-CSF at this time. -Now also additionally possibly due to Revlimid.   #3.  Mild anemia and leukopenia likely related to myeloma and Revlimid.  #4 rh/o influenza A infection in February 2023 -Patient's influenza A URI has resolved.  She was given Augmentin for secondary bacterial infection by her primary care physician.  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 CBC shows low WBC of 2.9 K. CMP stable.  -Patient has been tolerating her current dose of Revlimid with mild toxicity of diarrhea, which improves with Imodium.  -Patient can continue with current dose of Revlimid.  -Recommend to take Imodium as needed for diarrhea.  -Answered all of patient's questions.  -Patient has no signs or symptoms suggestive of multiple myeloma progression at this time -proceed with Zometa 3 months -Recommend to try warm compression for back pain. Recommend to call us if back pain persists. We can get an MRI.   FOLLOW UP: ***  The total time spent in the appointment was *** minutes* .  All of the patient's questions were answered with apparent satisfaction. The patient knows to call the clinic with any problems, questions or concerns.   Wyvonnia Lora MD MS AAHIVMS [facility] Hematology/Oncology Physician [facility]  .*Total Encounter Time as defined by the Centers for Medicare and Medicaid Services includes, in addition to the face-to-face time of a patient visit (documented in the note above) non-face-to-face time: obtaining and reviewing outside history, ordering and reviewing medications, tests or procedures, care coordination (communications with other health care professionals or caregivers) and documentation in the medical record.   I,Param Shah,acting as a Neurosurgeon for Wyvonnia Lora, MD.,have documented all relevant documentation on the behalf of Wyvonnia Lora, MD,as directed by  Wyvonnia Lora, MD while in the presence of Wyvonnia Lora, MD.

## 2023-08-02 NOTE — Patient Instructions (Signed)
 Zoledronic Acid Injection  What is this medication? ZOLEDRONIC ACID (ZOE le dron ik AS id) treats high calcium levels in the blood caused by cancer. It may also be used with chemotherapy to treat weakened bones caused by cancer. It works by slowing down the release of calcium from bones. This lowers calcium levels in your blood. It also makes your bones stronger and less likely to break (fracture). It belongs to a group of medications called bisphosphonates. This medicine may be used for other purposes; ask your health care provider or pharmacist if you have questions. COMMON BRAND NAME(S): Zometa, Zometa Powder What should I tell my care team before I take this medication? They need to know if you have any of these conditions: Dehydration Dental disease Kidney disease Liver disease Low levels of calcium in the blood Lung or breathing disease, such as asthma Receiving steroids, such as dexamethasone or prednisone An unusual or allergic reaction to zoledronic acid, other medications, foods, dyes, or preservatives Pregnant or trying to get pregnant Breast-feeding How should I use this medication? This medication is injected into a vein. It is given by your care team in a hospital or clinic setting. Talk to your care team about the use of this medication in children. Special care may be needed. Overdosage: If you think you have taken too much of this medicine contact a poison control center or emergency room at once. NOTE: This medicine is only for you. Do not share this medicine with others. What if I miss a dose? Keep appointments for follow-up doses. It is important not to miss your dose. Call your care team if you are unable to keep an appointment. What may interact with this medication? Certain antibiotics given by injection Diuretics, such as bumetanide, furosemide NSAIDs, medications for pain and inflammation, such as ibuprofen or naproxen Teriparatide Thalidomide This list may not  describe all possible interactions. Give your health care provider a list of all the medicines, herbs, non-prescription drugs, or dietary supplements you use. Also tell them if you smoke, drink alcohol, or use illegal drugs. Some items may interact with your medicine. What should I watch for while using this medication? Visit your care team for regular checks on your progress. It may be some time before you see the benefit from this medication. Some people who take this medication have severe bone, joint, or muscle pain. This medication may also increase your risk for jaw problems or a broken thigh bone. Tell your care team right away if you have severe pain in your jaw, bones, joints, or muscles. Tell you care team if you have any pain that does not go away or that gets worse. Tell your dentist and dental surgeon that you are taking this medication. You should not have major dental surgery while on this medication. See your dentist to have a dental exam and fix any dental problems before starting this medication. Take good care of your teeth while on this medication. Make sure you see your dentist for regular follow-up appointments. You should make sure you get enough calcium and vitamin D while you are taking this medication. Discuss the foods you eat and the vitamins you take with your care team. Check with your care team if you have severe diarrhea, nausea, and vomiting, or if you sweat a lot. The loss of too much body fluid may make it dangerous for you to take this medication. You may need bloodwork while taking this medication. Talk to your care team if  you wish to become pregnant or think you might be pregnant. This medication can cause serious birth defects. What side effects may I notice from receiving this medication? Side effects that you should report to your care team as soon as possible: Allergic reactions--skin rash, itching, hives, swelling of the face, lips, tongue, or throat Kidney  injury--decrease in the amount of urine, swelling of the ankles, hands, or feet Low calcium level--muscle pain or cramps, confusion, tingling, or numbness in the hands or feet Osteonecrosis of the jaw--pain, swelling, or redness in the mouth, numbness of the jaw, poor healing after dental work, unusual discharge from the mouth, visible bones in the mouth Severe bone, joint, or muscle pain Side effects that usually do not require medical attention (report to your care team if they continue or are bothersome): Constipation Fatigue Fever Loss of appetite Nausea Stomach pain This list may not describe all possible side effects. Call your doctor for medical advice about side effects. You may report side effects to FDA at 1-800-FDA-1088. Where should I keep my medication? This medication is given in a hospital or clinic. It will not be stored at home. NOTE: This sheet is a summary. It may not cover all possible information. If you have questions about this medicine, talk to your doctor, pharmacist, or health care provider.  2024 Elsevier/Gold Standard (2021-08-08 00:00:00)

## 2023-08-03 ENCOUNTER — Telehealth: Payer: Self-pay | Admitting: Hematology

## 2023-08-03 NOTE — Telephone Encounter (Signed)
 Spoke with patient confirming upcoming appointment

## 2023-08-05 LAB — MULTIPLE MYELOMA PANEL, SERUM
Albumin SerPl Elph-Mcnc: 3.6 g/dL (ref 2.9–4.4)
Albumin/Glob SerPl: 1.4 (ref 0.7–1.7)
Alpha 1: 0.2 g/dL (ref 0.0–0.4)
Alpha2 Glob SerPl Elph-Mcnc: 0.7 g/dL (ref 0.4–1.0)
B-Globulin SerPl Elph-Mcnc: 0.8 g/dL (ref 0.7–1.3)
Gamma Glob SerPl Elph-Mcnc: 0.9 g/dL (ref 0.4–1.8)
Globulin, Total: 2.6 g/dL (ref 2.2–3.9)
IgA: 76 mg/dL (ref 64–422)
IgG (Immunoglobin G), Serum: 925 mg/dL (ref 586–1602)
IgM (Immunoglobulin M), Srm: 51 mg/dL (ref 26–217)
Total Protein ELP: 6.2 g/dL (ref 6.0–8.5)

## 2023-08-08 ENCOUNTER — Other Ambulatory Visit: Payer: Self-pay | Admitting: Hematology

## 2023-08-08 ENCOUNTER — Encounter: Payer: Self-pay | Admitting: Hematology

## 2023-08-08 DIAGNOSIS — C9000 Multiple myeloma not having achieved remission: Secondary | ICD-10-CM

## 2023-08-09 ENCOUNTER — Encounter: Payer: Self-pay | Admitting: Hematology

## 2023-08-12 ENCOUNTER — Other Ambulatory Visit: Payer: Self-pay | Admitting: Hematology

## 2023-08-12 DIAGNOSIS — Z833 Family history of diabetes mellitus: Secondary | ICD-10-CM | POA: Diagnosis not present

## 2023-08-12 DIAGNOSIS — E119 Type 2 diabetes mellitus without complications: Secondary | ICD-10-CM | POA: Diagnosis not present

## 2023-08-12 DIAGNOSIS — M1990 Unspecified osteoarthritis, unspecified site: Secondary | ICD-10-CM | POA: Diagnosis not present

## 2023-08-12 DIAGNOSIS — E785 Hyperlipidemia, unspecified: Secondary | ICD-10-CM | POA: Diagnosis not present

## 2023-08-12 DIAGNOSIS — F325 Major depressive disorder, single episode, in full remission: Secondary | ICD-10-CM | POA: Diagnosis not present

## 2023-08-12 DIAGNOSIS — C9000 Multiple myeloma not having achieved remission: Secondary | ICD-10-CM | POA: Diagnosis not present

## 2023-08-12 DIAGNOSIS — Z008 Encounter for other general examination: Secondary | ICD-10-CM | POA: Diagnosis not present

## 2023-08-12 DIAGNOSIS — G620 Drug-induced polyneuropathy: Secondary | ICD-10-CM | POA: Diagnosis not present

## 2023-08-12 DIAGNOSIS — I10 Essential (primary) hypertension: Secondary | ICD-10-CM | POA: Diagnosis not present

## 2023-08-12 DIAGNOSIS — H409 Unspecified glaucoma: Secondary | ICD-10-CM | POA: Diagnosis not present

## 2023-08-12 DIAGNOSIS — Z7982 Long term (current) use of aspirin: Secondary | ICD-10-CM | POA: Diagnosis not present

## 2023-08-12 DIAGNOSIS — J309 Allergic rhinitis, unspecified: Secondary | ICD-10-CM | POA: Diagnosis not present

## 2023-08-12 DIAGNOSIS — Z8249 Family history of ischemic heart disease and other diseases of the circulatory system: Secondary | ICD-10-CM | POA: Diagnosis not present

## 2023-08-13 ENCOUNTER — Encounter: Payer: Self-pay | Admitting: Hematology

## 2023-08-17 ENCOUNTER — Other Ambulatory Visit: Payer: Self-pay

## 2023-08-17 DIAGNOSIS — M545 Low back pain, unspecified: Secondary | ICD-10-CM | POA: Diagnosis not present

## 2023-08-17 DIAGNOSIS — M25561 Pain in right knee: Secondary | ICD-10-CM | POA: Diagnosis not present

## 2023-08-17 DIAGNOSIS — C9000 Multiple myeloma not having achieved remission: Secondary | ICD-10-CM

## 2023-08-17 DIAGNOSIS — M79672 Pain in left foot: Secondary | ICD-10-CM | POA: Diagnosis not present

## 2023-08-17 DIAGNOSIS — M79671 Pain in right foot: Secondary | ICD-10-CM | POA: Diagnosis not present

## 2023-08-17 MED ORDER — POTASSIUM CHLORIDE CRYS ER 20 MEQ PO TBCR: 20.0000 meq | EXTENDED_RELEASE_TABLET | Freq: Two times a day (BID) | ORAL | 0 refills | Status: DC

## 2023-08-26 ENCOUNTER — Other Ambulatory Visit: Payer: Self-pay

## 2023-08-26 DIAGNOSIS — C9000 Multiple myeloma not having achieved remission: Secondary | ICD-10-CM

## 2023-08-26 MED ORDER — GABAPENTIN 300 MG PO CAPS: 300.0000 mg | ORAL_CAPSULE | Freq: Three times a day (TID) | ORAL | 0 refills | Status: DC

## 2023-09-06 ENCOUNTER — Other Ambulatory Visit: Payer: Self-pay | Admitting: Hematology

## 2023-09-06 DIAGNOSIS — C9000 Multiple myeloma not having achieved remission: Secondary | ICD-10-CM

## 2023-09-07 ENCOUNTER — Other Ambulatory Visit: Payer: Self-pay | Admitting: Hematology

## 2023-09-07 DIAGNOSIS — C9000 Multiple myeloma not having achieved remission: Secondary | ICD-10-CM

## 2023-09-14 DIAGNOSIS — H401123 Primary open-angle glaucoma, left eye, severe stage: Secondary | ICD-10-CM | POA: Diagnosis not present

## 2023-09-14 DIAGNOSIS — Z961 Presence of intraocular lens: Secondary | ICD-10-CM | POA: Diagnosis not present

## 2023-09-15 ENCOUNTER — Other Ambulatory Visit: Payer: Self-pay | Admitting: Hematology

## 2023-09-15 DIAGNOSIS — C9000 Multiple myeloma not having achieved remission: Secondary | ICD-10-CM

## 2023-09-16 ENCOUNTER — Encounter: Payer: Self-pay | Admitting: Hematology

## 2023-10-05 ENCOUNTER — Other Ambulatory Visit: Payer: Self-pay | Admitting: Hematology

## 2023-10-05 DIAGNOSIS — C9000 Multiple myeloma not having achieved remission: Secondary | ICD-10-CM

## 2023-10-07 ENCOUNTER — Other Ambulatory Visit: Payer: Self-pay

## 2023-10-07 DIAGNOSIS — C9000 Multiple myeloma not having achieved remission: Secondary | ICD-10-CM

## 2023-10-07 MED ORDER — DULOXETINE HCL 60 MG PO CPEP
60.0000 mg | ORAL_CAPSULE | Freq: Every day | ORAL | 0 refills | Status: DC
Start: 2023-10-07 — End: 2023-11-10

## 2023-10-11 ENCOUNTER — Ambulatory Visit: Admitting: Podiatry

## 2023-10-11 DIAGNOSIS — B351 Tinea unguium: Secondary | ICD-10-CM

## 2023-10-11 DIAGNOSIS — M79675 Pain in left toe(s): Secondary | ICD-10-CM

## 2023-10-11 DIAGNOSIS — M79674 Pain in right toe(s): Secondary | ICD-10-CM

## 2023-10-11 NOTE — Progress Notes (Unsigned)
       Subjective:  Patient ID: Tracy Fisher, female    DOB: [birth year],  MRN: [id number]  Tracy Fisher presents to clinic today for:  Chief Complaint  Patient presents with   DFc    " I am here for a nail trim"    Patient notes nails are thick, discolored, elongated and painful in shoegear when trying to ambulate.   PCP is Victorio Grave, MD.  Past Medical History:  Diagnosis Date   Bone cancer (HCC) 2018   started chemo pill in  2018   Diabetes mellitus without complication (HCC)    Hypertension    Vocal cord cyst     Past Surgical History:  Procedure Laterality Date   NO PAST SURGERIES      Allergies  Allergen Reactions   Bortezomib Other (See Comments)    paresthesias to bilateral hands and feet.  Soreness/neuropathy   Sulfa Antibiotics Itching    Review of Systems: Negative except as noted in the HPI.  Objective:  Tracy Fisher is a pleasant 78 y.o. female in NAD. AAO x 3.  Vascular Examination: Capillary refill time is 3-5 seconds to toes bilateral. Palpable pedal pulses b/l LE. Digital hair present b/l.  Skin temperature gradient WNL b/l.   Dermatological Examination: Pedal skin with normal turgor, texture and tone b/l. No open wounds. No interdigital macerations b/l. Toenails x10 are 3mm thick, discolored, dystrophic with subungual debris. There is pain with compression of the nail plates.  They are elongated x10.    Assessment/Plan: 1. Pain due to onychomycosis of toenails of both feet    The mycotic toenails were sharply debrided x10 with sterile nail nippers and a power debriding burr to decrease bulk/thickness and length.    Return in about 3 months (around 01/10/2024) for [facility].   Joe Murders, DPM, FACFAS [facility]     [address] [place], [place] [address]                Office [phone]  Fax [phone]

## 2023-10-12 ENCOUNTER — Other Ambulatory Visit: Payer: Self-pay

## 2023-10-12 DIAGNOSIS — C9000 Multiple myeloma not having achieved remission: Secondary | ICD-10-CM

## 2023-10-12 MED ORDER — ACYCLOVIR 400 MG PO TABS: 400.0000 mg | ORAL_TABLET | Freq: Two times a day (BID) | ORAL | 3 refills | Status: DC

## 2023-10-14 ENCOUNTER — Encounter: Payer: Self-pay | Admitting: Hematology

## 2023-10-14 ENCOUNTER — Other Ambulatory Visit: Payer: Self-pay | Admitting: Hematology

## 2023-10-14 DIAGNOSIS — C9000 Multiple myeloma not having achieved remission: Secondary | ICD-10-CM

## 2023-10-14 MED ORDER — GABAPENTIN 300 MG PO CAPS: 300.0000 mg | ORAL_CAPSULE | Freq: Three times a day (TID) | ORAL | 0 refills | Status: DC

## 2023-10-15 ENCOUNTER — Other Ambulatory Visit: Payer: Self-pay

## 2023-10-15 DIAGNOSIS — C9000 Multiple myeloma not having achieved remission: Secondary | ICD-10-CM

## 2023-10-15 MED ORDER — POTASSIUM CHLORIDE CRYS ER 20 MEQ PO TBCR: 20.0000 meq | EXTENDED_RELEASE_TABLET | Freq: Two times a day (BID) | ORAL | 0 refills | Status: DC

## 2023-10-18 ENCOUNTER — Ambulatory Visit: Payer: Medicare HMO | Admitting: Podiatry

## 2023-10-19 ENCOUNTER — Other Ambulatory Visit: Payer: Self-pay | Admitting: Hematology

## 2023-10-19 DIAGNOSIS — C9000 Multiple myeloma not having achieved remission: Secondary | ICD-10-CM

## 2023-10-27 ENCOUNTER — Other Ambulatory Visit: Payer: Self-pay | Admitting: Hematology

## 2023-10-27 DIAGNOSIS — C9000 Multiple myeloma not having achieved remission: Secondary | ICD-10-CM

## 2023-10-28 DIAGNOSIS — J069 Acute upper respiratory infection, unspecified: Secondary | ICD-10-CM | POA: Diagnosis not present

## 2023-10-29 ENCOUNTER — Other Ambulatory Visit: Payer: Self-pay

## 2023-10-29 DIAGNOSIS — D472 Monoclonal gammopathy: Secondary | ICD-10-CM

## 2023-11-01 ENCOUNTER — Ambulatory Visit: Payer: Medicare HMO | Admitting: Hematology

## 2023-11-01 ENCOUNTER — Other Ambulatory Visit: Payer: Medicare HMO

## 2023-11-01 ENCOUNTER — Inpatient Hospital Stay: Payer: Medicare HMO

## 2023-11-01 ENCOUNTER — Inpatient Hospital Stay: Payer: Medicare HMO | Admitting: Hematology

## 2023-11-01 ENCOUNTER — Ambulatory Visit: Payer: Medicare HMO

## 2023-11-09 ENCOUNTER — Other Ambulatory Visit: Payer: Self-pay | Admitting: Hematology

## 2023-11-09 DIAGNOSIS — C9000 Multiple myeloma not having achieved remission: Secondary | ICD-10-CM

## 2023-11-11 DIAGNOSIS — D849 Immunodeficiency, unspecified: Secondary | ICD-10-CM | POA: Diagnosis not present

## 2023-11-11 DIAGNOSIS — C9001 Multiple myeloma in remission: Secondary | ICD-10-CM | POA: Diagnosis not present

## 2023-11-11 DIAGNOSIS — G620 Drug-induced polyneuropathy: Secondary | ICD-10-CM | POA: Diagnosis not present

## 2023-11-11 DIAGNOSIS — J40 Bronchitis, not specified as acute or chronic: Secondary | ICD-10-CM | POA: Diagnosis not present

## 2023-11-15 ENCOUNTER — Other Ambulatory Visit: Payer: Self-pay

## 2023-11-15 DIAGNOSIS — C9000 Multiple myeloma not having achieved remission: Secondary | ICD-10-CM

## 2023-11-15 MED ORDER — LENALIDOMIDE 10 MG PO CAPS: 10.0000 mg | ORAL_CAPSULE | Freq: Every day | ORAL | 0 refills | Status: AC

## 2023-11-23 NOTE — Progress Notes (Signed)
 HEMATOLOGY/ONCOLOGY CLINIC NOTE  DOS: 11/24/2023  Patient Care Team: Victorio Grave, MD as PCP - General (Family Medicine) Frankie Israel, MD as Consulting Physician (Hematology)  CHIEF COMPLAINTS/PURPOSE OF VISIT:  Follow-up for continued evaluation management of multiple myeloma  HISTORY OF PRESENTING ILLNESS: Please see previous note  INTERVAL HISTORY  Tracy Fisher is a 78 y.o. female here for continued evaluation and management of her multiple myeloma on maintenance Revlimid .  Patient was last seen by me on 08/02/2023 and reported  intermittent upper back pain, post-nasal congestion due to allergies, mild diarrhea, and stable neuropathy.    She reports that she had an acute upper respiratory infection recently, which lasted 3 weeks, and notes that she tested negative for COVID -19. She was taking mucinex and cough syrup which did not improve symptoms. Patient reports that she was put on an antibiotic, possibly Doxycycline. Her respiratory infection has improved at this time.   She has been taking revlimid  regularly.  Patient reports that she had a fall during the night but denies any injuries.  Her cough has improved.   Patient reports diarrhea sometimes.   Patient reports having clear phlegm and her infection has completely resolved otherwise.   She notes that her phlegm was previously yellow at a certain point.   She denies any abdominal pain at this time.  In regards to potassium in her diet, she does generally consume bananas for breakfast.   She has generally been eating well and her weight has been stable at 164 pounds.   Patient notes that she may travel to the beach some time over the summer.   Oncology History  Smoldering multiple myeloma (SMM)  Multiple myeloma (HCC)  08/28/2020 Initial Diagnosis   Multiple myeloma (HCC)   09/09/2020 - 10/07/2020 Chemotherapy         01/20/2021 - 07/07/2021 Chemotherapy   Patient is on Treatment Plan : MYELOMA  NEWLY DIAGNOSED TRANSPLANT CANDIDATE DaraVRd (Daratumumab  IV) q21d x 6 Cycles (Induction/Consolidation)       MEDICAL HISTORY:  Past Medical History:  Diagnosis Date   Bone cancer (HCC) 2018   started chemo pill in  2018   Diabetes mellitus without complication (HCC)    Hypertension    Vocal cord cyst     SURGICAL HISTORY: Past Surgical History:  Procedure Laterality Date   NO PAST SURGERIES      SOCIAL HISTORY: Social History   Socioeconomic History   Marital status: Divorced    Spouse name: Not on file   Number of children: Not on file   Years of education: Not on file   Highest education level: Not on file  Occupational History   Not on file  Tobacco Use   Smoking status: Never   Smokeless tobacco: Never  Vaping Use   Vaping status: Never Used  Substance and Sexual Activity   Alcohol use: No   Drug use: Never   Sexual activity: Not on file  Other Topics Concern   Not on file  Social History Narrative   Not on file   Social Drivers of Health   Financial Resource Strain: Not on file  Food Insecurity: Not on file  Transportation Needs: Not on file  Physical Activity: Not on file  Stress: Not on file  Social Connections: Not on file  Intimate Partner Violence: Not on file    FAMILY HISTORY: Family History  Problem Relation Age of Onset   Breast cancer Neg Hx    Neuropathy Neg Hx  ALLERGIES:  is allergic to bortezomib  and sulfa antibiotics.  MEDICATIONS:  Current Outpatient Medications  Medication Sig Dispense Refill   acyclovir  (ZOVIRAX ) 400 MG tablet Take 1 tablet (400 mg total) by mouth 2 (two) times daily. 60 tablet 3   amLODipine  (NORVASC ) 5 MG tablet      aspirin EC 81 MG tablet Take 81 mg by mouth daily. Swallow whole.     atorvastatin  (LIPITOR) 20 MG tablet      b complex vitamins capsule Take 1 capsule by mouth daily. 30 capsule 11   brimonidine  (ALPHAGAN ) 0.2 % ophthalmic solution Place 1 drop into both eyes 2 (two) times daily.   3    Calcium Carbonate-Vitamin D  (CALCIUM + D PO) Take 1 tablet by mouth daily.     dorzolamide -timolol  (COSOPT ) 22.3-6.8 MG/ML ophthalmic solution Place 1 drop into both eyes 2 (two) times daily.   12   DULoxetine  (CYMBALTA ) 60 MG capsule Take 1 capsule by mouth once daily 30 capsule 0   gabapentin  (NEURONTIN ) 300 MG capsule Take 1 capsule (300 mg total) by mouth 3 (three) times daily. 90 capsule 0   lenalidomide  (REVLIMID ) 10 MG capsule Take 1 capsule (10 mg total) by mouth daily for 21 days. Take 1 capsule (10 mg total) by mouth daily for 21 days and then take 7 days off. Repeat. 21 capsule 0   LUMIGAN  0.01 % SOLN INSTILL 1 DROP INTO EACH EYE ONCE DAILY     methylPREDNISolone  (MEDROL  DOSEPAK) 4 MG TBPK tablet Take as directed on the box 21 tablet 0   Multiple Vitamin (MULTIVITAMIN) capsule Take by mouth.     ONETOUCH VERIO test strip USE STRIP TO CHECK GLUCOSE TWICE DAILY     potassium chloride  SA (KLOR-CON  M) 20 MEQ tablet Take 1 tablet by mouth twice daily 60 tablet 0   No current facility-administered medications for this visit.    REVIEW OF SYSTEMS:    10 Point review of Systems was done is negative except as noted above.   PHYSICAL EXAMINATION: ECOG PERFORMANCE STATUS: 1 - Symptomatic but completely ambulatory  Vitals:   11/24/23 1423  BP: (!) 154/77  Pulse: 78  Resp: 18  Temp: (!) 97.2 F (36.2 C)  SpO2: 95%   Filed Weights   11/24/23 1423  Weight: 164 lb 3.2 oz (74.5 kg)   GENERAL:alert, in no acute distress and comfortable SKIN: no acute rashes, no significant lesions EYES: conjunctiva are pink and non-injected, sclera anicteric OROPHARYNX: MMM, no exudates, no oropharyngeal erythema or ulceration NECK: supple, no JVD LYMPH:  no palpable lymphadenopathy in the cervical, axillary or inguinal regions LUNGS: clear to auscultation b/l with normal respiratory effort HEART: regular rate & rhythm ABDOMEN:  normoactive bowel sounds , non tender, not distended. Extremity:  no pedal edema PSYCH: alert & oriented x 3 with fluent speech NEURO: no focal motor/sensory deficits    LABORATORY DATA:      Latest Ref Rng & Units 11/24/2023    1:57 PM 08/02/2023   12:00 PM 05/05/2023    9:45 AM  CBC  WBC 4.0 - 10.5 K/uL 2.7  2.9  3.2   Hemoglobin 12.0 - 15.0 g/dL 65.7  84.6  96.2   Hematocrit 36.0 - 46.0 % 34.3  36.8  37.2   Platelets 150 - 400 K/uL 163  164  132    .    Latest Ref Rng & Units 11/24/2023    1:57 PM 08/02/2023   12:00 PM 05/05/2023  9:45 AM  CMP  Glucose 70 - 99 mg/dL 161  096  045   BUN 8 - 23 mg/dL 10  11  15    Creatinine 0.44 - 1.00 mg/dL 4.09  8.11  9.14   Sodium 135 - 145 mmol/L 141  139  139   Potassium 3.5 - 5.1 mmol/L 3.5  3.6  3.8   Chloride 98 - 111 mmol/L 110  109  109   CO2 22 - 32 mmol/L 24  24  25    Calcium 8.9 - 10.3 mg/dL 9.2  9.6  9.1   Total Protein 6.5 - 8.1 g/dL 6.7  6.6  6.8   Total Bilirubin 0.0 - 1.2 mg/dL 0.4  0.4  0.5   Alkaline Phos 38 - 126 U/L 51  46  48   AST 15 - 41 U/L 23  18  21    ALT 0 - 44 U/L 31  22  28      RADIOGRAPHIC STUDIES: I have personally reviewed the radiological images as listed and agreed with the findings in the report. No results found.  ASSESSMENT & PLAN:   78 y.o. female here for follow-up of her multiple myeloma and next treatment   #1 IgG kappa multiple myeloma.   Initially presented as smoldering myeloma and had M spike of 1.9 g/dL which was noted to be IgG kappa on immunofixation electrophoresis. -Bone survey shows no overt evidence of osseous involvement suggestive of multiple myeloma. Beta 2 microglobulin, sedimentation rate an LDH levels are within normal limits. -Bone marrow biopsy shows previously showed 17% monoclonal plasma cells Cytogenetics/MolCy - trisomy 11 (consistent with myeloma)   -Bone study from 09/27/17 showed some osteopenic changes without osteoporosis. Previous bone studies are not yet available  -09/08/2019 FISH Panel revealed "No Mutations  Detected". -09/08/2019 Cytogenetics revealed "Normal female karyotype". -09/08/2019 BM Bx Report ([id number])  revealed "BONE MARROW, ASPIRATE, CLOT, CORE: -Hypercellular bone marrow for age with plasma cell neoplasm PERIPHERAL BLOOD: -Macrocytic anemia -Leukopenia." - 20% Plasma Cells  -09/15/2019 PET/CT ([id number]) revealed "1. No findings of active myeloma or other hypermetabolic malignancy. 2.  Aortic Atherosclerosis (ICD10-I70.0). 3. Photopenic hypodense hepatic lesions most compatible with cysts."   A treatment initiation active Multiple myeloma Bone Marrow Biopsy with nearly 60% plasma cells. With M spike of 3.6g/dl Starting to develop mild anemia (does not meet anemia criteria yet) No hypercalcemia, renal insufficiency  PET/CT 07/31/2020 - No abnormal hypermetabolism in the neck, chest, abdomen or pelvis.  -PET CT done 07/18/2021 - No hypermetabolic bone lesions suggestive of multiple myeloma.   #2  Chronic leukopenia with intermittent neutropenia. -B12 and folate levels within normal limits. -Reasonable to take a daily vitamin B complex tablet. -No indication for G-CSF at this time. -Now also additionally possibly due to Revlimid .   #3.  Mild anemia and leukopenia likely related to myeloma and Revlimid .  #4 rh/o influenza A infection in February 2023 -Patient's influenza A URI has resolved.  She was given Augmentin  for secondary bacterial infection by her primary care physician.  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 CBC showed WBC of 2.7K, hemoglobin of 12.1, and platelets of 163K. -hgb normal -platelets normal -WBCs mildly low with Revlimid . WBCs are not concerning as she is not having infection at this time -CMP stable; potassium low normal; blood glucose level 113 mg/dL -recommend potassium-rich foods  in the diet such as orange  juice/bananas -her last myeloma lab continued to show undetectable M protein and showed that patient was still in remission -Patient has no clinical signs or lab evidence suggestive of multiple myeloma progression at this time  -Patient can continue with current dose of Revlimid .  -continue Imodium as needed for diarrhea.  -proceed with Zometa  in 3 months -advised patient to let us  know in the future if she has an acute infection so we can guide her on whether to hold revlimid  or not   FOLLOW UP: RTC with Dr Salomon Cree with labs and subsequent dose of zometa  in 3 months   The total time spent in the appointment was 23 minutes* .  All of the patient's questions were answered with apparent satisfaction. The patient knows to call the clinic with any problems, questions or concerns.   Jacquelyn Matt MD MS AAHIVMS [facility] Hematology/Oncology Physician [facility]  .*Total Encounter Time as defined by the Centers for Medicare and Medicaid Services includes, in addition to the face-to-face time of a patient visit (documented in the note above) non-face-to-face time: obtaining and reviewing outside history, ordering and reviewing medications, tests or procedures, care coordination (communications with other health care professionals or caregivers) and documentation in the medical record.    I,Mitra Faeizi,acting as a Neurosurgeon for Jacquelyn Matt, MD.,have documented all relevant documentation on the behalf of Jacquelyn Matt, MD,as directed by  Jacquelyn Matt, MD while in the presence of Jacquelyn Matt, MD.  .I have reviewed the above documentation for accuracy and completeness, and I agree with the above. .Asmar Brozek Kishore Johnny Latu MD

## 2023-11-24 ENCOUNTER — Other Ambulatory Visit: Payer: Self-pay | Admitting: *Deleted

## 2023-11-24 ENCOUNTER — Inpatient Hospital Stay: Admitting: Hematology

## 2023-11-24 ENCOUNTER — Inpatient Hospital Stay: Attending: Hematology

## 2023-11-24 ENCOUNTER — Inpatient Hospital Stay

## 2023-11-24 ENCOUNTER — Encounter: Payer: Self-pay | Admitting: Hematology

## 2023-11-24 VITALS — BP 145/80 | HR 74 | Temp 97.5°F | Resp 18

## 2023-11-24 VITALS — BP 154/77 | HR 78 | Temp 97.2°F | Resp 18 | Wt 164.2 lb

## 2023-11-24 DIAGNOSIS — D472 Monoclonal gammopathy: Secondary | ICD-10-CM

## 2023-11-24 DIAGNOSIS — Z79624 Long term (current) use of inhibitors of nucleotide synthesis: Secondary | ICD-10-CM | POA: Insufficient documentation

## 2023-11-24 DIAGNOSIS — D709 Neutropenia, unspecified: Secondary | ICD-10-CM | POA: Diagnosis not present

## 2023-11-24 DIAGNOSIS — M8580 Other specified disorders of bone density and structure, unspecified site: Secondary | ICD-10-CM | POA: Diagnosis not present

## 2023-11-24 DIAGNOSIS — Z7961 Long term (current) use of immunomodulator: Secondary | ICD-10-CM | POA: Diagnosis not present

## 2023-11-24 DIAGNOSIS — C9000 Multiple myeloma not having achieved remission: Secondary | ICD-10-CM

## 2023-11-24 DIAGNOSIS — Z7189 Other specified counseling: Secondary | ICD-10-CM

## 2023-11-24 DIAGNOSIS — Z7982 Long term (current) use of aspirin: Secondary | ICD-10-CM | POA: Diagnosis not present

## 2023-11-24 DIAGNOSIS — C9001 Multiple myeloma in remission: Secondary | ICD-10-CM | POA: Diagnosis not present

## 2023-11-24 DIAGNOSIS — M8589 Other specified disorders of bone density and structure, multiple sites: Secondary | ICD-10-CM

## 2023-11-24 DIAGNOSIS — Z79899 Other long term (current) drug therapy: Secondary | ICD-10-CM | POA: Diagnosis not present

## 2023-11-24 LAB — CMP (CANCER CENTER ONLY)
ALT: 31 U/L (ref 0–44)
AST: 23 U/L (ref 15–41)
Albumin: 4.2 g/dL (ref 3.5–5.0)
Alkaline Phosphatase: 51 U/L (ref 38–126)
Anion gap: 7 (ref 5–15)
BUN: 10 mg/dL (ref 8–23)
CO2: 24 mmol/L (ref 22–32)
Calcium: 9.2 mg/dL (ref 8.9–10.3)
Chloride: 110 mmol/L (ref 98–111)
Creatinine: 0.73 mg/dL (ref 0.44–1.00)
GFR, Estimated: >60 mL/min (ref >60)
Glucose, Bld: 113 mg/dL — ABNORMAL HIGH (ref 70–99)
Potassium: 3.5 mmol/L (ref 3.5–5.1)
Sodium: 141 mmol/L (ref 135–145)
Total Bilirubin: 0.4 mg/dL (ref 0.0–1.2)
Total Protein: 6.7 g/dL (ref 6.5–8.1)

## 2023-11-24 LAB — CBC WITH DIFFERENTIAL (CANCER CENTER ONLY)
Abs Immature Granulocytes: 0 10*3/uL (ref 0.00–0.07)
Basophils Absolute: 0.1 10*3/uL (ref 0.0–0.1)
Basophils Relative: 2 %
Eosinophils Absolute: 0.1 10*3/uL (ref 0.0–0.5)
Eosinophils Relative: 5 %
HCT: 34.3 % — ABNORMAL LOW (ref 36.0–46.0)
Hemoglobin: 12.1 g/dL (ref 12.0–15.0)
Immature Granulocytes: 0 %
Lymphocytes Relative: 46 %
Lymphs Abs: 1.2 10*3/uL (ref 0.7–4.0)
MCH: 33.2 pg (ref 26.0–34.0)
MCHC: 35.3 g/dL (ref 30.0–36.0)
MCV: 94.2 fL (ref 80.0–100.0)
Monocytes Absolute: 0.3 10*3/uL (ref 0.1–1.0)
Monocytes Relative: 11 %
Neutro Abs: 1 10*3/uL — ABNORMAL LOW (ref 1.7–7.7)
Neutrophils Relative %: 36 %
Platelet Count: 163 10*3/uL (ref 150–400)
RBC: 3.64 MIL/uL — ABNORMAL LOW (ref 3.87–5.11)
RDW: 13.9 % (ref 11.5–15.5)
WBC Count: 2.7 10*3/uL — ABNORMAL LOW (ref 4.0–10.5)
nRBC: 0 % (ref 0.0–0.2)

## 2023-11-24 MED ORDER — ZOLEDRONIC ACID 4 MG/100ML IV SOLN
4.0000 mg | Freq: Once | INTRAVENOUS | Status: AC
  Administered 2023-11-24: 4 mg via INTRAVENOUS
  Filled 2023-11-24: qty 100

## 2023-11-24 MED ORDER — SODIUM CHLORIDE 0.9 % IV SOLN: Freq: Once | INTRAVENOUS | Status: AC

## 2023-11-28 LAB — MULTIPLE MYELOMA PANEL, SERUM
Albumin SerPl Elph-Mcnc: 3.4 g/dL (ref 2.9–4.4)
Albumin/Glob SerPl: 1.3 (ref 0.7–1.7)
Alpha 1: 0.2 g/dL (ref 0.0–0.4)
Alpha2 Glob SerPl Elph-Mcnc: 0.8 g/dL (ref 0.4–1.0)
B-Globulin SerPl Elph-Mcnc: 0.8 g/dL (ref 0.7–1.3)
Gamma Glob SerPl Elph-Mcnc: 0.9 g/dL (ref 0.4–1.8)
Globulin, Total: 2.8 g/dL (ref 2.2–3.9)
IgA: 86 mg/dL (ref 64–422)
IgG (Immunoglobin G), Serum: 984 mg/dL (ref 586–1602)
IgM (Immunoglobulin M), Srm: 64 mg/dL (ref 26–217)
Total Protein ELP: 6.2 g/dL (ref 6.0–8.5)

## 2023-11-29 ENCOUNTER — Ambulatory Visit: Admitting: Physical Therapy

## 2023-11-30 ENCOUNTER — Encounter: Payer: Self-pay | Admitting: Hematology

## 2023-12-02 ENCOUNTER — Other Ambulatory Visit: Payer: Self-pay | Admitting: Hematology

## 2023-12-02 DIAGNOSIS — C9000 Multiple myeloma not having achieved remission: Secondary | ICD-10-CM

## 2023-12-07 ENCOUNTER — Other Ambulatory Visit: Payer: Self-pay

## 2023-12-07 DIAGNOSIS — C9001 Multiple myeloma in remission: Secondary | ICD-10-CM

## 2023-12-07 MED ORDER — LENALIDOMIDE 10 MG PO CAPS: 10.0000 mg | ORAL_CAPSULE | Freq: Every day | ORAL | 0 refills | Status: DC

## 2023-12-09 ENCOUNTER — Other Ambulatory Visit: Payer: Self-pay

## 2023-12-09 DIAGNOSIS — C9000 Multiple myeloma not having achieved remission: Secondary | ICD-10-CM

## 2023-12-10 ENCOUNTER — Encounter: Payer: Self-pay | Admitting: Hematology

## 2023-12-10 MED ORDER — GABAPENTIN 300 MG PO CAPS: 300.0000 mg | ORAL_CAPSULE | Freq: Three times a day (TID) | ORAL | 0 refills | Status: DC

## 2023-12-16 ENCOUNTER — Other Ambulatory Visit: Payer: Self-pay | Admitting: Hematology

## 2023-12-16 DIAGNOSIS — C9000 Multiple myeloma not having achieved remission: Secondary | ICD-10-CM

## 2023-12-24 ENCOUNTER — Other Ambulatory Visit: Payer: Self-pay | Admitting: Hematology

## 2023-12-24 DIAGNOSIS — C9000 Multiple myeloma not having achieved remission: Secondary | ICD-10-CM

## 2023-12-28 DIAGNOSIS — M25461 Effusion, right knee: Secondary | ICD-10-CM | POA: Diagnosis not present

## 2023-12-28 DIAGNOSIS — M170 Bilateral primary osteoarthritis of knee: Secondary | ICD-10-CM | POA: Diagnosis not present

## 2023-12-28 DIAGNOSIS — M1712 Unilateral primary osteoarthritis, left knee: Secondary | ICD-10-CM | POA: Diagnosis not present

## 2023-12-28 DIAGNOSIS — M1711 Unilateral primary osteoarthritis, right knee: Secondary | ICD-10-CM | POA: Diagnosis not present

## 2024-01-03 ENCOUNTER — Other Ambulatory Visit: Payer: Self-pay | Admitting: Hematology

## 2024-01-03 ENCOUNTER — Other Ambulatory Visit: Payer: Self-pay

## 2024-01-03 DIAGNOSIS — C9001 Multiple myeloma in remission: Secondary | ICD-10-CM

## 2024-01-03 DIAGNOSIS — C9000 Multiple myeloma not having achieved remission: Secondary | ICD-10-CM

## 2024-01-03 MED ORDER — LENALIDOMIDE 10 MG PO CAPS: 10.0000 mg | ORAL_CAPSULE | Freq: Every day | ORAL | 0 refills | Status: DC

## 2024-01-17 ENCOUNTER — Ambulatory Visit (INDEPENDENT_AMBULATORY_CARE_PROVIDER_SITE_OTHER): Admitting: Podiatry

## 2024-01-17 DIAGNOSIS — M79675 Pain in left toe(s): Secondary | ICD-10-CM

## 2024-01-17 DIAGNOSIS — B351 Tinea unguium: Secondary | ICD-10-CM

## 2024-01-17 DIAGNOSIS — M79674 Pain in right toe(s): Secondary | ICD-10-CM | POA: Diagnosis not present

## 2024-01-17 NOTE — Progress Notes (Signed)
    Subjective:  Patient ID: Tracy Fisher, female    DOB: 1946/03/31,  MRN: [id number]  Tracy Fisher presents to clinic today for:  Chief Complaint  Patient presents with   [facility]    Last A1c: unsure. Last glucose check was 93. Takes ASA 81 mg. Needs nail care.    Patient notes nails are thick, discolored, elongated and painful in shoegear when trying to ambulate.    PCP is Teresa Channel, MD.  Past Medical History:  Diagnosis Date   Bone cancer (HCC) 2018   started chemo pill in  2018   Diabetes mellitus without complication (HCC)    Hypertension    Vocal cord cyst    Past Surgical History:  Procedure Laterality Date   NO PAST SURGERIES     Allergies  Allergen Reactions   Bortezomib  Other (See Comments)    paresthesias to bilateral hands and feet.  Soreness/neuropathy  Other Reaction(s): neuropathy   Sulfa Antibiotics Itching and Other (See Comments)   Sulfamethoxazole     Other Reaction(s): itching/ break out    Review of Systems: Negative except as noted in the HPI.  Objective:  Tracy Fisher is a pleasant 78 y.o. female in NAD. AAO x 3.  Vascular Examination: Capillary refill time is 3-5 seconds to toes bilateral. Palpable pedal pulses b/l LE. Digital hair present b/l.  Skin temperature gradient WNL b/l. No varicosities b/l. No cyanosis noted b/l.   Dermatological Examination: Pedal skin with normal turgor, texture and tone b/l. No open wounds. No interdigital macerations b/l. Toenails x10 are 3mm thick, discolored, dystrophic with subungual debris. There is pain with compression of the nail plates.  They are elongated x10  Assessment/Plan: 1. Pain due to onychomycosis of toenails of both feet     The mycotic toenails were sharply debrided x10 with sterile nail nippers and a power debriding burr to decrease bulk/thickness and length.    Return in about 3 months (around 04/18/2024) for [facility].   Awanda CHARM Imperial, DPM, FACFAS [facility]     [address] [place], [place] [address]                Office [phone]  Fax [phone]

## 2024-01-21 ENCOUNTER — Other Ambulatory Visit: Payer: Self-pay | Admitting: Hematology

## 2024-01-21 DIAGNOSIS — C9000 Multiple myeloma not having achieved remission: Secondary | ICD-10-CM

## 2024-01-25 ENCOUNTER — Other Ambulatory Visit: Payer: Self-pay | Admitting: Physician Assistant

## 2024-01-25 DIAGNOSIS — C9000 Multiple myeloma not having achieved remission: Secondary | ICD-10-CM

## 2024-01-31 ENCOUNTER — Other Ambulatory Visit: Payer: Self-pay | Admitting: Hematology

## 2024-01-31 DIAGNOSIS — C9000 Multiple myeloma not having achieved remission: Secondary | ICD-10-CM

## 2024-02-01 ENCOUNTER — Emergency Department (HOSPITAL_COMMUNITY)

## 2024-02-01 ENCOUNTER — Emergency Department (HOSPITAL_COMMUNITY)
Admission: EM | Admit: 2024-02-01 | Discharge: 2024-02-01 | Disposition: A | Discharge status: Home / Self Care | Source: Ambulatory Visit | Attending: Emergency Medicine | Admitting: Emergency Medicine

## 2024-02-01 ENCOUNTER — Encounter (HOSPITAL_COMMUNITY): Payer: Self-pay

## 2024-02-01 DIAGNOSIS — K429 Umbilical hernia without obstruction or gangrene: Secondary | ICD-10-CM | POA: Diagnosis not present

## 2024-02-01 DIAGNOSIS — R1013 Epigastric pain: Secondary | ICD-10-CM | POA: Insufficient documentation

## 2024-02-01 DIAGNOSIS — R109 Unspecified abdominal pain: Secondary | ICD-10-CM

## 2024-02-01 DIAGNOSIS — Z85830 Personal history of malignant neoplasm of bone: Secondary | ICD-10-CM | POA: Insufficient documentation

## 2024-02-01 DIAGNOSIS — R42 Dizziness and giddiness: Secondary | ICD-10-CM | POA: Insufficient documentation

## 2024-02-01 DIAGNOSIS — K529 Noninfective gastroenteritis and colitis, unspecified: Secondary | ICD-10-CM | POA: Diagnosis not present

## 2024-02-01 DIAGNOSIS — I10 Essential (primary) hypertension: Secondary | ICD-10-CM | POA: Insufficient documentation

## 2024-02-01 DIAGNOSIS — J069 Acute upper respiratory infection, unspecified: Secondary | ICD-10-CM | POA: Insufficient documentation

## 2024-02-01 DIAGNOSIS — Z7982 Long term (current) use of aspirin: Secondary | ICD-10-CM | POA: Diagnosis not present

## 2024-02-01 DIAGNOSIS — R0602 Shortness of breath: Secondary | ICD-10-CM | POA: Diagnosis not present

## 2024-02-01 DIAGNOSIS — S8002XA Contusion of left knee, initial encounter: Secondary | ICD-10-CM | POA: Diagnosis not present

## 2024-02-01 DIAGNOSIS — Z79899 Other long term (current) drug therapy: Secondary | ICD-10-CM | POA: Insufficient documentation

## 2024-02-01 DIAGNOSIS — B9789 Other viral agents as the cause of diseases classified elsewhere: Secondary | ICD-10-CM | POA: Diagnosis not present

## 2024-02-01 DIAGNOSIS — K7689 Other specified diseases of liver: Secondary | ICD-10-CM | POA: Diagnosis not present

## 2024-02-01 DIAGNOSIS — R519 Headache, unspecified: Secondary | ICD-10-CM | POA: Diagnosis not present

## 2024-02-01 DIAGNOSIS — E119 Type 2 diabetes mellitus without complications: Secondary | ICD-10-CM | POA: Diagnosis not present

## 2024-02-01 DIAGNOSIS — M542 Cervicalgia: Secondary | ICD-10-CM | POA: Diagnosis not present

## 2024-02-01 DIAGNOSIS — I959 Hypotension, unspecified: Secondary | ICD-10-CM | POA: Diagnosis not present

## 2024-02-01 DIAGNOSIS — W19XXXA Unspecified fall, initial encounter: Secondary | ICD-10-CM | POA: Diagnosis not present

## 2024-02-01 DIAGNOSIS — R531 Weakness: Secondary | ICD-10-CM | POA: Diagnosis not present

## 2024-02-01 LAB — COMPREHENSIVE METABOLIC PANEL WITH GFR
ALT: 33 U/L (ref 0–44)
AST: 33 U/L (ref 15–41)
Albumin: 3.9 g/dL (ref 3.5–5.0)
Alkaline Phosphatase: 61 U/L (ref 38–126)
Anion gap: 10 (ref 5–15)
BUN: 16 mg/dL (ref 8–23)
CO2: 24 mmol/L (ref 22–32)
Calcium: 9.4 mg/dL (ref 8.9–10.3)
Chloride: 101 mmol/L (ref 98–111)
Creatinine, Ser: 1.1 mg/dL — ABNORMAL HIGH (ref 0.44–1.00)
GFR, Estimated: 51 mL/min — ABNORMAL LOW (ref >60)
Glucose, Bld: 126 mg/dL — ABNORMAL HIGH (ref 70–99)
Potassium: 3.8 mmol/L (ref 3.5–5.1)
Sodium: 135 mmol/L (ref 135–145)
Total Bilirubin: 1 mg/dL (ref 0.0–1.2)
Total Protein: 7.6 g/dL (ref 6.5–8.1)

## 2024-02-01 LAB — CBC WITH DIFFERENTIAL/PLATELET
Abs Immature Granulocytes: 0.04 K/uL (ref 0.00–0.07)
Basophils Absolute: 0 K/uL (ref 0.0–0.1)
Basophils Relative: 1 %
Eosinophils Absolute: 0 K/uL (ref 0.0–0.5)
Eosinophils Relative: 0 %
HCT: 39 % (ref 36.0–46.0)
Hemoglobin: 12.7 g/dL (ref 12.0–15.0)
Immature Granulocytes: 1 %
Lymphocytes Relative: 13 %
Lymphs Abs: 1 K/uL (ref 0.7–4.0)
MCH: 32.3 pg (ref 26.0–34.0)
MCHC: 32.6 g/dL (ref 30.0–36.0)
MCV: 99.2 fL (ref 80.0–100.0)
Monocytes Absolute: 1.1 K/uL — ABNORMAL HIGH (ref 0.1–1.0)
Monocytes Relative: 14 %
Neutro Abs: 5.6 K/uL (ref 1.7–7.7)
Neutrophils Relative %: 71 %
Platelets: 143 K/uL — ABNORMAL LOW (ref 150–400)
RBC: 3.93 MIL/uL (ref 3.87–5.11)
RDW: 14.8 % (ref 11.5–15.5)
WBC: 7.7 K/uL (ref 4.0–10.5)
nRBC: 0 % (ref 0.0–0.2)

## 2024-02-01 LAB — LIPASE, BLOOD: Lipase: 25 U/L (ref 11–51)

## 2024-02-01 MED ORDER — SODIUM CHLORIDE 0.9 % IV BOLUS
1000.0000 mL | Freq: Once | INTRAVENOUS | Status: AC
  Administered 2024-02-01: 1000 mL via INTRAVENOUS

## 2024-02-01 MED ORDER — AMOXICILLIN-POT CLAVULANATE 875-125 MG PO TABS: 1.0000 | ORAL_TABLET | Freq: Two times a day (BID) | ORAL | 0 refills | Status: AC

## 2024-02-01 MED ORDER — ONDANSETRON 4 MG PO TBDP: ORAL_TABLET | ORAL | 0 refills | Status: AC

## 2024-02-01 MED ORDER — IOHEXOL 300 MG/ML  SOLN
100.0000 mL | Freq: Once | INTRAMUSCULAR | Status: AC | PRN
  Administered 2024-02-01: 100 mL via INTRAVENOUS

## 2024-02-01 NOTE — Discharge Instructions (Signed)
 Please return for abdominal pain fever or inability eat or drink or if you feel like you you might pass out.  Please follow-up with your doctor in the office to get your lab work rechecked in a week or 2.

## 2024-02-01 NOTE — ED Provider Notes (Signed)
 Received patient in turnover from Dr. Francesca.  Please see their note for further details of Hx, PE.  Briefly patient is a 78 y.o. female with a Abdominal Cramping and Weakness .  Hypotension in clinic sent for eval.  Not hypotensive here, plan for labs, CT.  Patient's metabolic panel with a mild bump in her creatinine.  Otherwise no obvious significant electrolyte abnormality.  No leukocytosis no anemia.  CT of the abdomen pelvis with perhaps diarrheal illness versus colitis CT of the head negative.  I discussed results with the patient.  She is concerned she is a cancer patient and that she might need some medication to help her at home.  Benign abdominal exam for me.  Oral trial here without issue.  Will trial antibiotics.  PCP follow-up.      Emil Share, DO 02/01/24 1925

## 2024-02-01 NOTE — ED Triage Notes (Signed)
 Pt BIB by ems from [facility] doctors office for weakness and abdominal cramping. She did have dizziness and a fall yesterday, but states she is not dizzy today. Denies hitting her head, not on blood thinners, VS Stable. A&Ox4

## 2024-02-01 NOTE — ED Provider Notes (Signed)
 [place] EMERGENCY DEPARTMENT AT [facility] Provider Note  CSN: [id number] Arrival date & time: 02/01/24 1254  Chief Complaint(s) Abdominal Cramping and Weakness  HPI Tracy Fisher is a 78 y.o. female history of multiple myeloma, diabetes, hypertension presenting to the emergency department with weakness.  Patient reports lightheadedness on standing for the past couple days, has had a couple of falls related to this.  Last fall was today.  Does not think she hit her head.  Does have small abrasion to left knee.  Has been ambulatory.  Initially went to primary doctor, they found she was hypotensive in the office and sent her to the ER.  She reports that she does not feel lightheaded when she is sitting down.  She does report a headache for the past couple of days also.  Has been having some upper abdominal pain and nausea but no vomiting, no diarrhea.  No urinary symptoms.  No fevers or chills.  No chest pain or back pain.   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 Authorizing Provider  acyclovir  (ZOVIRAX ) 400 MG tablet Take 1 tablet by mouth twice daily 01/03/24   Kale, Gautam Kishore, MD  amLODipine  (NORVASC ) 5 MG tablet  02/01/15   [provider]  aspirin EC 81 MG tablet Take 81 mg by mouth daily. Swallow whole.    [provider]  atorvastatin  (LIPITOR) 20 MG tablet  11/25/16   [provider]  b complex vitamins capsule Take 1 capsule by mouth daily. 10/10/20   Onesimo Emaline Brink, MD  brimonidine  (ALPHAGAN ) 0.2 % ophthalmic solution Place 1 drop into both eyes 2 (two) times daily.  01/30/18   [provider]  Calcium Carbonate-Vitamin D  (CALCIUM + D PO) Take 1 tablet by mouth daily.    [provider]  dorzolamide -timolol  (COSOPT ) 22.3-6.8 MG/ML ophthalmic solution Place 1 drop into both eyes 2 (two) times daily.  11/21/16   [provider]  DULoxetine  (CYMBALTA ) 60 MG capsule Take 1 capsule by mouth once daily 01/21/24   Kale, Gautam Kishore, MD  gabapentin  (NEURONTIN ) 300 MG capsule TAKE 1 CAPSULE BY MOUTH THREE TIMES DAILY 01/26/24   Kale, Gautam Kishore, MD  lenalidomide  (REVLIMID ) 10 MG capsule TAKE 1 CAPSULE BY MOUTH 1 TIME A DAY FOR 21 DAYS ON THEN 7 DAYS OFF, REPEAT CYCLE. 01/03/24   Onesimo Emaline Brink, MD  LUMIGAN  0.01 % SOLN INSTILL 1 DROP INTO EACH EYE ONCE DAILY 07/17/20   [provider]  methylPREDNISolone  (MEDROL  DOSEPAK) 4 MG TBPK tablet Take as directed on the box 05/23/22   Jerrol Agent, MD  Multiple Vitamin (MULTIVITAMIN) capsule Take by mouth.    [provider]  [facility] VERIO test strip USE STRIP TO CHECK GLUCOSE TWICE DAILY 11/07/20   [provider]  Potassium Chloride  ER 20 MEQ TBCR Take 1 tablet by mouth twice daily 01/31/24  Onesimo Emaline Brink, MD  potassium chloride  SA (KLOR-CON  M) 20 MEQ tablet Take 1 tablet by mouth twice daily 12/24/23   Kale, Gautam Kishore, MD  prochlorperazine  (COMPAZINE ) 10 MG tablet Take 1 tablet (10 mg total) by mouth every 6 (six) hours as needed (Nausea or vomiting). 08/28/20 01/08/21  Onesimo Emaline Brink, MD                                                                                                                                    Past Surgical History Past Surgical History:  Procedure Laterality Date   NO PAST SURGERIES     Family History Family History   Problem Relation Age of Onset   Breast cancer Neg Hx    Neuropathy Neg Hx     Social History Social History   Tobacco Use   Smoking status: Never   Smokeless tobacco: Never  Vaping Use   Vaping status: Never Used  Substance Use Topics   Alcohol use: No   Drug use: Never   Allergies Bortezomib , Sulfa antibiotics, and Sulfamethoxazole  Review of Systems Review of Systems  All other systems reviewed and are negative.   Physical Exam Vital Signs  I have reviewed the triage vital signs BP 129/87   Pulse 84   Temp 98.3 F (36.8 C)   Resp 16   Ht 5' 4 (1.626 m)   Wt 68.9 kg   SpO2 94%   BMI 26.09 kg/m  Physical Exam Vitals and nursing note reviewed.  Constitutional:      General: She is not in acute distress.    Appearance: She is well-developed.  HENT:     Head: Normocephalic and atraumatic.     Mouth/Throat:     Mouth: Mucous membranes are moist.  Eyes:     Pupils: Pupils are equal, round, and reactive to light.  Cardiovascular:     Rate and Rhythm: Normal rate and regular rhythm.     Heart sounds: No murmur heard. Pulmonary:     Effort: Pulmonary effort is normal. No respiratory distress.     Breath sounds: Normal breath sounds.  Abdominal:     General: Abdomen is flat.     Palpations: Abdomen is soft.     Tenderness: There is abdominal tenderness (epigastric).  Musculoskeletal:        General: No tenderness.     Right lower leg: No edema.     Left lower leg: No edema.     Comments: No midline C, T, L-spine tenderness.  No chest wall tenderness or crepitus.  Full painless range of motion at the bilateral upper extremities including the shoulders, elbows, wrists, hand and fingers, and in the bilateral lower extremities including the hips, knees, ankle, toes.  No focal bony tenderness, injury or deformity.   Skin:    General: Skin is warm and dry.     Comments: Very small 1 x 1  cm abrasion over the left knee  Neurological:     General: No focal  deficit present.     Mental Status: She is alert. Mental status is at baseline.     Comments: Cranial nerves II through XII intact, strength 5 out of 5 in the bilateral upper and lower extremities, no sensory deficit to light touch, no dysmetria on finger-nose-finger testing  Psychiatric:        Mood and Affect: Mood normal.        Behavior: Behavior normal.     ED Results and Treatments Labs (all labs ordered are listed, but only abnormal results are displayed) Labs Reviewed  COMPREHENSIVE METABOLIC PANEL WITH GFR - Abnormal; Notable for the following components:      Result Value   Glucose, Bld 126 (*)    Creatinine, Ser 1.10 (*)    GFR, Estimated 51 (*)    All other components within normal limits  LIPASE, BLOOD  CBC WITH DIFFERENTIAL/PLATELET  URINALYSIS, W/ REFLEX TO CULTURE (INFECTION SUSPECTED)                                                                                                                          Radiology DG Chest Portable 1 View Result Date: 02/01/2024 CLINICAL DATA:  Shortness of breath. EXAM: PORTABLE CHEST 1 VIEW COMPARISON:  05/23/2022. FINDINGS: The heart size and mediastinal contours are within normal limits. Minimal bibasilar linear atelectasis/scarring. No focal consolidation, pleural effusion, or pneumothorax. No acute osseous abnormality. IMPRESSION: Minimal bibasilar linear atelectasis/scarring. Otherwise, no acute cardiopulmonary findings. Electronically Signed   By: Harrietta Sherry M.D.   On: 02/01/2024 15:05    Pertinent labs & imaging results that were available during my care of the patient were reviewed by me and considered in my medical decision making (see MDM for details).  Medications Ordered in ED Medications  sodium chloride  0.9 % bolus 1,000 mL (1,000 mLs Intravenous New Bag/Given 02/01/24 1549)                                                                                                                                      Procedures Procedures  (including critical care time)  Medical Decision Making / ED Course   MDM:  78 year old presenting to the emergency department with lightheadedness.  Symptoms most concerning for orthostatic near syncope given that it occurred after standing associated with lightheadedness.  Was hypotensive in primary doctor office but here has been normotensive.  Will give fluids.  Given occurring on standing lower concern for other process such as cardiac cause, patient denies any loss of consciousness.  She does have sign of abrasion to left knee but otherwise no sign of acute injury.  She states she did not hit her head but she does have a headache so we will obtain CT head given age.  Patient also reports some abdominal pain and upper abdominal tenderness is present on exam.  Differential includes pancreatitis, cholecystitis, appendicitis, perforation, volvulus, gastritis.  Will obtain CT abdomen to further evaluate as well as labs.  Clinical Course as of 02/01/24 1626  Tue Feb 01, 2024  1625 Signed out to Dr. Emil pending CT scans, labs and re-assessment [WS]    Clinical Course User Index [WS] Francesca Elsie CROME, MD     Additional history obtained: -Additional history obtained from ems -External records from outside source obtained and reviewed including: Chart review including previous notes, labs, imaging, consultation notes including PMD note today   Lab Tests: -I ordered, reviewed, and interpreted labs.   The pertinent results include:   Labs Reviewed  COMPREHENSIVE METABOLIC PANEL WITH GFR - Abnormal; Notable for the following components:      Result Value   Glucose, Bld 126 (*)    Creatinine, Ser 1.10 (*)    GFR, Estimated 51 (*)    All other components within normal limits  LIPASE, BLOOD  CBC WITH DIFFERENTIAL/PLATELET  URINALYSIS, W/ REFLEX TO CULTURE (INFECTION SUSPECTED)    Notable for pending at sign out   EKG   EKG  Interpretation Date/Time:  Tuesday February 01 2024 14:36:08 EDT Ventricular Rate:  70 PR Interval:  181 QRS Duration:  108 QT Interval:  426 QTC Calculation: 460 R Axis:   -20  Text Interpretation: Sinus rhythm Left ventricular hypertrophy Anterior Q waves, possibly due to LVH No significant change since last tracing Confirmed by Emil Share [phone]) on 02/01/2024 4:21:29 PM         Imaging Studies ordered: I ordered imaging studies including CXR On my interpretation imaging demonstrates no acute process I independently visualized and interpreted imaging. I agree with the radiologist interpretation   Medicines ordered and prescription drug management: Meds ordered this encounter  Medications   sodium chloride  0.9 % bolus 1,000 mL    -I have reviewed the patients home medicines and have made adjustments as needed    Reevaluation: After the interventions noted above, I reevaluated the patient and found that their symptoms have improved  Co morbidities that complicate the patient evaluation  Past Medical History:  Diagnosis Date   Bone cancer (HCC) 2018   started chemo pill in  2018   Diabetes mellitus without complication (HCC)    Hypertension    Vocal cord cyst       Dispostion: Disposition decision including need for hospitalization was considered, and patient disposition pending at time of sign out.    Final Clinical Impression(s) / ED Diagnoses Final diagnoses:  Orthostatic lightheadedness  Abdominal cramping     This chart was dictated using voice recognition software.  Despite best efforts to proofread,  errors can occur which can change the documentation meaning.    Francesca Elsie CROME, MD 02/01/24 [phone]

## 2024-02-03 ENCOUNTER — Other Ambulatory Visit: Payer: Self-pay | Admitting: Hematology

## 2024-02-03 DIAGNOSIS — C9001 Multiple myeloma in remission: Secondary | ICD-10-CM

## 2024-02-09 DIAGNOSIS — N289 Disorder of kidney and ureter, unspecified: Secondary | ICD-10-CM | POA: Diagnosis not present

## 2024-02-15 ENCOUNTER — Other Ambulatory Visit: Payer: Self-pay

## 2024-02-15 DIAGNOSIS — C9001 Multiple myeloma in remission: Secondary | ICD-10-CM

## 2024-02-16 ENCOUNTER — Inpatient Hospital Stay

## 2024-02-16 ENCOUNTER — Inpatient Hospital Stay: Admitting: Hematology

## 2024-02-16 ENCOUNTER — Inpatient Hospital Stay: Attending: Hematology

## 2024-02-16 VITALS — BP 124/79 | HR 76 | Temp 97.7°F | Resp 18 | Wt 154.6 lb

## 2024-02-16 DIAGNOSIS — C9001 Multiple myeloma in remission: Secondary | ICD-10-CM

## 2024-02-16 DIAGNOSIS — C9000 Multiple myeloma not having achieved remission: Secondary | ICD-10-CM | POA: Diagnosis not present

## 2024-02-16 LAB — CMP (CANCER CENTER ONLY)
ALT: 36 U/L (ref 0–44)
AST: 20 U/L (ref 15–41)
Albumin: 3.6 g/dL (ref 3.5–5.0)
Alkaline Phosphatase: 85 U/L (ref 38–126)
Anion gap: 6 (ref 5–15)
BUN: 15 mg/dL (ref 8–23)
CO2: 23 mmol/L (ref 22–32)
Calcium: 9.4 mg/dL (ref 8.9–10.3)
Chloride: 110 mmol/L (ref 98–111)
Creatinine: 0.69 mg/dL (ref 0.44–1.00)
GFR, Estimated: >60 mL/min (ref >60)
Glucose, Bld: 120 mg/dL — ABNORMAL HIGH (ref 70–99)
Potassium: 4.4 mmol/L (ref 3.5–5.1)
Sodium: 139 mmol/L (ref 135–145)
Total Bilirubin: 0.3 mg/dL (ref 0.0–1.2)
Total Protein: 6.7 g/dL (ref 6.5–8.1)

## 2024-02-16 LAB — CBC WITH DIFFERENTIAL (CANCER CENTER ONLY)
Abs Immature Granulocytes: 0.01 K/uL (ref 0.00–0.07)
Basophils Absolute: 0.1 K/uL (ref 0.0–0.1)
Basophils Relative: 3 %
Eosinophils Absolute: 0.1 K/uL (ref 0.0–0.5)
Eosinophils Relative: 3 %
HCT: 33.8 % — ABNORMAL LOW (ref 36.0–46.0)
Hemoglobin: 11.2 g/dL — ABNORMAL LOW (ref 12.0–15.0)
Immature Granulocytes: 0 %
Lymphocytes Relative: 25 %
Lymphs Abs: 1 K/uL (ref 0.7–4.0)
MCH: 32 pg (ref 26.0–34.0)
MCHC: 33.1 g/dL (ref 30.0–36.0)
MCV: 96.6 fL (ref 80.0–100.0)
Monocytes Absolute: 0.5 K/uL (ref 0.1–1.0)
Monocytes Relative: 12 %
Neutro Abs: 2.3 K/uL (ref 1.7–7.7)
Neutrophils Relative %: 57 %
Platelet Count: 317 K/uL (ref 150–400)
RBC: 3.5 MIL/uL — ABNORMAL LOW (ref 3.87–5.11)
RDW: 14.6 % (ref 11.5–15.5)
WBC Count: 4 K/uL (ref 4.0–10.5)
nRBC: 0 % (ref 0.0–0.2)

## 2024-02-19 LAB — MULTIPLE MYELOMA PANEL, SERUM
Albumin SerPl Elph-Mcnc: 3 g/dL (ref 2.9–4.4)
Albumin/Glob SerPl: 1 (ref 0.7–1.7)
Alpha 1: 0.3 g/dL (ref 0.0–0.4)
Alpha2 Glob SerPl Elph-Mcnc: 1.2 g/dL — ABNORMAL HIGH (ref 0.4–1.0)
B-Globulin SerPl Elph-Mcnc: 0.8 g/dL (ref 0.7–1.3)
Gamma Glob SerPl Elph-Mcnc: 1 g/dL (ref 0.4–1.8)
Globulin, Total: 3.3 g/dL (ref 2.2–3.9)
IgA: 169 mg/dL (ref 64–422)
IgG (Immunoglobin G), Serum: 999 mg/dL (ref 586–1602)
IgM (Immunoglobulin M), Srm: 113 mg/dL (ref 26–217)
Total Protein ELP: 6.3 g/dL (ref 6.0–8.5)

## 2024-02-22 NOTE — Progress Notes (Signed)
 HEMATOLOGY/ONCOLOGY CLINIC NOTE  DOS: .02/16/2024 Patient Care Team: Teresa Channel, MD as PCP - General (Family Medicine) Onesimo Emaline Brink, MD as Consulting Physician (Hematology)  CHIEF COMPLAINTS/PURPOSE OF VISIT:  Follow-up for continued evaluation management of multiple myeloma  HISTORY OF PRESENTING ILLNESS: Please see previous note  INTERVAL HISTORY  Tracy Fisher is a wonderful 78 y.o. female who is here for continued evaluation and management of her multiple myeloma. She notes no systemic symptoms since her last clinic visit. No fevers no chills no night sweats.  No new focal bone pains. 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 Does have some mild intermittent grade 1 diarrhea from her Revlimid .  No muscle cramps or rashes. Overall tolerating her Revlimid  very well. She does note that she had a stomach bug with significant diarrhea recently and got lightheaded and dizzy and was having abdominal cramping. Had a fall and bumped her head.  CT of the head on 02/01/2024 showed no acute intracranial abnormalities. CT abdomen and pelvis with contrast done on 02/01/2024 showed concerns of possible mild colitis and she was sent home on antibiotics. All of her GI symptoms and lightheadedness have completely resolved..  Oncology History  Smoldering multiple myeloma (SMM)  Multiple myeloma (HCC)  08/28/2020 Initial Diagnosis   Multiple myeloma (HCC)   09/09/2020 - 10/07/2020 Chemotherapy         01/20/2021 - 07/07/2021 Chemotherapy   Patient is on Treatment Plan : MYELOMA NEWLY DIAGNOSED TRANSPLANT CANDIDATE DaraVRd (Daratumumab  IV) q21d x 6 Cycles (Induction/Consolidation)       MEDICAL HISTORY:  Past Medical History:  Diagnosis Date   Bone cancer (HCC) 2018   started chemo pill in  2018   Diabetes mellitus without complication (HCC)    Hypertension    Vocal cord cyst     SURGICAL HISTORY: Past Surgical History:  Procedure  Laterality Date   NO PAST SURGERIES      SOCIAL HISTORY: Social History   Socioeconomic History   Marital status: Divorced    Spouse name: Not on file   Number of children: Not on file   Years of education: Not on file   Highest education level: Not on file  Occupational History   Not on file  Tobacco Use   Smoking status: Never   Smokeless tobacco: Never  Vaping Use   Vaping status: Never Used  Substance and Sexual Activity   Alcohol use: No   Drug use: Never   Sexual activity: Not on file  Other Topics Concern   Not on file  Social History Narrative   Not on file   Social Drivers of Health   Financial Resource Strain: Not on file  Food Insecurity: Not on file  Transportation Needs: Not on file  Physical Activity: Not on file  Stress: Not on file  Social Connections: Not on file  Intimate Partner Violence: Not on file    FAMILY HISTORY: Family History  Problem Relation Age of Onset   Breast cancer Neg Hx    Neuropathy Neg Hx     ALLERGIES:  is allergic to bortezomib , sulfa antibiotics, and sulfamethoxazole.  MEDICATIONS:  Current Outpatient Medications  Medication Sig Dispense Refill   acyclovir  (ZOVIRAX ) 400 MG tablet Take 1 tablet by mouth twice daily 60 tablet 0   amLODipine  (NORVASC ) 5 MG tablet      amoxicillin -clavulanate (AUGMENTIN ) 875-125 MG tablet Take 1 tablet by mouth every 12 (twelve) hours. 14 tablet  0   aspirin EC 81 MG tablet Take 81 mg by mouth daily. Swallow whole.     atorvastatin  (LIPITOR) 20 MG tablet      b complex vitamins capsule Take 1 capsule by mouth daily. 30 capsule 11   brimonidine  (ALPHAGAN ) 0.2 % ophthalmic solution Place 1 drop into both eyes 2 (two) times daily.   3   Calcium Carbonate-Vitamin D  (CALCIUM + D PO) Take 1 tablet by mouth daily.     dorzolamide -timolol  (COSOPT ) 22.3-6.8 MG/ML ophthalmic solution Place 1 drop into both eyes 2 (two) times daily.   12   DULoxetine  (CYMBALTA ) 60 MG capsule Take 1 capsule by mouth  once daily 30 capsule 0   gabapentin  (NEURONTIN ) 300 MG capsule TAKE 1 CAPSULE BY MOUTH THREE TIMES DAILY 90 capsule 0   lenalidomide  (REVLIMID ) 10 MG capsule Take 1 capsule (10 mg total) by mouth daily for 21 days. Take 1 capsule (10 mg total) by mouth daily for 21 days and then take 7 days off. 21 capsule 0   LUMIGAN  0.01 % SOLN INSTILL 1 DROP INTO EACH EYE ONCE DAILY     methylPREDNISolone  (MEDROL  DOSEPAK) 4 MG TBPK tablet Take as directed on the box 21 tablet 0   Multiple Vitamin (MULTIVITAMIN) capsule Take by mouth.     ondansetron  (ZOFRAN -ODT) 4 MG disintegrating tablet 4mg  ODT q4 hours prn nausea/vomit 20 tablet 0   ONETOUCH VERIO test strip USE STRIP TO CHECK GLUCOSE TWICE DAILY     Potassium Chloride  ER 20 MEQ TBCR Take 1 tablet by mouth twice daily 60 tablet 0   potassium chloride  SA (KLOR-CON  M) 20 MEQ tablet Take 1 tablet by mouth twice daily 60 tablet 0   No current facility-administered medications for this visit.    REVIEW OF SYSTEMS:    .10 Point review of Systems was done is negative except as noted above.   PHYSICAL EXAMINATION: ECOG PERFORMANCE STATUS: 1 - Symptomatic but completely ambulatory  Vitals:   02/16/24 1241  BP: 124/79  Pulse: 76  Resp: 18  Temp: 97.7 F (36.5 C)  SpO2: 96%   Filed Weights   02/16/24 1241  Weight: 154 lb 9.6 oz (70.1 kg)   . GENERAL:alert, in no acute distress and comfortable SKIN: no acute rashes, no significant lesions EYES: conjunctiva are pink and non-injected, sclera anicteric OROPHARYNX: MMM, no exudates, no oropharyngeal erythema or ulceration NECK: supple, no JVD LYMPH:  no palpable lymphadenopathy in the cervical, axillary or inguinal regions LUNGS: clear to auscultation b/l with normal respiratory effort HEART: regular rate & rhythm ABDOMEN:  normoactive bowel sounds , non tender, not distended. Extremity: no pedal edema PSYCH: alert & oriented x 3 with fluent speech NEURO: no focal motor/sensory  deficits    LABORATORY DATA:      Latest Ref Rng & Units 02/16/2024   12:21 PM 02/01/2024    2:32 PM 11/24/2023    1:57 PM  CBC  WBC 4.0 - 10.5 K/uL 4.0  7.7  2.7   Hemoglobin 12.0 - 15.0 g/dL 88.7  87.2  87.8   Hematocrit 36.0 - 46.0 % 33.8  39.0  34.3   Platelets 150 - 400 K/uL 317  143  163    .    Latest Ref Rng & Units 02/16/2024   12:21 PM 02/01/2024    2:32 PM 11/24/2023    1:57 PM  CMP  Glucose 70 - 99 mg/dL 879  873  886   BUN 8 - 23 mg/dL  15  16  10    Creatinine 0.44 - 1.00 mg/dL 9.30  8.89  9.26   Sodium 135 - 145 mmol/L 139  135  141   Potassium 3.5 - 5.1 mmol/L 4.4  3.8  3.5   Chloride 98 - 111 mmol/L 110  101  110   CO2 22 - 32 mmol/L 23  24  24    Calcium 8.9 - 10.3 mg/dL 9.4  9.4  9.2   Total Protein 6.5 - 8.1 g/dL 6.7  7.6  6.7   Total Bilirubin 0.0 - 1.2 mg/dL 0.3  1.0  0.4   Alkaline Phos 38 - 126 U/L 85  61  51   AST 15 - 41 U/L 20  33  23   ALT 0 - 44 U/L 36  33  31     RADIOGRAPHIC STUDIES: I have personally reviewed the radiological images as listed and agreed with the findings in the report. CT Head Wo Contrast Result Date: 02/01/2024 CLINICAL DATA:  Headache weakness fall yesterday EXAM: CT HEAD WITHOUT CONTRAST TECHNIQUE: Contiguous axial images were obtained from the base of the skull through the vertex without intravenous contrast. RADIATION DOSE REDUCTION: This exam was performed according to the departmental dose-optimization program which includes automated exposure control, adjustment of the mA and/or kV according to patient size and/or use of iterative reconstruction technique. COMPARISON:  CT brain 06/04/2022 FINDINGS: Brain: No acute territorial infarction, hemorrhage or intracranial mass. The ventricles are nonenlarged. Mild atrophy. Vascular: No hyperdense vessels.  Carotid vascular calcification Skull: Normal. Negative for fracture or focal lesion. Sinuses/Orbits: No acute finding. Other: None IMPRESSION: Negative.  No CT evidence for acute  intracranial abnormality. Electronically Signed   By: Luke Bun M.D.   On: 02/01/2024 17:56   CT ABDOMEN PELVIS W CONTRAST Result Date: 02/01/2024 CLINICAL DATA:  Abdominal pain and cramping.  Recent fall. EXAM: CT ABDOMEN AND PELVIS WITH CONTRAST TECHNIQUE: Multidetector CT imaging of the abdomen and pelvis was performed using the standard protocol following bolus administration of intravenous contrast. RADIATION DOSE REDUCTION: This exam was performed according to the departmental dose-optimization program which includes automated exposure control, adjustment of the mA and/or kV according to patient size and/or use of iterative reconstruction technique. CONTRAST:  OMNIPAQUE  IOHEXOL  300 MG/ML  SOLN COMPARISON:  Remote CT 11/27/2013 FINDINGS: Lower chest: Small right pleural effusion and adjacent atelectasis. Hepatobiliary: Multiple cysts scattered throughout the liver. No solid liver lesion. Punctate granuloma in the right lobe of the liver. Gallbladder physiologically distended, no calcified stone. No biliary dilatation. Pancreas: No ductal dilatation or inflammation. Spleen: Normal in size without focal abnormality. Adrenals/Urinary Tract: No adrenal nodule. No hydronephrosis or perinephric edema. Right ureteral prominence is chronic and of doubtful clinical significance. Homogeneous renal enhancement with symmetric excretion on delayed phase imaging. No renal calculi or suspicious renal lesion. Urinary bladder is moderately distended without wall thickening. Stomach/Bowel: Small hiatal hernia. No small bowel obstruction or inflammation. Scattered liquid stool throughout the colon question of mild hyperemia involving the descending colon. No pericolonic fat stranding. Normal appendix. Vascular/Lymphatic: Aortic and branch atherosclerosis. No aortic aneurysm. The portal vein is patent. No abdominopelvic adenopathy. Reproductive: Status post hysterectomy. No adnexal masses. Other: No ascites or free  air. Small fat containing umbilical hernia. Musculoskeletal: Lower lumbar facet hypertrophy. There are no acute or suspicious osseous abnormalities. IMPRESSION: 1. Scattered liquid stool throughout the colon with question of mild hyperemia involving the descending colon, can be seen with diarrheal illness or mild colitis. 2.  Small right pleural effusion and adjacent atelectasis. Aortic Atherosclerosis (ICD10-I70.0). Electronically Signed   By: Andrea Gasman M.D.   On: 02/01/2024 17:34   DG Chest Portable 1 View Result Date: 02/01/2024 CLINICAL DATA:  Shortness of breath. EXAM: PORTABLE CHEST 1 VIEW COMPARISON:  05/23/2022. FINDINGS: The heart size and mediastinal contours are within normal limits. Minimal bibasilar linear atelectasis/scarring. No focal consolidation, pleural effusion, or pneumothorax. No acute osseous abnormality. IMPRESSION: Minimal bibasilar linear atelectasis/scarring. Otherwise, no acute cardiopulmonary findings. Electronically Signed   By: Harrietta Sherry M.D.   On: 02/01/2024 15:05    ASSESSMENT & PLAN:   78 y.o. female here for follow-up of her multiple myeloma and next treatment   #1 IgG kappa multiple myeloma.   Initially presented as smoldering myeloma and had M spike of 1.9 g/dL which was noted to be IgG kappa on immunofixation electrophoresis. -Bone survey shows no overt evidence of osseous involvement suggestive of multiple myeloma. Beta 2 microglobulin, sedimentation rate an LDH levels are within normal limits. -Bone marrow biopsy shows previously showed 17% monoclonal plasma cells Cytogenetics/MolCy - trisomy 11 (consistent with myeloma)   -Bone study from 09/27/17 showed some osteopenic changes without osteoporosis. Previous bone studies are not yet available  -09/08/2019 FISH Panel revealed No Mutations Detected. -09/08/2019 Cytogenetics revealed Normal female karyotype. -09/08/2019 BM Bx Report ([id number])  revealed BONE MARROW, ASPIRATE, CLOT, CORE:  -Hypercellular bone marrow for age with plasma cell neoplasm PERIPHERAL BLOOD: -Macrocytic anemia -Leukopenia. - 20% Plasma Cells  -09/15/2019 PET/CT ([id number]) revealed 1. No findings of active myeloma or other hypermetabolic malignancy. 2.  Aortic Atherosclerosis (ICD10-I70.0). 3. Photopenic hypodense hepatic lesions most compatible with cysts.   A treatment initiation active Multiple myeloma Bone Marrow Biopsy with nearly 60% plasma cells. With M spike of 3.6g/dl Starting to develop mild anemia (does not meet anemia criteria yet) No hypercalcemia, renal insufficiency  PET/CT 07/31/2020 - No abnormal hypermetabolism in the neck, chest, abdomen or pelvis.  -PET CT done 07/18/2021 - No hypermetabolic bone lesions suggestive of multiple myeloma.   #2  Chronic leukopenia with intermittent neutropenia. -B12 and folate levels within normal limits. -Reasonable to take a daily vitamin B complex tablet. -No indication for G-CSF at this time. -Now also additionally possibly due to Revlimid .   #3.  Mild anemia and leukopenia likely related to myeloma and Revlimid .  #4 rh/o influenza A infection in February 2023 -Patient's influenza A URI has resolved.  She was given Augmentin  for secondary bacterial infection by her primary care physician.  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  Status post antibiotics.  Resolved symptoms.  PLAN:  Patient's lab results from today 02/16/2024 were discussed in details  CBC shows hemoglobin of 11.2 with normal wbc counts of 4k and normal platelets of 317k CMP is within normal limits Myeloma panel shows no measurable M spike Patient notes no new symptoms suggestive of myeloma progression at this time. She recent had acute gastroenteritis with some dehydration which is now resolved She will continue her maintenance  Revlimid  at 10 mg 3 weeks on 1 week off She has completed 2 years of maintenance Zometa  and we shall discontinue this at this time..  FOLLOW UP: Zometa  completed 2 yrs of maintenance -- orders discontinued RTC with Dr Onesimo with labs in 3 months   The total time spent in the appointment was 30 minutes*.  All of the patient's questions were answered with apparent satisfaction. The patient knows to call the clinic with any problems, questions or concerns.   Emaline Saran MD MS AAHIVMS [facility] Hematology/Oncology Physician [facility]  .*Total Encounter Time as defined by the Centers for Medicare and Medicaid Services includes, in addition to the face-to-face time of a patient visit (documented in the note above) non-face-to-face time: obtaining and reviewing outside history, ordering and reviewing medications, tests or procedures, care coordination (communications with other health care professionals or caregivers) and documentation in the medical record.

## 2024-02-24 ENCOUNTER — Other Ambulatory Visit: Payer: Self-pay | Admitting: Hematology

## 2024-02-24 DIAGNOSIS — C9000 Multiple myeloma not having achieved remission: Secondary | ICD-10-CM

## 2024-03-02 ENCOUNTER — Other Ambulatory Visit: Payer: Self-pay | Admitting: Hematology

## 2024-03-02 DIAGNOSIS — B353 Tinea pedis: Secondary | ICD-10-CM | POA: Diagnosis not present

## 2024-03-02 DIAGNOSIS — C9001 Multiple myeloma in remission: Secondary | ICD-10-CM

## 2024-03-03 ENCOUNTER — Other Ambulatory Visit: Payer: Self-pay | Admitting: Hematology

## 2024-03-03 DIAGNOSIS — C9000 Multiple myeloma not having achieved remission: Secondary | ICD-10-CM

## 2024-03-06 ENCOUNTER — Other Ambulatory Visit: Payer: Self-pay

## 2024-03-06 DIAGNOSIS — C9000 Multiple myeloma not having achieved remission: Secondary | ICD-10-CM

## 2024-03-06 MED ORDER — POTASSIUM CHLORIDE CRYS ER 20 MEQ PO TBCR: 20.0000 meq | EXTENDED_RELEASE_TABLET | Freq: Two times a day (BID) | ORAL | 3 refills | Status: AC

## 2024-03-07 ENCOUNTER — Other Ambulatory Visit: Payer: Self-pay

## 2024-03-15 ENCOUNTER — Ambulatory Visit
Admission: RE | Admit: 2024-03-15 | Discharge: 2024-03-15 | Disposition: A | Discharge status: Home / Self Care | Payer: Medicare HMO | Source: Ambulatory Visit | Attending: Family Medicine | Admitting: Family Medicine

## 2024-03-15 ENCOUNTER — Other Ambulatory Visit: Payer: Medicare HMO

## 2024-03-15 DIAGNOSIS — Z1231 Encounter for screening mammogram for malignant neoplasm of breast: Secondary | ICD-10-CM | POA: Diagnosis not present

## 2024-03-15 DIAGNOSIS — Z0000 Encounter for general adult medical examination without abnormal findings: Secondary | ICD-10-CM

## 2024-03-19 DIAGNOSIS — M25551 Pain in right hip: Secondary | ICD-10-CM | POA: Diagnosis not present

## 2024-03-22 DIAGNOSIS — H401111 Primary open-angle glaucoma, right eye, mild stage: Secondary | ICD-10-CM | POA: Diagnosis not present

## 2024-03-22 DIAGNOSIS — H401123 Primary open-angle glaucoma, left eye, severe stage: Secondary | ICD-10-CM | POA: Diagnosis not present

## 2024-03-22 DIAGNOSIS — Z961 Presence of intraocular lens: Secondary | ICD-10-CM | POA: Diagnosis not present

## 2024-03-23 DIAGNOSIS — B353 Tinea pedis: Secondary | ICD-10-CM | POA: Diagnosis not present

## 2024-03-24 ENCOUNTER — Other Ambulatory Visit: Payer: Self-pay | Admitting: Hematology

## 2024-03-24 DIAGNOSIS — C9000 Multiple myeloma not having achieved remission: Secondary | ICD-10-CM

## 2024-03-30 ENCOUNTER — Other Ambulatory Visit: Payer: Self-pay | Admitting: Hematology

## 2024-03-30 DIAGNOSIS — C9001 Multiple myeloma in remission: Secondary | ICD-10-CM

## 2024-04-06 ENCOUNTER — Other Ambulatory Visit: Payer: Self-pay

## 2024-04-06 DIAGNOSIS — C9000 Multiple myeloma not having achieved remission: Secondary | ICD-10-CM

## 2024-04-06 MED ORDER — ACYCLOVIR 400 MG PO TABS: 400.0000 mg | ORAL_TABLET | Freq: Two times a day (BID) | ORAL | 0 refills | Status: DC

## 2024-04-07 DIAGNOSIS — M170 Bilateral primary osteoarthritis of knee: Secondary | ICD-10-CM | POA: Diagnosis not present

## 2024-04-19 ENCOUNTER — Ambulatory Visit (HOSPITAL_BASED_OUTPATIENT_CLINIC_OR_DEPARTMENT_OTHER)
Admission: RE | Admit: 2024-04-19 | Discharge: 2024-04-19 | Disposition: A | Discharge status: Home / Self Care | Source: Ambulatory Visit | Attending: Family Medicine | Admitting: Family Medicine

## 2024-04-19 DIAGNOSIS — Z780 Asymptomatic menopausal state: Secondary | ICD-10-CM | POA: Diagnosis not present

## 2024-04-19 DIAGNOSIS — M8580 Other specified disorders of bone density and structure, unspecified site: Secondary | ICD-10-CM | POA: Insufficient documentation

## 2024-04-23 DIAGNOSIS — J069 Acute upper respiratory infection, unspecified: Secondary | ICD-10-CM | POA: Diagnosis not present

## 2024-04-24 ENCOUNTER — Encounter: Payer: Self-pay | Admitting: Podiatry

## 2024-04-24 ENCOUNTER — Other Ambulatory Visit: Payer: Self-pay | Admitting: Hematology

## 2024-04-24 ENCOUNTER — Ambulatory Visit: Admitting: Podiatry

## 2024-04-24 DIAGNOSIS — M79675 Pain in left toe(s): Secondary | ICD-10-CM | POA: Diagnosis not present

## 2024-04-24 DIAGNOSIS — M79674 Pain in right toe(s): Secondary | ICD-10-CM | POA: Diagnosis not present

## 2024-04-24 DIAGNOSIS — B351 Tinea unguium: Secondary | ICD-10-CM | POA: Diagnosis not present

## 2024-04-24 DIAGNOSIS — C9001 Multiple myeloma in remission: Secondary | ICD-10-CM

## 2024-04-24 NOTE — Progress Notes (Signed)
    Subjective:  Patient ID: Tracy Fisher, female    DOB: 1945-06-30,  MRN: [id number]  Tracy Fisher presents to clinic today for:  Chief Complaint  Patient presents with   Diabetes    Diet control diabetes. [facility] Toenail trim.    Patient notes nails are thick, discolored, elongated and painful in shoegear when trying to ambulate.    PCP is Tracy Channel, MD.  Past Medical History:  Diagnosis Date   Bone cancer (HCC) 2018   started chemo pill in  2018   Diabetes mellitus without complication (HCC)    Hypertension    Vocal cord cyst    Past Surgical History:  Procedure Laterality Date   NO PAST SURGERIES     Allergies  Allergen Reactions   Bortezomib  Other (See Comments)    paresthesias to bilateral hands and feet.  Soreness/neuropathy  Other Reaction(s): neuropathy   Sulfa Antibiotics Itching and Other (See Comments)   Sulfamethoxazole     Other Reaction(s): itching/ break out    Review of Systems: Negative except as noted in the HPI.  Objective:  Tracy Fisher is a pleasant 78 y.o. female in NAD. AAO x 3.  Vascular Examination: Capillary refill time is 3-5 seconds to toes bilateral. Palpable pedal pulses b/l LE. Digital hair present b/l.  Skin temperature gradient WNL b/l. No varicosities b/l. No cyanosis noted b/l.   Dermatological Examination: Pedal skin with normal turgor, texture and tone b/l. No open wounds. No interdigital macerations b/l. Toenails x10 are 3mm thick, discolored, dystrophic with subungual debris. There is pain with compression of the nail plates.  They are elongated x10  Assessment/Plan: 1. Pain due to onychomycosis of toenails of both feet     The mycotic toenails were sharply debrided x10 with sterile nail nippers and a power debriding burr to decrease bulk/thickness and length.    Return in about 3 months (around 07/25/2024) for [facility].   Awanda CHARM [place], DPM, FACFAS [facility]     [address] [place], [place] [address]                Office [phone]  Fax [phone]

## 2024-04-26 ENCOUNTER — Other Ambulatory Visit: Payer: Self-pay | Admitting: Hematology

## 2024-04-26 DIAGNOSIS — C9000 Multiple myeloma not having achieved remission: Secondary | ICD-10-CM

## 2024-05-08 ENCOUNTER — Other Ambulatory Visit: Payer: Self-pay | Admitting: Hematology

## 2024-05-08 DIAGNOSIS — C9000 Multiple myeloma not having achieved remission: Secondary | ICD-10-CM

## 2024-05-10 ENCOUNTER — Other Ambulatory Visit: Payer: Self-pay | Admitting: Hematology

## 2024-05-10 DIAGNOSIS — C9000 Multiple myeloma not having achieved remission: Secondary | ICD-10-CM

## 2024-05-11 ENCOUNTER — Other Ambulatory Visit: Payer: Self-pay | Admitting: Hematology

## 2024-05-11 DIAGNOSIS — C9000 Multiple myeloma not having achieved remission: Secondary | ICD-10-CM

## 2024-05-16 ENCOUNTER — Other Ambulatory Visit: Payer: Self-pay

## 2024-05-16 DIAGNOSIS — C9000 Multiple myeloma not having achieved remission: Secondary | ICD-10-CM

## 2024-05-17 ENCOUNTER — Ambulatory Visit (HOSPITAL_COMMUNITY)
Admission: RE | Admit: 2024-05-17 | Discharge: 2024-05-17 | Disposition: A | Discharge status: Home / Self Care | Source: Ambulatory Visit | Attending: Hematology | Admitting: Hematology

## 2024-05-17 ENCOUNTER — Inpatient Hospital Stay: Admitting: Hematology

## 2024-05-17 ENCOUNTER — Inpatient Hospital Stay: Attending: Hematology

## 2024-05-17 VITALS — BP 120/75 | HR 83 | Temp 97.3°F | Resp 20 | Wt 160.2 lb

## 2024-05-17 DIAGNOSIS — M160 Bilateral primary osteoarthritis of hip: Secondary | ICD-10-CM | POA: Diagnosis not present

## 2024-05-17 DIAGNOSIS — C9000 Multiple myeloma not having achieved remission: Secondary | ICD-10-CM | POA: Diagnosis not present

## 2024-05-17 DIAGNOSIS — Z79624 Long term (current) use of inhibitors of nucleotide synthesis: Secondary | ICD-10-CM | POA: Insufficient documentation

## 2024-05-17 DIAGNOSIS — M47816 Spondylosis without myelopathy or radiculopathy, lumbar region: Secondary | ICD-10-CM | POA: Diagnosis not present

## 2024-05-17 DIAGNOSIS — Z79899 Other long term (current) drug therapy: Secondary | ICD-10-CM | POA: Diagnosis not present

## 2024-05-17 DIAGNOSIS — Z7982 Long term (current) use of aspirin: Secondary | ICD-10-CM | POA: Insufficient documentation

## 2024-05-17 DIAGNOSIS — M533 Sacrococcygeal disorders, not elsewhere classified: Secondary | ICD-10-CM | POA: Insufficient documentation

## 2024-05-17 DIAGNOSIS — Z7961 Long term (current) use of immunomodulator: Secondary | ICD-10-CM | POA: Insufficient documentation

## 2024-05-17 DIAGNOSIS — C9001 Multiple myeloma in remission: Secondary | ICD-10-CM

## 2024-05-17 DIAGNOSIS — D709 Neutropenia, unspecified: Secondary | ICD-10-CM | POA: Diagnosis not present

## 2024-05-17 DIAGNOSIS — M25552 Pain in left hip: Secondary | ICD-10-CM | POA: Diagnosis not present

## 2024-05-17 LAB — CBC WITH DIFFERENTIAL (CANCER CENTER ONLY)
Abs Immature Granulocytes: 0 K/uL (ref 0.00–0.07)
Basophils Absolute: 0.1 K/uL (ref 0.0–0.1)
Basophils Relative: 2 %
Eosinophils Absolute: 0.1 K/uL (ref 0.0–0.5)
Eosinophils Relative: 5 %
HCT: 36.6 % (ref 36.0–46.0)
Hemoglobin: 12.2 g/dL (ref 12.0–15.0)
Immature Granulocytes: 0 %
Lymphocytes Relative: 37 %
Lymphs Abs: 1.1 K/uL (ref 0.7–4.0)
MCH: 31.8 pg (ref 26.0–34.0)
MCHC: 33.3 g/dL (ref 30.0–36.0)
MCV: 95.3 fL (ref 80.0–100.0)
Monocytes Absolute: 0.2 K/uL (ref 0.1–1.0)
Monocytes Relative: 8 %
Neutro Abs: 1.4 K/uL — ABNORMAL LOW (ref 1.7–7.7)
Neutrophils Relative %: 48 %
Platelet Count: 195 K/uL (ref 150–400)
RBC: 3.84 MIL/uL — ABNORMAL LOW (ref 3.87–5.11)
RDW: 16.1 % — ABNORMAL HIGH (ref 11.5–15.5)
WBC Count: 2.9 K/uL — ABNORMAL LOW (ref 4.0–10.5)
nRBC: 0 % (ref 0.0–0.2)

## 2024-05-17 LAB — CMP (CANCER CENTER ONLY)
ALT: 25 U/L (ref 0–44)
AST: 22 U/L (ref 15–41)
Albumin: 4.2 g/dL (ref 3.5–5.0)
Alkaline Phosphatase: 59 U/L (ref 38–126)
Anion gap: 10 (ref 5–15)
BUN: 18 mg/dL (ref 8–23)
CO2: 25 mmol/L (ref 22–32)
Calcium: 9.8 mg/dL (ref 8.9–10.3)
Chloride: 105 mmol/L (ref 98–111)
Creatinine: 0.81 mg/dL (ref 0.44–1.00)
GFR, Estimated: >60 mL/min (ref >60)
Glucose, Bld: 106 mg/dL — ABNORMAL HIGH (ref 70–99)
Potassium: 4.1 mmol/L (ref 3.5–5.1)
Sodium: 140 mmol/L (ref 135–145)
Total Bilirubin: 0.3 mg/dL (ref 0.0–1.2)
Total Protein: 7 g/dL (ref 6.5–8.1)

## 2024-05-17 MED ORDER — AZITHROMYCIN 250 MG PO TABS: ORAL_TABLET | ORAL | 0 refills | Status: AC

## 2024-05-17 NOTE — Progress Notes (Signed)
 HEMATOLOGY ONCOLOGY PROGRESS NOTE  Date of service: 05/17/2024  Patient Care Team: Teresa Channel, MD as PCP - General (Family Medicine) Onesimo Emaline Brink, MD as Consulting Physician (Hematology)  CHIEF COMPLAINT/PURPOSE OF CONSULTATION: Follow-up for continued evaluation and management of Multiple Myeloma  HISTORY OF PRESENTING ILLNESS: (12/01/2016) Tracy Fisher is a wonderful 77 y.o. female who has been referred to us  by Dr .Teresa Channel, MD for evaluation and management of elevated protein/monoclonal paraproteinemia.   The patient has a history of hypertension, dyslipidemia, irritable bowel syndrome, GERD, depression who on routine labs with her primary care physician on 10/26/2016 was noted to have slightly elevated total protein level of 8.6. This resulted in her getting an SPEP which was noted to have an M spike of 2.2 g/dL. No IFE available. As a result she was referred to us  for further evaluation of her monoclonal paraproteinemia to rule out a plasma cell dyscrasia. CBC on the same day showed a normal hemoglobin of 12.8 with an MCV of 99.9. Leukopenia of 2.9k with an ANC of 1.1k platelet count of 206k. CMP showed a normal creatinine of 0.71 and a normal corrected calcium level of 9.6 and normal liver function tests.   Patient notes no specific new focal bone pains. No acute new fatigue. No fevers no chills no night sweats. No reported unexpected weight loss She notes that she recently had a tick bite and was treated by her primary care physician emphatically with doxycycline which she recently completed.   Patient notes that she did have left-sided pneumonia in January this year.   SUMMARY OF ONCOLOGIC HISTORY: Oncology History  Smoldering multiple myeloma (SMM)  Multiple myeloma (HCC)  08/28/2020 Initial Diagnosis   Multiple myeloma (HCC)   09/09/2020 - 10/07/2020 Chemotherapy         01/20/2021 - 07/07/2021 Chemotherapy   Patient is on Treatment Plan : MYELOMA NEWLY  DIAGNOSED TRANSPLANT CANDIDATE DaraVRd (Daratumumab  IV) q21d x 6 Cycles (Induction/Consolidation)      INTERVAL HISTORY:  Tracy Fisher is a 78 y.o. female who is here today for continued evaluation and management of Multiple Myeloma.   she was last seen by me on 02/16/2024; at the time she mentioned experiencing grade I neuropathy and intermittent grade I diarrhea from Revlimid .   Today, she says that she has been well overall. She mentions experiencing left-sided lower back pain that requires her to sit down when it begins to hurt - has not discussed this with her PCP. This has not changed. Denies diffusion into legs.   She reports that when she received her Influenza and Covid-19, she experienced a mild reaction of congestion. She endorses aching in her ears and rhinorrhea.   She recalls when she was seen in the emergency department in August. She denies no diarrhea or fevers.  REVIEW OF SYSTEMS:   10 Point review of systems of done and is negative except as noted above.  MEDICAL HISTORY Past Medical History:  Diagnosis Date   Bone cancer (HCC) 2018   started chemo pill in  2018   Diabetes mellitus without complication (HCC)    Hypertension    Vocal cord cyst     SURGICAL HISTORY Past Surgical History:  Procedure Laterality Date   NO PAST SURGERIES      SOCIAL HISTORY Social History   Tobacco Use   Smoking status: Never   Smokeless tobacco: Never  Vaping Use   Vaping status: Never Used  Substance Use Topics   Alcohol use: No  Drug use: Never    Social History   Social History Narrative   Not on file    SOCIAL DRIVERS OF HEALTH SDOH Screenings   Depression [phone]): Low Risk  (02/16/2024)  Tobacco Use: Low Risk  (04/24/2024)     FAMILY HISTORY Family History  Problem Relation Age of Onset   Breast cancer Neg Hx    Neuropathy Neg Hx      ALLERGIES: is allergic to bortezomib , sulfa antibiotics, and sulfamethoxazole.  MEDICATIONS  Current  Outpatient Medications  Medication Sig Dispense Refill   acyclovir  (ZOVIRAX ) 400 MG tablet Take 1 tablet by mouth twice daily 60 tablet 0   amLODipine  (NORVASC ) 5 MG tablet      amoxicillin -clavulanate (AUGMENTIN ) 875-125 MG tablet Take 1 tablet by mouth every 12 (twelve) hours. (Patient not taking: Reported on 04/24/2024) 14 tablet 0   aspirin EC 81 MG tablet Take 81 mg by mouth daily. Swallow whole.     atorvastatin  (LIPITOR) 20 MG tablet      b complex vitamins capsule Take 1 capsule by mouth daily. 30 capsule 11   brimonidine  (ALPHAGAN ) 0.2 % ophthalmic solution Place 1 drop into both eyes 2 (two) times daily.   3   Calcium Carbonate-Vitamin D  (CALCIUM + D PO) Take 1 tablet by mouth daily.     dorzolamide -timolol  (COSOPT ) 22.3-6.8 MG/ML ophthalmic solution Place 1 drop into both eyes 2 (two) times daily.   12   DULoxetine  (CYMBALTA ) 60 MG capsule Take 1 capsule by mouth once daily 30 capsule 0   gabapentin  (NEURONTIN ) 300 MG capsule TAKE 1 CAPSULE BY MOUTH THREE TIMES DAILY 90 capsule 0   lenalidomide  (REVLIMID ) 10 MG capsule Take 1 capsule (10 mg total) by mouth daily. Take 1 capsule (10 mg total) by mouth daily for 21 days. Take 7 days off. Repeat cycle. 21 capsule 0   LUMIGAN  0.01 % SOLN INSTILL 1 DROP INTO EACH EYE ONCE DAILY     methylPREDNISolone  (MEDROL  DOSEPAK) 4 MG TBPK tablet Take as directed on the box (Patient not taking: Reported on 04/24/2024) 21 tablet 0   Multiple Vitamin (MULTIVITAMIN) capsule Take by mouth.     ondansetron  (ZOFRAN -ODT) 4 MG disintegrating tablet 4mg  ODT q4 hours prn nausea/vomit (Patient not taking: Reported on 04/24/2024) 20 tablet 0   ONETOUCH VERIO test strip USE STRIP TO CHECK GLUCOSE TWICE DAILY     Potassium Chloride  ER 20 MEQ TBCR Take 1 tablet by mouth twice daily 60 tablet 0   potassium chloride  SA (KLOR-CON  M) 20 MEQ tablet Take 1 tablet (20 mEq total) by mouth 2 (two) times daily. 60 tablet 3   No current facility-administered medications for  this visit.    PHYSICAL EXAMINATION: ECOG PERFORMANCE STATUS: 1 - Symptomatic but completely ambulatory VITALS: Vitals:   05/17/24 0931  BP: 120/75  Pulse: 83  Resp: 20  Temp: (!) 97.3 F (36.3 C)  SpO2: 96%   Filed Weights   05/17/24 0931  Weight: 160 lb 3.2 oz (72.7 kg)   Body mass index is 27.5 kg/m.  GENERAL: alert, in no acute distress and comfortable SKIN: no acute rashes, no significant lesions EYES: conjunctiva are pink and non-injected, sclera anicteric OROPHARYNX: MMM, no exudates, no oropharyngeal erythema or ulceration NECK: supple, no JVD LYMPH:  no palpable lymphadenopathy in the cervical, axillary or inguinal regions LUNGS: clear to auscultation b/l with normal respiratory effort HEART: regular rate & rhythm ABDOMEN:  normoactive bowel sounds , non tender, not distended, no hepatosplenomegaly Extremity:  no pedal edema PSYCH: alert & oriented x 3 with fluent speech NEURO: no focal motor/sensory deficits  LABORATORY DATA:   I have reviewed the data as listed     Latest Ref Rng & Units 05/17/2024    9:13 AM 02/16/2024   12:21 PM 02/01/2024    2:32 PM  CBC EXTENDED  WBC 4.0 - 10.5 K/uL 2.9  4.0  7.7   RBC 3.87 - 5.11 MIL/uL 3.84  3.50  3.93   Hemoglobin 12.0 - 15.0 g/dL 87.7  88.7  87.2   HCT 36.0 - 46.0 % 36.6  33.8  39.0   Platelets 150 - 400 K/uL 195  317  143   NEUT# 1.7 - 7.7 K/uL 1.4  2.3  5.6   Lymph# 0.7 - 4.0 K/uL 1.1  1.0  1.0       Latest Ref Rng & Units 05/17/2024    9:13 AM 02/16/2024   12:21 PM 02/01/2024    2:32 PM  CMP  Glucose 70 - 99 mg/dL 893  879  873   BUN 8 - 23 mg/dL 18  15  16    Creatinine 0.44 - 1.00 mg/dL 9.18  9.30  8.89   Sodium 135 - 145 mmol/L 140  139  135   Potassium 3.5 - 5.1 mmol/L 4.1  4.4  3.8   Chloride 98 - 111 mmol/L 105  110  101   CO2 22 - 32 mmol/L 25  23  24    Calcium 8.9 - 10.3 mg/dL 9.8  9.4  9.4   Total Protein 6.5 - 8.1 g/dL 7.0  6.7  7.6   Total Bilirubin 0.0 - 1.2 mg/dL 0.3  0.3  1.0   Alkaline  Phos 38 - 126 U/L 59  85  61   AST 15 - 41 U/L 22  20  33   ALT 0 - 44 U/L 25  36  33    MULTIPLE MYELOMA 07/2022 - 01/2024   RADIOGRAPHIC STUDIES: I have personally reviewed the radiological images as listed and agreed with the findings in the report. DG BONE DENSITY (DXA) Result Date: 04/19/2024 EXAM: DUAL X-RAY ABSORPTIOMETRY (DXA) FOR BONE MINERAL DENSITY 04/19/2024 11:18 am CLINICAL DATA:  77 year old Female Postmenopausal. OSTEOPENIA TECHNIQUE: An axial (e.g., hips, spine) and/or appendicular (e.g., radius) exam was performed, as appropriate, using GE Secretary/administrator at [employer]. Images are obtained for bone mineral density measurement and are not obtained for diagnostic purposes. [id number] Exclusions: None. COMPARISON:  None. FINDINGS: Scan quality: Good. LUMBAR SPINE (L1-L4): BMD (in g/cm2): 1.163 T-score: -0.3 Z-score: 0.7 LEFT FEMORAL NECK: BMD (in g/cm2): 0.915 T-score: -0.9 Z-score: 0.2 LEFT TOTAL HIP: BMD (in g/cm2): 0.981 T-score: -0.2 Z-score: 0.6 RIGHT FEMORAL NECK: BMD (in g/cm2): 0.914 T-score: -0.9 Z-score: 0.2 RIGHT TOTAL HIP: BMD (in g/cm2): 0.970 T-score: -0.3 Z-score: 0.5 FRAX 10-YEAR PROBABILITY OF FRACTURE: FRAX not reported as the lowest BMD is not in the osteopenia range. IMPRESSION: Normal based on BMD. Fracture risk is not increased. RECOMMENDATIONS: 1. All patients should optimize calcium and vitamin D  intake. 2. Consider FDA-approved medical therapies in postmenopausal women and men aged 59 years and older, based on the following: - A hip or vertebral (clinical or morphometric) fracture - T-score less than or equal to -2.5 and secondary causes have been excluded. - Low bone mass (T-score between -1.0 and -2.5) and a 10-year probability of a hip fracture greater than or equal to 3% or a 10-year probability  of a major osteoporosis-related fracture greater than or equal to 20% based on the [place] -adapted WHO algorithm. - Clinician judgment and/or  patient preferences may indicate treatment for people with 10-year fracture probabilities above or below these levels 3. Patients with diagnosis of osteoporosis or at high risk for fracture should have regular bone mineral density tests. For patients eligible for Medicare, routine testing is allowed once every 2 years. The testing frequency can be increased to one year for patients who have rapidly progressing disease, those who are receiving or discontinuing medical therapy to restore bone mass, or have additional risk factors. Electronically Signed   By: Dina  Arceo M.D.   On: 04/19/2024 13:39   MM 3D SCREENING MAMMOGRAM BILATERAL BREAST Result Date: 03/17/2024 CLINICAL DATA:  Screening. EXAM: DIGITAL SCREENING BILATERAL MAMMOGRAM WITH TOMOSYNTHESIS AND CAD TECHNIQUE: Bilateral screening digital craniocaudal and mediolateral oblique mammograms were obtained. Bilateral screening digital breast tomosynthesis was performed. The images were evaluated with computer-aided detection. COMPARISON:  Previous exam(s). ACR Breast Density Category b: There are scattered areas of fibroglandular density. FINDINGS: There are no findings suspicious for malignancy. IMPRESSION: No mammographic evidence of malignancy. A result letter of this screening mammogram will be mailed directly to the patient. RECOMMENDATION: Screening mammogram in one year. (Code:SM-B-01Y) BI-RADS CATEGORY  1: Negative. Electronically Signed   By: Rosina Gelineau M.D.   On: 03/17/2024 09:56     ASSESSMENT & PLAN:  78 y.o. female with  #1 IgG kappa multiple myeloma.   Initially presented as smoldering myeloma and had M spike of 1.9 g/dL which was noted to be IgG kappa on immunofixation electrophoresis. -Bone survey shows no overt evidence of osseous involvement suggestive of multiple myeloma. Beta 2 microglobulin, sedimentation rate an LDH levels are within normal limits. -Bone marrow biopsy shows previously showed 17% monoclonal plasma  cells Cytogenetics/MolCy - trisomy 11 (consistent with myeloma)   -Bone study from 09/27/17 showed some osteopenic changes without osteoporosis. Previous bone studies are not yet available  -09/08/2019 FISH Panel revealed No Mutations Detected. -09/08/2019 Cytogenetics revealed Normal female karyotype. -09/08/2019 BM Bx Report ([id number])  revealed BONE MARROW, ASPIRATE, CLOT, CORE: -Hypercellular bone marrow for age with plasma cell neoplasm PERIPHERAL BLOOD: -Macrocytic anemia -Leukopenia. - 20% Plasma Cells  -09/15/2019 PET/CT ([id number]) revealed 1. No findings of active myeloma or other hypermetabolic malignancy. 2.  Aortic Atherosclerosis (ICD10-I70.0). 3. Photopenic hypodense hepatic lesions most compatible with cysts.   A treatment initiation active Multiple myeloma Bone Marrow Biopsy with nearly 60% plasma cells. With M spike of 3.6g/dl Starting to develop mild anemia (does not meet anemia criteria yet) No hypercalcemia, renal insufficiency  PET/CT 07/31/2020 - No abnormal hypermetabolism in the neck, chest, abdomen or pelvis.   -PET CT done 07/18/2021 - No hypermetabolic bone lesions suggestive of multiple myeloma.   #2  Chronic leukopenia with intermittent neutropenia. -B12 and folate levels within normal limits. -Reasonable to take a daily vitamin B complex tablet. -No indication for G-CSF at this time. -Now also additionally possibly due to Revlimid .   #3.  Mild anemia and leukopenia likely related to myeloma and Revlimid .   #4 rh/o influenza A infection in February 2023 -Patient's influenza A URI has resolved.  She was given Augmentin  for secondary bacterial infection by her primary care physician.   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  Status post antibiotics.  Resolved symptoms.   PLAN: - Discussed lab  results on 05/17/2024 in detail with patient: CBC showed WBC of 2.9K decreased from 4.0K, Hemoglobin of 12.2 increased from 11.2, and PLTs of 195K. CMP stable.  - On Multiple Myeloma Panel in August, M protein undetected. -no notable toxicities from maintenance Revlimid  at this time. Will continue maintenance Revlimid  at 10mg  3w on 1 week off. - Recommended using steam therapy for congestion.  - Recommend using SI Brace for lower back pain.   Offered imaging to evaluate.   FOLLOW-UP  XR of left SI pain /left hip and pelvis today RTC with Dr Onesimo with labs in 3 months  The total time spent in the appointment was 30 minutes* .  All of the patient's questions were answered and the patient knows to call the clinic with any problems, questions, or concerns.  Emaline Onesimo MD MS AAHIVMS [facility] Hematology/Oncology Physician [facility]  *Total Encounter Time as defined by the Centers for Medicare and Medicaid Services includes, in addition to the face-to-face time of a patient visit (documented in the note above) non-face-to-face time: obtaining and reviewing outside history, ordering and reviewing medications, tests or procedures, care coordination (communications with other health care professionals or caregivers) and documentation in the medical record.  I,Emily Lagle,acting as a neurosurgeon for Emaline Onesimo, MD.,have documented all relevant documentation on the behalf of Emaline Onesimo, MD,as directed by  Emaline Onesimo, MD while in the presence of Emaline Onesimo, MD.  I have reviewed the above documentation for accuracy and completeness, and I agree with the above.  Emaline Onesimo, MD  ADDENDUM  .DG HIP UNILAT WITH PELVIS 2-3 VIEWS LEFT Result Date: 05/21/2024 CLINICAL DATA:  Left sacroiliac and posterior pain. History of multiple myeloma. EXAM: DG HIP (WITH OR WITHOUT PELVIS) 2-3V LEFT COMPARISON:  None Available. FINDINGS: There is no evidence of acute fracture or dislocation.  Mild-to-moderate degenerative changes are noted at the hips bilaterally. Degenerative changes are seen at the sacroiliac joints and lower lumbar spine. No lytic or destructive lesion is seen in the bones. IMPRESSION: 1. No acute or suspicious osseous abnormality. 2. Moderate degenerative changes at the hips bilaterally. 3. Degenerative changes at the sacroiliac joints and lower lumbar spine. Electronically Signed   By: Leita Birmingham M.D.   On: 05/21/2024 16:45   DG Sacrum/Coccyx Result Date: 05/21/2024 CLINICAL DATA:  Left hip pain. EXAM: DG SACRUM/COCCYX 1-2+V COMPARISON:  None Available. FINDINGS: There is no evidence of fracture or other focal bone lesions. IMPRESSION: Negative. Electronically Signed   By: Vanetta Chou M.D.   On: 05/21/2024 16:34   Degenerative changes in the SI joints and lower lumbar spine. No evidence of tumor or other

## 2024-05-19 LAB — MULTIPLE MYELOMA PANEL, SERUM
Albumin SerPl Elph-Mcnc: 3.7 g/dL (ref 2.9–4.4)
Albumin/Glob SerPl: 1.3 (ref 0.7–1.7)
Alpha 1: 0.2 g/dL (ref 0.0–0.4)
Alpha2 Glob SerPl Elph-Mcnc: 0.8 g/dL (ref 0.4–1.0)
B-Globulin SerPl Elph-Mcnc: 0.9 g/dL (ref 0.7–1.3)
Gamma Glob SerPl Elph-Mcnc: 1.2 g/dL (ref 0.4–1.8)
Globulin, Total: 3 g/dL (ref 2.2–3.9)
IgA: 125 mg/dL (ref 64–422)
IgG (Immunoglobin G), Serum: 1233 mg/dL (ref 586–1602)
IgM (Immunoglobulin M), Srm: 96 mg/dL (ref 26–217)
Total Protein ELP: 6.7 g/dL (ref 6.0–8.5)

## 2024-05-29 ENCOUNTER — Other Ambulatory Visit: Payer: Self-pay | Admitting: Hematology

## 2024-05-29 DIAGNOSIS — C9001 Multiple myeloma in remission: Secondary | ICD-10-CM

## 2024-05-31 ENCOUNTER — Other Ambulatory Visit: Payer: Self-pay | Admitting: Hematology

## 2024-05-31 DIAGNOSIS — C9000 Multiple myeloma not having achieved remission: Secondary | ICD-10-CM

## 2024-06-07 ENCOUNTER — Other Ambulatory Visit: Payer: Self-pay | Admitting: Hematology

## 2024-06-07 DIAGNOSIS — C9000 Multiple myeloma not having achieved remission: Secondary | ICD-10-CM

## 2024-06-21 ENCOUNTER — Other Ambulatory Visit: Payer: Self-pay | Admitting: Hematology

## 2024-06-21 DIAGNOSIS — C9001 Multiple myeloma in remission: Secondary | ICD-10-CM

## 2024-06-24 DIAGNOSIS — J40 Bronchitis, not specified as acute or chronic: Secondary | ICD-10-CM | POA: Diagnosis not present

## 2024-06-24 DIAGNOSIS — R202 Paresthesia of skin: Secondary | ICD-10-CM | POA: Diagnosis not present

## 2024-06-26 ENCOUNTER — Telehealth: Payer: Self-pay

## 2024-06-26 NOTE — Telephone Encounter (Signed)
 T/C from pt stating she went to the [facility] for c/o cold hands are swelling, both her legs and arms feeling like pins and needles. Also, chest congestion and dry cough.  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  She is also having bone pain in her left foot making it hard for her to walk. Pt advised to see her PCP. She made an appt for today at 2:00

## 2024-07-10 ENCOUNTER — Other Ambulatory Visit: Payer: Self-pay | Admitting: Hematology

## 2024-07-10 DIAGNOSIS — C9000 Multiple myeloma not having achieved remission: Secondary | ICD-10-CM

## 2024-07-11 ENCOUNTER — Telehealth: Payer: Self-pay

## 2024-07-11 NOTE — Telephone Encounter (Signed)
 Oral Oncology Patient Advocate Encounter  Was successful in securing patient a $8000 grant from [facility] to provide copayment coverage for Revlimid .  This will keep the out of pocket expense at $0.     [employer] ID: [id number]    The billing information is as follows and has been shared with [facility].    RxBin: [id number] PCN: [id number] Member ID: [id number] Group ID: [id number] Dates of Eligibility: 06/11/24 through 06/10/25  Fund:  MM  Lucie Lamer, CPhT [place]  [facility] Oncology Pharmacy Patient Advocate Specialist [facility] [place] Phone: [phone]  Fax: [phone] [email or web address]

## 2024-07-13 ENCOUNTER — Other Ambulatory Visit: Payer: Self-pay | Admitting: Hematology

## 2024-07-13 DIAGNOSIS — C9000 Multiple myeloma not having achieved remission: Secondary | ICD-10-CM

## 2024-07-17 ENCOUNTER — Other Ambulatory Visit: Payer: Self-pay | Admitting: Hematology

## 2024-07-17 DIAGNOSIS — C9000 Multiple myeloma not having achieved remission: Secondary | ICD-10-CM

## 2024-07-21 ENCOUNTER — Other Ambulatory Visit: Payer: Self-pay | Admitting: Hematology

## 2024-07-21 DIAGNOSIS — C9001 Multiple myeloma in remission: Secondary | ICD-10-CM

## 2024-07-26 ENCOUNTER — Telehealth: Payer: Self-pay

## 2024-07-26 NOTE — Telephone Encounter (Signed)
 Oral Oncology Patient Advocate Encounter  I gave them verbally updated [employer] grant information, her copay is now $0.  Lucie Lamer, CPhT [place]  [facility] Oncology Pharmacy Patient Advocate Specialist [facility] [place] [place] Phone: [phone]  Fax: [phone] [email or web address]

## 2024-07-27 ENCOUNTER — Encounter: Payer: Self-pay | Admitting: Podiatry

## 2024-07-27 ENCOUNTER — Ambulatory Visit: Admitting: Podiatry

## 2024-07-27 DIAGNOSIS — F325 Major depressive disorder, single episode, in full remission: Secondary | ICD-10-CM | POA: Insufficient documentation

## 2024-07-27 DIAGNOSIS — B351 Tinea unguium: Secondary | ICD-10-CM

## 2024-07-27 DIAGNOSIS — M79674 Pain in right toe(s): Secondary | ICD-10-CM

## 2024-07-27 DIAGNOSIS — G620 Drug-induced polyneuropathy: Secondary | ICD-10-CM | POA: Insufficient documentation

## 2024-07-27 DIAGNOSIS — D709 Neutropenia, unspecified: Secondary | ICD-10-CM | POA: Insufficient documentation

## 2024-07-27 DIAGNOSIS — K219 Gastro-esophageal reflux disease without esophagitis: Secondary | ICD-10-CM | POA: Insufficient documentation

## 2024-07-27 DIAGNOSIS — L84 Corns and callosities: Secondary | ICD-10-CM

## 2024-07-27 DIAGNOSIS — D849 Immunodeficiency, unspecified: Secondary | ICD-10-CM | POA: Insufficient documentation

## 2024-07-27 DIAGNOSIS — E119 Type 2 diabetes mellitus without complications: Secondary | ICD-10-CM | POA: Insufficient documentation

## 2024-07-27 DIAGNOSIS — I10 Essential (primary) hypertension: Secondary | ICD-10-CM | POA: Insufficient documentation

## 2024-07-27 DIAGNOSIS — J309 Allergic rhinitis, unspecified: Secondary | ICD-10-CM | POA: Insufficient documentation

## 2024-07-27 DIAGNOSIS — E785 Hyperlipidemia, unspecified: Secondary | ICD-10-CM | POA: Insufficient documentation

## 2024-07-27 DIAGNOSIS — M79675 Pain in left toe(s): Secondary | ICD-10-CM | POA: Diagnosis not present

## 2024-07-27 DIAGNOSIS — E1142 Type 2 diabetes mellitus with diabetic polyneuropathy: Secondary | ICD-10-CM

## 2024-07-27 NOTE — Progress Notes (Signed)
"   °  °  Subjective:  Patient ID: Tracy Fisher, female    DOB: [birth year],  MRN: [id number]  Tracy Fisher presents to clinic today for:  Chief Complaint  Patient presents with   [facility]    [facility] nail trim. R 5th sub met small skin lesion. Glucose 106 Nov 2025. 81 mg Asprin.   Patient notes nails are thick, discolored, elongated and painful in shoegear when trying to ambulate.  She is diabetic. Complains of callus right sub 5th metatarsal head.  PCP is Teresa Channel, MD.  Past Medical History:  Diagnosis Date   Bone cancer (HCC) 2018   started chemo pill in  2018   Diabetes mellitus without complication (HCC)    Hypertension    Vocal cord cyst    Past Surgical History:  Procedure Laterality Date   NO PAST SURGERIES     Allergies  Allergen Reactions   Bortezomib  Other (See Comments)    paresthesias to bilateral hands and feet.  Soreness/neuropathy  Other Reaction(s): neuropathy   Sulfa Antibiotics Itching and Other (See Comments)   Sulfamethoxazole     Other Reaction(s): itching/ break out    Review of Systems: Negative except as noted in the HPI.  Objective:  Tracy Fisher is a pleasant 79 y.o. female in NAD. AAO x 3.  Vascular Examination: Capillary refill time is 3-5 seconds to toes bilateral. Palpable pedal pulses b/l LE. Digital hair decreased b/l.  Skin temperature gradient WNL b/l. No varicosities b/l. No cyanosis noted b/l.   Dermatological Examination: Pedal skin with normal turgor, texture and tone b/l. No open wounds. No interdigital macerations b/l. Toenails x10 are 3mm thick, discolored, dystrophic with subungual debris. There is pain with compression of the nail plates.  They are elongated x10. Preulcerative callus right sub 5th metatarsal head.  Neurological Examination: Decreased light touch sensation to toes. Reports subjective paraesthesias. Decreased vibratory sensation.  Assessment/Plan: 1. Pain due to onychomycosis of toenails of both feet    2. Type 2 diabetes mellitus with peripheral neuropathy (HCC)   3. Callus     The mycotic toenails were sharply debrided x10 with sterile nail nippers and a power debriding burr to decrease bulk/thickness and length.    #Preulcerative callus right sub 5th metatarsal head All symptomatic hyperkeratoses were safely debrided with a sterile #15 blade to patient's level of comfort without incident. We discussed preventative and palliative care of these lesions including supportive and accommodative shoegear, padding, prefabricated and custom molded accommodative orthoses, use of a pumice stone and lotions/creams daily.   Return in about 3 months (around 10/25/2024) for Diabetic Foot Care.   Tyrick Dunagan L. Lamount MAUL, AACFAS [facility]     [address] [place], [place] [address]                Office [phone]  Fax [phone]   "

## 2024-08-16 ENCOUNTER — Inpatient Hospital Stay: Attending: Hematology

## 2024-08-16 ENCOUNTER — Inpatient Hospital Stay: Admitting: Hematology

## 2024-10-26 ENCOUNTER — Ambulatory Visit: Admitting: Podiatry
